# Patient Record
Sex: Male | Born: 1950 | Race: White | Hispanic: No | Marital: Married | Smoking: Former smoker
Health system: Southern US, Community
[De-identification: ages and names within clinical notes are randomized; demographics above are authoritative.]

## PROBLEM LIST (undated history)

## (undated) DIAGNOSIS — M549 Dorsalgia, unspecified: Secondary | ICD-10-CM

## (undated) DIAGNOSIS — R7303 Prediabetes: Secondary | ICD-10-CM

## (undated) DIAGNOSIS — G4730 Sleep apnea, unspecified: Secondary | ICD-10-CM

## (undated) DIAGNOSIS — T884XXA Failed or difficult intubation, initial encounter: Secondary | ICD-10-CM

## (undated) DIAGNOSIS — E785 Hyperlipidemia, unspecified: Secondary | ICD-10-CM

## (undated) DIAGNOSIS — I714 Abdominal aortic aneurysm, without rupture, unspecified: Secondary | ICD-10-CM

## (undated) DIAGNOSIS — M1710 Unilateral primary osteoarthritis, unspecified knee: Secondary | ICD-10-CM

## (undated) DIAGNOSIS — M4800 Spinal stenosis, site unspecified: Secondary | ICD-10-CM

## (undated) DIAGNOSIS — T7840XA Allergy, unspecified, initial encounter: Secondary | ICD-10-CM

## (undated) DIAGNOSIS — R3129 Other microscopic hematuria: Secondary | ICD-10-CM

## (undated) DIAGNOSIS — N400 Enlarged prostate without lower urinary tract symptoms: Secondary | ICD-10-CM

## (undated) DIAGNOSIS — K219 Gastro-esophageal reflux disease without esophagitis: Secondary | ICD-10-CM

## (undated) DIAGNOSIS — R972 Elevated prostate specific antigen [PSA]: Secondary | ICD-10-CM

## (undated) DIAGNOSIS — D472 Monoclonal gammopathy: Secondary | ICD-10-CM

## (undated) DIAGNOSIS — H269 Unspecified cataract: Secondary | ICD-10-CM

## (undated) DIAGNOSIS — D649 Anemia, unspecified: Secondary | ICD-10-CM

## (undated) DIAGNOSIS — G8929 Other chronic pain: Secondary | ICD-10-CM

## (undated) DIAGNOSIS — G5601 Carpal tunnel syndrome, right upper limb: Secondary | ICD-10-CM

## (undated) DIAGNOSIS — R1010 Upper abdominal pain, unspecified: Secondary | ICD-10-CM

## (undated) DIAGNOSIS — I10 Essential (primary) hypertension: Secondary | ICD-10-CM

## (undated) DIAGNOSIS — M1990 Unspecified osteoarthritis, unspecified site: Secondary | ICD-10-CM

## (undated) DIAGNOSIS — J302 Other seasonal allergic rhinitis: Secondary | ICD-10-CM

## (undated) DIAGNOSIS — M179 Osteoarthritis of knee, unspecified: Secondary | ICD-10-CM

## (undated) DIAGNOSIS — J189 Pneumonia, unspecified organism: Secondary | ICD-10-CM

## (undated) DIAGNOSIS — Z789 Other specified health status: Secondary | ICD-10-CM

## (undated) HISTORY — PX: KNEE ARTHROSCOPY: SUR90

## (undated) HISTORY — PX: EYE SURGERY: SHX253

## (undated) HISTORY — DX: Elevated prostate specific antigen (PSA): R97.20

## (undated) HISTORY — PX: LATERAL FUSION LUMBAR SPINE: SUR631

## (undated) HISTORY — DX: Prediabetes: R73.03

## (undated) HISTORY — PX: CATARACT EXTRACTION, BILATERAL: SHX1313

## (undated) HISTORY — PX: CARPAL TUNNEL RELEASE: SHX101

## (undated) HISTORY — DX: Other chronic pain: G89.29

## (undated) HISTORY — PX: JOINT REPLACEMENT: SHX530

## (undated) HISTORY — PX: ELBOW SURGERY: SHX618

## (undated) HISTORY — DX: Hyperlipidemia, unspecified: E78.5

## (undated) HISTORY — DX: Osteoarthritis of knee, unspecified: M17.9

## (undated) HISTORY — PX: TONSILLECTOMY: SUR1361

## (undated) HISTORY — DX: Allergy, unspecified, initial encounter: T78.40XA

## (undated) HISTORY — DX: Unilateral primary osteoarthritis, unspecified knee: M17.10

## (undated) HISTORY — DX: Other microscopic hematuria: R31.29

## (undated) HISTORY — DX: Spinal stenosis, site unspecified: M48.00

## (undated) HISTORY — PX: COLONOSCOPY: SHX174

## (undated) HISTORY — DX: Other specified health status: Z78.9

## (undated) HISTORY — DX: Carpal tunnel syndrome, right upper limb: G56.01

## (undated) HISTORY — DX: Upper abdominal pain, unspecified: R10.10

## (undated) HISTORY — PX: WISDOM TOOTH EXTRACTION: SHX21

## (undated) HISTORY — DX: Other chronic pain: M54.9

## (undated) HISTORY — PX: SHOULDER ARTHROSCOPY: SHX128

## (undated) SURGERY — Surgical Case
Anesthesia: *Unknown

---

## 2008-01-10 HISTORY — PX: COLONOSCOPY: SHX174

## 2008-01-10 HISTORY — PX: OTHER SURGICAL HISTORY: SHX169

## 2008-02-19 ENCOUNTER — Ambulatory Visit (HOSPITAL_BASED_OUTPATIENT_CLINIC_OR_DEPARTMENT_OTHER): Admission: RE | Admit: 2008-02-19 | Discharge: 2008-02-19 | Payer: Self-pay | Admitting: Orthopedic Surgery

## 2008-03-16 ENCOUNTER — Ambulatory Visit (HOSPITAL_BASED_OUTPATIENT_CLINIC_OR_DEPARTMENT_OTHER): Admission: RE | Admit: 2008-03-16 | Discharge: 2008-03-16 | Payer: Self-pay | Admitting: Orthopedic Surgery

## 2008-03-31 ENCOUNTER — Encounter: Admission: RE | Admit: 2008-03-31 | Discharge: 2008-03-31 | Payer: Self-pay | Admitting: Orthopedic Surgery

## 2008-04-15 ENCOUNTER — Ambulatory Visit: Payer: Self-pay | Admitting: Internal Medicine

## 2008-04-24 ENCOUNTER — Encounter: Payer: Self-pay | Admitting: Internal Medicine

## 2008-04-24 ENCOUNTER — Ambulatory Visit: Payer: Self-pay | Admitting: Internal Medicine

## 2008-04-27 ENCOUNTER — Encounter: Payer: Self-pay | Admitting: Internal Medicine

## 2008-08-09 DEATH — deceased

## 2010-04-21 LAB — POCT HEMOGLOBIN-HEMACUE: Hemoglobin: 14.5 g/dL (ref 13.0–17.0)

## 2010-04-26 LAB — POCT HEMOGLOBIN-HEMACUE: Hemoglobin: 14.1 g/dL (ref 13.0–17.0)

## 2010-05-24 NOTE — Op Note (Signed)
NAME:  Walter White, Walter White NO.:  [id number]   MEDICAL RECORD NO.:  [id number]          PATIENT TYPE:  AMB   LOCATION:  DSC                          FACILITY:  [facility]   PHYSICIAN:  Feliberto Gottron. Turner Daniels, M.D.   DATE OF BIRTH:  06/10/1950   DATE OF PROCEDURE:  03/16/2008  DATE OF DISCHARGE:                               OPERATIVE REPORT   PREOPERATIVE DIAGNOSIS:  Right knee medial meniscal tear.   POSTOPERATIVE DIAGNOSES:  1. Right knee medial meniscal tear.  2. Chondromalacia medial femoral condyle, grade 3.   PROCEDURES:  1. Right knee arthroscopic partial medial meniscectomy.  2. Debride chondromalacia, grade 3 from the medial femoral condyle.   SURGEON:  Feliberto Gottron. Turner Daniels, MD   FIRST ASSISTANT:  Shirl [name], PA   ANESTHETIC:  General LMA.   ESTIMATED BLOOD LOSS:  Minimal.   FLUID REPLACEMENT:  800 mL of crystalloid.   DRAINS PLACED:  None.   TOURNIQUET TIME:  None.   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  He has medial joint line pain, catching, popping, and  tenderness.  He had some calcium pyrophosphate crystals on the left  knee, he also has some on the right.  He desires the procedure to  decrease pain and increase function, similar that was done on the left  knee.   DESCRIPTION OF PROCEDURE:  The patient was identified by armband, taken  to the operating room at [facility]  At [facility], appropriate anesthetic monitors were attached and general LMA  anesthesia induced with the patient in supine position.  Lateral post  was applied to the table and the right lower extremity was prepped and  draped in usual sterile fashion from the ankle to the midthigh.  A  standard time-out procedure was then performed.  Using a #11 blade,  standard inferomedial and  inferolateral peripatellar portals were then  made allowing introduction of the arthroscope through the inferolateral  portal and the outflow through the inferomedial portal.  Diagnostic  arthroscopy revealed a relatively normal patellofemoral joint.  A little  bit of grade 2 to grade 3 chondromalacia to the lateral facet of the  patella and this was lightly debrided.  Moving in the medial side, the  patient did have a fairly impressive complex tear of the posterior and  medial horns of the medial meniscus.  This was debrided back to a stable  margin with a 3.5 Gator sucker shaver and a small biter.  We also  identified chondromalacia in the medial femoral condyle, grade 3 and  this was debrided with a 3.5 Gator sucker shaver as well.  The ACL and  PCL were intact on the lateral side, the articular and missing meniscal  cartilages were in excellent condition.  The gutters were cleared  medially and laterally.  The arthroscopic instruments were removed and a  dressing of Xeroform 4 x 4 dressing, sponges, Webril,  and Ace wrap  applied.  The patient was awakened and taken to the recovery room  without difficulty.      Feliberto Gottron. Turner Daniels, M.D.  Electronically Signed     FJR/MEDQ  D:  03/16/2008  T:  03/17/2008  Job:  [id number]

## 2010-05-24 NOTE — Op Note (Signed)
NAME:  Walter White, Walter White NO.:  [id number]   MEDICAL RECORD NO.:  [id number]          PATIENT TYPE:  AMB   LOCATION:  DSC                          FACILITY:  [facility]   PHYSICIAN:  Feliberto Gottron. Turner Daniels, M.D.   DATE OF BIRTH:  1950-07-21   DATE OF PROCEDURE:  02/19/2008  DATE OF DISCHARGE:                               OPERATIVE REPORT   PREOPERATIVE DIAGNOSIS:  Left knee medial meniscal tear.   POSTOPERATIVE DIAGNOSIS:  Left knee medial meniscal tear; chondromalacia  medial femoral condyle, focal grade 3; medial tibial plateau, focal  grade 3; partial anterior cruciate ligament tear with calcifications  laterally; and calcium pyrophosphate in the lateral meniscus as well.   PROCEDURE:  Left knee arthroscopic partial medial meniscectomy; removal  of calcium pyrophosphate deposits from the knee; debridement of partial-  thickness tear, anterior cruciate ligament; and debridement of  chondromalacia medial femoral condyle, focal and medial tibial plateau,  focal grade 3.   SURGEON:  Feliberto Gottron. Turner Daniels, MD   FIRST ASSISTANT:  None.   ANESTHETIC:  General LMA.   ESTIMATED BLOOD LOSS:  Minimal.   FLUID REPLACEMENT:  800 mL of crystalloid.   DRAINS PLACED:  None.   TOURNIQUET TIME:  None.   INDICATIONS FOR PROCEDURE:  A 60 year old gentleman with intermittent  catching, popping, and pain in his left knee with mechanical symptoms  that have gone on for a number of years.  He has failed conservative  measures, desires elective arthroscopic evaluation and treatment of his  left knee.  Risks and benefits of surgery discussed, questions answered.   DESCRIPTION OF PROCEDURE:  The patient was identified by armband and  taken to the operating room at [facility] where the  appropriate anesthetic monitors were attached and general LMA anesthesia  induced with the patient in supine position.  Lateral post applied to  the table in the left lower extremity prepped and  draped in usual  sterile fashion from the ankle to the midthigh.  Standard time-out  procedure was then performed after which we made standard inferomedial  and inferolateral peripatellar portals using a #11 blade, the  arthroscope was introduced to the inferolateral portal and the pump  pressure set between 14 and 80 mmHg.  Diagnostic arthroscopy revealed a  fairly normal suprapatellar pouch, patella and trochlea moving to the  medial side from about the mid medial meniscus to the posterior horn.  There was an extensive complex tearing in the medial meniscus.  This was  debrided with a 3.5 Gator sucker shaver as well as straight small  biters.  We also encountered areas of grade 3 chondromalacia of the  medial femoral condyle and grade medial tibial plateau about 45 mm in  diameter and these were smoothed out and debrided back to stable margins  with the Gator sucker shaver.  We also noted deposits of calcium  pyrophosphate in the posterior horn of the medial meniscus as well as  throughout the ACL and on the lateral side of the ACL about 10% of  fibers were torn, interposed between the lateral femoral condyle,  and  lateral tibial plateau.  These were removed with the 3.5 Gator sucker  shaver as were some calcium deposits in the posterior horn of the  lateral meniscus.  The gutters were cleared medially and laterally.  The  knee irrigated out with normal saline solution and the arthroscopic  instruments removed and a dressing of Xeroform, 4x4 dressing, sponges,  Webril, and Ace wrap were applied.  The patient was then awakened,  extubated, and taken to the recovery room without difficulty.      Feliberto Gottron. Turner Daniels, M.D.  Electronically Signed     FJR/MEDQ  D:  02/19/2008  T:  02/19/2008  Job:  [id number]

## 2010-11-22 ENCOUNTER — Encounter (HOSPITAL_BASED_OUTPATIENT_CLINIC_OR_DEPARTMENT_OTHER): Payer: Self-pay | Admitting: *Deleted

## 2010-11-22 NOTE — Progress Notes (Signed)
Pt deies any ht or resp problems or sleep apnea

## 2010-11-22 NOTE — H&P (Signed)
  HISTORY OF PRESENT ILLNESS:  Mr. Walter White is here for followup of his L Knee.  He saw Dr. Althea Charon on 03 October 2010 and they aspirated out about 115 mL of blood-tinged fluid that had a slightly yellow hue.  This was sent off for a cell count; 33,000 cells, gram stain and culture came back with white cells.  Cultures are negative.  He has had no fevers or chills.  He does have a long history of pseudogout and he has actually had this knee scoped once or twice in the past by me.  PAST MEDICAL HISTORY: MEDICAL PROBLEMS:  Hyperlipidemia, allergic rhinitis, and osteoarthritis of both knees.  PAST SURGICAL HISTORY:  Right knee arthroscopy in 2010 and left knee arthroscopy x2 in 1990 and 2010.   SOCIAL HISTORY:  He is married.  He is a former smoker who quit about 27 years ago.  He drinks alcohol rarely.  REVIEW OF SYSTEMS: Review of systems reviewed thoroughly and all other systems are negative as related to the chief complaint.  EXAM:  He has re-accumulated part of his effusion.  His range of motion is about 5 to 120 degrees.  Collateral ligaments are stable.  Generally he is a well-nourished, well-developed 60 year old gentleman seated on the exam table in no apparent distress.  No shortness of breath.  His skin is intact. The final labs have been reviewed with the patient.  Options were discussed at length.  RADIOGRAPHS:  Other reviewed images:  Reviewing his past x-rays he does have pseudogout with calcium phosphate crystals in the cartilages that you can visualize on the x-ray.  IMPRESSION/PROCEDURE:  After discussing his options I went ahead and sterilely prepped his left knee, aspirated out 30 mL of fairly normal-appearing joint fluid with a little bit of fibrinous material in the aspirate.  I injected him with 3 mL of Xylocaine, 3 mL of Marcaine, and 3 mL of Celestone. Excellent pain relief within 5 minutes.  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 Risk\Benefits discussed.

## 2010-11-23 ENCOUNTER — Ambulatory Visit (HOSPITAL_BASED_OUTPATIENT_CLINIC_OR_DEPARTMENT_OTHER): Payer: BC Managed Care – PPO | Admitting: Anesthesiology

## 2010-11-23 ENCOUNTER — Encounter (HOSPITAL_BASED_OUTPATIENT_CLINIC_OR_DEPARTMENT_OTHER): Payer: Self-pay | Admitting: Anesthesiology

## 2010-11-23 ENCOUNTER — Other Ambulatory Visit: Payer: Self-pay

## 2010-11-23 ENCOUNTER — Encounter (HOSPITAL_BASED_OUTPATIENT_CLINIC_OR_DEPARTMENT_OTHER)
Admission: RE | Disposition: A | Discharge status: Home / Self Care | Payer: Self-pay | Source: Ambulatory Visit | Attending: Orthopedic Surgery

## 2010-11-23 ENCOUNTER — Ambulatory Visit (HOSPITAL_BASED_OUTPATIENT_CLINIC_OR_DEPARTMENT_OTHER)
Admission: RE | Admit: 2010-11-23 | Discharge: 2010-11-23 | Disposition: A | Discharge status: Home / Self Care | Payer: BC Managed Care – PPO | Source: Ambulatory Visit | Attending: Orthopedic Surgery | Admitting: Orthopedic Surgery

## 2010-11-23 ENCOUNTER — Encounter (HOSPITAL_BASED_OUTPATIENT_CLINIC_OR_DEPARTMENT_OTHER): Payer: Self-pay | Admitting: *Deleted

## 2010-11-23 DIAGNOSIS — M23305 Other meniscus derangements, unspecified medial meniscus, unspecified knee: Secondary | ICD-10-CM | POA: Insufficient documentation

## 2010-11-23 DIAGNOSIS — S83242A Other tear of medial meniscus, current injury, left knee, initial encounter: Secondary | ICD-10-CM

## 2010-11-23 DIAGNOSIS — M9420 Chondromalacia, unspecified site: Secondary | ICD-10-CM | POA: Insufficient documentation

## 2010-11-23 DIAGNOSIS — M2340 Loose body in knee, unspecified knee: Secondary | ICD-10-CM | POA: Insufficient documentation

## 2010-11-23 HISTORY — DX: Unspecified osteoarthritis, unspecified site: M19.90

## 2010-11-23 HISTORY — PX: KNEE ARTHROSCOPY: SHX127

## 2010-11-23 LAB — POCT HEMOGLOBIN-HEMACUE: Hemoglobin: 13.1 g/dL (ref 13.0–17.0)

## 2010-11-23 SURGERY — ARTHROSCOPY, KNEE
Anesthesia: Choice | Laterality: Left

## 2010-11-23 MED ORDER — MIDAZOLAM HCL 5 MG/5ML IJ SOLN
INTRAMUSCULAR | Status: DC | PRN
  Administered 2010-11-23: 2 mg via INTRAVENOUS

## 2010-11-23 MED ORDER — BUPIVACAINE HCL (PF) 0.5 % IJ SOLN
INTRAMUSCULAR | Status: DC | PRN
  Administered 2010-11-23: 20 mL

## 2010-11-23 MED ORDER — SODIUM CHLORIDE 0.9 % IR SOLN
Status: DC | PRN
  Administered 2010-11-23: 3000 mL

## 2010-11-23 MED ORDER — EPINEPHRINE HCL 1 MG/ML IJ SOLN
INTRAMUSCULAR | Status: DC | PRN
  Administered 2010-11-23: 1 mL

## 2010-11-23 MED ORDER — DEXAMETHASONE SODIUM PHOSPHATE 4 MG/ML IJ SOLN
INTRAMUSCULAR | Status: DC | PRN
  Administered 2010-11-23: 10 mg via INTRAVENOUS

## 2010-11-23 MED ORDER — LACTATED RINGERS IV SOLN
INTRAVENOUS | Status: DC
  Administered 2010-11-23: 11:00:00 via INTRAVENOUS

## 2010-11-23 MED ORDER — FENTANYL CITRATE 0.05 MG/ML IJ SOLN
INTRAMUSCULAR | Status: DC | PRN
  Administered 2010-11-23: 100 ug via INTRAVENOUS

## 2010-11-23 MED ORDER — CHLORHEXIDINE GLUCONATE 4 % EX LIQD: 60.0000 mL | Freq: Once | CUTANEOUS | Status: DC

## 2010-11-23 MED ORDER — ONDANSETRON HCL 4 MG/2ML IJ SOLN
INTRAMUSCULAR | Status: DC | PRN
  Administered 2010-11-23: 4 mg via INTRAVENOUS

## 2010-11-23 MED ORDER — HYDROCODONE-ACETAMINOPHEN 5-325 MG PO TABS: 1.0000 | ORAL_TABLET | ORAL | Status: AC | PRN

## 2010-11-23 MED ORDER — DEXTROSE-NACL 5-0.45 % IV SOLN: INTRAVENOUS | Status: DC

## 2010-11-23 MED ORDER — LIDOCAINE HCL (CARDIAC) 20 MG/ML IV SOLN
INTRAVENOUS | Status: DC | PRN
  Administered 2010-11-23 (×2): 60 mg via INTRAVENOUS

## 2010-11-23 MED ORDER — VANCOMYCIN HCL IN DEXTROSE 1-5 GM/200ML-% IV SOLN
1000.0000 mg | INTRAVENOUS | Status: AC
  Administered 2010-11-23: 1000 mg via INTRAVENOUS

## 2010-11-23 MED ORDER — PROPOFOL 10 MG/ML IV EMUL
INTRAVENOUS | Status: DC | PRN
  Administered 2010-11-23: 200 mg via INTRAVENOUS

## 2010-11-23 SURGICAL SUPPLY — 36 items
BLADE 4.2CUDA (BLADE) IMPLANT
BLADE CUTTER GATOR 3.5 (BLADE) ×2 IMPLANT
BLADE GREAT WHITE 4.2 (BLADE) ×2 IMPLANT
CANISTER OMNI JUG 16 LITER (MISCELLANEOUS) ×2 IMPLANT
CANISTER SUCTION 2500CC (MISCELLANEOUS) IMPLANT
CHLORAPREP W/TINT 26ML (MISCELLANEOUS) ×2 IMPLANT
CLOTH BEACON ORANGE TIMEOUT ST (SAFETY) ×2 IMPLANT
DRAPE ARTHROSCOPY W/POUCH 114 (DRAPES) ×2 IMPLANT
ELECT MENISCUS 165MM 90D (ELECTRODE) IMPLANT
ELECT REM PT RETURN 9FT ADLT (ELECTROSURGICAL)
ELECTRODE REM PT RTRN 9FT ADLT (ELECTROSURGICAL) IMPLANT
GAUZE XEROFORM 1X8 LF (GAUZE/BANDAGES/DRESSINGS) ×2 IMPLANT
GLOVE BIO SURGEON STRL SZ7 (GLOVE) ×2 IMPLANT
GLOVE BIO SURGEON STRL SZ7.5 (GLOVE) ×2 IMPLANT
GLOVE BIOGEL PI IND STRL 7.0 (GLOVE) ×1 IMPLANT
GLOVE BIOGEL PI IND STRL 8 (GLOVE) ×1 IMPLANT
GLOVE BIOGEL PI INDICATOR 7.0 (GLOVE) ×1
GLOVE BIOGEL PI INDICATOR 8 (GLOVE) ×1
GOWN PREVENTION PLUS XLARGE (GOWN DISPOSABLE) ×6 IMPLANT
KNEE WRAP E Z 3 GEL PACK (MISCELLANEOUS) ×2 IMPLANT
NDL SAFETY ECLIPSE 18X1.5 (NEEDLE) ×1 IMPLANT
NEEDLE FILTER BLUNT 18X 1/2SAF (NEEDLE)
NEEDLE FILTER BLUNT 18X1 1/2 (NEEDLE) IMPLANT
NEEDLE HYPO 18GX1.5 SHARP (NEEDLE) ×1
PACK ARTHROSCOPY DSU (CUSTOM PROCEDURE TRAY) ×2 IMPLANT
PACK BASIN DAY SURGERY FS (CUSTOM PROCEDURE TRAY) ×2 IMPLANT
PENCIL BUTTON HOLSTER BLD 10FT (ELECTRODE) IMPLANT
SET ARTHROSCOPY TUBING (MISCELLANEOUS) ×1
SET ARTHROSCOPY TUBING LN (MISCELLANEOUS) ×1 IMPLANT
SLEEVE SCD COMPRESS KNEE MED (MISCELLANEOUS) IMPLANT
SPONGE GAUZE 4X4 12PLY (GAUZE/BANDAGES/DRESSINGS) ×2 IMPLANT
SYR 3ML 18GX1 1/2 (SYRINGE) IMPLANT
SYR 5ML LL (SYRINGE) ×2 IMPLANT
TOWEL OR 17X24 6PK STRL BLUE (TOWEL DISPOSABLE) ×2 IMPLANT
WAND STAR VAC 90 (SURGICAL WAND) IMPLANT
WATER STERILE IRR 1000ML POUR (IV SOLUTION) ×2 IMPLANT

## 2010-11-23 NOTE — Anesthesia Postprocedure Evaluation (Signed)
  Anesthesia Post-op Note  Patient: Walter White  Procedure(s) Performed:  ARTHROSCOPY KNEE - arthroscopy left knee  Patient Location: PACU  Anesthesia Type: General  Level of Consciousness: awake  Airway and Oxygen Therapy: Patient Spontanous Breathing  Post-op Pain: mild  Post-op Assessment: Post-op Vital signs reviewed, Patient's Cardiovascular Status Stable, Respiratory Function Stable, Patent Airway, No signs of Nausea or vomiting, Adequate PO intake and Pain level controlled  Post-op Vital Signs: Reviewed and stable  Complications: No apparent anesthesia complications

## 2010-11-23 NOTE — Interval H&P Note (Signed)
History and Physical Interval Note:   11/23/2010   11:34 AM   Walter White  has presented today for surgery, with the diagnosis of left knee mmt  The various methods of treatment have been discussed with the patient and family. After consideration of risks, benefits and other options for treatment, the patient has consented to  Procedure(s): ARTHROSCOPY KNEE as a surgical intervention .  The patients' history has been reviewed, patient examined, no change in status, stable for surgery.  I have reviewed the patients' chart and labs.  Questions were answered to the patient's satisfaction.     Nestor Lewandowsky  MD

## 2010-11-23 NOTE — Anesthesia Preprocedure Evaluation (Addendum)
Anesthesia Evaluation  Patient identified by MRN, date of birth, ID band Patient awake    Reviewed: Allergy & Precautions, H&P , NPO status , Patient's Chart, lab work & pertinent test results  Airway Mallampati: I TM Distance: >3 FB Neck ROM: Full    Dental No notable dental hx.    Pulmonary    Pulmonary exam normal       Cardiovascular     Neuro/Psych    GI/Hepatic   Endo/Other  Morbid obesity  Renal/GU      Musculoskeletal   Abdominal   Peds  Hematology   Anesthesia Other Findings   Reproductive/Obstetrics                           Anesthesia Physical Anesthesia Plan  ASA: III  Anesthesia Plan: General   Post-op Pain Management:    Induction: Intravenous  Airway Management Planned: LMA  Additional Equipment:   Intra-op Plan:   Post-operative Plan: Extubation in OR  Informed Consent: I have reviewed the patients History and Physical, chart, labs and discussed the procedure including the risks, benefits and alternatives for the proposed anesthesia with the patient or authorized representative who has indicated his/her understanding and acceptance.   Dental advisory given  Plan Discussed with: CRNA and Surgeon  Anesthesia Plan Comments:         Anesthesia Quick Evaluation

## 2010-11-23 NOTE — Op Note (Signed)
NAME:  Walter White, Walter White NO.:  [id number]  MEDICAL RECORD NO.:  [id number]  LOCATION:                                 FACILITY:  PHYSICIAN:  Feliberto Gottron. Turner Daniels, M.D.        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  PROCEDURE:  Left knee arthroscopic partial medial meniscectomy, removal of calcium pyrophosphate crystals, removal of loose bodies, and debridement of chondromalacia.  SURGEON:  Feliberto Gottron. Turner Daniels, MD  FIRST ASSISTANT:  Shirl [name], PA-C  ANESTHETIC:  General LMA.  ESTIMATED BLOOD LOSS:  Minimal.  FLUID REPLACEMENT:  1500 mL of crystalloid.  DRAINS PLACED:  none.  TOURNIQUET TIME:  None.  INDICATIONS FOR PROCEDURE:  The patient is a 60 year old man with catching, popping, and pain in his left knee.  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  He also has had intermittent effusions and as recently a month ago we took up 120 mL of bloody effusion that was culture negative.  He desires elective arthroscopic evaluation treatment of his left knee.  Risks and benefits of surgery were discussed.  Questions answered and he has previously had this procedure done before.  DESCRIPTION OF PROCEDURE:  The patient identified by armband and received preoperative IV antibiotics in the holding area at the [facility].  Taken to operating room 5, appropriate anesthetic monitors were attached.  General LMA anesthesia induced with the patient in a supine position.  The lateral post applied to the table.  Left lower extremity prepped and  draped in sterile fashion from the ankle to the midthigh.  At this point, time-out procedure was performed.  We began the operation by making standard inferomedial and inferolateral peripatellar portals allowing introduction of the arthroscope through the inferolateral portal and the outflow through the inferomedial portal.  Pump pressure was set at 100 mm and diagnostic arthroscopy did reveal intraarticular cartilages as well as calcified loose bodies that were taken through the outflow or with a 3.5 or 4.2 great White sucker shaver.  The patellofemoral joint was in relatively good condition moving to the medial compartment.  Complex degenerative tearing of the medial meniscus was identified.  Started with calcium pyrophosphate crystals.  This was debrided back to a stable margin and thoroughly probed.  The articular cartilage of the medial femoral condyle and medial tibial plateau was in relatively good condition.  The ACL was in relatively good condition as well.  On the lateral compartment, some small loose bodies were found, otherwise this was also in good condition.  The knee was irrigated out with normal saline solution. Arthroscopic instruments were removed.  Dressing of Xeroform, 4 x 4, dressing, sponges, Webril, and Ace wrap applied.  The patient awakened, extubated, and taken to the recovery room without difficulty.  Feliberto Gottron. Turner Daniels, M.D.     Ovid Curd  D:  11/23/2010  T:  11/23/2010  Job:  [id number]

## 2010-11-23 NOTE — Brief Op Note (Signed)
11/23/2010  12:35 PM  PATIENT:  Walter White  60 y.o. male  PRE-OPERATIVE DIAGNOSIS:  left knee mmt  POST-OPERATIVE DIAGNOSIS:  Left Knee Medial Meniscal Tear  PROCEDURE:  Procedure(s): ARTHROSCOPY KNEE  SURGEON:  Surgeon(s): Nestor Lewandowsky  PHYSICIAN ASSISTANT: Mauricia Area  ANESTHESIA:   general  EBL:  Total I/O In: 800 [I.V.:800] Out: -   BLOOD ADMINISTERED:none  DRAINS: none   LOCAL MEDICATIONS USED:  MARCAINE 10CC  SPECIMEN:  No Specimen  DISPOSITION OF SPECIMEN:  N/A  COUNTS:  YES  TOURNIQUET:  * No tourniquets in log *  DICTATION: .Other Dictation: Dictation Number unknown  PLAN OF CARE: Discharge to home after PACU  PATIENT DISPOSITION:  PACU - hemodynamically stable.   Delay start of Pharmacological VTE agent (>24hrs) due to surgical blood loss or risk of bleeding:  {YES/NO/NOT APPLICABLE:20182

## 2010-11-23 NOTE — Transfer of Care (Signed)
Immediate Anesthesia Transfer of Care Note  Patient: Walter White  Procedure(s) Performed:  ARTHROSCOPY KNEE - arthroscopy left knee  Patient Location: PACU  Anesthesia Type: General  Level of Consciousness: sedated  Airway & Oxygen Therapy: Patient Spontanous Breathing and Patient connected to face mask oxygen  Post-op Assessment: Report given to PACU RN and Post -op Vital signs reviewed and stable  Post vital signs: Reviewed and stable  Complications: No apparent anesthesia complications

## 2010-11-23 NOTE — Op Note (Signed)
Op Note Dictated 11/23/10

## 2010-11-23 NOTE — Anesthesia Procedure Notes (Addendum)
Performed by: Signa Kell   Procedure Name: LMA Insertion Date/Time: 11/23/2010 11:52 AM Performed by: Signa Kell Pre-anesthesia Checklist: Patient identified, Emergency Drugs available, Suction available and Patient being monitored Patient Re-evaluated:Patient Re-evaluated prior to inductionOxygen Delivery Method: Circle System Utilized Preoxygenation: Pre-oxygenation with 100% oxygen Intubation Type: IV induction Ventilation: Mask ventilation without difficulty LMA: LMA with gastric port inserted LMA Size: 5.0 Number of attempts: 1 Placement Confirmation: positive ETCO2 Tube secured with: Tape Dental Injury: Teeth and Oropharynx as per pre-operative assessment

## 2010-11-29 ENCOUNTER — Encounter (HOSPITAL_BASED_OUTPATIENT_CLINIC_OR_DEPARTMENT_OTHER): Payer: Self-pay | Admitting: Orthopedic Surgery

## 2011-04-25 ENCOUNTER — Encounter: Payer: Self-pay | Admitting: Internal Medicine

## 2011-04-26 ENCOUNTER — Encounter: Payer: Self-pay | Admitting: Internal Medicine

## 2011-06-16 ENCOUNTER — Ambulatory Visit (AMBULATORY_SURGERY_CENTER): Payer: BC Managed Care – PPO | Admitting: *Deleted

## 2011-06-16 ENCOUNTER — Encounter: Payer: Self-pay | Admitting: Internal Medicine

## 2011-06-16 VITALS — Ht 67.0 in | Wt 248.0 lb

## 2011-06-16 DIAGNOSIS — Z1211 Encounter for screening for malignant neoplasm of colon: Secondary | ICD-10-CM

## 2011-06-16 MED ORDER — MOVIPREP 100 G PO SOLR: ORAL | Status: DC

## 2011-06-30 ENCOUNTER — Encounter: Payer: Self-pay | Admitting: Internal Medicine

## 2011-06-30 ENCOUNTER — Ambulatory Visit (AMBULATORY_SURGERY_CENTER): Payer: BC Managed Care – PPO | Admitting: Internal Medicine

## 2011-06-30 VITALS — BP 125/79 | HR 75 | Temp 97.9°F | Resp 20 | Ht 67.0 in | Wt 248.0 lb

## 2011-06-30 DIAGNOSIS — Z1211 Encounter for screening for malignant neoplasm of colon: Secondary | ICD-10-CM

## 2011-06-30 DIAGNOSIS — Z860100 Personal history of colon polyps, unspecified: Secondary | ICD-10-CM

## 2011-06-30 DIAGNOSIS — D126 Benign neoplasm of colon, unspecified: Secondary | ICD-10-CM

## 2011-06-30 MED ORDER — SODIUM CHLORIDE 0.9 % IV SOLN: 500.0000 mL | INTRAVENOUS | Status: DC

## 2011-06-30 NOTE — Patient Instructions (Addendum)

## 2011-06-30 NOTE — Progress Notes (Signed)
Patient did not experience any of the following events: a burn prior to discharge; a fall within the facility; wrong site/side/patient/procedure/implant event; or a hospital transfer or hospital admission upon discharge from the facility. (G8907) Patient did not have preoperative order for IV antibiotic SSI prophylaxis. (G8918)  

## 2011-06-30 NOTE — Op Note (Signed)
[facility] [address] [place], [place]  [address]  COLONOSCOPY PROCEDURE REPORT  PATIENT:  Walter White, Walter White  MR#:  [id number] BIRTHDATE:  1950-08-07, 60 yrs. old  GENDER:  male ENDOSCOPIST:  Wilhemina Bonito. Eda Keys, MD REF. BY:  Surveillance Program Recall, PROCEDURE DATE:  06/30/2011 PROCEDURE:  Colonoscopy with snare polypectomy x 4 ASA CLASS:  Class II INDICATIONS:  history of pre-cancerous (adenomatous) colon polyps, surveillance and high-risk screening ; INDEX 04-2008 W/ MULTIPLE SMALL ADENOMAS MEDICATIONS:   MAC sedation, administered by CRNA, propofol (Diprivan) 300 mg IV  DESCRIPTION OF PROCEDURE:   After the risks benefits and alternatives of the procedure were thoroughly explained, informed consent was obtained.  Digital rectal exam was performed and revealed no abnormalities.   The LB CF-[id number] endoscope was introduced through the anus and advanced to the cecum, which was identified by both the appendix and ileocecal valve, without limitations.  The quality of the prep was adequate, using MoviPrep.  The instrument was then slowly withdrawn as the colon was fully examined. <<PROCEDUREIMAGES>>  FINDINGS:  Four polyps, all < 6mm, were found in the ascending (2) and transverse (2) colon. Polyps were snared without cautery. Retrieval was successful.  Otherwise normal colonoscopy without other polyps, masses, vascular ectasias, or inflammatory changes. Retroflexed views in the rectum revealed no abnormalities.    The time to cecum =  3:09  minutes. The scope was then withdrawn in 16:22  minutes from the cecum and the procedure completed.  COMPLICATIONS:  None  ENDOSCOPIC IMPRESSION: 1) Four polyps in the  colon - removed 2) Otherwise normal colonoscopy  RECOMMENDATIONS: 1) Repeat Colonoscopy in 3 years.  ______________________________ Wilhemina Bonito. Eda Keys, MD  CC:  Thayer Headings, MD;  The Patient  n. eSIGNED:   Wilhemina Bonito. Eda Keys at 06/30/2011 10:18  AM  Elsie Saas, [phone]

## 2011-07-03 ENCOUNTER — Telehealth: Payer: Self-pay | Admitting: *Deleted

## 2011-07-03 NOTE — Telephone Encounter (Signed)
No answer, left message to call if questions or concerns. 

## 2011-07-05 ENCOUNTER — Encounter: Payer: Self-pay | Admitting: Internal Medicine

## 2011-08-09 ENCOUNTER — Other Ambulatory Visit: Payer: Self-pay

## 2013-07-17 DIAGNOSIS — M48061 Spinal stenosis, lumbar region without neurogenic claudication: Secondary | ICD-10-CM | POA: Diagnosis not present

## 2013-07-30 DIAGNOSIS — M25819 Other specified joint disorders, unspecified shoulder: Secondary | ICD-10-CM | POA: Diagnosis not present

## 2013-07-30 DIAGNOSIS — M7710 Lateral epicondylitis, unspecified elbow: Secondary | ICD-10-CM | POA: Diagnosis not present

## 2013-09-23 DIAGNOSIS — M7520 Bicipital tendinitis, unspecified shoulder: Secondary | ICD-10-CM | POA: Diagnosis not present

## 2013-09-23 DIAGNOSIS — G5620 Lesion of ulnar nerve, unspecified upper limb: Secondary | ICD-10-CM | POA: Diagnosis not present

## 2013-10-01 DIAGNOSIS — L570 Actinic keratosis: Secondary | ICD-10-CM | POA: Diagnosis not present

## 2013-10-01 DIAGNOSIS — L219 Seborrheic dermatitis, unspecified: Secondary | ICD-10-CM | POA: Diagnosis not present

## 2013-10-01 DIAGNOSIS — D235 Other benign neoplasm of skin of trunk: Secondary | ICD-10-CM | POA: Diagnosis not present

## 2013-10-28 DIAGNOSIS — M47816 Spondylosis without myelopathy or radiculopathy, lumbar region: Secondary | ICD-10-CM | POA: Diagnosis not present

## 2013-10-29 DIAGNOSIS — R972 Elevated prostate specific antigen [PSA]: Secondary | ICD-10-CM | POA: Diagnosis not present

## 2013-11-06 DIAGNOSIS — R972 Elevated prostate specific antigen [PSA]: Secondary | ICD-10-CM | POA: Diagnosis not present

## 2013-11-06 DIAGNOSIS — N411 Chronic prostatitis: Secondary | ICD-10-CM | POA: Diagnosis not present

## 2013-12-19 DIAGNOSIS — M7521 Bicipital tendinitis, right shoulder: Secondary | ICD-10-CM | POA: Diagnosis not present

## 2013-12-19 DIAGNOSIS — M89711 Major osseous defect, right shoulder region: Secondary | ICD-10-CM | POA: Diagnosis not present

## 2013-12-19 DIAGNOSIS — M7522 Bicipital tendinitis, left shoulder: Secondary | ICD-10-CM | POA: Diagnosis not present

## 2013-12-19 DIAGNOSIS — M7542 Impingement syndrome of left shoulder: Secondary | ICD-10-CM | POA: Diagnosis not present

## 2013-12-19 DIAGNOSIS — G5622 Lesion of ulnar nerve, left upper limb: Secondary | ICD-10-CM | POA: Diagnosis not present

## 2013-12-19 DIAGNOSIS — G8918 Other acute postprocedural pain: Secondary | ICD-10-CM | POA: Diagnosis not present

## 2013-12-25 DIAGNOSIS — M1712 Unilateral primary osteoarthritis, left knee: Secondary | ICD-10-CM | POA: Diagnosis not present

## 2013-12-25 DIAGNOSIS — M7522 Bicipital tendinitis, left shoulder: Secondary | ICD-10-CM | POA: Diagnosis not present

## 2013-12-25 DIAGNOSIS — Z9889 Other specified postprocedural states: Secondary | ICD-10-CM | POA: Diagnosis not present

## 2013-12-25 DIAGNOSIS — G5621 Lesion of ulnar nerve, right upper limb: Secondary | ICD-10-CM | POA: Diagnosis not present

## 2014-01-07 DIAGNOSIS — M1712 Unilateral primary osteoarthritis, left knee: Secondary | ICD-10-CM | POA: Diagnosis not present

## 2014-01-14 DIAGNOSIS — M1712 Unilateral primary osteoarthritis, left knee: Secondary | ICD-10-CM | POA: Diagnosis not present

## 2014-01-21 DIAGNOSIS — M1712 Unilateral primary osteoarthritis, left knee: Secondary | ICD-10-CM | POA: Diagnosis not present

## 2014-01-28 DIAGNOSIS — M1712 Unilateral primary osteoarthritis, left knee: Secondary | ICD-10-CM | POA: Diagnosis not present

## 2014-01-28 DIAGNOSIS — Z23 Encounter for immunization: Secondary | ICD-10-CM | POA: Diagnosis not present

## 2014-02-05 DIAGNOSIS — M4806 Spinal stenosis, lumbar region: Secondary | ICD-10-CM | POA: Diagnosis not present

## 2014-02-20 DIAGNOSIS — Z23 Encounter for immunization: Secondary | ICD-10-CM | POA: Diagnosis not present

## 2014-02-25 DIAGNOSIS — M545 Low back pain: Secondary | ICD-10-CM | POA: Diagnosis not present

## 2014-04-06 DIAGNOSIS — M545 Low back pain: Secondary | ICD-10-CM | POA: Diagnosis not present

## 2014-04-06 DIAGNOSIS — M4806 Spinal stenosis, lumbar region: Secondary | ICD-10-CM | POA: Diagnosis not present

## 2014-04-08 DIAGNOSIS — M4806 Spinal stenosis, lumbar region: Secondary | ICD-10-CM | POA: Diagnosis not present

## 2014-04-08 DIAGNOSIS — M545 Low back pain: Secondary | ICD-10-CM | POA: Diagnosis not present

## 2014-04-13 DIAGNOSIS — M545 Low back pain: Secondary | ICD-10-CM | POA: Diagnosis not present

## 2014-04-13 DIAGNOSIS — M4806 Spinal stenosis, lumbar region: Secondary | ICD-10-CM | POA: Diagnosis not present

## 2014-04-14 DIAGNOSIS — M7541 Impingement syndrome of right shoulder: Secondary | ICD-10-CM | POA: Diagnosis not present

## 2014-04-14 DIAGNOSIS — M25521 Pain in right elbow: Secondary | ICD-10-CM | POA: Diagnosis not present

## 2014-04-14 DIAGNOSIS — M1711 Unilateral primary osteoarthritis, right knee: Secondary | ICD-10-CM | POA: Diagnosis not present

## 2014-04-15 DIAGNOSIS — M545 Low back pain: Secondary | ICD-10-CM | POA: Diagnosis not present

## 2014-04-15 DIAGNOSIS — M4806 Spinal stenosis, lumbar region: Secondary | ICD-10-CM | POA: Diagnosis not present

## 2014-04-20 DIAGNOSIS — M4806 Spinal stenosis, lumbar region: Secondary | ICD-10-CM | POA: Diagnosis not present

## 2014-04-20 DIAGNOSIS — M545 Low back pain: Secondary | ICD-10-CM | POA: Diagnosis not present

## 2014-04-21 DIAGNOSIS — M1711 Unilateral primary osteoarthritis, right knee: Secondary | ICD-10-CM | POA: Diagnosis not present

## 2014-04-21 DIAGNOSIS — M1712 Unilateral primary osteoarthritis, left knee: Secondary | ICD-10-CM | POA: Diagnosis not present

## 2014-04-22 DIAGNOSIS — M545 Low back pain: Secondary | ICD-10-CM | POA: Diagnosis not present

## 2014-04-22 DIAGNOSIS — M4806 Spinal stenosis, lumbar region: Secondary | ICD-10-CM | POA: Diagnosis not present

## 2014-04-27 DIAGNOSIS — M545 Low back pain: Secondary | ICD-10-CM | POA: Diagnosis not present

## 2014-04-27 DIAGNOSIS — M4806 Spinal stenosis, lumbar region: Secondary | ICD-10-CM | POA: Diagnosis not present

## 2014-04-28 DIAGNOSIS — M1711 Unilateral primary osteoarthritis, right knee: Secondary | ICD-10-CM | POA: Diagnosis not present

## 2014-04-29 DIAGNOSIS — M545 Low back pain: Secondary | ICD-10-CM | POA: Diagnosis not present

## 2014-04-29 DIAGNOSIS — M4806 Spinal stenosis, lumbar region: Secondary | ICD-10-CM | POA: Diagnosis not present

## 2014-05-04 DIAGNOSIS — M545 Low back pain: Secondary | ICD-10-CM | POA: Diagnosis not present

## 2014-05-04 DIAGNOSIS — M4806 Spinal stenosis, lumbar region: Secondary | ICD-10-CM | POA: Diagnosis not present

## 2014-05-05 DIAGNOSIS — M1711 Unilateral primary osteoarthritis, right knee: Secondary | ICD-10-CM | POA: Diagnosis not present

## 2014-05-06 DIAGNOSIS — R972 Elevated prostate specific antigen [PSA]: Secondary | ICD-10-CM | POA: Diagnosis not present

## 2014-05-06 DIAGNOSIS — M545 Low back pain: Secondary | ICD-10-CM | POA: Diagnosis not present

## 2014-05-06 DIAGNOSIS — M4806 Spinal stenosis, lumbar region: Secondary | ICD-10-CM | POA: Diagnosis not present

## 2014-05-12 DIAGNOSIS — M1712 Unilateral primary osteoarthritis, left knee: Secondary | ICD-10-CM | POA: Diagnosis not present

## 2014-05-12 DIAGNOSIS — M1711 Unilateral primary osteoarthritis, right knee: Secondary | ICD-10-CM | POA: Diagnosis not present

## 2014-05-13 DIAGNOSIS — N138 Other obstructive and reflux uropathy: Secondary | ICD-10-CM | POA: Diagnosis not present

## 2014-05-13 DIAGNOSIS — N401 Enlarged prostate with lower urinary tract symptoms: Secondary | ICD-10-CM | POA: Diagnosis not present

## 2014-05-13 DIAGNOSIS — R972 Elevated prostate specific antigen [PSA]: Secondary | ICD-10-CM | POA: Diagnosis not present

## 2014-06-09 DIAGNOSIS — M47816 Spondylosis without myelopathy or radiculopathy, lumbar region: Secondary | ICD-10-CM | POA: Diagnosis not present

## 2014-06-11 ENCOUNTER — Encounter: Payer: Self-pay | Admitting: Internal Medicine

## 2014-07-02 DIAGNOSIS — M4806 Spinal stenosis, lumbar region: Secondary | ICD-10-CM | POA: Diagnosis not present

## 2014-07-02 DIAGNOSIS — M7711 Lateral epicondylitis, right elbow: Secondary | ICD-10-CM | POA: Diagnosis not present

## 2014-07-08 DIAGNOSIS — M545 Low back pain: Secondary | ICD-10-CM | POA: Diagnosis not present

## 2014-07-10 ENCOUNTER — Other Ambulatory Visit (HOSPITAL_COMMUNITY): Payer: Self-pay | Admitting: Orthopedic Surgery

## 2014-07-10 ENCOUNTER — Ambulatory Visit (HOSPITAL_COMMUNITY)
Admission: RE | Admit: 2014-07-10 | Discharge: 2014-07-10 | Disposition: A | Discharge status: Home / Self Care | Payer: Medicare Other | Source: Ambulatory Visit | Attending: Orthopedic Surgery | Admitting: Orthopedic Surgery

## 2014-07-10 DIAGNOSIS — M25531 Pain in right wrist: Secondary | ICD-10-CM | POA: Diagnosis not present

## 2014-07-10 DIAGNOSIS — M11231 Other chondrocalcinosis, right wrist: Secondary | ICD-10-CM | POA: Diagnosis not present

## 2014-07-10 DIAGNOSIS — W298XXA Contact with other powered powered hand tools and household machinery, initial encounter: Secondary | ICD-10-CM | POA: Diagnosis not present

## 2014-07-10 DIAGNOSIS — S6991XA Unspecified injury of right wrist, hand and finger(s), initial encounter: Secondary | ICD-10-CM | POA: Diagnosis not present

## 2014-07-14 DIAGNOSIS — M25531 Pain in right wrist: Secondary | ICD-10-CM | POA: Diagnosis not present

## 2014-07-14 DIAGNOSIS — M7711 Lateral epicondylitis, right elbow: Secondary | ICD-10-CM | POA: Diagnosis not present

## 2014-07-16 DIAGNOSIS — M4806 Spinal stenosis, lumbar region: Secondary | ICD-10-CM | POA: Diagnosis not present

## 2014-07-16 DIAGNOSIS — M25531 Pain in right wrist: Secondary | ICD-10-CM | POA: Diagnosis not present

## 2014-07-16 DIAGNOSIS — S63501D Unspecified sprain of right wrist, subsequent encounter: Secondary | ICD-10-CM | POA: Diagnosis not present

## 2014-07-16 DIAGNOSIS — M5416 Radiculopathy, lumbar region: Secondary | ICD-10-CM | POA: Diagnosis not present

## 2014-07-28 DIAGNOSIS — M4806 Spinal stenosis, lumbar region: Secondary | ICD-10-CM | POA: Diagnosis not present

## 2014-07-29 DIAGNOSIS — M4806 Spinal stenosis, lumbar region: Secondary | ICD-10-CM | POA: Diagnosis not present

## 2014-07-29 DIAGNOSIS — M25531 Pain in right wrist: Secondary | ICD-10-CM | POA: Diagnosis not present

## 2014-07-29 DIAGNOSIS — S63501D Unspecified sprain of right wrist, subsequent encounter: Secondary | ICD-10-CM | POA: Diagnosis not present

## 2014-07-29 DIAGNOSIS — M5416 Radiculopathy, lumbar region: Secondary | ICD-10-CM | POA: Diagnosis not present

## 2014-08-03 DIAGNOSIS — M4806 Spinal stenosis, lumbar region: Secondary | ICD-10-CM | POA: Diagnosis not present

## 2014-08-03 DIAGNOSIS — M25531 Pain in right wrist: Secondary | ICD-10-CM | POA: Diagnosis not present

## 2014-08-03 DIAGNOSIS — M5416 Radiculopathy, lumbar region: Secondary | ICD-10-CM | POA: Diagnosis not present

## 2014-08-03 DIAGNOSIS — S63501D Unspecified sprain of right wrist, subsequent encounter: Secondary | ICD-10-CM | POA: Diagnosis not present

## 2014-08-12 ENCOUNTER — Other Ambulatory Visit: Payer: Self-pay | Admitting: Orthopedic Surgery

## 2014-08-12 DIAGNOSIS — M5416 Radiculopathy, lumbar region: Secondary | ICD-10-CM | POA: Diagnosis not present

## 2014-08-12 DIAGNOSIS — S63501D Unspecified sprain of right wrist, subsequent encounter: Secondary | ICD-10-CM | POA: Diagnosis not present

## 2014-08-12 DIAGNOSIS — M4806 Spinal stenosis, lumbar region: Secondary | ICD-10-CM | POA: Diagnosis not present

## 2014-08-12 DIAGNOSIS — M170 Bilateral primary osteoarthritis of knee: Secondary | ICD-10-CM | POA: Diagnosis not present

## 2014-08-12 DIAGNOSIS — M25531 Pain in right wrist: Secondary | ICD-10-CM | POA: Diagnosis not present

## 2014-08-14 DIAGNOSIS — M5416 Radiculopathy, lumbar region: Secondary | ICD-10-CM | POA: Diagnosis not present

## 2014-08-14 DIAGNOSIS — M4806 Spinal stenosis, lumbar region: Secondary | ICD-10-CM | POA: Diagnosis not present

## 2014-08-14 DIAGNOSIS — M25531 Pain in right wrist: Secondary | ICD-10-CM | POA: Diagnosis not present

## 2014-08-14 DIAGNOSIS — S63501D Unspecified sprain of right wrist, subsequent encounter: Secondary | ICD-10-CM | POA: Diagnosis not present

## 2014-08-19 DIAGNOSIS — M25531 Pain in right wrist: Secondary | ICD-10-CM | POA: Diagnosis not present

## 2014-08-19 DIAGNOSIS — M4806 Spinal stenosis, lumbar region: Secondary | ICD-10-CM | POA: Diagnosis not present

## 2014-08-19 DIAGNOSIS — S63501D Unspecified sprain of right wrist, subsequent encounter: Secondary | ICD-10-CM | POA: Diagnosis not present

## 2014-08-19 DIAGNOSIS — M5416 Radiculopathy, lumbar region: Secondary | ICD-10-CM | POA: Diagnosis not present

## 2014-08-20 DIAGNOSIS — M4806 Spinal stenosis, lumbar region: Secondary | ICD-10-CM | POA: Diagnosis not present

## 2014-08-26 DIAGNOSIS — M778 Other enthesopathies, not elsewhere classified: Secondary | ICD-10-CM | POA: Diagnosis not present

## 2014-09-09 DIAGNOSIS — M1712 Unilateral primary osteoarthritis, left knee: Secondary | ICD-10-CM | POA: Diagnosis not present

## 2014-09-09 DIAGNOSIS — M25562 Pain in left knee: Secondary | ICD-10-CM | POA: Diagnosis not present

## 2014-09-09 NOTE — Pre-Procedure Instructions (Addendum)
    Walter White  09/09/2014      [name] [address] [place], [place] [address] [place] [address] Phone: [phone] Fax: [phone]    Your procedure is scheduled on Monday September 21 2014.  Report to [facility] Admitting at 915 A.M.  Call this number if you have problems the morning of surgery:  [phone]   Call this number if you have problems in the days leading up to your surgery:  [phone]    Remember:  Do not eat food or drink liquids after midnight Sunday September 20 2014.  Take these medicines the morning of surgery with A SIP OF WATER Hydrocodone (If needed), Tramadol (If needed), Tamsulosin (Flomax)   STOP: ALL Vitamins, Supplements, Effient and Herbal Medications, Fish Oils, Aspirins, NSAIDs (Nonsteroidal Anti-inflammatories such as Ibuprofen, Aleve, or Advil), and Goody's/BC Powders 7 days prior to surgery, until after surgery as directed by your physician. This includes Excedrin, Diclofenac gel (voltaren), and Meloxicam.    Do not wear jewelry.  Do not wear lotions, powders, or perfumes.  You may wear deodorant.  Do not shave 48 hours prior to surgery.  Men may shave face and neck.  Do not bring valuables to the hospital.  [facility] is not responsible for any belongings or valuables.  Contacts, dentures or bridgework may not be worn into surgery.  Leave your suitcase in the car.  After surgery it may be brought to your room.  For patients admitted to the hospital, discharge time will be determined by your treatment team.  Patients discharged the day of surgery will not be allowed to drive home.   Special instructions: Please follow these instructions carefully:  1. Shower with CHG Soap the night before surgery and the morning of Surgery. 2. If you choose to wash your hair, wash your hair first as usual with your normal shampoo. 3. After you shampoo, rinse your  hair and body thoroughly to remove the Shampoo. 4. Use CHG as you would any other liquid soap. You can apply chg directly to the skin and wash gently with scrungie or a clean washcloth. 5. Apply the CHG Soap to your body ONLY FROM THE NECK DOWN. Do not use on open wounds or open sores. Avoid contact with your eyes, ears, mouth and genitals (private parts). Wash genitals (private parts) with your normal soap. 6. Wash thoroughly, paying special attention to the area where your surgery will be performed. 7. Thoroughly rinse your body with warm water from the neck down. 8. DO NOT shower/wash with your normal soap after using and rinsing off the CHG Soap. 9. Pat yourself dry with a clean towel.  10. Wear clean pajamas.  11. Place clean sheets on your bed the night of your first shower and do not sleep with pets.  Day of Surgery  Do not apply any lotions/deodorants the morning of surgery. Please wear clean clothes to the hospital/surgery center.    Please read over the following fact sheets that you were given. Pain Booklet, Coughing and Deep Breathing, Blood Transfusion Information, Total Joint Packet, MRSA Information and Surgical Site Infection Prevention

## 2014-09-10 ENCOUNTER — Encounter (HOSPITAL_COMMUNITY): Payer: Self-pay

## 2014-09-10 ENCOUNTER — Encounter (HOSPITAL_COMMUNITY)
Admission: RE | Admit: 2014-09-10 | Discharge: 2014-09-10 | Disposition: A | Discharge status: Home / Self Care | Payer: Medicare Other | Source: Ambulatory Visit | Attending: Orthopedic Surgery | Admitting: Orthopedic Surgery

## 2014-09-10 DIAGNOSIS — M1712 Unilateral primary osteoarthritis, left knee: Secondary | ICD-10-CM | POA: Diagnosis not present

## 2014-09-10 DIAGNOSIS — Z0183 Encounter for blood typing: Secondary | ICD-10-CM | POA: Insufficient documentation

## 2014-09-10 DIAGNOSIS — R001 Bradycardia, unspecified: Secondary | ICD-10-CM | POA: Insufficient documentation

## 2014-09-10 DIAGNOSIS — Z01818 Encounter for other preprocedural examination: Secondary | ICD-10-CM

## 2014-09-10 DIAGNOSIS — Z01812 Encounter for preprocedural laboratory examination: Secondary | ICD-10-CM | POA: Diagnosis not present

## 2014-09-10 HISTORY — DX: Benign prostatic hyperplasia without lower urinary tract symptoms: N40.0

## 2014-09-10 HISTORY — DX: Failed or difficult intubation, initial encounter: T88.4XXA

## 2014-09-10 HISTORY — DX: Pneumonia, unspecified organism: J18.9

## 2014-09-10 HISTORY — DX: Other seasonal allergic rhinitis: J30.2

## 2014-09-10 HISTORY — DX: Gastro-esophageal reflux disease without esophagitis: K21.9

## 2014-09-10 LAB — TYPE AND SCREEN
ABO/RH(D): O POS
Antibody Screen: NEGATIVE

## 2014-09-10 LAB — CBC WITH DIFFERENTIAL/PLATELET
BASOS PCT: 0 % (ref 0–1)
Basophils Absolute: 0 10*3/uL (ref 0.0–0.1)
Eosinophils Absolute: 0.1 10*3/uL (ref 0.0–0.7)
Eosinophils Relative: 3 % (ref 0–5)
HEMATOCRIT: 35.2 % — AB (ref 39.0–52.0)
Hemoglobin: 12 g/dL — ABNORMAL LOW (ref 13.0–17.0)
Lymphocytes Relative: 26 % (ref 12–46)
Lymphs Abs: 0.8 10*3/uL (ref 0.7–4.0)
MCH: 30.7 pg (ref 26.0–34.0)
MCHC: 34.1 g/dL (ref 30.0–36.0)
MCV: 90 fL (ref 78.0–100.0)
MONO ABS: 0.3 10*3/uL (ref 0.1–1.0)
MONOS PCT: 9 % (ref 3–12)
NEUTROS ABS: 2 10*3/uL (ref 1.7–7.7)
Neutrophils Relative %: 62 % (ref 43–77)
Platelets: 175 10*3/uL (ref 150–400)
RBC: 3.91 MIL/uL — ABNORMAL LOW (ref 4.22–5.81)
RDW: 13.4 % (ref 11.5–15.5)
WBC: 3.2 10*3/uL — ABNORMAL LOW (ref 4.0–10.5)

## 2014-09-10 LAB — PROTIME-INR
INR: 1.02 (ref 0.00–1.49)
Prothrombin Time: 13.6 seconds (ref 11.6–15.2)

## 2014-09-10 LAB — SURGICAL PCR SCREEN
MRSA, PCR: NEGATIVE
Staphylococcus aureus: NEGATIVE

## 2014-09-10 LAB — ABO/RH: ABO/RH(D): O POS

## 2014-09-10 LAB — URINALYSIS, ROUTINE W REFLEX MICROSCOPIC
Bilirubin Urine: NEGATIVE
GLUCOSE, UA: NEGATIVE mg/dL
Ketones, ur: NEGATIVE mg/dL
LEUKOCYTES UA: NEGATIVE
Nitrite: NEGATIVE
PROTEIN: NEGATIVE mg/dL
Specific Gravity, Urine: 1.025 (ref 1.005–1.030)
UROBILINOGEN UA: 0.2 mg/dL (ref 0.0–1.0)
pH: 5.5 (ref 5.0–8.0)

## 2014-09-10 LAB — BASIC METABOLIC PANEL
ANION GAP: 6 (ref 5–15)
BUN: 18 mg/dL (ref 6–20)
CALCIUM: 9.1 mg/dL (ref 8.9–10.3)
CO2: 23 mmol/L (ref 22–32)
CREATININE: 0.81 mg/dL (ref 0.61–1.24)
Chloride: 109 mmol/L (ref 101–111)
GFR calc Af Amer: >60 mL/min (ref >60)
GFR calc non Af Amer: >60 mL/min (ref >60)
GLUCOSE: 123 mg/dL — AB (ref 65–99)
Potassium: 3.7 mmol/L (ref 3.5–5.1)
Sodium: 138 mmol/L (ref 135–145)

## 2014-09-10 LAB — URINE MICROSCOPIC-ADD ON

## 2014-09-10 LAB — APTT: aPTT: 29 seconds (ref 24–37)

## 2014-09-10 NOTE — Progress Notes (Addendum)
Patient denies having ever seen a cardiologist, ever having a stress test, catheterization, or echocardiogram. Patient's PCP is Dr. Trilby Drummer.

## 2014-09-15 DIAGNOSIS — M778 Other enthesopathies, not elsewhere classified: Secondary | ICD-10-CM | POA: Diagnosis not present

## 2014-09-18 NOTE — H&P (Signed)
TOTAL KNEE ADMISSION H&P  Patient is being admitted for left total knee arthroplasty.  Subjective:  Chief Complaint:left knee pain.  HPI: Walter White, 64 y.o. male, has a history of pain and functional disability in the left knee due to arthritis and has failed non-surgical conservative treatments for greater than 12 weeks to includeNSAID's and/or analgesics, corticosteriod injections, viscosupplementation injections, flexibility and strengthening excercises, use of assistive devices, weight reduction as appropriate and activity modification.  Onset of symptoms was gradual, starting 6 years ago with gradually worsening course since that time. The patient noted prior procedures on the knee to include  arthroscopy on the left knee(s).  Patient currently rates pain in the left knee(s) at 10 out of 10 with activity. Patient has night pain, worsening of pain with activity and weight bearing, pain that interferes with activities of daily living, pain with passive range of motion, crepitus and joint swelling.  Patient has evidence of periarticular osteophytes and joint space narrowing by imaging studies. There is no active infection.  There are no active problems to display for this patient.  Past Medical History  Diagnosis Date  . Arthritis     spinal steniosis  . Spinal stenosis   . 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 Seasonal allergies   . Pneumonia     when very young  . Benign prostate hyperplasia   . GERD (gastroesophageal reflux disease)     Past Surgical History  Procedure Laterality Date  . Knee arthroscopy  2010    rt   . Colonoscopy    . Knee arthroscopy    . Knee arthroscopy  11/23/2010    Procedure: ARTHROSCOPY KNEE;  Surgeon: Kerin Salen;  Location: [place];  Service: Orthopedics;  Laterality: Left;  arthroscopy left knee  . Colonoscopy  2010  .  Polypectomy  2010  . Wisdom tooth extraction      No prescriptions prior to admission   Allergies  Allergen Reactions  . Penicillins     Per pt: unknown    Social History  Substance Use Topics  . Smoking status: Former Smoker    Quit date: 11/21/1980  . Smokeless tobacco: Never Used  . Alcohol Use: Yes     Comment: rare    No family history on file.   Review of Systems  Constitutional: Negative.   HENT: Positive for hearing loss.   Eyes: Negative.   Respiratory: Negative.   Cardiovascular: Negative.   Gastrointestinal: Positive for abdominal pain.  Genitourinary: Positive for urgency and frequency.  Musculoskeletal: Positive for joint pain.  Skin: Negative.   Neurological: Negative.        Poor balance  Endo/Heme/Allergies: Negative.   Psychiatric/Behavioral: Negative.     Objective:  Physical Exam  Constitutional: He is oriented to person, place, and time. He appears well-developed and well-nourished.  HENT:  Head: Normocephalic and atraumatic.  Eyes: Pupils are equal, round, and reactive to light.  Neck: Normal range of motion. Neck supple.  Cardiovascular: Intact distal pulses.   Respiratory: Effort normal.  Musculoskeletal: He exhibits tenderness.  Both knees have a varus deformity of about 5 and  5 flexion contracture on the left.  No flexion contracture on the right.  Both knees flex to 125 with some discomfort he is tender along the medial joint line, 1+ crepitus on the left no crepitus on the right.  Toes are pink and well-perfused.  His motor exam is intact today.    Neurological: He is alert and oriented to person, place, and time.  Skin: Skin is warm and dry.  Psychiatric: He has a normal mood and affect. His behavior is normal. Judgment and thought content normal.    Vital signs in last 24 hours:    Labs:   Estimated body mass index is 38.83 kg/(m^2) as calculated from the following:   Height as of 06/30/11: 5\' 7"  (1.702 m).   Weight as of  06/30/11: 112.492 kg (248 lb).   Imaging Review Plain radiographs demonstrate AP, Rosenberg, lateral and sunrise x-rays of both knees show bone-on-bone arthritis medial compartment of the left knee near bone-on-bone on the right peripheral osteophytes.    Assessment/Plan:  End stage arthritis, left knee   The patient history, physical examination, clinical judgment of the provider and imaging studies are consistent with end stage degenerative joint disease of the left knee(s) and total knee arthroplasty is deemed medically necessary. The treatment options including medical management, injection therapy arthroscopy and arthroplasty were discussed at length. The risks and benefits of total knee arthroplasty were presented and reviewed. The risks due to aseptic loosening, infection, stiffness, patella tracking problems, thromboembolic complications and other imponderables were discussed. The patient acknowledged the explanation, agreed to proceed with the plan and consent was signed. Patient is being admitted for inpatient treatment for surgery, pain control, PT, OT, prophylactic antibiotics, VTE prophylaxis, progressive ambulation and ADL's and discharge planning. The patient is planning to be discharged home with home health services

## 2014-09-18 NOTE — Progress Notes (Signed)
Had to leave VM on both phone numbers for new arrival time change of 10:30 AM on Monday 9/12

## 2014-09-20 DIAGNOSIS — M1712 Unilateral primary osteoarthritis, left knee: Secondary | ICD-10-CM | POA: Diagnosis present

## 2014-09-20 MED ORDER — VANCOMYCIN HCL 10 G IV SOLR
1500.0000 mg | INTRAVENOUS | Status: DC
  Filled 2014-09-20: qty 1500

## 2014-09-20 MED ORDER — CHLORHEXIDINE GLUCONATE 4 % EX LIQD: 60.0000 mL | Freq: Once | CUTANEOUS | Status: DC

## 2014-09-21 ENCOUNTER — Encounter (HOSPITAL_COMMUNITY)
Admission: RE | Disposition: A | Discharge status: Home Health | Payer: Self-pay | Source: Ambulatory Visit | Attending: Orthopedic Surgery

## 2014-09-21 ENCOUNTER — Encounter (HOSPITAL_COMMUNITY): Payer: Self-pay | Admitting: Anesthesiology

## 2014-09-21 ENCOUNTER — Inpatient Hospital Stay (HOSPITAL_COMMUNITY): Payer: Medicare Other | Admitting: Anesthesiology

## 2014-09-21 ENCOUNTER — Inpatient Hospital Stay (HOSPITAL_COMMUNITY)
Admission: RE | Admit: 2014-09-21 | Discharge: 2014-09-23 | DRG: 470 | Disposition: A | Discharge status: Home Health | Payer: Medicare Other | Source: Ambulatory Visit | Attending: Orthopedic Surgery | Admitting: Orthopedic Surgery

## 2014-09-21 ENCOUNTER — Inpatient Hospital Stay (HOSPITAL_COMMUNITY): Payer: Medicare Other | Admitting: Vascular Surgery

## 2014-09-21 DIAGNOSIS — N400 Enlarged prostate without lower urinary tract symptoms: Secondary | ICD-10-CM | POA: Diagnosis present

## 2014-09-21 DIAGNOSIS — M4800 Spinal stenosis, site unspecified: Secondary | ICD-10-CM | POA: Diagnosis present

## 2014-09-21 DIAGNOSIS — M1710 Unilateral primary osteoarthritis, unspecified knee: Secondary | ICD-10-CM | POA: Diagnosis present

## 2014-09-21 DIAGNOSIS — Z87891 Personal history of nicotine dependence: Secondary | ICD-10-CM | POA: Diagnosis not present

## 2014-09-21 DIAGNOSIS — D62 Acute posthemorrhagic anemia: Secondary | ICD-10-CM | POA: Diagnosis not present

## 2014-09-21 DIAGNOSIS — M1712 Unilateral primary osteoarthritis, left knee: Principal | ICD-10-CM | POA: Diagnosis present

## 2014-09-21 DIAGNOSIS — M25562 Pain in left knee: Secondary | ICD-10-CM | POA: Diagnosis not present

## 2014-09-21 DIAGNOSIS — K219 Gastro-esophageal reflux disease without esophagitis: Secondary | ICD-10-CM | POA: Diagnosis present

## 2014-09-21 DIAGNOSIS — M179 Osteoarthritis of knee, unspecified: Secondary | ICD-10-CM | POA: Diagnosis not present

## 2014-09-21 HISTORY — PX: TOTAL KNEE ARTHROPLASTY: SHX125

## 2014-09-21 SURGERY — ARTHROPLASTY, KNEE, TOTAL
Anesthesia: Monitor Anesthesia Care | Site: Knee | Laterality: Left

## 2014-09-21 MED ORDER — TRANEXAMIC ACID 1000 MG/10ML IV SOLN
1000.0000 mg | INTRAVENOUS | Status: AC
  Administered 2014-09-21: 1000 mg via INTRAVENOUS
  Filled 2014-09-21: qty 10

## 2014-09-21 MED ORDER — PHENYLEPHRINE HCL 10 MG/ML IJ SOLN
10.0000 mg | INTRAVENOUS | Status: DC | PRN
  Administered 2014-09-21: 20 ug/min via INTRAVENOUS

## 2014-09-21 MED ORDER — DOCUSATE SODIUM 100 MG PO CAPS
100.0000 mg | ORAL_CAPSULE | Freq: Two times a day (BID) | ORAL | Status: DC
  Administered 2014-09-21 – 2014-09-23 (×4): 100 mg via ORAL
  Filled 2014-09-21 (×3): qty 1

## 2014-09-21 MED ORDER — HYDROMORPHONE HCL 1 MG/ML IJ SOLN
INTRAMUSCULAR | Status: AC
  Filled 2014-09-21: qty 1

## 2014-09-21 MED ORDER — METHOCARBAMOL 500 MG PO TABS
500.0000 mg | ORAL_TABLET | Freq: Four times a day (QID) | ORAL | Status: DC | PRN
  Administered 2014-09-21 – 2014-09-23 (×6): 500 mg via ORAL
  Filled 2014-09-21 (×7): qty 1

## 2014-09-21 MED ORDER — MELOXICAM 15 MG PO TABS
15.0000 mg | ORAL_TABLET | Freq: Every morning | ORAL | Status: DC
  Administered 2014-09-22 – 2014-09-23 (×2): 15 mg via ORAL
  Filled 2014-09-21 (×3): qty 1

## 2014-09-21 MED ORDER — FENTANYL CITRATE (PF) 100 MCG/2ML IJ SOLN
INTRAMUSCULAR | Status: DC | PRN
  Administered 2014-09-21: 50 ug via INTRAVENOUS

## 2014-09-21 MED ORDER — OXYCODONE-ACETAMINOPHEN 5-325 MG PO TABS: 1.0000 | ORAL_TABLET | ORAL | Status: DC | PRN

## 2014-09-21 MED ORDER — MIDAZOLAM HCL 5 MG/5ML IJ SOLN
INTRAMUSCULAR | Status: DC | PRN
  Administered 2014-09-21 (×2): 1 mg via INTRAVENOUS

## 2014-09-21 MED ORDER — METOCLOPRAMIDE HCL 5 MG PO TABS: 5.0000 mg | ORAL_TABLET | Freq: Three times a day (TID) | ORAL | Status: DC | PRN

## 2014-09-21 MED ORDER — PROPOFOL INFUSION 10 MG/ML OPTIME
INTRAVENOUS | Status: DC | PRN
  Administered 2014-09-21: 75 ug/kg/min via INTRAVENOUS

## 2014-09-21 MED ORDER — DIPHENHYDRAMINE HCL 12.5 MG/5ML PO ELIX: 12.5000 mg | ORAL_SOLUTION | ORAL | Status: DC | PRN

## 2014-09-21 MED ORDER — BISACODYL 5 MG PO TBEC: 5.0000 mg | DELAYED_RELEASE_TABLET | Freq: Every day | ORAL | Status: DC | PRN

## 2014-09-21 MED ORDER — BUPIVACAINE LIPOSOME 1.3 % IJ SUSP
20.0000 mL | Freq: Once | INTRAMUSCULAR | Status: DC
  Filled 2014-09-21: qty 20

## 2014-09-21 MED ORDER — TAMSULOSIN HCL 0.4 MG PO CAPS
0.4000 mg | ORAL_CAPSULE | Freq: Every evening | ORAL | Status: DC
  Administered 2014-09-21 – 2014-09-22 (×2): 0.4 mg via ORAL
  Filled 2014-09-21 (×2): qty 1

## 2014-09-21 MED ORDER — ASPIRIN EC 325 MG PO TBEC
325.0000 mg | DELAYED_RELEASE_TABLET | Freq: Every day | ORAL | Status: DC
  Administered 2014-09-22 – 2014-09-23 (×2): 325 mg via ORAL
  Filled 2014-09-21 (×2): qty 1

## 2014-09-21 MED ORDER — SODIUM CHLORIDE 0.9 % IJ SOLN
INTRAMUSCULAR | Status: DC | PRN
  Administered 2014-09-21: 40 mL via INTRAVENOUS

## 2014-09-21 MED ORDER — OXYCODONE HCL 5 MG PO TABS
5.0000 mg | ORAL_TABLET | ORAL | Status: DC | PRN
  Administered 2014-09-21 – 2014-09-23 (×11): 10 mg via ORAL
  Filled 2014-09-21 (×11): qty 2

## 2014-09-21 MED ORDER — PHENOL 1.4 % MT LIQD: 1.0000 | OROMUCOSAL | Status: DC | PRN

## 2014-09-21 MED ORDER — ACETAMINOPHEN 325 MG PO TABS: 650.0000 mg | ORAL_TABLET | Freq: Four times a day (QID) | ORAL | Status: DC | PRN

## 2014-09-21 MED ORDER — HYDROMORPHONE HCL 1 MG/ML IJ SOLN
0.2500 mg | INTRAMUSCULAR | Status: DC | PRN
  Administered 2014-09-21 (×2): 0.5 mg via INTRAVENOUS

## 2014-09-21 MED ORDER — ALUM & MAG HYDROXIDE-SIMETH 200-200-20 MG/5ML PO SUSP: 30.0000 mL | ORAL | Status: DC | PRN

## 2014-09-21 MED ORDER — ACETAMINOPHEN 650 MG RE SUPP: 650.0000 mg | Freq: Four times a day (QID) | RECTAL | Status: DC | PRN

## 2014-09-21 MED ORDER — MENTHOL 3 MG MT LOZG
1.0000 | LOZENGE | OROMUCOSAL | Status: DC | PRN
Start: 2014-09-21 — End: 2014-09-23

## 2014-09-21 MED ORDER — HYDROMORPHONE HCL 1 MG/ML IJ SOLN
0.5000 mg | INTRAMUSCULAR | Status: DC | PRN
  Administered 2014-09-21: 0.5 mg via INTRAVENOUS
  Administered 2014-09-21: 1 mg via INTRAVENOUS
  Administered 2014-09-21: 0.5 mg via INTRAVENOUS
  Filled 2014-09-21: qty 1

## 2014-09-21 MED ORDER — CEFUROXIME SODIUM 1.5 G IJ SOLR
INTRAMUSCULAR | Status: AC
Start: 2014-09-21 — End: 2014-09-21
  Filled 2014-09-21: qty 1.5

## 2014-09-21 MED ORDER — TRANEXAMIC ACID 1000 MG/10ML IV SOLN
2000.0000 mg | INTRAVENOUS | Status: DC
  Filled 2014-09-21: qty 20

## 2014-09-21 MED ORDER — CEFUROXIME SODIUM 1.5 G IJ SOLR
INTRAMUSCULAR | Status: DC | PRN
  Administered 2014-09-21: 1.5 g

## 2014-09-21 MED ORDER — TRANEXAMIC ACID 1000 MG/10ML IV SOLN
2000.0000 mg | INTRAVENOUS | Status: DC | PRN
  Administered 2014-09-21: 2000 mg via TOPICAL

## 2014-09-21 MED ORDER — LACTATED RINGERS IV SOLN
INTRAVENOUS | Status: DC
  Administered 2014-09-21 (×2): via INTRAVENOUS

## 2014-09-21 MED ORDER — METHOCARBAMOL 1000 MG/10ML IJ SOLN
500.0000 mg | Freq: Four times a day (QID) | INTRAVENOUS | Status: DC | PRN
  Filled 2014-09-21: qty 5

## 2014-09-21 MED ORDER — BUPIVACAINE IN DEXTROSE 0.75-8.25 % IT SOLN
INTRATHECAL | Status: DC | PRN
  Administered 2014-09-21: 1.8 mL via INTRATHECAL

## 2014-09-21 MED ORDER — BUPIVACAINE LIPOSOME 1.3 % IJ SUSP
INTRAMUSCULAR | Status: DC | PRN
  Administered 2014-09-21: 20 mL

## 2014-09-21 MED ORDER — ONDANSETRON HCL 4 MG/2ML IJ SOLN
4.0000 mg | Freq: Four times a day (QID) | INTRAMUSCULAR | Status: DC | PRN
  Administered 2014-09-21: 4 mg via INTRAVENOUS
  Filled 2014-09-21: qty 2

## 2014-09-21 MED ORDER — FENTANYL CITRATE (PF) 250 MCG/5ML IJ SOLN
INTRAMUSCULAR | Status: AC
  Filled 2014-09-21: qty 5

## 2014-09-21 MED ORDER — SODIUM CHLORIDE 0.9 % IR SOLN
Status: DC | PRN
  Administered 2014-09-21: 1000 mL

## 2014-09-21 MED ORDER — METHOCARBAMOL 500 MG PO TABS
ORAL_TABLET | ORAL | Status: AC
  Filled 2014-09-21: qty 1

## 2014-09-21 MED ORDER — FLEET ENEMA 7-19 GM/118ML RE ENEM: 1.0000 | ENEMA | Freq: Once | RECTAL | Status: DC | PRN

## 2014-09-21 MED ORDER — 0.9 % SODIUM CHLORIDE (POUR BTL) OPTIME
TOPICAL | Status: DC | PRN
  Administered 2014-09-21: 1000 mL

## 2014-09-21 MED ORDER — DEXTROSE-NACL 5-0.45 % IV SOLN: INTRAVENOUS | Status: DC

## 2014-09-21 MED ORDER — METHOCARBAMOL 500 MG PO TABS: 500.0000 mg | ORAL_TABLET | Freq: Two times a day (BID) | ORAL | Status: DC

## 2014-09-21 MED ORDER — SENNOSIDES-DOCUSATE SODIUM 8.6-50 MG PO TABS: 1.0000 | ORAL_TABLET | Freq: Every evening | ORAL | Status: DC | PRN

## 2014-09-21 MED ORDER — CEFAZOLIN SODIUM-DEXTROSE 2-3 GM-% IV SOLR
INTRAVENOUS | Status: AC
  Administered 2014-09-21: 2 g via INTRAVENOUS
  Filled 2014-09-21: qty 50

## 2014-09-21 MED ORDER — OXYCODONE HCL 5 MG PO TABS
ORAL_TABLET | ORAL | Status: AC
  Filled 2014-09-21: qty 2

## 2014-09-21 MED ORDER — METOCLOPRAMIDE HCL 5 MG/ML IJ SOLN: 5.0000 mg | Freq: Three times a day (TID) | INTRAMUSCULAR | Status: DC | PRN

## 2014-09-21 MED ORDER — ASPIRIN EC 325 MG PO TBEC: 325.0000 mg | DELAYED_RELEASE_TABLET | Freq: Two times a day (BID) | ORAL | Status: DC

## 2014-09-21 MED ORDER — KCL IN DEXTROSE-NACL 20-5-0.45 MEQ/L-%-% IV SOLN
INTRAVENOUS | Status: DC
  Administered 2014-09-22: 02:00:00 via INTRAVENOUS
  Filled 2014-09-21: qty 1000

## 2014-09-21 MED ORDER — ONDANSETRON HCL 4 MG PO TABS: 4.0000 mg | ORAL_TABLET | Freq: Four times a day (QID) | ORAL | Status: DC | PRN

## 2014-09-21 MED ORDER — MIDAZOLAM HCL 2 MG/2ML IJ SOLN
INTRAMUSCULAR | Status: AC
  Filled 2014-09-21: qty 4

## 2014-09-21 SURGICAL SUPPLY — 63 items
BAG DECANTER FOR FLEXI CONT (MISCELLANEOUS) ×2 IMPLANT
BANDAGE ELASTIC 6 VELCRO ST LF (GAUZE/BANDAGES/DRESSINGS) ×2 IMPLANT
BANDAGE ESMARK 6X9 LF (GAUZE/BANDAGES/DRESSINGS) ×1 IMPLANT
BLADE SAG 18X100X1.27 (BLADE) ×2 IMPLANT
BLADE SAW SGTL 13X75X1.27 (BLADE) ×2 IMPLANT
BLADE SURG ROTATE 9660 (MISCELLANEOUS) IMPLANT
BNDG ELASTIC 6X10 VLCR STRL LF (GAUZE/BANDAGES/DRESSINGS) ×2 IMPLANT
BNDG ESMARK 6X9 LF (GAUZE/BANDAGES/DRESSINGS) ×2
BOWL SMART MIX CTS (DISPOSABLE) ×2 IMPLANT
CAPT KNEE TOTAL 3 ATTUNE ×2 IMPLANT
CEMENT HV SMART SET (Cement) ×4 IMPLANT
COVER SURGICAL LIGHT HANDLE (MISCELLANEOUS) ×2 IMPLANT
CUFF TOURNIQUET SINGLE 34IN LL (TOURNIQUET CUFF) ×2 IMPLANT
DRAPE EXTREMITY T 121X128X90 (DRAPE) ×2 IMPLANT
DRAPE U-SHAPE 47X51 STRL (DRAPES) ×2 IMPLANT
DURAPREP 26ML APPLICATOR (WOUND CARE) ×4 IMPLANT
ELECT REM PT RETURN 9FT ADLT (ELECTROSURGICAL) ×2
ELECTRODE REM PT RTRN 9FT ADLT (ELECTROSURGICAL) ×1 IMPLANT
GAUZE SPONGE 4X4 12PLY STRL (GAUZE/BANDAGES/DRESSINGS) ×4 IMPLANT
GAUZE XEROFORM 1X8 LF (GAUZE/BANDAGES/DRESSINGS) ×2 IMPLANT
GLOVE BIO SURGEON STRL SZ7.5 (GLOVE) ×2 IMPLANT
GLOVE BIO SURGEON STRL SZ8.5 (GLOVE) ×2 IMPLANT
GLOVE BIOGEL PI IND STRL 6.5 (GLOVE) ×3 IMPLANT
GLOVE BIOGEL PI IND STRL 8 (GLOVE) ×1 IMPLANT
GLOVE BIOGEL PI IND STRL 9 (GLOVE) ×1 IMPLANT
GLOVE BIOGEL PI INDICATOR 6.5 (GLOVE) ×3
GLOVE BIOGEL PI INDICATOR 8 (GLOVE) ×1
GLOVE BIOGEL PI INDICATOR 9 (GLOVE) ×1
GLOVE SURG SS PI 6.0 STRL IVOR (GLOVE) ×2 IMPLANT
GLOVE SURG SS PI 6.5 STRL IVOR (GLOVE) ×4 IMPLANT
GOWN STRL REUS W/ TWL LRG LVL3 (GOWN DISPOSABLE) ×1 IMPLANT
GOWN STRL REUS W/ TWL XL LVL3 (GOWN DISPOSABLE) ×2 IMPLANT
GOWN STRL REUS W/TWL LRG LVL3 (GOWN DISPOSABLE) ×1
GOWN STRL REUS W/TWL XL LVL3 (GOWN DISPOSABLE) ×2
HANDPIECE INTERPULSE COAX TIP (DISPOSABLE) ×1
HOOD PEEL AWAY FACE SHEILD DIS (HOOD) ×4 IMPLANT
KIT BASIN OR (CUSTOM PROCEDURE TRAY) ×2 IMPLANT
KIT ROOM TURNOVER OR (KITS) ×2 IMPLANT
MANIFOLD NEPTUNE II (INSTRUMENTS) ×2 IMPLANT
NEEDLE SPNL 18GX3.5 QUINCKE PK (NEEDLE) IMPLANT
NS IRRIG 1000ML POUR BTL (IV SOLUTION) ×2 IMPLANT
PACK TOTAL JOINT (CUSTOM PROCEDURE TRAY) ×2 IMPLANT
PACK UNIVERSAL I (CUSTOM PROCEDURE TRAY) ×2 IMPLANT
PAD ABD 8X10 STRL (GAUZE/BANDAGES/DRESSINGS) ×2 IMPLANT
PAD ARMBOARD 7.5X6 YLW CONV (MISCELLANEOUS) ×2 IMPLANT
PADDING CAST ABS 6INX4YD NS (CAST SUPPLIES) ×1
PADDING CAST ABS COTTON 6X4 NS (CAST SUPPLIES) ×1 IMPLANT
PADDING CAST COTTON 6X4 STRL (CAST SUPPLIES) ×2 IMPLANT
SET HNDPC FAN SPRY TIP SCT (DISPOSABLE) ×1 IMPLANT
SPONGE GAUZE 4X4 12PLY STER LF (GAUZE/BANDAGES/DRESSINGS) ×2 IMPLANT
SUT VIC AB 0 CT1 27 (SUTURE) ×1
SUT VIC AB 0 CT1 27XBRD ANBCTR (SUTURE) ×1 IMPLANT
SUT VIC AB 1 CTX 36 (SUTURE) ×1
SUT VIC AB 1 CTX36XBRD ANBCTR (SUTURE) ×1 IMPLANT
SUT VIC AB 2-0 CT1 27 (SUTURE) ×1
SUT VIC AB 2-0 CT1 TAPERPNT 27 (SUTURE) ×1 IMPLANT
SUT VIC AB 3-0 CT1 27 (SUTURE) ×1
SUT VIC AB 3-0 CT1 TAPERPNT 27 (SUTURE) ×1 IMPLANT
SUT VIC AB 3-0 FS2 27 (SUTURE) ×2 IMPLANT
SYR 50ML LL SCALE MARK (SYRINGE) ×2 IMPLANT
TOWEL OR 17X24 6PK STRL BLUE (TOWEL DISPOSABLE) ×2 IMPLANT
TOWEL OR 17X26 10 PK STRL BLUE (TOWEL DISPOSABLE) ×2 IMPLANT
WATER STERILE IRR 1000ML POUR (IV SOLUTION) ×6 IMPLANT

## 2014-09-21 NOTE — Anesthesia Procedure Notes (Signed)
Procedure Name: MAC Date/Time: 09/21/2014 12:50 PM Performed by: Jenne Campus Pre-anesthesia Checklist: Patient identified, Emergency Drugs available, Suction available, Patient being monitored and Timeout performed Patient Re-evaluated:Patient Re-evaluated prior to inductionOxygen Delivery Method: Simple face mask

## 2014-09-21 NOTE — Interval H&P Note (Signed)
History and Physical Interval Note:  09/21/2014 12:21 PM  Walter White  has presented today for surgery, with the diagnosis of PRIMARY OSTEOARTHRITIS OF THE LEFT KNEE  The various methods of treatment have been discussed with the patient and family. After consideration of risks, benefits and other options for treatment, the patient has consented to  Procedure(s): TOTAL KNEE ARTHROPLASTY (Left) as a surgical intervention .  The patient's history has been reviewed, patient examined, no change in status, stable for surgery.  I have reviewed the patient's chart and labs.  Questions were answered to the patient's satisfaction.     Kerin Salen

## 2014-09-21 NOTE — Progress Notes (Signed)
Utilization review completed.  

## 2014-09-21 NOTE — Anesthesia Preprocedure Evaluation (Addendum)
Anesthesia Evaluation  Patient identified by MRN, date of birth, ID band Patient awake    Reviewed: Allergy & Precautions, NPO status , Patient's Chart, lab work & pertinent test results  History of Anesthesia Complications (+) DIFFICULT AIRWAY and history of anesthetic complications  Airway Mallampati: II  TM Distance: >3 FB Neck ROM: Full    Dental  (+) Teeth Intact, Dental Advisory Given   Pulmonary former smoker,    breath sounds clear to auscultation       Cardiovascular  Rhythm:Regular     Neuro/Psych Spinal stenosis herniated disk  Neuromuscular disease negative psych ROS   GI/Hepatic Neg liver ROS, GERD  ,  Endo/Other  Morbid obesity  Renal/GU negative Renal ROS     Musculoskeletal  (+) Arthritis ,   Abdominal   Peds  Hematology   Anesthesia Other Findings   Reproductive/Obstetrics                           Anesthesia Physical Anesthesia Plan  ASA: II  Anesthesia Plan: MAC and Spinal   Post-op Pain Management:    Induction: Intravenous  Airway Management Planned: Nasal Cannula  Additional Equipment: None  Intra-op Plan:   Post-operative Plan:   Informed Consent: I have reviewed the patients History and Physical, chart, labs and discussed the procedure including the risks, benefits and alternatives for the proposed anesthesia with the patient or authorized representative who has indicated his/her understanding and acceptance.   Dental advisory given  Plan Discussed with: CRNA and Surgeon  Anesthesia Plan Comments:         Anesthesia Quick Evaluation

## 2014-09-21 NOTE — Op Note (Signed)
PATIENT ID:      Walter White  MRN:     [id number] DOB/AGE:    09-30-1950 / 64 y.o.       OPERATIVE REPORT    DATE OF PROCEDURE:  09/21/2014       PREOPERATIVE DIAGNOSIS:   PRIMARY OSTEOARTHRITIS OF THE LEFT KNEE      Estimated body mass index is 37.72 kg/(m^2) as calculated from the following:   Height as of 09/10/14: 5\' 8"  (1.727 m).   Weight as of 06/30/11: 112.492 kg (248 lb).                                                        POSTOPERATIVE DIAGNOSIS:   PRIMARY OSTEOARTHRITIS OF THE LEFT KNEE                                                                      PROCEDURE:  Procedure(s): TOTAL KNEE ARTHROPLASTY Using DepuyAttune RP implants #7L Femur, #8Tibia, 6 mm Attune RP bearing, 41 Patella     SURGEON: Brennin Durfee J    ASSISTANT:   [employer]   (Present and scrubbed throughout the case, critical for assistance with exposure, retraction, instrumentation, and closure.)         ANESTHESIA: spinal, Exparel  EBL: 400  FLUID REPLACEMENT: 1800 crystalloid  TOURNIQUET TIME: 4min  Drains: None  Tranexamic Acid: 1gm iv 2gm topical   COMPLICATIONS:  None         INDICATIONS FOR PROCEDURE: The patient has  PRIMARY OSTEOARTHRITIS OF THE LEFT KNEE, varus deformities, XR shows bone on bone arthritis. Patient has failed all conservative measures including anti-inflammatory medicines, narcotics, attempts at  exercise and weight loss, cortisone injections and viscosupplementation.  Risks and benefits of surgery have been discussed, questions answered.   DESCRIPTION OF PROCEDURE: The patient identified by armband, received  IV antibiotics, in the holding area at [facility]. Patient taken to the operating room, appropriate anesthetic  monitors were attached, and general endotracheal anesthesia induced with  the patient in supine position. Tourniquet  applied high to the operative thigh. Lateral post and foot positioner  applied to the table, the lower extremity was then  prepped and draped  in usual sterile fashion from the ankle to the tourniquet. Time-out procedure was performed. We began the operation, with the knee flexed 100 degrees, by making the anterior midline incision starting at handbreadth above the patella going over the patella 1 cm medial to and 4 cm distal to the tibial tubercle. Small bleeders in the skin and the  subcutaneous tissue identified and cauterized. Transverse retinaculum was incised and reflected medially and a medial parapatellar arthrotomy was accomplished. the patella was everted and theprepatellar fat pad resected. The superficial medial collateral  ligament was then elevated from anterior to posterior along the proximal  flare of the tibia and anterior half of the menisci resected. The knee was hyperflexed exposing bone on bone arthritis. Peripheral and notch osteophytes as well as the cruciate ligaments were then resected. We continued to  work our way around  posteriorly along the proximal tibia, and externally  rotated the tibia subluxing it out from underneath the femur. A McHale  retractor was placed through the notch and a lateral Hohmann retractor  placed, and we then drilled through the proximal tibia in line with the  axis of the tibia followed by an intramedullary guide rod and 2-degree  posterior slope cutting guide. The tibial cutting guide, 3 degree posterior sloped, was pinned into place allowing resection of 4 mm of bone medially and about 10 mm of bone laterally. Satisfied with the tibial resection, we then  entered the distal femur 2 mm anterior to the PCL origin with the  intramedullary guide rod and applied the distal femoral cutting guide  set at 28mm, with 5 degrees of valgus. This was pinned along the  epicondylar axis. At this point, the distal femoral cut was accomplished without difficulty. We then sized for a #7L femoral component and pinned the guide in 3 degrees of external rotation.The chamfer cutting guide  was pinned into place. The anterior, posterior, and chamfer cuts were accomplished without difficulty followed by  the Attune RP box cutting guide and the box cut. We also removed posterior osteophytes from the posterior femoral condyles. At this  time, the knee was brought into full extension. We checked our  extension and flexion gaps and found them symmetric for a 6 mm bearing. Distracting in extension with a lamina spreader, the posterior horns of the menisci were removed, and Exparel, diluted to 60 cc, was injected into the capsule of the knee. The  posterior patella cut was accomplished with the 9.5 mm Attune cutting guide, sized at 41 dome, and the fixation pegs drilled.The knee  was then once again hyperflexed exposing the proximal tibia. We sized for a #8 tibial base plate, applied the smokestack and the conical reamer followed by the the Delta fin keel punch. We then hammered into place the Attune RP trial femoral component, inserted a  6 mm trial bearing, trial patellar button, and took the knee through range of motion from 0-130 degrees. No thumb pressure was required for patellar  Tracking. At this point, the limb was wrapped with an Esmarch bandage and the tourniquet inflated to 350 mmHg. All trial components were removed, mating surfaces irrigated with pulse lavage, and dried with suction and sponges. A double batch of DePuy HV cement with 1500 mg of Zinacef was mixed and applied to all bony metallic mating surfaces except for the posterior condyles of the femur itself. In order, we  hammered into place the tibial tray and removed excess cement, the femoral component and removed excess cement,  The 25mm  Attune RP bearing  was inserted, and the knee brought to full extension with compression.  The patellar button was clamped into place, and excess cement  removed. While the cement cured the wound was irrigated out with normal saline solution pulse lavage. Ligament stability and patellar  tracking were checked and found to be excellent. The parapatellar arthrotomy was closed with  running #1 Vicryl suture. The subcutaneous tissue with 0 and 2-0 undyed  Vicryl suture, and the skin with running 3-0 SQ vicryl. A dressing of Xeroform,  4 x 4, dressing sponges, Webril, and Ace wrap applied. The patient  awakened, extubated, and taken to recovery room without difficulty.   Frederik Pear J 09/21/2014, 2:04 PM

## 2014-09-21 NOTE — Discharge Instructions (Signed)

## 2014-09-21 NOTE — Transfer of Care (Signed)
Immediate Anesthesia Transfer of Care Note  Patient: Walter White  Procedure(s) Performed: Procedure(s): TOTAL KNEE ARTHROPLASTY (Left)  Patient Location: PACU  Anesthesia Type:MAC and Spinal  Level of Consciousness: awake, alert , oriented and patient cooperative  Airway & Oxygen Therapy: Patient Spontanous Breathing and Patient connected to face mask oxygen  Post-op Assessment: Report given to RN and Post -op Vital signs reviewed and stable  Post vital signs: Reviewed  Last Vitals:  Filed Vitals:   09/21/14 1449  BP:   Pulse:   Temp: 36.6 C  Resp:     Complications: No apparent anesthesia complications

## 2014-09-21 NOTE — Progress Notes (Signed)
Orthopedic Tech Progress Note Patient Details:  Walter White 1950-08-06 [id number]  CPM Left Knee CPM Left Knee: On Left Knee Flexion (Degrees): 40 Left Knee Extension (Degrees): 0 Additional Comments: trapeze bar patient helper Viewed order from doctor's order list  Hildred Priest 09/21/2014, 3:07 PM

## 2014-09-22 ENCOUNTER — Encounter (HOSPITAL_COMMUNITY): Payer: Self-pay | Admitting: Orthopedic Surgery

## 2014-09-22 LAB — BASIC METABOLIC PANEL
Anion gap: 5 (ref 5–15)
BUN: 14 mg/dL (ref 6–20)
CALCIUM: 8.4 mg/dL — AB (ref 8.9–10.3)
CO2: 27 mmol/L (ref 22–32)
CREATININE: 0.73 mg/dL (ref 0.61–1.24)
Chloride: 102 mmol/L (ref 101–111)
Glucose, Bld: 153 mg/dL — ABNORMAL HIGH (ref 65–99)
Potassium: 4 mmol/L (ref 3.5–5.1)
SODIUM: 134 mmol/L — AB (ref 135–145)

## 2014-09-22 LAB — CBC
HCT: 31.7 % — ABNORMAL LOW (ref 39.0–52.0)
HEMOGLOBIN: 10.5 g/dL — AB (ref 13.0–17.0)
MCH: 30.3 pg (ref 26.0–34.0)
MCHC: 33.1 g/dL (ref 30.0–36.0)
MCV: 91.6 fL (ref 78.0–100.0)
PLATELETS: 157 10*3/uL (ref 150–400)
RBC: 3.46 MIL/uL — ABNORMAL LOW (ref 4.22–5.81)
RDW: 13.6 % (ref 11.5–15.5)
WBC: 5.7 10*3/uL (ref 4.0–10.5)

## 2014-09-22 NOTE — Progress Notes (Signed)
Patient ID: Walter White, male   DOB: 03/05/1950, 64 y.o.   MRN: [id number] PATIENT ID: Walter White  MRN: [id number]  DOB/AGE:  1950/10/31 / 64 y.o.  1 Day Post-Op Procedure(s) (LRB): TOTAL KNEE ARTHROPLASTY (Left)    PROGRESS NOTE Subjective: Patient is alert, oriented, n0 Nausea, n0 Vomiting, yes passing gas, no Bowel Movement. Taking PO well. Denies SOB, Chest or Calf Pain. Using Incentive Spirometer, PAS in place. Ambulate WBAT, CPM 0-40 Patient reports pain as 4 on 0-10 scale  .    Objective: Vital signs in last 24 hours: Filed Vitals:   09/21/14 1929 09/21/14 1933 09/21/14 1945 09/22/14 0538  BP:  129/76  148/79  Pulse: 61  62 77  Temp:   97.3 F (36.3 C) 98.5 F (36.9 C)  TempSrc:    Oral  Resp: 13  16 16   SpO2: 88%  97% 98%      Intake/Output from previous day: I/O last 3 completed shifts: In: 2094.2 [P.O.:440; I.V.:1654.2] Out: 975 [Urine:975]   Intake/Output this shift:     LABORATORY DATA:  Recent Labs  09/22/14 0538  WBC 5.7  HGB 10.5*  HCT 31.7*  PLT 157  NA 134*  K 4.0  CL 102  CO2 27  BUN 14  CREATININE 0.73  GLUCOSE 153*  CALCIUM 8.4*    Examination: Neurologically intact ABD soft Neurovascular intact Sensation intact distally Intact pulses distally Dorsiflexion/Plantar flexion intact Incision: dressing C/D/I No cellulitis present Compartment soft}  Assessment:   1 Day Post-Op Procedure(s) (LRB): TOTAL KNEE ARTHROPLASTY (Left) ADDITIONAL DIAGNOSIS: Expected Acute Blood Loss Anemia,   Plan: PT/OT WBAT, CPM 5/hrs day until ROM 0-90 degrees, then D/C CPM DVT Prophylaxis:  SCDx72hrs, ASA 325 mg BID x 2 weeks DISCHARGE PLAN: Home DISCHARGE NEEDS: HHPT, CPM, Walker and 3-in-1 comode seat     Heidy Mccubbin J 09/22/2014, 7:46 AM

## 2014-09-22 NOTE — Evaluation (Signed)
Physical Therapy Evaluation Patient Details Name: Walter White MRN: [id number] DOB: [birth year] Today's Date: 09/22/2014   History of Present Illness  Pt is a 64 y/o F s/p Lt TKA.  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  Clinical Impression  Pt is s/p Lt TKA resulting in the deficits listed below (see PT Problem List). Walter White is motivated to become independent and ambulated 200 ft w/ min guard this session 2/2 Lt knee instability.  Emerging step through gait pattern w/ verbal cues for technique.  Walter White will have 24/7 supervision/assist from wife at d/c.  Pt will benefit from skilled PT to increase their independence and safety with mobility to allow discharge to the venue listed below.     Follow Up Recommendations Home health PT;Supervision for mobility/OOB    Equipment Recommendations  None recommended by PT    Recommendations for Other Services OT consult     Precautions / Restrictions Precautions Precautions: Knee;Fall Precaution Booklet Issued: Yes (comment) Precaution Comments: Reviewed knee precautions Required Braces or Orthoses: Other Brace/Splint Other Brace/Splint: Pt wears Rt wrist brace.  Torn ligaments/arthritis per pt Restrictions Weight Bearing Restrictions: Yes LLE Weight Bearing: Weight bearing as tolerated      Mobility  Bed Mobility Overal bed mobility: Modified Independent             General bed mobility comments: HOB elevated and use of bed rails w/ increased time.  No physical assist needed.  Transfers Overall transfer level: Needs assistance Equipment used: Rolling walker (2 wheeled) Transfers: Sit to/from Stand Sit to Stand: Min assist         General transfer comment: Min assist to stabilize RW during sit>stand.  Cues for proper technique as pt does not move RW w/ him during stand>sit, corrected once provided verbal cues.   Ambulation/Gait Ambulation/Gait  assistance: Min guard Ambulation Distance (Feet): 200 Feet Assistive device: Rolling walker (2 wheeled) Gait Pattern/deviations: Step-to pattern;Step-through pattern;Antalgic;Trunk flexed;Decreased weight shift to left;Decreased step length - right;Decreased stride length;Decreased stance time - left   Gait velocity interpretation: Below normal speed for age/gender General Gait Details: Min guard 2/2 Lt knee instability, no true knee buckle noted.  Cues for emerging step through gait pattern and to stand upright.  Cues for Lt knee extension and quad activation during Lt stance phase.  Stairs            Wheelchair Mobility    Modified Rankin (Stroke Patients Only)       Balance Overall balance assessment: Needs assistance Sitting-balance support: No upper extremity supported;Feet supported Sitting balance-Leahy Scale: Good     Standing balance support: Bilateral upper extremity supported;During functional activity Standing balance-Leahy Scale: Fair                               Pertinent Vitals/Pain Pain Assessment: 0-10 Pain Score: 6  Pain Location: Lt knee Pain Descriptors / Indicators: Aching Pain Intervention(s): Limited activity within patient's tolerance;Monitored during session;Repositioned;Patient requesting pain meds-RN notified    Home Living Family/patient expects to be discharged to:: Private residence Living Arrangements: Spouse/significant other Available Help at Discharge: Family;Available 24 hours/day Type of Home: House Home Access: Stairs to enter Entrance Stairs-Rails: None Entrance Stairs-Number of Steps: 1 Home Layout: One level Home Equipment: Walker - 2 wheels;Cane - single point;Bedside commode;Shower seat      Prior Function Level of Independence: Independent  Hand Dominance        Extremity/Trunk Assessment   Upper Extremity Assessment: Defer to OT evaluation           Lower Extremity  Assessment: LLE deficits/detail   LLE Deficits / Details: weakness and limited ROM as expected s/p Lt TKA  Cervical / Trunk Assessment: Other exceptions  Communication   Communication: No difficulties  Cognition Arousal/Alertness: Awake/alert Behavior During Therapy: WFL for tasks assessed/performed Overall Cognitive Status: Within Functional Limits for tasks assessed                      General Comments      Exercises Total Joint Exercises Ankle Circles/Pumps: AROM;Both;10 reps;Supine Quad Sets: Strengthening;Both;10 reps;Supine Heel Slides: AROM;Left;5 reps;Supine Knee Flexion: AROM;Left;5 reps;Seated Goniometric ROM: 6-87      Assessment/Plan    PT Assessment Patient needs continued PT services  PT Diagnosis Abnormality of gait;Difficulty walking;Generalized weakness;Acute pain   PT Problem List Decreased strength;Decreased range of motion;Decreased activity tolerance;Decreased mobility;Decreased balance;Decreased knowledge of use of DME;Decreased safety awareness;Decreased knowledge of precautions;Decreased skin integrity;Pain  PT Treatment Interventions DME instruction;Gait training;Stair training;Functional mobility training;Therapeutic activities;Therapeutic exercise;Balance training;Neuromuscular re-education;Patient/family education;Modalities   PT Goals (Current goals can be found in the Care Plan section) Acute Rehab PT Goals Patient Stated Goal: to go home once ready PT Goal Formulation: With patient Time For Goal Achievement: 09/29/14 Potential to Achieve Goals: Good    Frequency 7X/week   Barriers to discharge Inaccessible home environment 1 step to enter home    Co-evaluation               End of Session Equipment Utilized During Treatment: Gait belt Activity Tolerance: Patient tolerated treatment well Patient left: in chair;with call bell/phone within reach Nurse Communication: Mobility status;Precautions;Weight bearing status          Time: [id number] PT Time Calculation (min) (ACUTE ONLY): 38 min   Charges:   PT Evaluation $Initial PT Evaluation Tier I: 1 Procedure PT Treatments $Gait Training: 8-22 mins $Therapeutic Exercise: 8-22 mins   PT G Codes:       Joslyn Hy PT, DPT [phone] Pager: [phone] 09/22/2014, 10:08 AM

## 2014-09-22 NOTE — Care Management Note (Signed)
Case Management Note  Patient Details  Name: Walter White MRN: [id number] Date of Birth: 15-Jun-1950  Subjective/Objective:           S/p left total knee arthroplasty         Action/Plan: Set up with [facility] for HHPT by MD office. Spoke with patient and wife, no change in discharge plan. [employer] delivered CPM, rolling walker and 3N1 to patient's home. Patient and wife stated that wife will be available to assist patient after discharge.  Expected Discharge Date:                  Expected Discharge Plan:  [place]  In-House Referral:  NA  Discharge planning Services  CM Consult  Post Acute Care Choice:  Home Health, Durable Medical Equipment Choice offered to:  Patient  DME Arranged:  3-N-1, CPM, Walker rolling DME Agency:  [employer]  HH Arranged:  PT HH Agency:  [facility]  Status of Service:  Completed, signed off  Medicare Important Message Given:    Date Medicare IM Given:    Medicare IM give by:    Date Additional Medicare IM Given:    Additional Medicare Important Message give by:     If discussed at [place] of Stay Meetings, dates discussed:    Additional Comments:  Nila Nephew, RN 09/22/2014, 2:20 PM

## 2014-09-22 NOTE — Progress Notes (Signed)
Orthopedic Tech Progress Note Patient Details:  Walter White 03/26/1950 [id number] On cpm at 7:00 pm increased 10 degrees 0-50 Patient ID: [name], male   DOB: 10-28-1950, 64 y.o.   MRN: [id number]   Braulio Bosch 09/22/2014, 6:52 PM

## 2014-09-22 NOTE — Progress Notes (Signed)
Physical Therapy Treatment Patient Details Name: Walter White MRN: [id number] DOB: 1950-08-14 Today's Date: 09/22/2014    History of Present Illness Pt is a 64 y/o F s/p Lt TKA.  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    PT Comments    Walter White was limited by increased pain this afternoon; however, ambulated 200 ft in hallway w/ min guard assist.  His wife was present this session who was also educated on pt's knee precautions and car transfer.  Pt will benefit from continued skilled PT services to increase functional independence and safety.   Follow Up Recommendations  Home health PT;Supervision for mobility/OOB     Equipment Recommendations  None recommended by PT    Recommendations for Other Services OT consult     Precautions / Restrictions Precautions Precautions: Knee;Fall Precaution Comments: Reviewed knee precautions Required Braces or Orthoses: Other Brace/Splint Other Brace/Splint: Pt wears Rt wrist brace.  Torn ligaments/arthritis per pt Restrictions Weight Bearing Restrictions: Yes LLE Weight Bearing: Weight bearing as tolerated    Mobility  Bed Mobility Overal bed mobility: Modified Independent             General bed mobility comments: HOB flat and no use of bed rail to simulate home environment for supine>sit.  Pt w/ increased time and reminders to not hold breath.  Transfers Overall transfer level: Needs assistance Equipment used: Rolling walker (2 wheeled) Transfers: Sit to/from Stand Sit to Stand: Supervision         General transfer comment: Supervision for safety along w/ verbal cues for hand placement as pt attempts to pull on RW.  Ambulation/Gait Ambulation/Gait assistance: Min guard Ambulation Distance (Feet): 200 Feet Assistive device: Rolling walker (2 wheeled) Gait Pattern/deviations: Step-to pattern;Step-through pattern;Antalgic;Trunk flexed;Decreased weight  shift to left;Decreased stride length;Decreased stance time - left   Gait velocity interpretation: Below normal speed for age/gender General Gait Details: Cues to stand upright and to relax shoulders.  Pt w/ emerging step through gait pattern.   Stairs            Wheelchair Mobility    Modified Rankin (Stroke Patients Only)       Balance Overall balance assessment: Needs assistance Sitting-balance support: No upper extremity supported;Feet supported Sitting balance-Leahy Scale: Good     Standing balance support: Bilateral upper extremity supported;During functional activity Standing balance-Leahy Scale: Fair Standing balance comment: Pt able to doff gown standing in front of chair w/o assist                    Cognition Arousal/Alertness: Awake/alert Behavior During Therapy: WFL for tasks assessed/performed Overall Cognitive Status: Within Functional Limits for tasks assessed                      Exercises Total Joint Exercises Ankle Circles/Pumps: AROM;Both;10 reps;Supine Quad Sets: Strengthening;Both;10 reps;Supine Straight Leg Raises: AAROM;Left;5 reps;Supine Long Arc Quad: AROM;AAROM;Left;5 reps;Seated    General Comments        Pertinent Vitals/Pain Pain Assessment: 0-10 Pain Score: 7  Pain Location: Lt knee Pain Descriptors / Indicators: Aching;Discomfort;Grimacing;Constant Pain Intervention(s): Limited activity within patient's tolerance;Monitored during session;Repositioned    Home Living                      Prior Function            PT Goals (current goals can now be found in the care  plan section) Acute Rehab PT Goals Patient Stated Goal: to go home once ready PT Goal Formulation: With patient Time For Goal Achievement: 09/29/14 Potential to Achieve Goals: Good Progress towards PT goals: Progressing toward goals    Frequency  7X/week    PT Plan Current plan remains appropriate    Co-evaluation              End of Session Equipment Utilized During Treatment: Gait belt Activity Tolerance: Patient limited by pain Patient left: in chair;with call bell/phone within reach;with family/visitor present     Time: 7681-1572 PT Time Calculation (min) (ACUTE ONLY): 22 min  Charges:  $Gait Training: 8-22 mins                    G Codes:      Joslyn Hy PT, [place] [phone] Pager: [phone] 09/22/2014, 4:13 PM

## 2014-09-22 NOTE — Evaluation (Signed)
Occupational Therapy Evaluation Patient Details Name: Walter White MRN: [id number] DOB: 1950-11-20 Today's Date: 09/22/2014    History of Present Illness Pt is a 64 y/o F s/p Lt TKA.  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   Clinical Impression   Pt was independent prior to admission.  Presents with expected L knee pain and impaired balance in standing requiring min guard assist for ADL and mobility.  All education completed.  No further OT needs.    Follow Up Recommendations  No OT follow up    Equipment Recommendations  None recommended by OT    Recommendations for Other Services       Precautions / Restrictions Precautions Precautions: Knee;Fall Precaution Booklet Issued: Yes (comment) Precaution Comments: Reviewed knee precautions Required Braces or Orthoses: Other Brace/Splint Other Brace/Splint: Pt wears Rt wrist brace.  Torn ligaments/arthritis per pt Restrictions Weight Bearing Restrictions: Yes LLE Weight Bearing: Weight bearing as tolerated      Mobility Bed Mobility              General bed mobility comments: pt in chair  Transfers Overall transfer level: Needs assistance Equipment used: Rolling walker (2 wheeled) Transfers: Sit to/from Stand Sit to Stand: Min guard         General transfer comment: from chair, verbal cues for hand placement    Balance Overall balance assessment: Needs assistance Sitting-balance support: No upper extremity supported;Feet supported Sitting balance-Leahy Scale: Good     Standing balance support: Bilateral upper extremity supported;During functional activity Standing balance-Leahy Scale: Fair                              ADL Overall ADL's : Needs assistance/impaired Eating/Feeding: Independent;Sitting   Grooming: Wash/dry hands;Standing;Min guard   Upper Body Bathing: Set up;Sitting   Lower Body Bathing: Min guard;Sit  to/from stand Lower Body Bathing Details (indicate cue type and reason): recommended long bath sponge Upper Body Dressing : Set up;Sitting   Lower Body Dressing: Min guard;Sit to/from stand   Toilet Transfer: Min guard;Ambulation;RW Toilet Transfer Details (indicate cue type and reason): educated pt in use of 3 in1 over toilet at home to elevate Toileting- Water quality scientist and Hygiene: Min guard;Sit to/from stand         General ADL Comments: recommended pt consider purchasing a reacher as he has back problems, instructed in transporting items with RW and in safe footwear     Vision     Perception     Praxis      Pertinent Vitals/Pain Pain Assessment: Faces Pain Score: 6  Faces Pain Scale: Hurts even more Pain Location: L knee Pain Descriptors / Indicators: Aching Pain Intervention(s): Ice applied;Repositioned;RN gave pain meds during session     Hand Dominance Right   Extremity/Trunk Assessment Upper Extremity Assessment Upper Extremity Assessment: RUE deficits/detail RUE Deficits / Details: torn ligaments in wrist, using wrist cock up splint with walker   Lower Extremity Assessment Lower Extremity Assessment: Defer to PT evaluation LLE Deficits / Details: weakness and limited ROM as expected s/p Lt TKA LLE Sensation:  (WNL)   Cervical / Trunk Assessment Cervical / Trunk Assessment: Other exceptions Cervical / Trunk Exceptions: spinal stenosis   Communication Communication Communication: No difficulties   Cognition Arousal/Alertness: Awake/alert Behavior During Therapy: WFL for tasks assessed/performed Overall Cognitive Status: Within Functional Limits for tasks assessed  General Comments       Exercises      Shoulder Instructions      Home Living Family/patient expects to be discharged to:: Private residence Living Arrangements: Spouse/significant other Available Help at Discharge: Family;Available 24 hours/day Type  of Home: House Home Access: Stairs to enter [employer] of Steps: 1 Entrance Stairs-Rails: None Home Layout: One level     Bathroom Shower/Tub: Occupational psychologist: Standard (has both)     Home Equipment: Environmental consultant - 2 wheels;Cane - single point;Bedside commode;Shower seat;Shower seat - built in;Hand held shower head          Prior Functioning/Environment Level of Independence: Independent             OT Diagnosis:     OT Problem List:     OT Treatment/Interventions:      OT Goals(Current goals can be found in the care plan section) Acute Rehab OT Goals Patient Stated Goal: to go home once ready  OT Frequency:     Barriers to D/C:            Co-evaluation              End of Session Equipment Utilized During Treatment: Gait belt;Rolling walker CPM Left Knee CPM Left Knee: Off  Activity Tolerance: Patient tolerated treatment well Patient left: in chair;with call bell/phone within reach;with family/visitor present;with nursing/sitter in room   Time: 1013-1036 OT Time Calculation (min): 23 min Charges:  OT General Charges $OT Visit: 1 Procedure OT Evaluation $Initial OT Evaluation Tier I: 1 Procedure G-Codes:    Malka So 09/22/2014, 10:43 AM

## 2014-09-23 LAB — CBC
HCT: 29.5 % — ABNORMAL LOW (ref 39.0–52.0)
Hemoglobin: 9.7 g/dL — ABNORMAL LOW (ref 13.0–17.0)
MCH: 30.2 pg (ref 26.0–34.0)
MCHC: 32.9 g/dL (ref 30.0–36.0)
MCV: 91.9 fL (ref 78.0–100.0)
PLATELETS: 132 10*3/uL — AB (ref 150–400)
RBC: 3.21 MIL/uL — AB (ref 4.22–5.81)
RDW: 13.7 % (ref 11.5–15.5)
WBC: 4.6 10*3/uL (ref 4.0–10.5)

## 2014-09-23 NOTE — Progress Notes (Signed)
PATIENT ID: Walter White  MRN: [id number]  DOB/AGE:  66-29-1952 / 65 y.o.  2 Days Post-Op Procedure(s) (LRB): TOTAL KNEE ARTHROPLASTY (Left)    PROGRESS NOTE Subjective: Patient is alert, oriented, no Nausea, no Vomiting, yes passing gas, no Bowel Movement. Taking PO well. Denies SOB, Chest or Calf Pain. Using Incentive Spirometer, PAS in place. Ambulate WBAT with pt walking 200 ft x 2 with therapy yesterday, CPM 0-50 Patient reports pain as mild  .    Objective: Vital signs in last 24 hours: Filed Vitals:   09/22/14 0538 09/22/14 1430 09/22/14 2230 09/23/14 0509  BP: 148/79 127/65 126/71 125/67  Pulse: 77 74 89 87  Temp: 98.5 F (36.9 C) 99.7 F (37.6 C) 98.9 F (37.2 C) 99 F (37.2 C)  TempSrc: Oral  Oral Oral  Resp: 16 18 18 18   SpO2: 98% 97% 94% 97%      Intake/Output from previous day: I/O last 3 completed shifts: In: 1934.2 [P.O.:1280; I.V.:654.2] Out: 1875 [Urine:1875]   Intake/Output this shift:     LABORATORY DATA:  Recent Labs  09/22/14 0538 09/23/14 0436  WBC 5.7 4.6  HGB 10.5* 9.7*  HCT 31.7* 29.5*  PLT 157 132*  NA 134*  --   K 4.0  --   CL 102  --   CO2 27  --   BUN 14  --   CREATININE 0.73  --   GLUCOSE 153*  --   CALCIUM 8.4*  --     Examination: Neurologically intact Neurovascular intact Sensation intact distally Intact pulses distally Dorsiflexion/Plantar flexion intact Incision: dressing C/D/I No cellulitis present Compartment soft}  Assessment:   2 Days Post-Op Procedure(s) (LRB): TOTAL KNEE ARTHROPLASTY (Left) ADDITIONAL DIAGNOSIS: Expected Acute Blood Loss Anemia,   Plan: PT/OT WBAT, CPM 5/hrs day until ROM 0-90 degrees, then D/C CPM DVT Prophylaxis:  SCDx72hrs, ASA 325 mg BID x 2 weeks DISCHARGE PLAN: Home, later today DISCHARGE NEEDS: HHPT, CPM, Walker and 3-in-1 comode seat     Elba Schaber R 09/23/2014, 8:01 AM

## 2014-09-23 NOTE — Progress Notes (Signed)
Pt ready for d/c per MD. Cleared by PT/OT, equipment has been delivered. Prescriptions and discharge teaching given to pt and wife at bedside, all questions answered. Belongings gathered and removed by wife.   [place], Jerry Caras

## 2014-09-23 NOTE — Progress Notes (Signed)
Physical Therapy Treatment Patient Details Name: Walter White MRN: [id number] DOB: [birth year] Today's Date: 09/23/2014    History of Present Illness Pt is a 64 y/o F s/p Lt TKA.  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    PT Comments    Walter White performed stair training w/ assist from wife and ambulated in hallway w/ supervision.  He is appropriate for d/c from a mobility standpoint.  Pt will benefit from continued skilled PT services to increase functional independence and safety.   Follow Up Recommendations  Home health PT;Supervision for mobility/OOB     Equipment Recommendations  None recommended by PT    Recommendations for Other Services       Precautions / Restrictions Precautions Precautions: Knee;Fall Precaution Comments: Reviewed knee precautions Required Braces or Orthoses: Other Brace/Splint Other Brace/Splint: Pt wears Rt wrist brace.  Torn ligaments/arthritis per pt Restrictions Weight Bearing Restrictions: Yes LLE Weight Bearing: Weight bearing as tolerated    Mobility  Bed Mobility               General bed mobility comments: Pt sitting in recliner upon PT arrival  Transfers Overall transfer level: Needs assistance Equipment used: Rolling walker (2 wheeled) Transfers: Sit to/from Stand Sit to Stand: Supervision         General transfer comment: Supervision for safety.  On first sit>stand attempt pt has both hands on RW.  Verbal cues provided for proper technique and pt pushed up from armrests.  Ambulation/Gait Ambulation/Gait assistance: Supervision Ambulation Distance (Feet): 250 Feet Assistive device: Rolling walker (2 wheeled) Gait Pattern/deviations: Step-to pattern;Step-through pattern;Antalgic;Trunk flexed;Decreased stride length;Decreased weight shift to left   Gait velocity interpretation: Below normal speed for age/gender General Gait Details: Cues to stand  upright and to relax shoulders.  Pt w/ emerging step through gait pattern.   Stairs Stairs: Yes Stairs assistance: Min assist Stair Management: No rails;Backwards;With walker Number of Stairs: 1 General stair comments: Min assist from PT and wife to stablize RW.  Cues for technique.    Wheelchair Mobility    Modified Rankin (Stroke Patients Only)       Balance Overall balance assessment: Needs assistance Sitting-balance support: No upper extremity supported;Feet supported Sitting balance-Leahy Scale: Good     Standing balance support: Bilateral upper extremity supported;During functional activity Standing balance-Leahy Scale: Fair                      Cognition Arousal/Alertness: Awake/alert Behavior During Therapy: WFL for tasks assessed/performed Overall Cognitive Status: Within Functional Limits for tasks assessed                      Exercises Total Joint Exercises Quad Sets: Strengthening;Both;10 reps;Seated;PROM (provided light manual pressure) Straight Leg Raises: AAROM;Left;5 reps;Seated Knee Flexion: AROM;AAROM;Left;5 reps;Seated Goniometric ROM: 4-91    General Comments        Pertinent Vitals/Pain Pain Assessment: 0-10 Pain Score: 5  Pain Location: Lt knee Pain Descriptors / Indicators: Aching;Discomfort Pain Intervention(s): Limited activity within patient's tolerance;Monitored during session;Repositioned    Home Living                      Prior Function            PT Goals (current goals can now be found in the care plan section) Acute Rehab PT Goals Patient Stated Goal: to go home today PT Goal Formulation:  With patient Time For Goal Achievement: 09/29/14 Potential to Achieve Goals: Good Progress towards PT goals: Progressing toward goals    Frequency  7X/week    PT Plan Current plan remains appropriate    Co-evaluation             End of Session Equipment Utilized During Treatment: Gait  belt Activity Tolerance: Patient tolerated treatment well Patient left: in chair;with call bell/phone within reach;with family/visitor present     Time: 6815-9470 PT Time Calculation (min) (ACUTE ONLY): 25 min  Charges:  $Gait Training: 8-22 mins $Therapeutic Exercise: 8-22 mins                    G Codes:      Joslyn Hy PT, DPT [phone] Pager: [phone] 09/23/2014, 11:53 AM

## 2014-09-23 NOTE — Discharge Summary (Signed)
Patient ID: Walter White MRN: [id number] DOB/AGE: 1950-02-02 64 y.o.  Admit date: 09/21/2014 Discharge date: 09/23/2014  Admission Diagnoses:  Principal Problem:   Primary osteoarthritis of left knee Active Problems:   Arthritis of knee   Discharge Diagnoses:  Same  Past Medical History  Diagnosis Date  . Arthritis     spinal steniosis  . Spinal stenosis   . 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 Seasonal allergies   . Pneumonia     when very young  . Benign prostate hyperplasia   . GERD (gastroesophageal reflux disease)     Surgeries: Procedure(s): TOTAL KNEE ARTHROPLASTY on 09/21/2014   Consultants:    Discharged Condition: Improved  Hospital Course: Carlo Guevarra is an 64 y.o. male who was admitted 09/21/2014 for operative treatment ofPrimary osteoarthritis of left knee. Patient has severe unremitting pain that affects sleep, daily activities, and work/hobbies. After pre-op clearance the patient was taken to the operating room on 09/21/2014 and underwent  Procedure(s): TOTAL KNEE ARTHROPLASTY.    Patient was given perioperative antibiotics: Anti-infectives    Start     Dose/Rate Route Frequency Ordered Stop   09/21/14 1330  vancomycin (VANCOCIN) 1,500 mg in sodium chloride 0.9 % 500 mL IVPB  Status:  Discontinued     1,500 mg 250 mL/hr over 120 Minutes Intravenous To ShortStay Surgical 09/20/14 1332 09/21/14 1957   09/21/14 1328  cefUROXime (ZINACEF) injection  Status:  Discontinued       As needed 09/21/14 1328 09/21/14 1442   09/21/14 1045  ceFAZolin (ANCEF) 2-3 GM-% IVPB SOLR    Comments:  Gallman, Kathie   : cabinet override      09/21/14 1045 09/21/14 1304   09/20/14 1330  vancomycin (VANCOCIN) 1,500 mg in sodium chloride 0.9 % 500 mL IVPB  Status:  Discontinued     1,500 mg 250 mL/hr over 120 Minutes Intravenous To ShortStay Surgical 09/20/14 1323 09/20/14 1332        Patient was given sequential compression devices, early ambulation, and chemoprophylaxis to prevent DVT.  Patient benefited maximally from hospital stay and there were no complications.    Recent vital signs: Patient Vitals for the past 24 hrs:  BP Temp Temp src Pulse Resp SpO2  09/23/14 0509 125/67 mmHg 99 F (37.2 C) Oral 87 18 97 %  09/22/14 2230 126/71 mmHg 98.9 F (37.2 C) Oral 89 18 94 %  09/22/14 1430 127/65 mmHg 99.7 F (37.6 C) - 74 18 97 %     Recent laboratory studies:  Recent Labs  09/22/14 0538 09/23/14 0436  WBC 5.7 4.6  HGB 10.5* 9.7*  HCT 31.7* 29.5*  PLT 157 132*  NA 134*  --   K 4.0  --   CL 102  --   CO2 27  --   BUN 14  --   CREATININE 0.73  --   GLUCOSE 153*  --   CALCIUM 8.4*  --      Discharge Medications:     Medication List    TAKE these medications        aspirin EC 325 MG tablet  Take 1 tablet (325 mg total) by mouth 2 (two) times daily.     aspirin-acetaminophen-caffeine 600-459-97 MG per tablet  Commonly known as:  EXCEDRIN MIGRAINE  Take 1 tablet by mouth every 6 (six) hours as needed for headache.  diclofenac sodium 1 % Gel  Commonly known as:  VOLTAREN  Apply 1 g topically daily as needed (pain).     HYDROcodone-acetaminophen 5-325 MG per tablet  Commonly known as:  NORCO/VICODIN  Take 1 tablet by mouth every evening.     meloxicam 15 MG tablet  Commonly known as:  MOBIC  Take 15 mg by mouth every morning.     methocarbamol 500 MG tablet  Commonly known as:  ROBAXIN  Take 1 tablet (500 mg total) by mouth 2 (two) times daily with a meal.     oxyCODONE-acetaminophen 5-325 MG per tablet  Commonly known as:  ROXICET  Take 1 tablet by mouth every 4 (four) hours as needed.     tamsulosin 0.4 MG Caps capsule  Commonly known as:  FLOMAX  Take 0.4 mg by mouth every evening.     traMADol 50 MG tablet  Commonly known as:  ULTRAM  Take 50 mg by mouth every morning.        Diagnostic Studies: Dg Chest 2  View  09/10/2014   CLINICAL DATA:  Preoperative exam prior total knee arthroplasty, no history of cardiopulmonary abnormality.  EXAM: CHEST  2 VIEW  COMPARISON:  None in PACs  FINDINGS: The lungs are adequately inflated. There is no focal infiltrate. There is no pleural effusion. The cardiac silhouette is top-normal in size. The pulmonary vascularity is not engorged. The mediastinum is normal in width. There is mild tortuosity of the descending thoracic aorta. There is no pleural effusion.  IMPRESSION: There is no active cardiopulmonary disease.   Electronically Signed   By: David  Martinique M.D.   On: 09/10/2014 09:45    Disposition: 01-Home or Self Care      Discharge Instructions    CPM    Complete by:  As directed   Continuous passive motion machine (CPM):      Use the CPM from 0 to 60  for 5 hours per day.      You may increase by 10 degrees per day.  You may break it up into 2 or 3 sessions per day.      Use CPM for 2 weeks or until you are told to stop.     Call MD / Call 911    Complete by:  As directed   If you experience chest pain or shortness of breath, CALL 911 and be transported to the hospital emergency room.  If you develope a fever above 101 F, pus (white drainage) or increased drainage or redness at the wound, or calf pain, call your surgeon's office.     Change dressing    Complete by:  As directed   Change dressing on 5, then change the dressing daily with sterile 4 x 4 inch gauze dressing and apply TED hose.  You may clean the incision with alcohol prior to redressing.     Constipation Prevention    Complete by:  As directed   Drink plenty of fluids.  Prune juice may be helpful.  You may use a stool softener, such as Colace (over the counter) 100 mg twice a day.  Use MiraLax (over the counter) for constipation as needed.     Diet - low sodium heart healthy    Complete by:  As directed      Driving restrictions    Complete by:  As directed   No driving for 2 weeks      Increase activity slowly as tolerated  Complete by:  As directed      Patient may shower    Complete by:  As directed   You may shower without a dressing once there is no drainage.  Do not wash over the wound.  If drainage remains, cover wound with plastic wrap and then shower.           Follow-up Information    Follow up with Kerin Salen, MD In 2 weeks.   Specialty:  Orthopedic Surgery   Contact information:   [place] [address] [phone]       Follow up with [facility]   Why:  They will contact you to schedule home therapy visits.    Contact information:   [place] [address] [address] [phone]        Signed: Hardin Negus, Micheil Klaus R 09/23/2014, 8:04 AM

## 2014-09-24 DIAGNOSIS — Z87891 Personal history of nicotine dependence: Secondary | ICD-10-CM | POA: Diagnosis not present

## 2014-09-24 DIAGNOSIS — Z6838 Body mass index (BMI) 38.0-38.9, adult: Secondary | ICD-10-CM | POA: Diagnosis not present

## 2014-09-24 DIAGNOSIS — M1990 Unspecified osteoarthritis, unspecified site: Secondary | ICD-10-CM | POA: Diagnosis not present

## 2014-09-24 DIAGNOSIS — Z471 Aftercare following joint replacement surgery: Secondary | ICD-10-CM | POA: Diagnosis not present

## 2014-09-24 DIAGNOSIS — M4800 Spinal stenosis, site unspecified: Secondary | ICD-10-CM | POA: Diagnosis not present

## 2014-09-24 DIAGNOSIS — Z96652 Presence of left artificial knee joint: Secondary | ICD-10-CM | POA: Diagnosis not present

## 2014-09-25 DIAGNOSIS — M4800 Spinal stenosis, site unspecified: Secondary | ICD-10-CM | POA: Diagnosis not present

## 2014-09-25 DIAGNOSIS — M1990 Unspecified osteoarthritis, unspecified site: Secondary | ICD-10-CM | POA: Diagnosis not present

## 2014-09-25 DIAGNOSIS — Z87891 Personal history of nicotine dependence: Secondary | ICD-10-CM | POA: Diagnosis not present

## 2014-09-25 DIAGNOSIS — Z96652 Presence of left artificial knee joint: Secondary | ICD-10-CM | POA: Diagnosis not present

## 2014-09-25 DIAGNOSIS — Z6838 Body mass index (BMI) 38.0-38.9, adult: Secondary | ICD-10-CM | POA: Diagnosis not present

## 2014-09-25 DIAGNOSIS — Z471 Aftercare following joint replacement surgery: Secondary | ICD-10-CM | POA: Diagnosis not present

## 2014-09-25 NOTE — Anesthesia Postprocedure Evaluation (Signed)
  Anesthesia Post-op Note  Patient: Walter White  Procedure(s) Performed: Procedure(s) (LRB): TOTAL KNEE ARTHROPLASTY (Left)  Patient Location: PACU  Anesthesia Type: Spinal  Level of Consciousness: awake and alert   Airway and Oxygen Therapy: Patient Spontanous Breathing  Post-op Pain: mild  Post-op Assessment: Post-op Vital signs reviewed, Patient's Cardiovascular Status Stable, Respiratory Function Stable, Patent Airway and No signs of Nausea or vomiting  Last Vitals:  Filed Vitals:   09/23/14 0509  BP: 125/67  Pulse: 87  Temp: 37.2 C  Resp: 18    Post-op Vital Signs: stable   Complications: No apparent anesthesia complications

## 2014-09-28 DIAGNOSIS — Z87891 Personal history of nicotine dependence: Secondary | ICD-10-CM | POA: Diagnosis not present

## 2014-09-28 DIAGNOSIS — M4800 Spinal stenosis, site unspecified: Secondary | ICD-10-CM | POA: Diagnosis not present

## 2014-09-28 DIAGNOSIS — Z6838 Body mass index (BMI) 38.0-38.9, adult: Secondary | ICD-10-CM | POA: Diagnosis not present

## 2014-09-28 DIAGNOSIS — M1990 Unspecified osteoarthritis, unspecified site: Secondary | ICD-10-CM | POA: Diagnosis not present

## 2014-09-28 DIAGNOSIS — Z471 Aftercare following joint replacement surgery: Secondary | ICD-10-CM | POA: Diagnosis not present

## 2014-09-28 DIAGNOSIS — Z96652 Presence of left artificial knee joint: Secondary | ICD-10-CM | POA: Diagnosis not present

## 2014-09-30 DIAGNOSIS — M1990 Unspecified osteoarthritis, unspecified site: Secondary | ICD-10-CM | POA: Diagnosis not present

## 2014-09-30 DIAGNOSIS — Z471 Aftercare following joint replacement surgery: Secondary | ICD-10-CM | POA: Diagnosis not present

## 2014-09-30 DIAGNOSIS — M4800 Spinal stenosis, site unspecified: Secondary | ICD-10-CM | POA: Diagnosis not present

## 2014-09-30 DIAGNOSIS — Z96652 Presence of left artificial knee joint: Secondary | ICD-10-CM | POA: Diagnosis not present

## 2014-09-30 DIAGNOSIS — Z87891 Personal history of nicotine dependence: Secondary | ICD-10-CM | POA: Diagnosis not present

## 2014-09-30 DIAGNOSIS — Z6838 Body mass index (BMI) 38.0-38.9, adult: Secondary | ICD-10-CM | POA: Diagnosis not present

## 2014-10-01 DIAGNOSIS — Z6838 Body mass index (BMI) 38.0-38.9, adult: Secondary | ICD-10-CM | POA: Diagnosis not present

## 2014-10-01 DIAGNOSIS — M170 Bilateral primary osteoarthritis of knee: Secondary | ICD-10-CM | POA: Diagnosis not present

## 2014-10-01 DIAGNOSIS — Z471 Aftercare following joint replacement surgery: Secondary | ICD-10-CM | POA: Diagnosis not present

## 2014-10-01 DIAGNOSIS — M1990 Unspecified osteoarthritis, unspecified site: Secondary | ICD-10-CM | POA: Diagnosis not present

## 2014-10-01 DIAGNOSIS — Z87891 Personal history of nicotine dependence: Secondary | ICD-10-CM | POA: Diagnosis not present

## 2014-10-01 DIAGNOSIS — Z9889 Other specified postprocedural states: Secondary | ICD-10-CM | POA: Diagnosis not present

## 2014-10-01 DIAGNOSIS — Z96652 Presence of left artificial knee joint: Secondary | ICD-10-CM | POA: Diagnosis not present

## 2014-10-01 DIAGNOSIS — M4800 Spinal stenosis, site unspecified: Secondary | ICD-10-CM | POA: Diagnosis not present

## 2014-10-02 DIAGNOSIS — M6281 Muscle weakness (generalized): Secondary | ICD-10-CM | POA: Diagnosis not present

## 2014-10-02 DIAGNOSIS — Z96652 Presence of left artificial knee joint: Secondary | ICD-10-CM | POA: Diagnosis not present

## 2014-10-02 DIAGNOSIS — M25662 Stiffness of left knee, not elsewhere classified: Secondary | ICD-10-CM | POA: Diagnosis not present

## 2014-10-07 DIAGNOSIS — M25662 Stiffness of left knee, not elsewhere classified: Secondary | ICD-10-CM | POA: Diagnosis not present

## 2014-10-07 DIAGNOSIS — Z96652 Presence of left artificial knee joint: Secondary | ICD-10-CM | POA: Diagnosis not present

## 2014-10-07 DIAGNOSIS — M6281 Muscle weakness (generalized): Secondary | ICD-10-CM | POA: Diagnosis not present

## 2014-10-09 DIAGNOSIS — M6281 Muscle weakness (generalized): Secondary | ICD-10-CM | POA: Diagnosis not present

## 2014-10-09 DIAGNOSIS — M25662 Stiffness of left knee, not elsewhere classified: Secondary | ICD-10-CM | POA: Diagnosis not present

## 2014-10-09 DIAGNOSIS — Z96652 Presence of left artificial knee joint: Secondary | ICD-10-CM | POA: Diagnosis not present

## 2014-10-12 ENCOUNTER — Encounter: Payer: Self-pay | Admitting: Internal Medicine

## 2014-10-14 DIAGNOSIS — M6281 Muscle weakness (generalized): Secondary | ICD-10-CM | POA: Diagnosis not present

## 2014-10-14 DIAGNOSIS — M25662 Stiffness of left knee, not elsewhere classified: Secondary | ICD-10-CM | POA: Diagnosis not present

## 2014-10-14 DIAGNOSIS — Z96652 Presence of left artificial knee joint: Secondary | ICD-10-CM | POA: Diagnosis not present

## 2014-10-15 DIAGNOSIS — M6281 Muscle weakness (generalized): Secondary | ICD-10-CM | POA: Diagnosis not present

## 2014-10-15 DIAGNOSIS — Z96652 Presence of left artificial knee joint: Secondary | ICD-10-CM | POA: Diagnosis not present

## 2014-10-15 DIAGNOSIS — M25662 Stiffness of left knee, not elsewhere classified: Secondary | ICD-10-CM | POA: Diagnosis not present

## 2014-10-26 DIAGNOSIS — M6281 Muscle weakness (generalized): Secondary | ICD-10-CM | POA: Diagnosis not present

## 2014-10-26 DIAGNOSIS — M25662 Stiffness of left knee, not elsewhere classified: Secondary | ICD-10-CM | POA: Diagnosis not present

## 2014-10-26 DIAGNOSIS — Z96652 Presence of left artificial knee joint: Secondary | ICD-10-CM | POA: Diagnosis not present

## 2014-10-27 DIAGNOSIS — M1711 Unilateral primary osteoarthritis, right knee: Secondary | ICD-10-CM | POA: Diagnosis not present

## 2014-10-28 DIAGNOSIS — D485 Neoplasm of uncertain behavior of skin: Secondary | ICD-10-CM | POA: Diagnosis not present

## 2014-10-28 DIAGNOSIS — D2261 Melanocytic nevi of right upper limb, including shoulder: Secondary | ICD-10-CM | POA: Diagnosis not present

## 2014-10-28 DIAGNOSIS — L219 Seborrheic dermatitis, unspecified: Secondary | ICD-10-CM | POA: Diagnosis not present

## 2014-10-28 DIAGNOSIS — D225 Melanocytic nevi of trunk: Secondary | ICD-10-CM | POA: Diagnosis not present

## 2014-10-28 DIAGNOSIS — D1801 Hemangioma of skin and subcutaneous tissue: Secondary | ICD-10-CM | POA: Diagnosis not present

## 2014-10-28 DIAGNOSIS — Z1283 Encounter for screening for malignant neoplasm of skin: Secondary | ICD-10-CM | POA: Diagnosis not present

## 2014-10-28 DIAGNOSIS — L821 Other seborrheic keratosis: Secondary | ICD-10-CM | POA: Diagnosis not present

## 2014-10-29 DIAGNOSIS — Z96652 Presence of left artificial knee joint: Secondary | ICD-10-CM | POA: Diagnosis not present

## 2014-10-29 DIAGNOSIS — M6281 Muscle weakness (generalized): Secondary | ICD-10-CM | POA: Diagnosis not present

## 2014-10-29 DIAGNOSIS — M25662 Stiffness of left knee, not elsewhere classified: Secondary | ICD-10-CM | POA: Diagnosis not present

## 2014-11-02 DIAGNOSIS — M25662 Stiffness of left knee, not elsewhere classified: Secondary | ICD-10-CM | POA: Diagnosis not present

## 2014-11-02 DIAGNOSIS — Z96652 Presence of left artificial knee joint: Secondary | ICD-10-CM | POA: Diagnosis not present

## 2014-11-02 DIAGNOSIS — M6281 Muscle weakness (generalized): Secondary | ICD-10-CM | POA: Diagnosis not present

## 2014-11-04 DIAGNOSIS — M25662 Stiffness of left knee, not elsewhere classified: Secondary | ICD-10-CM | POA: Diagnosis not present

## 2014-11-04 DIAGNOSIS — D2261 Melanocytic nevi of right upper limb, including shoulder: Secondary | ICD-10-CM | POA: Diagnosis not present

## 2014-11-04 DIAGNOSIS — M6281 Muscle weakness (generalized): Secondary | ICD-10-CM | POA: Diagnosis not present

## 2014-11-04 DIAGNOSIS — Z96652 Presence of left artificial knee joint: Secondary | ICD-10-CM | POA: Diagnosis not present

## 2014-11-04 DIAGNOSIS — D485 Neoplasm of uncertain behavior of skin: Secondary | ICD-10-CM | POA: Diagnosis not present

## 2014-11-10 DIAGNOSIS — M25662 Stiffness of left knee, not elsewhere classified: Secondary | ICD-10-CM | POA: Diagnosis not present

## 2014-11-10 DIAGNOSIS — Z96652 Presence of left artificial knee joint: Secondary | ICD-10-CM | POA: Diagnosis not present

## 2014-11-10 DIAGNOSIS — M6281 Muscle weakness (generalized): Secondary | ICD-10-CM | POA: Diagnosis not present

## 2014-11-13 DIAGNOSIS — M25662 Stiffness of left knee, not elsewhere classified: Secondary | ICD-10-CM | POA: Diagnosis not present

## 2014-11-13 DIAGNOSIS — M6281 Muscle weakness (generalized): Secondary | ICD-10-CM | POA: Diagnosis not present

## 2014-11-13 DIAGNOSIS — Z96652 Presence of left artificial knee joint: Secondary | ICD-10-CM | POA: Diagnosis not present

## 2014-11-17 DIAGNOSIS — G5602 Carpal tunnel syndrome, left upper limb: Secondary | ICD-10-CM | POA: Diagnosis not present

## 2014-11-17 DIAGNOSIS — M6281 Muscle weakness (generalized): Secondary | ICD-10-CM | POA: Diagnosis not present

## 2014-11-17 DIAGNOSIS — G5601 Carpal tunnel syndrome, right upper limb: Secondary | ICD-10-CM | POA: Diagnosis not present

## 2014-11-17 DIAGNOSIS — M25662 Stiffness of left knee, not elsewhere classified: Secondary | ICD-10-CM | POA: Diagnosis not present

## 2014-11-17 DIAGNOSIS — M1711 Unilateral primary osteoarthritis, right knee: Secondary | ICD-10-CM | POA: Diagnosis not present

## 2014-11-17 DIAGNOSIS — Z96652 Presence of left artificial knee joint: Secondary | ICD-10-CM | POA: Diagnosis not present

## 2014-11-17 DIAGNOSIS — R972 Elevated prostate specific antigen [PSA]: Secondary | ICD-10-CM | POA: Diagnosis not present

## 2014-11-19 DIAGNOSIS — M6281 Muscle weakness (generalized): Secondary | ICD-10-CM | POA: Diagnosis not present

## 2014-11-19 DIAGNOSIS — M25662 Stiffness of left knee, not elsewhere classified: Secondary | ICD-10-CM | POA: Diagnosis not present

## 2014-11-19 DIAGNOSIS — Z96652 Presence of left artificial knee joint: Secondary | ICD-10-CM | POA: Diagnosis not present

## 2014-11-20 DIAGNOSIS — M4317 Spondylolisthesis, lumbosacral region: Secondary | ICD-10-CM | POA: Diagnosis not present

## 2014-11-20 DIAGNOSIS — M4806 Spinal stenosis, lumbar region: Secondary | ICD-10-CM | POA: Diagnosis not present

## 2014-11-23 DIAGNOSIS — M6281 Muscle weakness (generalized): Secondary | ICD-10-CM | POA: Diagnosis not present

## 2014-11-23 DIAGNOSIS — M25662 Stiffness of left knee, not elsewhere classified: Secondary | ICD-10-CM | POA: Diagnosis not present

## 2014-11-23 DIAGNOSIS — Z96652 Presence of left artificial knee joint: Secondary | ICD-10-CM | POA: Diagnosis not present

## 2014-11-24 DIAGNOSIS — N138 Other obstructive and reflux uropathy: Secondary | ICD-10-CM | POA: Diagnosis not present

## 2014-11-24 DIAGNOSIS — R972 Elevated prostate specific antigen [PSA]: Secondary | ICD-10-CM | POA: Diagnosis not present

## 2014-11-24 DIAGNOSIS — N401 Enlarged prostate with lower urinary tract symptoms: Secondary | ICD-10-CM | POA: Diagnosis not present

## 2014-11-24 DIAGNOSIS — M1711 Unilateral primary osteoarthritis, right knee: Secondary | ICD-10-CM | POA: Diagnosis not present

## 2014-11-25 DIAGNOSIS — Z96652 Presence of left artificial knee joint: Secondary | ICD-10-CM | POA: Diagnosis not present

## 2014-11-25 DIAGNOSIS — M25662 Stiffness of left knee, not elsewhere classified: Secondary | ICD-10-CM | POA: Diagnosis not present

## 2014-11-25 DIAGNOSIS — M6281 Muscle weakness (generalized): Secondary | ICD-10-CM | POA: Diagnosis not present

## 2014-12-01 DIAGNOSIS — M1611 Unilateral primary osteoarthritis, right hip: Secondary | ICD-10-CM | POA: Diagnosis not present

## 2014-12-01 DIAGNOSIS — M1711 Unilateral primary osteoarthritis, right knee: Secondary | ICD-10-CM | POA: Diagnosis not present

## 2014-12-07 ENCOUNTER — Ambulatory Visit (AMBULATORY_SURGERY_CENTER): Payer: Self-pay | Admitting: *Deleted

## 2014-12-07 VITALS — Ht 66.0 in | Wt 233.0 lb

## 2014-12-07 DIAGNOSIS — Z86010 Personal history of colonic polyps: Secondary | ICD-10-CM

## 2014-12-07 MED ORDER — SUPREP BOWEL PREP KIT 17.5-3.13-1.6 GM/177ML PO SOLN: 1.0000 | Freq: Once | ORAL | Status: DC

## 2014-12-07 NOTE — Progress Notes (Signed)
Patient denies any allergies to egg or soy products. Patient had hx of difficult intubation in the 1980s but has had multiple surgeries since with no problems with intubation.  Patient denies oxygen use at home and denies diet medications. Emmi instructions for colonoscopy but denied information.

## 2014-12-11 DIAGNOSIS — Z23 Encounter for immunization: Secondary | ICD-10-CM | POA: Diagnosis not present

## 2014-12-15 DIAGNOSIS — M1711 Unilateral primary osteoarthritis, right knee: Secondary | ICD-10-CM | POA: Diagnosis not present

## 2014-12-15 DIAGNOSIS — M1611 Unilateral primary osteoarthritis, right hip: Secondary | ICD-10-CM | POA: Diagnosis not present

## 2014-12-16 DIAGNOSIS — M4806 Spinal stenosis, lumbar region: Secondary | ICD-10-CM | POA: Diagnosis not present

## 2014-12-16 DIAGNOSIS — G5623 Lesion of ulnar nerve, bilateral upper limbs: Secondary | ICD-10-CM | POA: Diagnosis not present

## 2014-12-17 DIAGNOSIS — M4806 Spinal stenosis, lumbar region: Secondary | ICD-10-CM | POA: Diagnosis not present

## 2014-12-22 DIAGNOSIS — G5603 Carpal tunnel syndrome, bilateral upper limbs: Secondary | ICD-10-CM | POA: Diagnosis not present

## 2014-12-22 DIAGNOSIS — M1711 Unilateral primary osteoarthritis, right knee: Secondary | ICD-10-CM | POA: Diagnosis not present

## 2014-12-22 DIAGNOSIS — G5623 Lesion of ulnar nerve, bilateral upper limbs: Secondary | ICD-10-CM | POA: Diagnosis not present

## 2014-12-23 ENCOUNTER — Other Ambulatory Visit: Payer: Self-pay | Admitting: Orthopedic Surgery

## 2014-12-24 ENCOUNTER — Ambulatory Visit (AMBULATORY_SURGERY_CENTER): Payer: Medicare Other | Admitting: Internal Medicine

## 2014-12-24 ENCOUNTER — Encounter: Payer: Self-pay | Admitting: Internal Medicine

## 2014-12-24 VITALS — BP 128/93 | HR 68 | Temp 98.0°F | Resp 21 | Ht 66.0 in | Wt 233.0 lb

## 2014-12-24 DIAGNOSIS — D122 Benign neoplasm of ascending colon: Secondary | ICD-10-CM | POA: Diagnosis not present

## 2014-12-24 DIAGNOSIS — Z86010 Personal history of colonic polyps: Secondary | ICD-10-CM

## 2014-12-24 DIAGNOSIS — D123 Benign neoplasm of transverse colon: Secondary | ICD-10-CM

## 2014-12-24 MED ORDER — SODIUM CHLORIDE 0.9 % IV SOLN: 500.0000 mL | INTRAVENOUS | Status: DC

## 2014-12-24 NOTE — Patient Instructions (Addendum)
Impressions/recommendations:  Polyps (handout given)  Repeat colonoscopy in 3 years.  YOU HAD AN ENDOSCOPIC PROCEDURE TODAY AT [facility]:   Refer to the procedure report that was given to you for any specific questions about what was found during the examination.  If the procedure report does not answer your questions, please call your gastroenterologist to clarify.  If you requested that your care partner not be given the details of your procedure findings, then the procedure report has been included in a sealed envelope for you to review at your convenience later.  YOU SHOULD EXPECT: Some feelings of bloating in the abdomen. Passage of more gas than usual.  Walking can help get rid of the air that was put into your GI tract during the procedure and reduce the bloating. If you had a lower endoscopy (such as a colonoscopy or flexible sigmoidoscopy) you may notice spotting of blood in your stool or on the toilet paper. If you underwent a bowel prep for your procedure, you may not have a normal bowel movement for a few days.  Please Note:  You might notice some irritation and congestion in your nose or some drainage.  This is from the oxygen used during your procedure.  There is no need for concern and it should clear up in a day or so.  SYMPTOMS TO REPORT IMMEDIATELY:   Following lower endoscopy (colonoscopy or flexible sigmoidoscopy):  Excessive amounts of blood in the stool  Significant tenderness or worsening of abdominal pains  Swelling of the abdomen that is new, acute  Fever of 100F or higher  For urgent or emergent issues, a gastroenterologist can be reached at any hour by calling [phone].   DIET: Your first meal following the procedure should be a small meal and then it is ok to progress to your normal diet. Heavy or fried foods are harder to digest and may make you feel nauseous or bloated.  Likewise, meals heavy in dairy and vegetables can increase  bloating.  Drink plenty of fluids but you should avoid alcoholic beverages for 24 hours.  ACTIVITY:  You should plan to take it easy for the rest of today and you should NOT DRIVE or use heavy machinery until tomorrow (because of the sedation medicines used during the test).    FOLLOW UP: Our staff will call the number listed on your records the next business day following your procedure to check on you and address any questions or concerns that you may have regarding the information given to you following your procedure. If we do not reach you, we will leave a message.  However, if you are feeling well and you are not experiencing any problems, there is no need to return our call.  We will assume that you have returned to your regular daily activities without incident.  If any biopsies were taken you will be contacted by phone or by letter within the next 1-3 weeks.  Please call us at [phone] if you have not heard about the biopsies in 3 weeks.    SIGNATURES/CONFIDENTIALITY: You and/or your care partner have signed paperwork which will be entered into your electronic medical record.  These signatures attest to the fact that that the information above on your After Visit Summary has been reviewed and is understood.  Full responsibility of the confidentiality of this discharge information lies with you and/or your care-partner.

## 2014-12-24 NOTE — Progress Notes (Signed)
Called to room to assist during endoscopic procedure.  Patient ID and intended procedure confirmed with present staff. Received instructions for my participation in the procedure from the performing physician.  

## 2014-12-24 NOTE — Op Note (Signed)
[facility]  [address] [place], [id number]   COLONOSCOPY PROCEDURE REPORT  PATIENT: Walter White, Walter White  MR#: [id number] BIRTHDATE: [birth year] , 15  yrs. old GENDER: male ENDOSCOPIST: Eustace Quail, MD REFERRED [id number] Program Recall PROCEDURE DATE:  12/24/2014 PROCEDURE:   Colonoscopy, surveillance and Colonoscopy with snare polypectomy x 3 First Screening Colonoscopy - Avg.  risk and is 50 yrs.  old or older - No.  Prior Negative Screening - Now for repeat screening. N/A  History of Adenoma - Now for follow-up colonoscopy & has been > or = to 3 yrs.  Yes hx of adenoma.  Has been 3 or more years since last colonoscopy.  Polyps removed today? Yes ASA CLASS:   Class II INDICATIONS:Surveillance due to prior colonic neoplasia and PH Colon Adenoma.. Index examination April 2010 and follow-up examination June 2013 with MULTIPLE small adenomas MEDICATIONS: Monitored anesthesia care and Propofol 300 mg IV  DESCRIPTION OF PROCEDURE:   After the risks benefits and alternatives of the procedure were thoroughly explained, informed consent was obtained.  The digital rectal exam revealed no abnormalities of the rectum.   The LB [id number]  endoscope was introduced through the anus and advanced to the cecum, which was identified by both the appendix and ileocecal valve. No adverse events experienced.   The quality of the prep was good.  (Suprep was used)  The instrument was then slowly withdrawn as the colon was fully examined. Estimated blood loss is zero unless otherwise noted in this procedure report.   COLON FINDINGS: Three polyps measuring 4 mm in size were found in the transverse colon and ascending colon.  A polypectomy was performed with a cold snare.  The resection was complete, the polyp tissue was completely retrieved and sent to histology.   The examination was otherwise normal.  Retroflexed views revealed no abnormalities. The time to cecum = 2.1  Withdrawal time = 13.6   The scope was withdrawn and the procedure completed. COMPLICATIONS: There were no immediate complications.  ENDOSCOPIC IMPRESSION: 1.   Three polyps were found in the transverse colon and ascending colon; polypectomy was performed with a cold snare 2.   The examination was otherwise normal  RECOMMENDATIONS: 1. Repeat Colonoscopy in 3 years.  eSigned:  Eustace Quail, MD 12/24/2014 8:46 AM   cc: The Patient and Thressa Sheller, MD

## 2014-12-24 NOTE — Progress Notes (Signed)
Report to PACU, RN, vss, BBS= Clear.  

## 2014-12-25 ENCOUNTER — Telehealth: Payer: Self-pay | Admitting: *Deleted

## 2014-12-25 NOTE — Telephone Encounter (Signed)
  Follow up Call-  Call back number 12/24/2014  Post procedure Call Back phone  # [phone]  Permission to leave phone message Yes     No answer at # given.  Left message on vm.

## 2014-12-29 ENCOUNTER — Encounter: Payer: Self-pay | Admitting: Internal Medicine

## 2014-12-30 ENCOUNTER — Ambulatory Visit (INDEPENDENT_AMBULATORY_CARE_PROVIDER_SITE_OTHER): Payer: Medicare Other | Admitting: Neurology

## 2014-12-30 ENCOUNTER — Encounter: Payer: Self-pay | Admitting: Neurology

## 2014-12-30 VITALS — BP 158/88 | HR 90 | Ht 66.0 in | Wt 230.0 lb

## 2014-12-30 DIAGNOSIS — G629 Polyneuropathy, unspecified: Secondary | ICD-10-CM

## 2014-12-30 DIAGNOSIS — R202 Paresthesia of skin: Secondary | ICD-10-CM | POA: Diagnosis not present

## 2014-12-30 DIAGNOSIS — M5412 Radiculopathy, cervical region: Secondary | ICD-10-CM

## 2014-12-30 DIAGNOSIS — G5622 Lesion of ulnar nerve, left upper limb: Secondary | ICD-10-CM | POA: Diagnosis not present

## 2014-12-30 DIAGNOSIS — R531 Weakness: Secondary | ICD-10-CM

## 2014-12-30 DIAGNOSIS — M545 Low back pain: Secondary | ICD-10-CM | POA: Diagnosis not present

## 2014-12-30 DIAGNOSIS — I10 Essential (primary) hypertension: Secondary | ICD-10-CM | POA: Diagnosis not present

## 2014-12-30 MED ORDER — GABAPENTIN 300 MG PO CAPS: 300.0000 mg | ORAL_CAPSULE | Freq: Three times a day (TID) | ORAL | Status: DC

## 2014-12-30 NOTE — Progress Notes (Signed)
[facility]    Provider:  Dr Jaynee Eagles Referring Provider: Thressa Sheller, MD Primary Care Physician:  Thressa Sheller, MD  CC:  Paresthesias in the hands  HPI:  Walter White is a 64 y.o. male here as a referral from Dr. Noah Delaine for numbness in digits 3-4 of both hands. This started the day after knee surgery. . The knee surgery was September 12th. Symptoms are getting worse. He sleeps in a recliner. Symptoms worse at night. He wakes up and his whole hand is numb. Might sleep with his elbows bent sometimes. When he is driving he can't feel the secon digits of his hands. Burning, numbness and tingling. It wakes him up at night. He has had ulnar  Transpositions in the past. There was no padding on the chair recliners and he was using his elbows to push up. Starts in the elbow with burning. Right is worse than the left. Nothing makes the pain better which can be severe. No weakness in the hands. He had pain his neck with radiation. Otherwise no other neurologic symptoms/complaints.  He does drop objects occasionally.symptoms are fairly continuous. He has lower back pain and leg pain and his lumbar spine MRI shows spinal stenosis.  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 He had a right ulnar nerve release done in 2015. EMG studies from the year 2015 compared to those of December 2016 on the right side his latencies have either improved for the median and stayed the  the same for the ulnar nerve. On .the left side the patient does have prolonged latencies consistent with the cubital tunnel and carpal tunnel entrapment.He has lower back pain and leg pain and his lumbar spine MRI shows spinal stenosis.  Right median motor nerve conduction study demonstrated delayed distal latency with slowed  conduction velocity.left median motor nerve conduction study demonstrated delayed distal latency with reduced C map amplitude and slowed conduction velocity. Right ulnar motor nerv conduction study demonstrated delayed distal latency,reduced  cmap amplitude, slowed conduction velocity around the elbow.  Left ulnar motor nerve conduction study demonstrated normal distal latency with reduced CMAP amplitude slowed conduction velocity around the elbow. Median sensory and ulnar sensory conductions showed delayad distal latencies.  Needle EMG study demonstrated denervtion with reinnervation and biateral FDI, ADQ and FCU muscles.normal in bilateral APB, brachioradialis, pronator teres, biceps, tricepsin cervical paraspinal musces.   Review of Systems: Patient complains of symptoms per HPI as well as the following symptoms: joint pain, cramps, allergies, numbness, weakness, not enough sleep. Pertinent negatives per HPI. All others negative.   Social History   Social History  . Marital Status: Married    Spouse Name: N/A  . Number of Children: N/A  . Years of Education: N/A   Occupational History  . Not on file.   Social History Main Topics  . Smoking status: Former Smoker    Quit date: 11/21/1980  . Smokeless tobacco: Never Used  . Alcohol Use: No     Comment: rare  . Drug Use: No  . Sexual Activity: Not on file   Other Topics Concern  . Not on file   Social History Narrative    Family History  Problem Relation Age of Onset  . Colon cancer Neg Hx   . Colon polyps Neg Hx   . Esophageal cancer Neg  Hx   . Rectal cancer Neg Hx   . Stomach cancer Neg Hx     Past Medical History  Diagnosis Date  . Arthritis     spinal steniosis  . Spinal stenosis   . 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 Seasonal allergies   . Pneumonia     Hx -when very young  . Benign prostate hyperplasia   .  GERD (gastroesophageal reflux disease)     Past Surgical History  Procedure Laterality Date  . Knee arthroscopy  2010    rt   . Knee arthroscopy      left x 2  . Knee arthroscopy  11/23/2010    Procedure: ARTHROSCOPY KNEE;  Surgeon: Kerin Salen;  Location: [facility];  Service: Orthopedics;  Laterality: Left;  arthroscopy left knee  . Polypectomy  2010  . Wisdom tooth extraction    . Total knee arthroplasty Left 09/21/2014    Procedure: TOTAL KNEE ARTHROPLASTY;  Surgeon: Frederik Pear, MD;  Location: [facility];  Service: Orthopedics;  Laterality: Left;  . Colonoscopy    . Colonoscopy  2010    polyp  . Carpal tunnel release      right   . Elbow surgery      right ulnar release    Current Outpatient Prescriptions  Medication Sig Dispense Refill  . diclofenac sodium (VOLTAREN) 1 % GEL Apply 1 g topically daily as needed (pain).    [name] docusate sodium (COLACE) 100 MG capsule Take 100 mg by mouth daily.    [name] HYDROcodone-acetaminophen (NORCO/VICODIN) 5-325 MG per tablet Take 1 tablet by mouth every evening.    . meloxicam (MOBIC) 15 MG tablet Take 15 mg by mouth every morning.     . methocarbamol (ROBAXIN) 500 MG tablet Take 1 tablet (500 mg total) by mouth 2 (two) times daily with a meal. 60 tablet 0  . oxyCODONE-acetaminophen (PERCOCET/ROXICET) 5-325 MG tablet     . Tamsulosin HCl (FLOMAX) 0.4 MG CAPS Take 0.4 mg by mouth every evening.     . traMADol (ULTRAM) 50 MG tablet Take 50 mg by mouth every morning.      No current facility-administered medications for this visit.    Allergies as of 12/30/2014 - Review Complete 12/24/2014  Allergen Reaction Noted  . Penicillins  11/22/2010    Vitals: There were no vitals taken for this visit. Last Weight:  Wt Readings from Last 1 Encounters:  12/24/14 233 lb (105.688 kg)   Last Height:   Ht Readings from Last 1 Encounters:  12/24/14 5\' 6"  (1.676 m)    Physical exam: Exam: Gen: NAD, conversant, well nourised, obese,  well groomed                     CV: RRR, no MRG. No Carotid Bruits. No peripheral edema, warm, nontender Eyes: Conjunctivae clear without exudates or hemorrhage  Neuro: Detailed Neurologic Exam  Speech:    Speech is normal; fluent and spontaneous with normal comprehension.  Cognition:    The patient is oriented to person, place, and time;     recent and remote memory intact;     language fluent;     normal attention, concentration,     fund of knowledge Cranial Nerves:    The pupils are equal, round, and reactive to light. The fundi are normal and spontaneous  venous pulsations are present. Visual fields are full to finger confrontation. Extraocular movements are intact. Trigeminal sensation is intact and the muscles of mastication are normal. The face is symmetric. The palate elevates in the midline. Hearing intact. Voice is normal. Shoulder shrug is normal. The tongue has normal motion without fasciculations.   Coordination:    Normal finger to nose and heel to shin. Normal rapid alternating movements.   Gait:    Heel-toe and tandem gait are normal.   Motor Observation:    No asymmetry, Atrophy of the bilateral ADMs. , and no involuntary movements noted. Tone:    Normal muscle tone.    Posture:    Posture is normal. normal erect    Strength: Weakness of the bilateral ADMs with atrophy in the muscles.     Strength is V/V in the upper and lower limbs.      Sensation: intact sensation distally in the feet.      Reflex Exam:  DTR's:    Deep tendon reflexes are hyporeflexic in the lowers bilaterally.   Toes:    The toes are downgoing bilaterally.   Clonus:    Clonus is absent.      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 Evaluation of peripheral polyneuropathy causing symptoms before proceeding to left-sided CTS and Ulnar entrapment surgeries. 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 The left median conductions consistent with moderate entrapment likely at the wrist however patient's symptomatic moreso of left ulnar entrapment. Start neurontin.  Discuss conservative measures such as wearin elbow guards at night,not pushing up on elbows to get up out of chairs ,keeping elbow straight.   Patient has neck pain, emg did not show radiculopathy but  will order an mri of the cervical spine due to his neck pain and radicular symptoms to ensure there is no dbl-crush syndrome. Can repeat an emg/ncs including radial sensory and distal leg conductions at a later time but he denies symptoms of polyneuropathy of the feet.   Cc: frank rowan MD  Sarina Ill, MD  [facility] [address] [place], [place] [address]  Phone [phone] Fax [phone]

## 2014-12-30 NOTE — Patient Instructions (Signed)
Remember to drink plenty of fluid, eat healthy meals and do not skip any meals. Try to eat protein with a every meal and eat a healthy snack such as fruit or nuts in between meals. Try to keep a regular sleep-wake schedule and try to exercise daily, particularly in the form of walking, 20-30 minutes a day, if you can.   As far as your medications are concerned, I would like to suggest: Gabapentin 300mg  up to three times a day  As far as diagnostic testing: MRI of the cervical cord  I would like to see you back if needed, sooner if we need to. Please call us with any interim questions, concerns, problems, updates or refill requests.   Our phone number is [phone]. We also have an after hours call service for urgent matters and there is a physician on-call for urgent questions. For any emergencies you know to call 911 or go to the nearest emergency room

## 2015-01-06 DIAGNOSIS — G629 Polyneuropathy, unspecified: Secondary | ICD-10-CM | POA: Insufficient documentation

## 2015-01-06 DIAGNOSIS — G5622 Lesion of ulnar nerve, left upper limb: Secondary | ICD-10-CM | POA: Insufficient documentation

## 2015-01-08 NOTE — Pre-Procedure Instructions (Addendum)
Walter White  01/08/2015      Walter White [address] [place], [place] [address] [place] [address] Phone: [phone] Fax: [phone]    Your procedure is scheduled on Wednesday, January 20, 2015  Report to [facility] Admitting at 6:30 A.M.  Call this number if you have problems the morning of surgery:  [phone]   Remember:  Do not eat food or drink liquids after midnight Tuesday, January 19, 2015  Take these medicines the morning of surgery with A SIP OF WATER : gabapentin (NEURONTIN),  traMADol (ULTRAM), if needed: oxyCODONE-acetaminophen (PERCOCET/ROXICET) for pain  Stop taking Aspirin, Vitamins, fish oil and herbal medications. Do not take any NSAIDs ie: Ibuprofen, Advil, Naproxen or any medication containing Aspirin such as meloxicam (MOBIC) and diclofenac sodium (VOLTAREN); stop Thursday, January 15, 2015.   Do not wear jewelry, make-up or nail polish.  Do not wear lotions, powders, or perfumes.  You may not wear deodorant.  Do not shave 48 hours prior to surgery.  Men may shave face and neck.  Do not bring valuables to the hospital.  [facility] is not responsible for any belongings or valuables.  Contacts, dentures or bridgework may not be worn into surgery.  Leave your suitcase in the car.  After surgery it may be brought to your room.  For patients admitted to the hospital, discharge time will be determined by your treatment team.  Patients discharged the day of surgery will not be allowed to drive home.   Name and phone number of your driver:  Special instructions:  Special Instructions: [place] - Preparing for Surgery  Before surgery, you can play an important role.  Because skin is not sterile, your skin needs to be as free of germs as possible.  You can reduce the number of germs on you skin by washing with CHG (chlorahexidine gluconate) soap before surgery.  CHG is an antiseptic cleaner which kills germs and  bonds with the skin to continue killing germs even after washing.  Please DO NOT use if you have an allergy to CHG or antibacterial soaps.  If your skin becomes reddened/irritated stop using the CHG and inform your nurse when you arrive at Short Stay.  Do not shave (including legs and underarms) for at least 48 hours prior to the first CHG shower.  You may shave your face.  Please follow these instructions carefully:   1.  Shower with CHG Soap the night before surgery and the morning of Surgery.  2.  If you choose to wash your hair, wash your hair first as usual with your normal shampoo.  3.  After you shampoo, rinse your hair and body thoroughly to remove the Shampoo.  4.  Use CHG as you would any other liquid soap.  You can apply chg directly  to the skin and wash gently with scrungie or a clean washcloth.  5.  Apply the CHG Soap to your body ONLY FROM THE NECK DOWN.  Do not use on open wounds or open sores.  Avoid contact with your eyes ears, mouth and genitals (private parts).  Wash genitals (private parts)       with your normal soap.  6.  Wash thoroughly, paying special attention to the area where your surgery will be performed.  7.  Thoroughly rinse your body with warm water from the neck down.  8.  DO NOT shower/wash with your normal soap after using and rinsing  off the CHG Soap.  9.  Pat yourself dry with a clean towel.            10.  Wear clean pajamas.            11.  Place clean sheets on your bed the night of your first shower and do not sleep with pets.  Day of Surgery  Do not apply any lotions/deodorants the morning of surgery.  Please wear clean clothes to the hospital/surgery center.  Please read over the following fact sheets that you were given. Pain Booklet, Coughing and Deep Breathing, Blood Transfusion Information, MRSA Information and Surgical Site Infection Prevention

## 2015-01-10 HISTORY — PX: BACK SURGERY: SHX140

## 2015-01-11 DIAGNOSIS — H11153 Pinguecula, bilateral: Secondary | ICD-10-CM | POA: Diagnosis not present

## 2015-01-11 DIAGNOSIS — H25813 Combined forms of age-related cataract, bilateral: Secondary | ICD-10-CM | POA: Diagnosis not present

## 2015-01-11 DIAGNOSIS — H43393 Other vitreous opacities, bilateral: Secondary | ICD-10-CM | POA: Diagnosis not present

## 2015-01-12 ENCOUNTER — Encounter (HOSPITAL_COMMUNITY): Payer: Self-pay

## 2015-01-12 ENCOUNTER — Encounter (HOSPITAL_COMMUNITY)
Admission: RE | Admit: 2015-01-12 | Discharge: 2015-01-12 | Disposition: A | Discharge status: Home / Self Care | Payer: Medicare Other | Source: Ambulatory Visit | Attending: Orthopedic Surgery | Admitting: Orthopedic Surgery

## 2015-01-12 DIAGNOSIS — M4800 Spinal stenosis, site unspecified: Secondary | ICD-10-CM | POA: Insufficient documentation

## 2015-01-12 DIAGNOSIS — Z0183 Encounter for blood typing: Secondary | ICD-10-CM | POA: Diagnosis not present

## 2015-01-12 DIAGNOSIS — Z01812 Encounter for preprocedural laboratory examination: Secondary | ICD-10-CM | POA: Insufficient documentation

## 2015-01-12 HISTORY — DX: Sleep apnea, unspecified: G47.30

## 2015-01-12 HISTORY — DX: Failed or difficult intubation, initial encounter: T88.4XXA

## 2015-01-12 HISTORY — DX: Essential (primary) hypertension: I10

## 2015-01-12 LAB — COMPREHENSIVE METABOLIC PANEL
ALBUMIN: 3.9 g/dL (ref 3.5–5.0)
ALK PHOS: 87 U/L (ref 38–126)
ALT: 25 U/L (ref 17–63)
AST: 29 U/L (ref 15–41)
Anion gap: 7 (ref 5–15)
BUN: 16 mg/dL (ref 6–20)
CHLORIDE: 104 mmol/L (ref 101–111)
CO2: 27 mmol/L (ref 22–32)
CREATININE: 0.76 mg/dL (ref 0.61–1.24)
Calcium: 9.3 mg/dL (ref 8.9–10.3)
GFR calc non Af Amer: >60 mL/min (ref >60)
GLUCOSE: 170 mg/dL — AB (ref 65–99)
Potassium: 4 mmol/L (ref 3.5–5.1)
SODIUM: 138 mmol/L (ref 135–145)
Total Bilirubin: 0.5 mg/dL (ref 0.3–1.2)
Total Protein: 7.9 g/dL (ref 6.5–8.1)

## 2015-01-12 LAB — CBC WITH DIFFERENTIAL/PLATELET
BASOS ABS: 0 10*3/uL (ref 0.0–0.1)
BASOS PCT: 0 %
EOS ABS: 0.1 10*3/uL (ref 0.0–0.7)
EOS PCT: 4 %
HCT: 37.3 % — ABNORMAL LOW (ref 39.0–52.0)
HEMOGLOBIN: 12.7 g/dL — AB (ref 13.0–17.0)
Lymphocytes Relative: 22 %
Lymphs Abs: 0.7 10*3/uL (ref 0.7–4.0)
MCH: 29.8 pg (ref 26.0–34.0)
MCHC: 34 g/dL (ref 30.0–36.0)
MCV: 87.6 fL (ref 78.0–100.0)
Monocytes Absolute: 0.2 10*3/uL (ref 0.1–1.0)
Monocytes Relative: 5 %
NEUTROS PCT: 69 %
Neutro Abs: 2.2 10*3/uL (ref 1.7–7.7)
PLATELETS: 142 10*3/uL — AB (ref 150–400)
RBC: 4.26 MIL/uL (ref 4.22–5.81)
RDW: 14 % (ref 11.5–15.5)
WBC: 3.1 10*3/uL — AB (ref 4.0–10.5)

## 2015-01-12 LAB — SURGICAL PCR SCREEN
MRSA, PCR: NEGATIVE
Staphylococcus aureus: NEGATIVE

## 2015-01-12 LAB — URINALYSIS, ROUTINE W REFLEX MICROSCOPIC
Bilirubin Urine: NEGATIVE
GLUCOSE, UA: NEGATIVE mg/dL
KETONES UR: NEGATIVE mg/dL
LEUKOCYTES UA: NEGATIVE
Nitrite: NEGATIVE
PH: 5.5 (ref 5.0–8.0)
Protein, ur: NEGATIVE mg/dL
Specific Gravity, Urine: 1.022 (ref 1.005–1.030)

## 2015-01-12 LAB — TYPE AND SCREEN
ABO/RH(D): O POS
ANTIBODY SCREEN: NEGATIVE

## 2015-01-12 LAB — APTT: APTT: 29 s (ref 24–37)

## 2015-01-12 LAB — URINE MICROSCOPIC-ADD ON

## 2015-01-12 LAB — PROTIME-INR
INR: 1 (ref 0.00–1.49)
Prothrombin Time: 13.4 seconds (ref 11.6–15.2)

## 2015-01-12 NOTE — Progress Notes (Signed)
   01/12/15 0828  OBSTRUCTIVE SLEEP APNEA  Have you ever been diagnosed with sleep apnea through a sleep study? No  Do you snore loudly (loud enough to be heard through closed doors)?  0  Do you often feel tired, fatigued, or sleepy during the daytime (such as falling asleep during driving or talking to someone)? 0  Has anyone observed you stop breathing during your sleep? 0  Do you have, or are you being treated for high blood pressure? 1  BMI more than 35 kg/m2? 1  Age > 50 (1-yes) 1  Neck circumference greater than:Male 16 inches or larger, Male 17inches or larger? 1 (70)  Male Gender (Yes=1) 1  Obstructive Sleep Apnea Score 5  Score 5 or greater  Results sent to PCP

## 2015-01-13 ENCOUNTER — Ambulatory Visit
Admission: RE | Admit: 2015-01-13 | Discharge: 2015-01-13 | Disposition: A | Discharge status: Home / Self Care | Payer: Medicare Other | Source: Ambulatory Visit | Attending: Neurology | Admitting: Neurology

## 2015-01-13 ENCOUNTER — Telehealth: Payer: Self-pay | Admitting: *Deleted

## 2015-01-13 ENCOUNTER — Other Ambulatory Visit (HOSPITAL_COMMUNITY): Payer: Medicare Other

## 2015-01-13 DIAGNOSIS — M50223 Other cervical disc displacement at C6-C7 level: Secondary | ICD-10-CM | POA: Diagnosis not present

## 2015-01-13 DIAGNOSIS — R202 Paresthesia of skin: Secondary | ICD-10-CM

## 2015-01-13 DIAGNOSIS — R531 Weakness: Secondary | ICD-10-CM

## 2015-01-13 DIAGNOSIS — M5412 Radiculopathy, cervical region: Secondary | ICD-10-CM

## 2015-01-13 NOTE — Telephone Encounter (Signed)
Called pt and discussed MRI results per Dr Jaynee Eagles note below. Pt verbalized understanding. He is still experiencing numbness/tingling in hands. Would like to know why. He is still taking gabapentin 300mg  3x/day. He said this is helping in his legs. Advised I will let Dr Jaynee Eagles know and we will call him back.

## 2015-01-13 NOTE — Telephone Encounter (Signed)
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 We discussed this at length at the appointment. I sent my note to his physician, He should follow up with the physician who referred him to [place] for evaluation of the left ulnar neuropathy. Thanks.

## 2015-01-13 NOTE — Telephone Encounter (Signed)
-----   Message from Melvenia Beam, MD sent at 01/13/2015  4:50 PM EST ----- Patient's cervical spine looks great. Some minimal disc bulging at one level that is not significant. No pinched nerve, no significant arthritis. Thanks

## 2015-01-14 NOTE — Telephone Encounter (Signed)
Called pt back and relayed Dr Jaynee Eagles message below. Pt verbalized understanding. He will contact Dr Mayer Camel, physician who referred him to [facility].

## 2015-01-19 ENCOUNTER — Telehealth: Payer: Self-pay | Admitting: *Deleted

## 2015-01-19 MED ORDER — VANCOMYCIN HCL 10 G IV SOLR
1500.0000 mg | INTRAVENOUS | Status: AC
  Administered 2015-01-20: 1500 mg via INTRAVENOUS
  Filled 2015-01-19: qty 1500

## 2015-01-19 MED ORDER — POVIDONE-IODINE 7.5 % EX SOLN
Freq: Once | CUTANEOUS | Status: DC
  Filled 2015-01-19: qty 118

## 2015-01-19 NOTE — H&P (Signed)
PREOPERATIVE H&P  Chief Complaint: Bilateral leg pain  HPI: Walter White is a 65 y.o. male who presents with ongoing pain in the bilateral legs  MRI reveals stenosis L3-5, xrays reveal instability at L4/5  Patient has failed multiple forms of conservative care and continues to have pain (see office notes for additional details regarding the patient's full course of treatment)  Past Medical History  Diagnosis Date  . Arthritis     spinal steniosis  . Spinal stenosis   . Seasonal allergies   . Pneumonia     Hx -when very young  . Benign prostate hyperplasia   . Hypertension     "recently put on rx"  . GERD (gastroesophageal reflux disease)   . Sleep apnea     tested pos on screening- faxed to pcp  . 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 At risk for difficult intubation     no trouble intubating as far as pt knows- swelling afterward -above note previous   Past Surgical History  Procedure Laterality Date  . Knee arthroscopy  2010    rt   . Knee arthroscopy      left x 2  . Knee arthroscopy  11/23/2010    Procedure: ARTHROSCOPY KNEE;  Surgeon: Kerin Salen;  Location: [facility];  Service: Orthopedics;  Laterality: Left;  arthroscopy left knee  . Polypectomy  2010  . Wisdom tooth extraction    . Total knee arthroplasty Left 09/21/2014    Procedure: TOTAL KNEE ARTHROPLASTY;  Surgeon: Frederik Pear, MD;  Location: [facility];  Service: Orthopedics;  Laterality: Left;  . Colonoscopy    . Colonoscopy  2010    polyp  . Carpal tunnel release      right   . Elbow surgery      right ulnar release  . Joint replacement    . Shoulder arthroscopy Left     x2   Social History   Social History  . Marital Status: Married    Spouse Name: [name]   . Number of Children: 2  . Years of Education: Some colle   Occupational History  . Retired    Social History  Main Topics  . Smoking status: Former Smoker    Quit date: 11/21/1980  . Smokeless tobacco: Never Used  . Alcohol Use: 0.0 oz/week    0 Standard drinks or equivalent per week     Comment: rare  . Drug Use: No  . Sexual Activity: Not on file   Other Topics Concern  . Not on file   Social History Narrative   Lives with wife   Caffeine use: tea every day   Family History  Problem Relation Age of Onset  . Colon cancer Neg Hx   . Colon polyps Neg Hx   . Esophageal cancer Neg Hx   . Rectal cancer Neg Hx   . Stomach cancer Neg Hx    Allergies  Allergen Reactions  . Penicillins     Per pt: unknown Has patient had a PCN reaction causing immediate rash, facial/tongue/throat swelling, SOB or lightheadedness with hypotension: Unknown Has patient had a PCN reaction causing severe rash involving mucus membranes or skin necrosis: Unknown Has patient had a PCN reaction that required hospitalization: Unknown Has patient had a PCN reaction occurring within the last 10 years: No If all of  the above answers are "NO", then may proceed with Cephalosporin use.    Prior to Admission medications   Medication Sig Start Date End Date Taking? Authorizing Provider  diclofenac sodium (VOLTAREN) 1 % GEL Apply 1 g topically daily as needed (pain).   Yes Historical Provider, MD  gabapentin (NEURONTIN) 300 MG capsule Take 1 capsule (300 mg total) by mouth 3 (three) times daily. 12/30/14  Yes Melvenia Beam, MD  HYDROcodone-acetaminophen (NORCO/VICODIN) 5-325 MG per tablet Take 1 tablet by mouth every evening. Reported on 12/30/2014   Yes Historical Provider, MD  lisinopril (PRINIVIL,ZESTRIL) 10 MG tablet Take 10 mg by mouth daily.   Yes Historical Provider, MD  meloxicam (MOBIC) 15 MG tablet Take 15 mg by mouth every morning.    Yes Historical Provider, MD  methocarbamol (ROBAXIN) 500 MG tablet Take 1 tablet (500 mg total) by mouth 2 (two) times daily with a meal. 09/21/14  Yes Leighton Parody, PA-C    oxyCODONE-acetaminophen (PERCOCET/ROXICET) 5-325 MG tablet Take 1 tablet by mouth every 6 (six) hours as needed for moderate pain.  12/15/14  Yes Historical Provider, MD  Tamsulosin HCl (FLOMAX) 0.4 MG CAPS Take 0.4 mg by mouth every evening.    Yes Historical Provider, MD  traMADol (ULTRAM) 50 MG tablet Take 50 mg by mouth every morning.    Yes Historical Provider, MD  aspirin 81 MG tablet Take 81 mg by mouth daily.    Historical Provider, MD  docusate sodium (COLACE) 50 MG capsule Take 50 mg by mouth daily.    Historical Provider, MD  pyridOXINE (VITAMIN B-6) 50 MG tablet Take 50 mg by mouth daily.    Historical Provider, MD     All other systems have been reviewed and were otherwise negative with the exception of those mentioned in the HPI and as above.  Physical Exam: There were no vitals filed for this visit.  General: Alert, no acute distress Cardiovascular: No pedal edema Respiratory: No cyanosis, no use of accessory musculature Skin: No lesions in the area of chief complaint Neurologic: Sensation intact distally Psychiatric: Patient is competent for consent with normal mood and affect Lymphatic: No axillary or cervical lymphadenopathy   Assessment/Plan: Spinal stenosis Plan for Procedure(s): POSTERIOR LUMBAR FUSION 1 LEVEL, 2 level decompression   Sinclair Ship, MD 01/19/2015 12:44 PM

## 2015-01-19 NOTE — Telephone Encounter (Signed)
CD mail on 01/19/15 to patient.

## 2015-01-20 ENCOUNTER — Encounter (HOSPITAL_COMMUNITY)
Admission: RE | Disposition: A | Discharge status: Home / Self Care | Payer: Medicare Other | Source: Ambulatory Visit | Attending: Orthopedic Surgery

## 2015-01-20 ENCOUNTER — Inpatient Hospital Stay (HOSPITAL_COMMUNITY): Payer: Medicare Other | Admitting: Anesthesiology

## 2015-01-20 ENCOUNTER — Encounter (HOSPITAL_COMMUNITY): Payer: Self-pay | Admitting: *Deleted

## 2015-01-20 ENCOUNTER — Inpatient Hospital Stay (HOSPITAL_COMMUNITY): Payer: Medicare Other

## 2015-01-20 ENCOUNTER — Inpatient Hospital Stay (HOSPITAL_COMMUNITY): Payer: Medicare Other | Admitting: Vascular Surgery

## 2015-01-20 ENCOUNTER — Inpatient Hospital Stay (HOSPITAL_COMMUNITY)
Admission: RE | Admit: 2015-01-20 | Discharge: 2015-01-21 | DRG: 460 | Disposition: A | Discharge status: Home / Self Care | Payer: Medicare Other | Source: Ambulatory Visit | Attending: Orthopedic Surgery | Admitting: Orthopedic Surgery

## 2015-01-20 DIAGNOSIS — M549 Dorsalgia, unspecified: Secondary | ICD-10-CM | POA: Diagnosis not present

## 2015-01-20 DIAGNOSIS — G4730 Sleep apnea, unspecified: Secondary | ICD-10-CM | POA: Diagnosis present

## 2015-01-20 DIAGNOSIS — I10 Essential (primary) hypertension: Secondary | ICD-10-CM | POA: Diagnosis present

## 2015-01-20 DIAGNOSIS — Z981 Arthrodesis status: Secondary | ICD-10-CM | POA: Diagnosis not present

## 2015-01-20 DIAGNOSIS — G9519 Other vascular myelopathies: Secondary | ICD-10-CM | POA: Diagnosis present

## 2015-01-20 DIAGNOSIS — M4316 Spondylolisthesis, lumbar region: Secondary | ICD-10-CM | POA: Diagnosis present

## 2015-01-20 DIAGNOSIS — Z419 Encounter for procedure for purposes other than remedying health state, unspecified: Secondary | ICD-10-CM

## 2015-01-20 DIAGNOSIS — K219 Gastro-esophageal reflux disease without esophagitis: Secondary | ICD-10-CM | POA: Diagnosis present

## 2015-01-20 DIAGNOSIS — M1990 Unspecified osteoarthritis, unspecified site: Secondary | ICD-10-CM | POA: Diagnosis not present

## 2015-01-20 DIAGNOSIS — M4326 Fusion of spine, lumbar region: Secondary | ICD-10-CM | POA: Diagnosis not present

## 2015-01-20 DIAGNOSIS — M4806 Spinal stenosis, lumbar region: Principal | ICD-10-CM | POA: Diagnosis present

## 2015-01-20 DIAGNOSIS — M48062 Spinal stenosis, lumbar region with neurogenic claudication: Secondary | ICD-10-CM | POA: Diagnosis present

## 2015-01-20 SURGERY — POSTERIOR LUMBAR FUSION 1 LEVEL
Anesthesia: General | Laterality: Right

## 2015-01-20 MED ORDER — ROCURONIUM BROMIDE 50 MG/5ML IV SOLN
INTRAVENOUS | Status: AC
  Filled 2015-01-20: qty 2

## 2015-01-20 MED ORDER — GABAPENTIN 300 MG PO CAPS
300.0000 mg | ORAL_CAPSULE | Freq: Three times a day (TID) | ORAL | Status: DC
  Administered 2015-01-20 – 2015-01-21 (×3): 300 mg via ORAL
  Filled 2015-01-20 (×3): qty 1

## 2015-01-20 MED ORDER — ACETAMINOPHEN 650 MG RE SUPP: 650.0000 mg | RECTAL | Status: DC | PRN

## 2015-01-20 MED ORDER — ONDANSETRON HCL 4 MG/2ML IJ SOLN
4.0000 mg | INTRAMUSCULAR | Status: DC | PRN
Start: 2015-01-20 — End: 2015-01-21
  Administered 2015-01-20: 4 mg via INTRAVENOUS
  Filled 2015-01-20: qty 2

## 2015-01-20 MED ORDER — ONDANSETRON HCL 4 MG/2ML IJ SOLN
INTRAMUSCULAR | Status: AC
  Filled 2015-01-20: qty 2

## 2015-01-20 MED ORDER — LACTATED RINGERS IV SOLN
INTRAVENOUS | Status: DC | PRN
  Administered 2015-01-20 (×3): via INTRAVENOUS

## 2015-01-20 MED ORDER — METHYLENE BLUE 1 % INJ SOLN
INTRAMUSCULAR | Status: AC
  Filled 2015-01-20: qty 10

## 2015-01-20 MED ORDER — OXYCODONE-ACETAMINOPHEN 5-325 MG PO TABS
1.0000 | ORAL_TABLET | ORAL | Status: DC | PRN
  Administered 2015-01-21 (×2): 2 via ORAL
  Filled 2015-01-20 (×2): qty 2

## 2015-01-20 MED ORDER — ALBUMIN HUMAN 5 % IV SOLN
INTRAVENOUS | Status: DC | PRN
  Administered 2015-01-20 (×2): via INTRAVENOUS

## 2015-01-20 MED ORDER — EPHEDRINE SULFATE 50 MG/ML IJ SOLN
INTRAMUSCULAR | Status: AC
  Filled 2015-01-20: qty 1

## 2015-01-20 MED ORDER — 0.9 % SODIUM CHLORIDE (POUR BTL) OPTIME
TOPICAL | Status: DC | PRN
  Administered 2015-01-20 (×4): 1000 mL

## 2015-01-20 MED ORDER — ZOLPIDEM TARTRATE 5 MG PO TABS: 5.0000 mg | ORAL_TABLET | Freq: Every evening | ORAL | Status: DC | PRN

## 2015-01-20 MED ORDER — ARTIFICIAL TEARS OP OINT
TOPICAL_OINTMENT | OPHTHALMIC | Status: AC
  Filled 2015-01-20: qty 3.5

## 2015-01-20 MED ORDER — FLEET ENEMA 7-19 GM/118ML RE ENEM: 1.0000 | ENEMA | Freq: Once | RECTAL | Status: DC | PRN

## 2015-01-20 MED ORDER — LISINOPRIL 20 MG PO TABS
10.0000 mg | ORAL_TABLET | Freq: Every day | ORAL | Status: DC
  Administered 2015-01-20 – 2015-01-21 (×2): 10 mg via ORAL
  Filled 2015-01-20 (×2): qty 1

## 2015-01-20 MED ORDER — THROMBIN 20000 UNITS EX SOLR
CUTANEOUS | Status: AC
  Filled 2015-01-20: qty 20000

## 2015-01-20 MED ORDER — MORPHINE SULFATE (PF) 2 MG/ML IV SOLN
1.0000 mg | INTRAVENOUS | Status: DC | PRN
  Administered 2015-01-20: 4 mg via INTRAVENOUS
  Administered 2015-01-20 – 2015-01-21 (×3): 2 mg via INTRAVENOUS
  Filled 2015-01-20 (×3): qty 1
  Filled 2015-01-20: qty 2

## 2015-01-20 MED ORDER — ALUM & MAG HYDROXIDE-SIMETH 200-200-20 MG/5ML PO SUSP: 30.0000 mL | Freq: Four times a day (QID) | ORAL | Status: DC | PRN

## 2015-01-20 MED ORDER — ROCURONIUM BROMIDE 100 MG/10ML IV SOLN
INTRAVENOUS | Status: DC | PRN
  Administered 2015-01-20 (×2): 10 mg via INTRAVENOUS
  Administered 2015-01-20: 50 mg via INTRAVENOUS

## 2015-01-20 MED ORDER — LIDOCAINE HCL (CARDIAC) 20 MG/ML IV SOLN
INTRAVENOUS | Status: DC | PRN
  Administered 2015-01-20: 100 mg via INTRAVENOUS

## 2015-01-20 MED ORDER — HYDROMORPHONE HCL 1 MG/ML IJ SOLN
0.2500 mg | INTRAMUSCULAR | Status: DC | PRN
  Administered 2015-01-20 (×2): 0.5 mg via INTRAVENOUS

## 2015-01-20 MED ORDER — THROMBIN 20000 UNITS EX SOLR
CUTANEOUS | Status: DC | PRN
  Administered 2015-01-20: 10:00:00 via TOPICAL

## 2015-01-20 MED ORDER — MIDAZOLAM HCL 5 MG/5ML IJ SOLN
INTRAMUSCULAR | Status: DC | PRN
  Administered 2015-01-20: 2 mg via INTRAVENOUS

## 2015-01-20 MED ORDER — SENNOSIDES-DOCUSATE SODIUM 8.6-50 MG PO TABS: 1.0000 | ORAL_TABLET | Freq: Every evening | ORAL | Status: DC | PRN

## 2015-01-20 MED ORDER — MIDAZOLAM HCL 2 MG/2ML IJ SOLN
INTRAMUSCULAR | Status: AC
  Filled 2015-01-20: qty 2

## 2015-01-20 MED ORDER — BUPIVACAINE-EPINEPHRINE 0.25% -1:200000 IJ SOLN
INTRAMUSCULAR | Status: DC | PRN
  Administered 2015-01-20: 6 mL

## 2015-01-20 MED ORDER — ONDANSETRON HCL 4 MG/2ML IJ SOLN
INTRAMUSCULAR | Status: AC
  Filled 2015-01-20: qty 4

## 2015-01-20 MED ORDER — SODIUM CHLORIDE 0.9 % IJ SOLN
INTRAMUSCULAR | Status: AC
  Filled 2015-01-20: qty 10

## 2015-01-20 MED ORDER — SODIUM CHLORIDE 0.9 % IV SOLN
INTRAVENOUS | Status: DC | PRN
  Administered 2015-01-20: 08:00:00 via INTRAVENOUS

## 2015-01-20 MED ORDER — BISACODYL 5 MG PO TBEC: 5.0000 mg | DELAYED_RELEASE_TABLET | Freq: Every day | ORAL | Status: DC | PRN

## 2015-01-20 MED ORDER — ONDANSETRON HCL 4 MG/2ML IJ SOLN
INTRAMUSCULAR | Status: DC | PRN
  Administered 2015-01-20 (×2): 4 mg via INTRAVENOUS

## 2015-01-20 MED ORDER — EPHEDRINE SULFATE 50 MG/ML IJ SOLN
INTRAMUSCULAR | Status: DC | PRN
  Administered 2015-01-20 (×2): 15 mg via INTRAVENOUS

## 2015-01-20 MED ORDER — DOCUSATE SODIUM 50 MG PO CAPS: 50.0000 mg | ORAL_CAPSULE | Freq: Every day | ORAL | Status: DC

## 2015-01-20 MED ORDER — LIDOCAINE HCL (CARDIAC) 20 MG/ML IV SOLN
INTRAVENOUS | Status: AC
  Filled 2015-01-20: qty 5

## 2015-01-20 MED ORDER — SODIUM CHLORIDE 0.9 % IJ SOLN: 3.0000 mL | INTRAMUSCULAR | Status: DC | PRN

## 2015-01-20 MED ORDER — SODIUM CHLORIDE 0.9 % IV SOLN: 250.0000 mL | INTRAVENOUS | Status: DC

## 2015-01-20 MED ORDER — ACETAMINOPHEN 325 MG PO TABS: 650.0000 mg | ORAL_TABLET | ORAL | Status: DC | PRN

## 2015-01-20 MED ORDER — METHYLENE BLUE 1 % INJ SOLN
INTRAMUSCULAR | Status: DC | PRN
  Administered 2015-01-20: .2 mL via SUBMUCOSAL

## 2015-01-20 MED ORDER — TAMSULOSIN HCL 0.4 MG PO CAPS
0.4000 mg | ORAL_CAPSULE | Freq: Every evening | ORAL | Status: DC
  Administered 2015-01-20: 0.4 mg via ORAL
  Filled 2015-01-20: qty 1

## 2015-01-20 MED ORDER — BUPIVACAINE-EPINEPHRINE (PF) 0.25% -1:200000 IJ SOLN
INTRAMUSCULAR | Status: AC
  Filled 2015-01-20: qty 30

## 2015-01-20 MED ORDER — VITAMIN B-6 50 MG PO TABS
50.0000 mg | ORAL_TABLET | Freq: Every day | ORAL | Status: DC
  Administered 2015-01-21: 50 mg via ORAL
  Filled 2015-01-20: qty 1

## 2015-01-20 MED ORDER — PROPOFOL 500 MG/50ML IV EMUL
INTRAVENOUS | Status: DC | PRN
  Administered 2015-01-20: 50 ug/kg/min via INTRAVENOUS

## 2015-01-20 MED ORDER — PROPOFOL 10 MG/ML IV BOLUS
INTRAVENOUS | Status: DC | PRN
  Administered 2015-01-20: 200 mg via INTRAVENOUS

## 2015-01-20 MED ORDER — MENTHOL 3 MG MT LOZG: 1.0000 | LOZENGE | OROMUCOSAL | Status: DC | PRN

## 2015-01-20 MED ORDER — PHENOL 1.4 % MT LIQD: 1.0000 | OROMUCOSAL | Status: DC | PRN

## 2015-01-20 MED ORDER — STERILE WATER FOR INJECTION IJ SOLN
INTRAMUSCULAR | Status: AC
Start: 2015-01-20 — End: 2015-01-20
  Filled 2015-01-20: qty 10

## 2015-01-20 MED ORDER — FENTANYL CITRATE (PF) 250 MCG/5ML IJ SOLN
INTRAMUSCULAR | Status: AC
  Filled 2015-01-20: qty 5

## 2015-01-20 MED ORDER — GLYCOPYRROLATE 0.2 MG/ML IJ SOLN
INTRAMUSCULAR | Status: DC | PRN
  Administered 2015-01-20: 0.3 mg via INTRAVENOUS

## 2015-01-20 MED ORDER — PROMETHAZINE HCL 25 MG/ML IJ SOLN: 6.2500 mg | INTRAMUSCULAR | Status: DC | PRN

## 2015-01-20 MED ORDER — ARTIFICIAL TEARS OP OINT
TOPICAL_OINTMENT | OPHTHALMIC | Status: DC | PRN
  Administered 2015-01-20: 1 via OPHTHALMIC

## 2015-01-20 MED ORDER — GLYCOPYRROLATE 0.2 MG/ML IJ SOLN
INTRAMUSCULAR | Status: AC
  Filled 2015-01-20: qty 3

## 2015-01-20 MED ORDER — THROMBIN 20000 UNITS EX KIT: PACK | CUTANEOUS | Status: DC | PRN

## 2015-01-20 MED ORDER — DOCUSATE SODIUM 100 MG PO CAPS
100.0000 mg | ORAL_CAPSULE | Freq: Two times a day (BID) | ORAL | Status: DC
  Administered 2015-01-20 – 2015-01-21 (×2): 100 mg via ORAL
  Filled 2015-01-20 (×2): qty 1

## 2015-01-20 MED ORDER — SODIUM CHLORIDE 0.9 % IJ SOLN
3.0000 mL | Freq: Two times a day (BID) | INTRAMUSCULAR | Status: DC
  Administered 2015-01-21: 3 mL via INTRAVENOUS

## 2015-01-20 MED ORDER — PHENYLEPHRINE 40 MCG/ML (10ML) SYRINGE FOR IV PUSH (FOR BLOOD PRESSURE SUPPORT)
PREFILLED_SYRINGE | INTRAVENOUS | Status: AC
  Filled 2015-01-20: qty 10

## 2015-01-20 MED ORDER — NEOSTIGMINE METHYLSULFATE 10 MG/10ML IV SOLN
INTRAVENOUS | Status: DC | PRN
  Administered 2015-01-20: 2.5 mg via INTRAVENOUS

## 2015-01-20 MED ORDER — DIAZEPAM 5 MG PO TABS
5.0000 mg | ORAL_TABLET | Freq: Four times a day (QID) | ORAL | Status: DC | PRN
  Administered 2015-01-20: 5 mg via ORAL
  Filled 2015-01-20: qty 1

## 2015-01-20 MED ORDER — VANCOMYCIN HCL IN DEXTROSE 1-5 GM/200ML-% IV SOLN
1000.0000 mg | Freq: Two times a day (BID) | INTRAVENOUS | Status: DC
  Administered 2015-01-20 – 2015-01-21 (×2): 1000 mg via INTRAVENOUS
  Filled 2015-01-20 (×5): qty 200

## 2015-01-20 MED ORDER — SODIUM CHLORIDE 0.9 % IV SOLN
10.0000 mg | INTRAVENOUS | Status: DC | PRN
  Administered 2015-01-20: 25 ug/min via INTRAVENOUS

## 2015-01-20 MED ORDER — FENTANYL CITRATE (PF) 250 MCG/5ML IJ SOLN
INTRAMUSCULAR | Status: AC
  Filled 2015-01-20: qty 10

## 2015-01-20 MED ORDER — SODIUM CHLORIDE 0.9 % IV SOLN: INTRAVENOUS | Status: DC

## 2015-01-20 MED ORDER — HYDROMORPHONE HCL 1 MG/ML IJ SOLN
INTRAMUSCULAR | Status: AC
  Filled 2015-01-20: qty 1

## 2015-01-20 MED ORDER — FENTANYL CITRATE (PF) 100 MCG/2ML IJ SOLN
INTRAMUSCULAR | Status: DC | PRN
  Administered 2015-01-20: 100 ug via INTRAVENOUS
  Administered 2015-01-20 (×2): 50 ug via INTRAVENOUS
  Administered 2015-01-20: 100 ug via INTRAVENOUS
  Administered 2015-01-20: 50 ug via INTRAVENOUS
  Administered 2015-01-20 (×2): 100 ug via INTRAVENOUS
  Administered 2015-01-20 (×4): 50 ug via INTRAVENOUS

## 2015-01-20 SURGICAL SUPPLY — 90 items
BENZOIN TINCTURE PRP APPL 2/3 (GAUZE/BANDAGES/DRESSINGS) ×3 IMPLANT
BLADE SURG ROTATE 9660 (MISCELLANEOUS) IMPLANT
BUR PRESCISION 1.7 ELITE (BURR) IMPLANT
BUR ROUND PRECISION 4.0 (BURR) ×2 IMPLANT
BUR ROUND PRECISION 4.0MM (BURR) ×1
BUR SABER RD CUTTING 3.0 (BURR) ×2 IMPLANT
BUR SABER RD CUTTING 3.0MM (BURR) ×1
CAGE CONCORDE BULLET 11X13X27 (Cage) ×3 IMPLANT
CARTRIDGE OIL MAESTRO DRILL (MISCELLANEOUS) ×1 IMPLANT
CLOSURE WOUND 1/2 X4 (GAUZE/BANDAGES/DRESSINGS) ×1
CONT SPEC STER OR (MISCELLANEOUS) ×3 IMPLANT
COVER MAYO STAND STRL (DRAPES) ×6 IMPLANT
COVER SURGICAL LIGHT HANDLE (MISCELLANEOUS) ×3 IMPLANT
DIFFUSER DRILL AIR PNEUMATIC (MISCELLANEOUS) ×3 IMPLANT
DRAIN CHANNEL 15F RND FF W/TCR (WOUND CARE) ×3 IMPLANT
DRAPE C-ARM 42X72 X-RAY (DRAPES) ×3 IMPLANT
DRAPE C-ARMOR (DRAPES) IMPLANT
DRAPE INCISE IOBAN 66X45 STRL (DRAPES) ×6 IMPLANT
DRAPE POUCH INSTRU U-SHP 10X18 (DRAPES) ×3 IMPLANT
DRAPE SURG 17X23 STRL (DRAPES) ×12 IMPLANT
DURAPREP 26ML APPLICATOR (WOUND CARE) ×3 IMPLANT
ELECT BLADE 4.0 EZ CLEAN MEGAD (MISCELLANEOUS) ×3
ELECT CAUTERY BLADE 6.4 (BLADE) ×3 IMPLANT
ELECT REM PT RETURN 9FT ADLT (ELECTROSURGICAL) ×3
ELECTRODE BLDE 4.0 EZ CLN MEGD (MISCELLANEOUS) ×1 IMPLANT
ELECTRODE REM PT RTRN 9FT ADLT (ELECTROSURGICAL) ×1 IMPLANT
EVACUATOR SILICONE 100CC (DRAIN) ×3 IMPLANT
FEE INTRAOP MONITOR IMPULS NCS (MISCELLANEOUS) ×1 IMPLANT
GAUZE SPONGE 4X4 12PLY STRL (GAUZE/BANDAGES/DRESSINGS) ×6 IMPLANT
GAUZE SPONGE 4X4 16PLY XRAY LF (GAUZE/BANDAGES/DRESSINGS) ×12 IMPLANT
GLOVE BIO SURGEON STRL SZ7 (GLOVE) ×3 IMPLANT
GLOVE BIO SURGEON STRL SZ8 (GLOVE) ×3 IMPLANT
GLOVE BIOGEL PI IND STRL 6.5 (GLOVE) ×3 IMPLANT
GLOVE BIOGEL PI IND STRL 7.0 (GLOVE) ×2 IMPLANT
GLOVE BIOGEL PI IND STRL 8 (GLOVE) ×1 IMPLANT
GLOVE BIOGEL PI INDICATOR 6.5 (GLOVE) ×6
GLOVE BIOGEL PI INDICATOR 7.0 (GLOVE) ×4
GLOVE BIOGEL PI INDICATOR 8 (GLOVE) ×2
GLOVE SURG SS PI 7.0 STRL IVOR (GLOVE) ×9 IMPLANT
GOWN STRL REUS W/ TWL LRG LVL3 (GOWN DISPOSABLE) ×3 IMPLANT
GOWN STRL REUS W/ TWL XL LVL3 (GOWN DISPOSABLE) ×1 IMPLANT
GOWN STRL REUS W/TWL LRG LVL3 (GOWN DISPOSABLE) ×6
GOWN STRL REUS W/TWL XL LVL3 (GOWN DISPOSABLE) ×2
INTRAOP MONITOR FEE IMPULS NCS (MISCELLANEOUS) ×1
INTRAOP MONITOR FEE IMPULSE (MISCELLANEOUS) ×2
IV CATH 14GX2 1/4 (CATHETERS) ×3 IMPLANT
KIT BASIN OR (CUSTOM PROCEDURE TRAY) ×3 IMPLANT
KIT POSITION SURG JACKSON T1 (MISCELLANEOUS) ×3 IMPLANT
KIT ROOM TURNOVER OR (KITS) ×3 IMPLANT
MARKER SKIN DUAL TIP RULER LAB (MISCELLANEOUS) ×3 IMPLANT
MIX DBX 10CC 35% BONE (Bone Implant) ×3 IMPLANT
NDL SAFETY ECLIPSE 18X1.5 (NEEDLE) ×1 IMPLANT
NEEDLE 22X1 1/2 (OR ONLY) (NEEDLE) ×3 IMPLANT
NEEDLE HYPO 18GX1.5 SHARP (NEEDLE) ×2
NEEDLE HYPO 25GX1X1/2 BEV (NEEDLE) ×3 IMPLANT
NEEDLE SPNL 18GX3.5 QUINCKE PK (NEEDLE) ×6 IMPLANT
NS IRRIG 1000ML POUR BTL (IV SOLUTION) ×3 IMPLANT
OIL CARTRIDGE MAESTRO DRILL (MISCELLANEOUS) ×3
PACK LAMINECTOMY ORTHO (CUSTOM PROCEDURE TRAY) ×3 IMPLANT
PACK UNIVERSAL I (CUSTOM PROCEDURE TRAY) ×3 IMPLANT
PAD ARMBOARD 7.5X6 YLW CONV (MISCELLANEOUS) ×6 IMPLANT
PATTIES SURGICAL .5 X1 (DISPOSABLE) ×3 IMPLANT
PATTIES SURGICAL .5X1.5 (GAUZE/BANDAGES/DRESSINGS) ×3 IMPLANT
PROBE PEDCLE PROBE MAGSTM DISP (MISCELLANEOUS) ×3 IMPLANT
ROD PRE BENT EXPEDIUM 35MM (Rod) ×6 IMPLANT
SCREW SET SINGLE INNER (Screw) ×12 IMPLANT
SCREW VIPER CORT FIX 6X35 (Screw) ×6 IMPLANT
SCREW VIPER CORTICAL FIX 6X40 (Screw) ×6 IMPLANT
SPONGE INTESTINAL PEANUT (DISPOSABLE) ×6 IMPLANT
SPONGE LAP 4X18 X RAY DECT (DISPOSABLE) ×6 IMPLANT
SPONGE SURGIFOAM ABS GEL 100 (HEMOSTASIS) ×3 IMPLANT
STRIP CLOSURE SKIN 1/2X4 (GAUZE/BANDAGES/DRESSINGS) ×2 IMPLANT
SURGIFLO W/THROMBIN 8M KIT (HEMOSTASIS) ×3 IMPLANT
SUT ETHILON 2 0 FS 18 (SUTURE) ×3 IMPLANT
SUT MNCRL AB 4-0 PS2 18 (SUTURE) ×6 IMPLANT
SUT VIC AB 0 CT1 18XCR BRD 8 (SUTURE) ×1 IMPLANT
SUT VIC AB 0 CT1 8-18 (SUTURE) ×2
SUT VIC AB 1 CT1 18XCR BRD 8 (SUTURE) ×2 IMPLANT
SUT VIC AB 1 CT1 8-18 (SUTURE) ×4
SUT VIC AB 2-0 CT2 18 VCP726D (SUTURE) ×3 IMPLANT
SYR 20CC LL (SYRINGE) ×3 IMPLANT
SYR BULB IRRIGATION 50ML (SYRINGE) ×3 IMPLANT
SYR CONTROL 10ML LL (SYRINGE) ×6 IMPLANT
SYR TB 1ML LUER SLIP (SYRINGE) ×3 IMPLANT
TAPE CLOTH SURG 4X10 WHT LF (GAUZE/BANDAGES/DRESSINGS) ×3 IMPLANT
TOWEL OR 17X24 6PK STRL BLUE (TOWEL DISPOSABLE) ×3 IMPLANT
TOWEL OR 17X26 10 PK STRL BLUE (TOWEL DISPOSABLE) ×3 IMPLANT
TRAY FOLEY CATH 16FRSI W/METER (SET/KITS/TRAYS/PACK) ×3 IMPLANT
WATER STERILE IRR 1000ML POUR (IV SOLUTION) ×3 IMPLANT
YANKAUER SUCT BULB TIP NO VENT (SUCTIONS) ×3 IMPLANT

## 2015-01-20 NOTE — Op Note (Signed)
NAME:  Walter White, Walter White NO.:  [id number]  MEDICAL RECORD NO.:  [id number]  LOCATION:  3C05C                        FACILITY:  [facility]  PHYSICIAN:  Phylliss Bob, MD      DATE OF BIRTH:  [birth year]  DATE OF PROCEDURE:  01/20/2015                              OPERATIVE REPORT   PREOPERATIVE DIAGNOSES: 1. Severe spinal stenosis, L3-4, L4-5. 2. Grade 1 L4-5 spondylolisthesis. 3. Neurogenic claudication.  POSTOPERATIVE DIAGNOSES: 1. Severe spinal stenosis, L3-4, L4-5. 2. Grade 1 L4-5 spondylolisthesis. 3. Neurogenic claudication.  PROCEDURE: 1. Lumbar decompression with laminectomy and bilateral partial     facetectomy L3-4, L4-5. 2. Right-sided L4-5 transforaminal lumbar interbody fusion. 3. Left-sided L4-5 posterolateral fusion. 4. Insertion of interbody device x1 (13 x 27 mm Concorde bullet cage). 5. Placement of posterior instrumentation (6 mm screws of the     appropriate length). 6. Use of morselized allograft-DBX mix. 7. Use of local autograft. 8. Intraoperative use of fluoroscopy.  SURGEON:  Phylliss Bob, MD  ASSISTANT:  Pricilla Holm, PA-C  ANESTHESIA:  General endotracheal anesthesia.  COMPLICATIONS:  None.  DISPOSITION:  Stable.  ESTIMATED BLOOD LOSS:  200 mL.  INDICATIONS FOR SURGERY:  Briefly, Mr. Stablein is a very pleasant 65 year old male, who did present to me with ongoing and rather debilitating pain in his bilateral legs.  His symptoms were very consistent with neurogenic claudication.  The patient's MRI and x-ray findings were as reflected above.  The patient did fail multiple forms of appropriate conservative care, and did continue to have ongoing and rather debilitating pain.  Therefore, we did discuss proceeding with the procedure noted above.  The patient was fully aware of the risks and limitations of the procedure, and did elect to proceed.  OPERATIVE DETAILS:  On January 20, 2015, patient was brought to surgery and general  endotracheal anesthesia was administered.  The patient was placed prone on a well-padded flat Jackson bed with a spinal frame. Antibiotics were given.  A time-out procedure was performed.  The back was prepped and draped.  A midline incision was then made overlying the L3-L5 region.  The fascia was incised at the midline.  The paraspinal musculature was bluntly swept laterally.  The lamina from L3-L5 were subperiosteally exposed.  Using anatomic landmarks, I did cannulate the L4 and L5 pedicles using a medial to lateral cortical trajectory technique.  On the patient's left side, I did decorticate the L4-5 facet joint using a high-speed bur.  I then placed a 6 x 40 mm screw on the left at L5 and a 6 x 35 mm screw on the left at L4.  Distraction was applied across a rod and caps were placed and provisionally tightened. On the right side, after cannulating the pedicles, bone wax was placed in the cannulated pedicle holes.  I then removed the spinous processes of L3 and L4.  I then proceeded with a laminectomy and bilateral partial facetectomy and bilateral foraminotomy at the L3-4 and L4-5 levels in the standard fashion.  In this manner, I was able to adequately decompress the spinal canal.  I did evaluate the L3-4 intervertebral space in addition to the L4-5 intervertebral space and  I did feel that the spinal canal was adequately decompressed at these levels.  I then proceeded with the fusion portion of the procedure.  On the right side, a full facetectomy was performed and with an assistant holding medial retraction of the traversing right L5 nerve, I did use a 15-blade knife to perform an annulotomy at the posterolateral aspect of the intervertebral space.  I then used a series of curettes and pituitary rongeurs to perform a thorough and complete L4-5 intervertebral diskectomy.  I was very pleased with the diskectomy that I was able to accomplish.  The endplates were then prepared.  The  intervertebral space was then liberally packed with allograft and autograft and the appropriate size interbody implant was tamped into position in the usual fashion.  I was very pleased with the Press-Fit.  I then placed screws of the same size on the right side at the L4-L5 levels.  Distraction was then discontinued on the patient's left side.  A rod was placed on the right and caps were placed and all 4 caps were provisionally tightened and a final locking procedure was performed.  I then explored the wound for any undue bleeding, and there was no substantial bleeding encountered.  There was no extravasation of cerebrospinal fluid noted throughout the entire surgery.  I was very pleased with the final AP and lateral fluoroscopic images.  I then placed a #15 deep Blake drain, deep to the fascia.  The wound was then closed in layers using #1 Vicryl followed by 2-0 Vicryl, followed by 3-0 Monocryl.  Benzoin and Steri- Strips were applied followed by sterile dressing.  All instrument counts were correct at the termination of the procedure.  Of note, I did liberally irrigate the wound throughout the surgery with a total of approximately 3 L of normal saline.  Of note, I did use neurologic monitoring throughout the surgery, and there was no abnormal EMG activity noted throughout the entire surgery.  Of note, Pricilla Holm was my assistant throughout surgery, and did aid in retraction, suctioning, and closure throughout the surgery.     Phylliss Bob, MD     MD/MEDQ  D:  01/20/2015  T:  01/20/2015  Job:  [id number]  cc:   Thressa Sheller, M.D.

## 2015-01-20 NOTE — Anesthesia Postprocedure Evaluation (Signed)
Anesthesia Post Note  Patient: Walter White  Procedure(s) Performed: Procedure(s) (LRB): POSTERIOR LUMBAR FUSION 1 LEVEL (Right)  Patient location during evaluation: PACU Anesthesia Type: General Level of consciousness: awake and alert Pain management: pain level controlled Vital Signs Assessment: post-procedure vital signs reviewed and stable Respiratory status: spontaneous breathing, nonlabored ventilation, respiratory function stable and patient connected to nasal cannula oxygen Cardiovascular status: blood pressure returned to baseline and stable Postop Assessment: no signs of nausea or vomiting Anesthetic complications: no    Last Vitals:  Filed Vitals:   01/20/15 1615 01/20/15 1630  BP: 131/84 133/76  Pulse: 65 86  Temp:    Resp: 17 12    Last Pain:  Filed Vitals:   01/20/15 1632  PainSc: 3                  Tomeika Weinmann,JAMES TERRILL

## 2015-01-20 NOTE — Anesthesia Preprocedure Evaluation (Addendum)
Anesthesia Evaluation  Patient identified by MRN, date of birth, ID band Patient awake    Reviewed: Allergy & Precautions, NPO status , Patient's Chart, lab work & pertinent test results  History of Anesthesia Complications Negative for: history of anesthetic complications (Patient is actually a grade 1 view with direct laryngoscopy)  Airway Mallampati: I  TM Distance: >3 FB Neck ROM: Full    Dental  (+) Missing   Pulmonary sleep apnea , former smoker,    Pulmonary exam normal breath sounds clear to auscultation       Cardiovascular hypertension,  Rhythm:Regular Rate:Normal     Neuro/Psych    GI/Hepatic GERD  ,  Endo/Other  Morbid obesity  Renal/GU      Musculoskeletal  (+) Arthritis ,   Abdominal (+) + obese,   Peds  Hematology   Anesthesia Other Findings   Reproductive/Obstetrics                            Anesthesia Physical Anesthesia Plan  ASA: III  Anesthesia Plan: General   Post-op Pain Management:    Induction: Intravenous  Airway Management Planned: Oral ETT  Additional Equipment:   Intra-op Plan:   Post-operative Plan: Extubation in OR  Informed Consent: I have reviewed the patients History and Physical, chart, labs and discussed the procedure including the risks, benefits and alternatives for the proposed anesthesia with the patient or authorized representative who has indicated his/her understanding and acceptance.   Dental advisory given  Plan Discussed with: CRNA and Anesthesiologist  Anesthesia Plan Comments: (Size 7.5 ETT)       Anesthesia Quick Evaluation

## 2015-01-20 NOTE — Progress Notes (Signed)
Sign out request made to dr massagee 

## 2015-01-20 NOTE — Transfer of Care (Signed)
Immediate Anesthesia Transfer of Care Note  Patient: Walter White  Procedure(s) Performed: Procedure(s) with comments: POSTERIOR LUMBAR FUSION 1 LEVEL (Right) - Right sided lumbar 4-5 Transforaminal lumbar interbody fusion with instrumentation, allograft and lumbar 3-4, lumbar 4-5 decompression  Patient Location: PACU  Anesthesia Type:General  Level of Consciousness: awake, oriented, sedated, patient cooperative and responds to stimulation  Airway & Oxygen Therapy: Patient Spontanous Breathing and Patient connected to face mask oxygen  Post-op Assessment: Report given to RN, Post -op Vital signs reviewed and stable, Patient moving all extremities and Patient moving all extremities X 4  Post vital signs: Reviewed and stable  Last Vitals:  Filed Vitals:   01/20/15 0716  BP: 153/107  Pulse: 80  Temp: 36.8 C  Resp: 20    Complications: No apparent anesthesia complications

## 2015-01-20 NOTE — Anesthesia Procedure Notes (Addendum)
Procedure Name: Intubation Date/Time: 01/20/2015 8:39 AM Performed by: Jacquiline Doe A Pre-anesthesia Checklist: Patient identified, Emergency Drugs available, Suction available, Patient being monitored and Timeout performed Patient Re-evaluated:Patient Re-evaluated prior to inductionOxygen Delivery Method: Circle system utilized Preoxygenation: Pre-oxygenation with 100% oxygen Intubation Type: IV induction and Cricoid Pressure applied Ventilation: Mask ventilation without difficulty and Oral airway inserted - appropriate to patient size Laryngoscope Size: Mac and 4 Grade View: Grade I Tube type: Oral Tube size: 8.0 mm Number of attempts: 1 Airway Equipment and Method: Rigid stylet Placement Confirmation: ETT inserted through vocal cords under direct vision,  positive ETCO2 and breath sounds checked- equal and bilateral Secured at: 23 cm Tube secured with: Tape Dental Injury: Teeth and Oropharynx as per pre-operative assessment

## 2015-01-20 NOTE — Progress Notes (Signed)
Utilization review completed.  

## 2015-01-20 NOTE — Progress Notes (Signed)
ANTIBIOTIC CONSULT NOTE - INITIAL  Pharmacy Consult for Vancomycin Indication: S/p posterior lumbar fusion with JP drain in place   Allergies  Allergen Reactions  . Penicillins     Per pt: unknown Has patient had a PCN reaction causing immediate rash, facial/tongue/throat swelling, SOB or lightheadedness with hypotension: Unknown Has patient had a PCN reaction causing severe rash involving mucus membranes or skin necrosis: Unknown Has patient had a PCN reaction that required hospitalization: Unknown Has patient had a PCN reaction occurring within the last 10 years: No If all of the above answers are "NO", then may proceed with Cephalosporin use.     Patient Measurements: Height: 5\' 6"  (167.6 cm) Weight: 232 lb (105.235 kg) IBW/kg (Calculated) : 63.8  Vital Signs: Temp: 98.2 F (36.8 C) (01/11 1650) Temp Source: Oral (01/11 0716) BP: 150/91 mmHg (01/11 1650) Pulse Rate: 88 (01/11 1650) Intake/Output from previous day:   Intake/Output from this shift: Total I/O In: 3500 [I.V.:3000; IV Piggyback:500] Out: 1600 [Urine:1150; Drains:50; Blood:400]  Labs: No results for input(s): WBC, HGB, PLT, LABCREA, CREATININE in the last 72 hours. Estimated Creatinine Clearance: 103.3 mL/min (by C-G formula based on Cr of 0.76). No results for input(s): VANCOTROUGH, VANCOPEAK, VANCORANDOM, GENTTROUGH, GENTPEAK, GENTRANDOM, TOBRATROUGH, TOBRAPEAK, TOBRARND, AMIKACINPEAK, AMIKACINTROU, AMIKACIN in the last 72 hours.   Microbiology: Recent Results (from the past 720 hour(s))  Surgical pcr screen     Status: None   Collection Time: 01/12/15  8:47 AM  Result Value Ref Range Status   MRSA, PCR NEGATIVE NEGATIVE Final   Staphylococcus aureus NEGATIVE NEGATIVE Final    Comment:        The Xpert SA Assay (FDA approved for NASAL specimens in patients over 67 years of age), is one component of a comprehensive surveillance program.  Test performance has been validated by [facility] for  patients greater than or equal to 67 year old. It is not intended to diagnose infection nor to guide or monitor treatment.     Medical History: Past Medical History  Diagnosis Date  . Arthritis     spinal steniosis  . Spinal stenosis   . Seasonal allergies   . Pneumonia     Hx -when very young  . Benign prostate hyperplasia   . Hypertension     "recently put on rx"  . GERD (gastroesophageal reflux disease)   . Sleep apnea     tested pos on screening- faxed to pcp  . 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 At risk for difficult intubation     no trouble intubating as far as pt knows- swelling afterward -above note previous    Medications:  Prescriptions prior to admission  Medication Sig Dispense Refill Last Dose  . docusate sodium (COLACE) 50 MG capsule Take 50 mg by mouth daily.   01/19/2015 at Unknown time  . gabapentin (NEURONTIN) 300 MG capsule Take 1 capsule (300 mg total) by mouth 3 (three) times daily. 90 capsule 11 01/19/2015 at Unknown time  . lisinopril (PRINIVIL,ZESTRIL) 10 MG tablet Take 10 mg by mouth daily.   01/19/2015 at Unknown time  . methocarbamol (ROBAXIN) 500 MG tablet Take 1 tablet (500 mg total) by mouth 2 (two) times daily with a meal. 60 tablet 0 01/19/2015 at Unknown time  . oxyCODONE-acetaminophen (PERCOCET/ROXICET) 5-325 MG tablet Take 1 tablet by mouth every 6 (six) hours as needed for  moderate pain.    01/19/2015 at Unknown time  . pyridOXINE (VITAMIN B-6) 50 MG tablet Take 50 mg by mouth daily.   01/14/2015 at Unknown time  . Tamsulosin HCl (FLOMAX) 0.4 MG CAPS Take 0.4 mg by mouth every evening.    01/19/2015 at Unknown time  . [DISCONTINUED] diclofenac sodium (VOLTAREN) 1 % GEL Apply 1 g topically daily as needed (pain).   01/14/2015  . [DISCONTINUED] HYDROcodone-acetaminophen (NORCO/VICODIN) 5-325 MG per tablet Take 1 tablet by mouth every evening. Reported  on 12/30/2014   Past Month at Unknown time  . [DISCONTINUED] meloxicam (MOBIC) 15 MG tablet Take 15 mg by mouth every morning.    01/14/2015  . [DISCONTINUED] traMADol (ULTRAM) 50 MG tablet Take 50 mg by mouth every morning.    01/19/2015 at Unknown time  . aspirin 81 MG tablet Take 81 mg by mouth daily.   01/14/2015   Assessment: 65 YOM s/p lumbar fusion with JP drain in place to continue Vancomycin for surgical prophylaxis.   Goal of Therapy:  Vancomycin trough level 15-20 mcg/ml  Plan:  -Continue Vancomycin 1 gm IV Q 12 hours  -Monitor CBC and clinical progress -VT as needed   Albertina Parr, PharmD., BCPS Clinical Pharmacist Pager [phone]

## 2015-01-21 MED FILL — Sodium Chloride IV Soln 0.9%: INTRAVENOUS | Qty: 1000 | Status: AC

## 2015-01-21 MED FILL — Heparin Sodium (Porcine) Inj 1000 Unit/ML: INTRAMUSCULAR | Qty: 30 | Status: AC

## 2015-01-21 NOTE — Evaluation (Signed)
Occupational Therapy Evaluation Patient Details Name: Walter White MRN: [id number] DOB: 01/17/1950 Today's Date: 01/21/2015    History of Present Illness Pt is a 65 y/o male with a PMH significant for TKA 09/20/14. Pt presents s/p L4-L5 PLIF on 01/20/15.   Clinical Impression   Pt reports he was independent with ADLs PTA. Currently pt is overall supervision for safety with ADLs and functional mobility with the exception of min-mod assist with LB ADLs. Pt reports wife can assist with ADLs as needed. Pt able to recall 2/3 back precautions; reviewed all precautions with pt in relation to ADL/IADL activities. Pt demonstrated ability to maintain back precautions throughout functional activities. Pt planning to d/c home with 24/7 supervision from his wife. All education complete; pt with no further questions or concerns for OT at this time. Pt ready to d/c from OT standpoint; signing off at this time. Thank you for this referral.    Follow Up Recommendations  No OT follow up;Supervision - Intermittent    Equipment Recommendations  None recommended by OT    Recommendations for Other Services       Precautions / Restrictions Precautions Precautions: Fall;Back Precaution Booklet Issued: Yes (comment) Precaution Comments: Pt able to recall 2/3 precautions; reviewed all precautions and in relation to ADL/IADL activities Required Braces or Orthoses: Spinal Brace Spinal Brace: Thoracolumbosacral orthotic;Applied in sitting position Restrictions Weight Bearing Restrictions: No      Mobility Bed Mobility               General bed mobility comments: Pt OOB in chair  Transfers Overall transfer level: Needs assistance Equipment used: None Transfers: Sit to/from Stand Sit to Stand: Supervision         General transfer comment: Supervision for safety; no physical assist required.     Balance Overall balance assessment: No apparent balance deficits (not formally assessed)                                           ADL Overall ADL's : Needs assistance/impaired Eating/Feeding: Set up;Sitting   Grooming: Supervision/safety;Standing Grooming Details (indicate cue type and reason): Educated on compensatory strategies to maintain back precautions during oral care Upper Body Bathing: Supervision/ safety;Sitting   Lower Body Bathing: Minimal assistance;Sit to/from stand   Upper Body Dressing : Supervision/safety;Sitting Upper Body Dressing Details (indicate cue type and reason): Pt with no questions regrading donning/doffing TLSO; reports wife will assist as needed. Lower Body Dressing: Moderate assistance;Sit to/from stand Lower Body Dressing Details (indicate cue type and reason): Pt unable to cross opposite foot over knee. Discussed use of AE for increased independence; reports wife can assist. Toilet Transfer: Supervision/safety;Regular Toilet;Grab bars   Toileting- Clothing Manipulation and Hygiene: Supervision/safety;Sit to/from stand   Tub/ Shower Transfer: Supervision/safety;Walk-in shower;Ambulation;Shower seat   Functional mobility during ADLs: Supervision/safety General ADL Comments: No family present for OT eval. Eduated on maintaining back precautions during ADL/IADL activities, supervision for safety with shower transfer, moving frequently used items to countertop height, ice for edema/pain; pt verbalized understanding.     Vision     Perception     Praxis      Pertinent Vitals/Pain Pain Assessment: Faces Faces Pain Scale: Hurts little more Pain Location: back Pain Descriptors / Indicators: Sore Pain Intervention(s): Limited activity within patient's tolerance;Monitored during session     Hand Dominance Right   Extremity/Trunk Assessment Upper Extremity Assessment Upper Extremity  Assessment: Overall WFL for tasks assessed     Cervical / Trunk Assessment Cervical / Trunk Assessment: Normal   Communication  Communication Communication: No difficulties   Cognition Arousal/Alertness: Awake/alert Behavior During Therapy: WFL for tasks assessed/performed Overall Cognitive Status: Within Functional Limits for tasks assessed                     General Comments       Exercises       Shoulder Instructions      Home Living Family/patient expects to be discharged to:: Private residence Living Arrangements: Spouse/significant other Available Help at Discharge: Family;Available 24 hours/day Type of Home: House Home Access: Stairs to enter [employer] of Steps: 4 Entrance Stairs-Rails: Right;Left;Can reach both Home Layout: One level     Bathroom Shower/Tub: Occupational psychologist: Handicapped height (Also has standard ) Bathroom Accessibility: Yes How Accessible: Accessible via walker Home Equipment: [place] - 2 wheels;Cane - single point;Bedside commode;Shower seat;Shower seat - built in;Hand held shower head;Toilet riser          Prior Functioning/Environment Level of Independence: Independent             OT Diagnosis: Acute pain   OT Problem List:     OT Treatment/Interventions:      OT Goals(Current goals can be found in the care plan section) Acute Rehab OT Goals OT Goal Formulation: All assessment and education complete, DC therapy  OT Frequency:     Barriers to D/C:            Co-evaluation              End of Session Equipment Utilized During Treatment: Back brace  Activity Tolerance: Patient tolerated treatment well Patient left: in chair;with call bell/phone within reach   Time: [id number] OT Time Calculation (min): 15 min Charges:  OT General Charges $OT Visit: 1 Procedure OT Evaluation $OT Eval Moderate Complexity: 1 Procedure G-Codes:     Binnie Kand M.S., OTR/L Pager: [phone]  01/21/2015, 9:14 AM

## 2015-01-21 NOTE — Progress Notes (Signed)
    Patient doing well Patient reports resolution of preop bilateral leg pain, + LBP Has been ambulating   Physical Exam: Filed Vitals:   01/20/15 2338 01/21/15 0400  BP: 130/70 140/74  Pulse: 71 75  Temp: 98.2 F (36.8 C) 98.5 F (36.9 C)  Resp: 20 18    Dressing in place NVI  POD #1 s/p L3-5 decompression and L4/5 fusion, doing very well  - up with PT/OT, encourage ambulation - Percocet for pain, Valium for muscle spasms - likely d/c home later today if patient progresses will with PT

## 2015-01-21 NOTE — Progress Notes (Signed)
Patient alert and oriented, mae's well, voiding adequate amount of urine, swallowing without difficulty, no c/o pain. Patient discharged home with family. Script and discharged instructions given to patient. Patient and family stated understanding of d/c instructions given and has an appointment with MD. 

## 2015-01-21 NOTE — Evaluation (Signed)
Physical Therapy Evaluation and Discharge Patient Details Name: Walter White MRN: [id number] DOB: 1950-12-22 Today's Date: 01/21/2015   History of Present Illness  Pt is a 65 y/o male with a PMH significant for TKA 09/20/14. Pt presents s/p L4-L5 PLIF on 01/20/15.  Clinical Impression  Patient evaluated by Physical Therapy with no further acute PT needs identified. All education has been completed and the patient has no further questions. At the time of PT eval pt was able to perform transfers and ambulation with supervision to modified independence. See below for any follow-up Physical Therapy or equipment needs. PT is signing off. Thank you for this referral.    Follow Up Recommendations Outpatient PT;Supervision - Intermittent    Equipment Recommendations  None recommended by PT    Recommendations for Other Services       Precautions / Restrictions Precautions Precautions: Fall;Back Precaution Booklet Issued: Yes (comment) Precaution Comments: Reviewed handout and discussed back precautions during functional mobility.  Required Braces or Orthoses: Spinal Brace Spinal Brace: Thoracolumbosacral orthotic;Applied in sitting position Restrictions Weight Bearing Restrictions: No      Mobility  Bed Mobility               General bed mobility comments: Pt sitting in recliner upon PT arrival. Reviewed log roll technique.   Transfers Overall transfer level: Needs assistance Equipment used: None Transfers: Sit to/from Stand Sit to Stand: Min guard         General transfer comment: Close guard for safety initially. Pt with no LOB or unsteadiness.   Ambulation/Gait Ambulation/Gait assistance: Supervision;Modified independent (Device/Increase time) Ambulation Distance (Feet): 500 Feet Assistive device: None Gait Pattern/deviations: Step-through pattern;Decreased stride length;Wide base of support Gait velocity: Decreased Gait velocity interpretation: Below normal speed  for age/gender General Gait Details: Pt initially supervision for safety however progressed to modified independence by end of gait training. No LOB or assistance required.   Stairs Stairs: Yes Stairs assistance: Supervision Stair Management: One rail Right;Alternating pattern;Step to pattern;Forwards Number of Stairs: 10 General stair comments: Pt was cued for sequencing and general safety   Wheelchair Mobility    Modified Rankin (Stroke Patients Only)       Balance Overall balance assessment: No apparent balance deficits (not formally assessed)                                           Pertinent Vitals/Pain Pain Assessment: No/denies pain    Home Living Family/patient expects to be discharged to:: Private residence Living Arrangements: Spouse/significant other Available Help at Discharge: Family;Available 24 hours/day Type of Home: House Home Access: Stairs to enter Entrance Stairs-Rails: Right;Left;Can reach both Entrance Stairs-Number of Steps: 4 Home Layout: One level Home Equipment: Walker - 2 wheels;Cane - single point;Bedside commode;Shower seat;Shower seat - built in;Hand held shower head;Toilet riser      Prior Function Level of Independence: Independent               Hand Dominance   Dominant Hand: Right    Extremity/Trunk Assessment   Upper Extremity Assessment: Defer to OT evaluation;Overall WFL for tasks assessed           Lower Extremity Assessment: LLE deficits/detail   LLE Deficits / Details: Decreased strength and AROM consistent with recent TKA  Cervical / Trunk Assessment: Normal  Communication   Communication: No difficulties  Cognition Arousal/Alertness: Awake/alert Behavior During Therapy: WFL for  tasks assessed/performed Overall Cognitive Status: Within Functional Limits for tasks assessed                      General Comments      Exercises        Assessment/Plan    PT Assessment  Patent does not need any further PT services  PT Diagnosis Acute pain   PT Problem List    PT Treatment Interventions     PT Goals (Current goals can be found in the Care Plan section) Acute Rehab PT Goals PT Goal Formulation: All assessment and education complete, DC therapy    Frequency     Barriers to discharge        Co-evaluation               End of Session Equipment Utilized During Treatment: Back brace Activity Tolerance: Patient tolerated treatment well Patient left: in chair;with call bell/phone within reach Nurse Communication: Mobility status         Time: 0812-0833 PT Time Calculation (min) (ACUTE ONLY): 21 min   Charges:   PT Evaluation $PT Eval Moderate Complexity: 1 Procedure     PT G Codes:        Rolinda Roan 01-23-15, 9:05 AM   Rolinda Roan, PT, DPT Acute Rehabilitation Services Pager: [phone]

## 2015-02-03 DIAGNOSIS — Z9889 Other specified postprocedural states: Secondary | ICD-10-CM | POA: Diagnosis not present

## 2015-02-03 NOTE — Discharge Summary (Signed)
Patient ID: Walter White MRN: [id number] DOB/AGE: Oct 14, 1950 65 y.o.  Admit date: 01/20/2015 Discharge date: 01/21/2015  Admission Diagnoses:  Active Problems:   Neurogenic claudication   Discharge Diagnoses:  Same  Past Medical History  Diagnosis Date  . Arthritis     spinal steniosis  . Spinal stenosis   . Seasonal allergies   . Pneumonia     Hx -when very young  . Benign prostate hyperplasia   . Hypertension     "recently put on rx"  . GERD (gastroesophageal reflux disease)   . Sleep apnea     tested pos on screening- faxed to pcp  . 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 At risk for difficult intubation     no trouble intubating as far as pt knows- swelling afterward -above note previous    Surgeries: Procedure(s): POSTERIOR LUMBAR DECOMPRESSION L3-5 AND LUMBAR FUSION 1 LEVEL L4-5 on 01/20/2015   Consultants:  None  Discharged Condition: Improved  Hospital Course: Walter White is an 65 y.o. male who was admitted 01/20/2015 for operative treatment of neurogenic claudication. Patient has severe unremitting pain that affects sleep, daily activities, and work/hobbies. After pre-op clearance the patient was taken to the operating room on 01/20/2015 and underwent  Procedure(s): POSTERIOR LUMBAR DECOMPRESSION L3-5 AND LUMBAR FUSION 1 LEVEL L4-5.    Patient was given perioperative antibiotics:  Anti-infectives    Start     Dose/Rate Route Frequency Ordered Stop   01/20/15 2000  vancomycin (VANCOCIN) IVPB 1000 mg/200 mL premix  Status:  Discontinued     1,000 mg 200 mL/hr over 60 Minutes Intravenous Every 12 hours 01/20/15 1715 01/21/15 1149   01/20/15 0800  vancomycin (VANCOCIN) 1,500 mg in sodium chloride 0.9 % 500 mL IVPB     1,500 mg 250 mL/hr over 120 Minutes Intravenous To [facility] 01/19/15 1249 01/20/15 0915       Patient was given sequential  compression devices, early ambulation to prevent DVT.  Patient benefited maximally from hospital stay and there were no complications.    Recent vital signs: BP 124/67 mmHg  Pulse 81  Temp(Src) 98.8 F (37.1 C) (Oral)  Resp 18  Ht 5\' 6"  (1.676 m)  Wt 105.235 kg (232 lb)  BMI 37.46 kg/m2  SpO2 96%  Discharge Medications:     Medication List    TAKE these medications        aspirin 81 MG tablet  Take 81 mg by mouth daily.     docusate sodium 50 MG capsule  Commonly known as:  COLACE  Take 50 mg by mouth daily.     gabapentin 300 MG capsule  Commonly known as:  NEURONTIN  Take 1 capsule (300 mg total) by mouth 3 (three) times daily.     lisinopril 10 MG tablet  Commonly known as:  PRINIVIL,ZESTRIL  Take 10 mg by mouth daily.     methocarbamol 500 MG tablet  Commonly known as:  ROBAXIN  Take 1 tablet (500 mg total) by mouth 2 (two) times daily with a meal.     oxyCODONE-acetaminophen 5-325 MG tablet  Commonly known as:  PERCOCET/ROXICET  Take 1 tablet by mouth every 6 (six) hours as needed for moderate pain.     pyridOXINE 50 MG tablet  Commonly known as:  VITAMIN B-6  Take 50 mg by mouth daily.  tamsulosin 0.4 MG Caps capsule  Commonly known as:  FLOMAX  Take 0.4 mg by mouth every evening.        Diagnostic Studies: Dg Lumbar Spine 2-3 Views  01/20/2015  CLINICAL DATA:  L4-5 TLIF EXAM: DG C-ARM GT 120 MIN; LUMBAR SPINE - 2-3 VIEW COMPARISON:  01/20/2015 FINDINGS: Lateral and frontal images are performed, demonstrating posterior fusion at L4-5 with interbody fusion device noted. Surgical drain is identified posterior to lumbar spine. IMPRESSION: Posterior fusion L4-5. Electronically Signed   By: Nolon Nations M.D.   On: 01/20/2015 15:17   Mr Cervical Spine Wo Contrast  01/13/2015  CLINICAL DATA:  Cervical radiculopathy. Bilateral hand numbness and weakness. EXAM: MRI CERVICAL SPINE WITHOUT CONTRAST TECHNIQUE: Multiplanar, multisequence MR imaging of the  cervical spine was performed. No intravenous contrast was administered. COMPARISON:  None. FINDINGS: Image quality degraded by mild motion Normal alignment. Negative for fracture or mass. No cord compression. Spinal cord signal normal. There is some artifact in the cord due to motion. Cervical medullary junction normal C2-3:  Negative C3-4:  Negative C4-5 negative C5-6:  Negative C6-7: Mild disc bulging without significant spinal or foraminal stenosis C7-T1:  Negative IMPRESSION: Mild disc bulging at C6-7. Negative for neural impingement or spinal stenosis. Electronically Signed   By: Franchot Gallo M.D.   On: 01/13/2015 15:17   Dg Lumbar Spine 1 View  01/20/2015  CLINICAL DATA:  L4-5 TLIF EXAM: LUMBAR SPINE - 1 VIEW COMPARISON:  None FINDINGS: Single lateral portable radiograph of the lumbar spine shows a surgical probe posterior to the spinous process of L3 and a second probe posterior to the spinous process of L5. IMPRESSION: 1. Surgical probes localize L3 and L5 spinous processes. Electronically Signed   By: Kerby Moors M.D.   On: 01/20/2015 10:38   Dg C-arm Gt 120 Min  01/20/2015  CLINICAL DATA:  L4-5 TLIF EXAM: DG C-ARM GT 120 MIN; LUMBAR SPINE - 2-3 VIEW COMPARISON:  01/20/2015 FINDINGS: Lateral and frontal images are performed, demonstrating posterior fusion at L4-5 with interbody fusion device noted. Surgical drain is identified posterior to lumbar spine. IMPRESSION: Posterior fusion L4-5. Electronically Signed   By: Nolon Nations M.D.   On: 01/20/2015 15:17    Disposition: 01-Home or Self Care   POD #1 s/p L3-5 decompression and L4/5 fusion, doing very well  - up with PT/OT, encourage ambulation - Percocet for pain, Valium for muscle spasms -Written scripts for pain signed and in chart -D/C instructions sheet printed and in chart -D/C today  -F/U in office 2 weeks   Signed: Justice Britain 02/03/2015, 11:45 AM

## 2015-02-10 ENCOUNTER — Encounter: Payer: Self-pay | Admitting: Internal Medicine

## 2015-03-04 DIAGNOSIS — G5602 Carpal tunnel syndrome, left upper limb: Secondary | ICD-10-CM | POA: Diagnosis not present

## 2015-03-04 DIAGNOSIS — M25562 Pain in left knee: Secondary | ICD-10-CM | POA: Diagnosis not present

## 2015-03-04 DIAGNOSIS — G5622 Lesion of ulnar nerve, left upper limb: Secondary | ICD-10-CM | POA: Diagnosis not present

## 2015-03-05 DIAGNOSIS — M4806 Spinal stenosis, lumbar region: Secondary | ICD-10-CM | POA: Diagnosis not present

## 2015-04-12 DIAGNOSIS — L820 Inflamed seborrheic keratosis: Secondary | ICD-10-CM | POA: Diagnosis not present

## 2015-04-12 DIAGNOSIS — B079 Viral wart, unspecified: Secondary | ICD-10-CM | POA: Diagnosis not present

## 2015-04-12 DIAGNOSIS — Z1283 Encounter for screening for malignant neoplasm of skin: Secondary | ICD-10-CM | POA: Diagnosis not present

## 2015-04-16 DIAGNOSIS — M545 Low back pain: Secondary | ICD-10-CM | POA: Diagnosis not present

## 2015-04-22 DIAGNOSIS — M4326 Fusion of spine, lumbar region: Secondary | ICD-10-CM | POA: Diagnosis not present

## 2015-04-22 DIAGNOSIS — M545 Low back pain: Secondary | ICD-10-CM | POA: Diagnosis not present

## 2015-04-27 DIAGNOSIS — M545 Low back pain: Secondary | ICD-10-CM | POA: Diagnosis not present

## 2015-04-27 DIAGNOSIS — M4326 Fusion of spine, lumbar region: Secondary | ICD-10-CM | POA: Diagnosis not present

## 2015-04-29 DIAGNOSIS — M545 Low back pain: Secondary | ICD-10-CM | POA: Diagnosis not present

## 2015-04-29 DIAGNOSIS — M4326 Fusion of spine, lumbar region: Secondary | ICD-10-CM | POA: Diagnosis not present

## 2015-04-30 DIAGNOSIS — G5622 Lesion of ulnar nerve, left upper limb: Secondary | ICD-10-CM | POA: Diagnosis not present

## 2015-04-30 DIAGNOSIS — G5602 Carpal tunnel syndrome, left upper limb: Secondary | ICD-10-CM | POA: Diagnosis not present

## 2015-05-11 DIAGNOSIS — M545 Low back pain: Secondary | ICD-10-CM | POA: Diagnosis not present

## 2015-05-11 DIAGNOSIS — M4326 Fusion of spine, lumbar region: Secondary | ICD-10-CM | POA: Diagnosis not present

## 2015-05-12 DIAGNOSIS — Z9889 Other specified postprocedural states: Secondary | ICD-10-CM | POA: Diagnosis not present

## 2015-05-13 DIAGNOSIS — M4326 Fusion of spine, lumbar region: Secondary | ICD-10-CM | POA: Diagnosis not present

## 2015-05-13 DIAGNOSIS — M545 Low back pain: Secondary | ICD-10-CM | POA: Diagnosis not present

## 2015-05-18 DIAGNOSIS — M4326 Fusion of spine, lumbar region: Secondary | ICD-10-CM | POA: Diagnosis not present

## 2015-05-18 DIAGNOSIS — M545 Low back pain: Secondary | ICD-10-CM | POA: Diagnosis not present

## 2015-05-20 DIAGNOSIS — M4326 Fusion of spine, lumbar region: Secondary | ICD-10-CM | POA: Diagnosis not present

## 2015-05-20 DIAGNOSIS — M545 Low back pain: Secondary | ICD-10-CM | POA: Diagnosis not present

## 2015-05-24 DIAGNOSIS — M4326 Fusion of spine, lumbar region: Secondary | ICD-10-CM | POA: Diagnosis not present

## 2015-05-24 DIAGNOSIS — M545 Low back pain: Secondary | ICD-10-CM | POA: Diagnosis not present

## 2015-05-26 DIAGNOSIS — M4326 Fusion of spine, lumbar region: Secondary | ICD-10-CM | POA: Diagnosis not present

## 2015-05-26 DIAGNOSIS — M545 Low back pain: Secondary | ICD-10-CM | POA: Diagnosis not present

## 2015-06-09 DIAGNOSIS — E118 Type 2 diabetes mellitus with unspecified complications: Secondary | ICD-10-CM | POA: Diagnosis not present

## 2015-06-09 DIAGNOSIS — I10 Essential (primary) hypertension: Secondary | ICD-10-CM | POA: Diagnosis not present

## 2015-06-10 DIAGNOSIS — M47816 Spondylosis without myelopathy or radiculopathy, lumbar region: Secondary | ICD-10-CM | POA: Diagnosis not present

## 2015-06-15 DIAGNOSIS — G5602 Carpal tunnel syndrome, left upper limb: Secondary | ICD-10-CM | POA: Diagnosis not present

## 2015-06-15 DIAGNOSIS — S82001A Unspecified fracture of right patella, initial encounter for closed fracture: Secondary | ICD-10-CM | POA: Diagnosis not present

## 2015-06-15 DIAGNOSIS — R972 Elevated prostate specific antigen [PSA]: Secondary | ICD-10-CM | POA: Diagnosis not present

## 2015-06-16 DIAGNOSIS — F17211 Nicotine dependence, cigarettes, in remission: Secondary | ICD-10-CM | POA: Diagnosis not present

## 2015-06-16 DIAGNOSIS — Z125 Encounter for screening for malignant neoplasm of prostate: Secondary | ICD-10-CM | POA: Diagnosis not present

## 2015-06-16 DIAGNOSIS — E1122 Type 2 diabetes mellitus with diabetic chronic kidney disease: Secondary | ICD-10-CM | POA: Diagnosis not present

## 2015-06-16 DIAGNOSIS — I129 Hypertensive chronic kidney disease with stage 1 through stage 4 chronic kidney disease, or unspecified chronic kidney disease: Secondary | ICD-10-CM | POA: Diagnosis not present

## 2015-06-16 DIAGNOSIS — E785 Hyperlipidemia, unspecified: Secondary | ICD-10-CM | POA: Diagnosis not present

## 2015-06-22 DIAGNOSIS — N401 Enlarged prostate with lower urinary tract symptoms: Secondary | ICD-10-CM | POA: Diagnosis not present

## 2015-06-22 DIAGNOSIS — R350 Frequency of micturition: Secondary | ICD-10-CM | POA: Diagnosis not present

## 2015-06-22 DIAGNOSIS — N411 Chronic prostatitis: Secondary | ICD-10-CM | POA: Diagnosis not present

## 2015-06-22 DIAGNOSIS — R972 Elevated prostate specific antigen [PSA]: Secondary | ICD-10-CM | POA: Diagnosis not present

## 2015-06-29 DIAGNOSIS — M25562 Pain in left knee: Secondary | ICD-10-CM | POA: Diagnosis not present

## 2015-07-27 DIAGNOSIS — M25562 Pain in left knee: Secondary | ICD-10-CM | POA: Diagnosis not present

## 2015-07-27 DIAGNOSIS — M4696 Unspecified inflammatory spondylopathy, lumbar region: Secondary | ICD-10-CM | POA: Diagnosis not present

## 2015-07-27 DIAGNOSIS — M545 Low back pain: Secondary | ICD-10-CM | POA: Diagnosis not present

## 2015-08-10 DIAGNOSIS — M25562 Pain in left knee: Secondary | ICD-10-CM | POA: Diagnosis not present

## 2015-08-17 DIAGNOSIS — M47816 Spondylosis without myelopathy or radiculopathy, lumbar region: Secondary | ICD-10-CM | POA: Diagnosis not present

## 2015-08-31 DIAGNOSIS — M47816 Spondylosis without myelopathy or radiculopathy, lumbar region: Secondary | ICD-10-CM | POA: Diagnosis not present

## 2015-09-01 DIAGNOSIS — M25562 Pain in left knee: Secondary | ICD-10-CM | POA: Diagnosis not present

## 2015-09-03 ENCOUNTER — Other Ambulatory Visit: Payer: Self-pay

## 2015-09-03 DIAGNOSIS — M47816 Spondylosis without myelopathy or radiculopathy, lumbar region: Secondary | ICD-10-CM | POA: Diagnosis not present

## 2015-09-07 DIAGNOSIS — M47816 Spondylosis without myelopathy or radiculopathy, lumbar region: Secondary | ICD-10-CM | POA: Diagnosis not present

## 2015-09-29 DIAGNOSIS — M1711 Unilateral primary osteoarthritis, right knee: Secondary | ICD-10-CM | POA: Diagnosis not present

## 2015-09-29 DIAGNOSIS — M25562 Pain in left knee: Secondary | ICD-10-CM | POA: Diagnosis not present

## 2015-10-06 DIAGNOSIS — M47816 Spondylosis without myelopathy or radiculopathy, lumbar region: Secondary | ICD-10-CM | POA: Diagnosis not present

## 2015-10-07 DIAGNOSIS — M1711 Unilateral primary osteoarthritis, right knee: Secondary | ICD-10-CM | POA: Diagnosis not present

## 2015-10-11 DIAGNOSIS — M6281 Muscle weakness (generalized): Secondary | ICD-10-CM | POA: Diagnosis not present

## 2015-10-11 DIAGNOSIS — R269 Unspecified abnormalities of gait and mobility: Secondary | ICD-10-CM | POA: Diagnosis not present

## 2015-10-11 DIAGNOSIS — M25662 Stiffness of left knee, not elsewhere classified: Secondary | ICD-10-CM | POA: Diagnosis not present

## 2015-10-11 DIAGNOSIS — Z96652 Presence of left artificial knee joint: Secondary | ICD-10-CM | POA: Diagnosis not present

## 2015-10-13 DIAGNOSIS — R269 Unspecified abnormalities of gait and mobility: Secondary | ICD-10-CM | POA: Diagnosis not present

## 2015-10-13 DIAGNOSIS — Z96652 Presence of left artificial knee joint: Secondary | ICD-10-CM | POA: Diagnosis not present

## 2015-10-13 DIAGNOSIS — M25662 Stiffness of left knee, not elsewhere classified: Secondary | ICD-10-CM | POA: Diagnosis not present

## 2015-10-14 DIAGNOSIS — M1712 Unilateral primary osteoarthritis, left knee: Secondary | ICD-10-CM | POA: Diagnosis not present

## 2015-10-14 DIAGNOSIS — M25662 Stiffness of left knee, not elsewhere classified: Secondary | ICD-10-CM | POA: Diagnosis not present

## 2015-10-19 DIAGNOSIS — M6281 Muscle weakness (generalized): Secondary | ICD-10-CM | POA: Diagnosis not present

## 2015-10-19 DIAGNOSIS — R269 Unspecified abnormalities of gait and mobility: Secondary | ICD-10-CM | POA: Diagnosis not present

## 2015-10-19 DIAGNOSIS — Z96652 Presence of left artificial knee joint: Secondary | ICD-10-CM | POA: Diagnosis not present

## 2015-10-19 DIAGNOSIS — M25662 Stiffness of left knee, not elsewhere classified: Secondary | ICD-10-CM | POA: Diagnosis not present

## 2015-10-21 DIAGNOSIS — M25662 Stiffness of left knee, not elsewhere classified: Secondary | ICD-10-CM | POA: Diagnosis not present

## 2015-10-21 DIAGNOSIS — R269 Unspecified abnormalities of gait and mobility: Secondary | ICD-10-CM | POA: Diagnosis not present

## 2015-10-21 DIAGNOSIS — Z96652 Presence of left artificial knee joint: Secondary | ICD-10-CM | POA: Diagnosis not present

## 2015-10-21 DIAGNOSIS — M6281 Muscle weakness (generalized): Secondary | ICD-10-CM | POA: Diagnosis not present

## 2015-10-21 DIAGNOSIS — M1712 Unilateral primary osteoarthritis, left knee: Secondary | ICD-10-CM | POA: Diagnosis not present

## 2015-10-29 DIAGNOSIS — Z96652 Presence of left artificial knee joint: Secondary | ICD-10-CM | POA: Diagnosis not present

## 2015-10-29 DIAGNOSIS — R269 Unspecified abnormalities of gait and mobility: Secondary | ICD-10-CM | POA: Diagnosis not present

## 2015-10-29 DIAGNOSIS — M25662 Stiffness of left knee, not elsewhere classified: Secondary | ICD-10-CM | POA: Diagnosis not present

## 2015-10-29 DIAGNOSIS — M6281 Muscle weakness (generalized): Secondary | ICD-10-CM | POA: Diagnosis not present

## 2015-11-01 DIAGNOSIS — Z96652 Presence of left artificial knee joint: Secondary | ICD-10-CM | POA: Diagnosis not present

## 2015-11-01 DIAGNOSIS — M25662 Stiffness of left knee, not elsewhere classified: Secondary | ICD-10-CM | POA: Diagnosis not present

## 2015-11-01 DIAGNOSIS — M6281 Muscle weakness (generalized): Secondary | ICD-10-CM | POA: Diagnosis not present

## 2015-11-01 DIAGNOSIS — R269 Unspecified abnormalities of gait and mobility: Secondary | ICD-10-CM | POA: Diagnosis not present

## 2015-11-02 DIAGNOSIS — M1712 Unilateral primary osteoarthritis, left knee: Secondary | ICD-10-CM | POA: Diagnosis not present

## 2015-11-05 DIAGNOSIS — M48062 Spinal stenosis, lumbar region with neurogenic claudication: Secondary | ICD-10-CM | POA: Diagnosis not present

## 2015-11-10 DIAGNOSIS — R269 Unspecified abnormalities of gait and mobility: Secondary | ICD-10-CM | POA: Diagnosis not present

## 2015-11-10 DIAGNOSIS — M25662 Stiffness of left knee, not elsewhere classified: Secondary | ICD-10-CM | POA: Diagnosis not present

## 2015-11-10 DIAGNOSIS — M6281 Muscle weakness (generalized): Secondary | ICD-10-CM | POA: Diagnosis not present

## 2015-11-10 DIAGNOSIS — Z96652 Presence of left artificial knee joint: Secondary | ICD-10-CM | POA: Diagnosis not present

## 2015-11-16 DIAGNOSIS — M545 Low back pain: Secondary | ICD-10-CM | POA: Diagnosis not present

## 2015-11-17 DIAGNOSIS — L820 Inflamed seborrheic keratosis: Secondary | ICD-10-CM | POA: Diagnosis not present

## 2015-11-17 DIAGNOSIS — D225 Melanocytic nevi of trunk: Secondary | ICD-10-CM | POA: Diagnosis not present

## 2015-11-17 DIAGNOSIS — Z1283 Encounter for screening for malignant neoplasm of skin: Secondary | ICD-10-CM | POA: Diagnosis not present

## 2015-11-19 DIAGNOSIS — M5416 Radiculopathy, lumbar region: Secondary | ICD-10-CM | POA: Diagnosis not present

## 2015-11-23 DIAGNOSIS — M1711 Unilateral primary osteoarthritis, right knee: Secondary | ICD-10-CM | POA: Diagnosis not present

## 2015-12-08 DIAGNOSIS — E1122 Type 2 diabetes mellitus with diabetic chronic kidney disease: Secondary | ICD-10-CM | POA: Diagnosis not present

## 2015-12-08 DIAGNOSIS — E785 Hyperlipidemia, unspecified: Secondary | ICD-10-CM | POA: Diagnosis not present

## 2015-12-08 DIAGNOSIS — Z125 Encounter for screening for malignant neoplasm of prostate: Secondary | ICD-10-CM | POA: Diagnosis not present

## 2015-12-08 DIAGNOSIS — Z0000 Encounter for general adult medical examination without abnormal findings: Secondary | ICD-10-CM | POA: Diagnosis not present

## 2015-12-08 DIAGNOSIS — I129 Hypertensive chronic kidney disease with stage 1 through stage 4 chronic kidney disease, or unspecified chronic kidney disease: Secondary | ICD-10-CM | POA: Diagnosis not present

## 2015-12-08 DIAGNOSIS — Z23 Encounter for immunization: Secondary | ICD-10-CM | POA: Diagnosis not present

## 2015-12-09 DIAGNOSIS — M5416 Radiculopathy, lumbar region: Secondary | ICD-10-CM | POA: Diagnosis not present

## 2015-12-16 DIAGNOSIS — E1122 Type 2 diabetes mellitus with diabetic chronic kidney disease: Secondary | ICD-10-CM | POA: Diagnosis not present

## 2015-12-16 DIAGNOSIS — N182 Chronic kidney disease, stage 2 (mild): Secondary | ICD-10-CM | POA: Diagnosis not present

## 2015-12-16 DIAGNOSIS — D709 Neutropenia, unspecified: Secondary | ICD-10-CM | POA: Diagnosis not present

## 2015-12-16 DIAGNOSIS — I129 Hypertensive chronic kidney disease with stage 1 through stage 4 chronic kidney disease, or unspecified chronic kidney disease: Secondary | ICD-10-CM | POA: Diagnosis not present

## 2015-12-21 DIAGNOSIS — R972 Elevated prostate specific antigen [PSA]: Secondary | ICD-10-CM | POA: Diagnosis not present

## 2015-12-21 DIAGNOSIS — M48062 Spinal stenosis, lumbar region with neurogenic claudication: Secondary | ICD-10-CM | POA: Diagnosis not present

## 2015-12-24 ENCOUNTER — Other Ambulatory Visit: Payer: Self-pay | Admitting: Orthopedic Surgery

## 2015-12-28 DIAGNOSIS — R972 Elevated prostate specific antigen [PSA]: Secondary | ICD-10-CM | POA: Diagnosis not present

## 2015-12-28 DIAGNOSIS — R350 Frequency of micturition: Secondary | ICD-10-CM | POA: Diagnosis not present

## 2015-12-28 DIAGNOSIS — N401 Enlarged prostate with lower urinary tract symptoms: Secondary | ICD-10-CM | POA: Diagnosis not present

## 2016-01-10 HISTORY — PX: OTHER SURGICAL HISTORY: SHX169

## 2016-01-18 ENCOUNTER — Encounter (HOSPITAL_COMMUNITY): Payer: Self-pay

## 2016-01-18 NOTE — Pre-Procedure Instructions (Signed)
    Walter White  01/18/2016      [place], [place] [facility] [address] [place] [address] Phone: [phone] Fax: [phone]    Your procedure is scheduled on Wed. Jan 17  Report to [facility] Admitting at 6:30  A.M.  Call this number if you have problems the morning of surgery:  [phone]   Remember:  Do not eat food or drink liquids after midnight on Tues. Jan. 16   Take these medicines the morning of surgery with A SIP OF WATER : gabapentin (neurontin), tramadol, flonase nasal spray if needed             1 week prior to surgery stop: aspirin, advil, motrin, mobic, ibuprofen,BC Powders, Goody's, vitamins/herbal medicines.   Do not wear jewelry.  Do not wear lotions, powders, or cologne, or deoderant.  Do not shave 48 hours prior to surgery.  Men may shave face and neck.  Do not bring valuables to the hospital.  [facility] is not responsible for any belongings or valuables.  Contacts, dentures or bridgework may not be worn into surgery.  Leave your suitcase in the car.  After surgery it may be brought to your room.  For patients admitted to the hospital, discharge time will be determined by your treatment team.  Patients discharged the day of surgery will not be allowed to drive home.    Special instructions: review preparing for surgery  Please read over the following fact sheets that you were given. Coughing and Deep Breathing and MRSA Information

## 2016-01-19 ENCOUNTER — Encounter (HOSPITAL_COMMUNITY)
Admission: RE | Admit: 2016-01-19 | Discharge: 2016-01-19 | Disposition: A | Discharge status: Home / Self Care | Payer: Medicare Other | Source: Ambulatory Visit | Attending: Orthopedic Surgery | Admitting: Orthopedic Surgery

## 2016-01-19 ENCOUNTER — Encounter (HOSPITAL_COMMUNITY): Payer: Self-pay

## 2016-01-19 DIAGNOSIS — M79661 Pain in right lower leg: Secondary | ICD-10-CM | POA: Insufficient documentation

## 2016-01-19 DIAGNOSIS — I10 Essential (primary) hypertension: Secondary | ICD-10-CM | POA: Diagnosis not present

## 2016-01-19 DIAGNOSIS — Z01818 Encounter for other preprocedural examination: Secondary | ICD-10-CM | POA: Diagnosis not present

## 2016-01-19 DIAGNOSIS — Z7982 Long term (current) use of aspirin: Secondary | ICD-10-CM | POA: Diagnosis not present

## 2016-01-19 DIAGNOSIS — Z79899 Other long term (current) drug therapy: Secondary | ICD-10-CM | POA: Insufficient documentation

## 2016-01-19 DIAGNOSIS — Z87891 Personal history of nicotine dependence: Secondary | ICD-10-CM | POA: Insufficient documentation

## 2016-01-19 DIAGNOSIS — M79662 Pain in left lower leg: Secondary | ICD-10-CM | POA: Diagnosis not present

## 2016-01-19 DIAGNOSIS — N400 Enlarged prostate without lower urinary tract symptoms: Secondary | ICD-10-CM | POA: Insufficient documentation

## 2016-01-19 DIAGNOSIS — K219 Gastro-esophageal reflux disease without esophagitis: Secondary | ICD-10-CM | POA: Insufficient documentation

## 2016-01-19 DIAGNOSIS — M4800 Spinal stenosis, site unspecified: Secondary | ICD-10-CM | POA: Insufficient documentation

## 2016-01-19 DIAGNOSIS — Z01810 Encounter for preprocedural cardiovascular examination: Secondary | ICD-10-CM | POA: Insufficient documentation

## 2016-01-19 DIAGNOSIS — R918 Other nonspecific abnormal finding of lung field: Secondary | ICD-10-CM | POA: Diagnosis not present

## 2016-01-19 LAB — SURGICAL PCR SCREEN
MRSA, PCR: NEGATIVE
STAPHYLOCOCCUS AUREUS: NEGATIVE

## 2016-01-19 LAB — URINALYSIS, ROUTINE W REFLEX MICROSCOPIC
Bilirubin Urine: NEGATIVE
Glucose, UA: NEGATIVE mg/dL
HGB URINE DIPSTICK: NEGATIVE
Ketones, ur: NEGATIVE mg/dL
LEUKOCYTES UA: NEGATIVE
NITRITE: NEGATIVE
PROTEIN: NEGATIVE mg/dL
SPECIFIC GRAVITY, URINE: 1.019 (ref 1.005–1.030)
pH: 5 (ref 5.0–8.0)

## 2016-01-19 LAB — PROTIME-INR
INR: 0.97
Prothrombin Time: 12.9 seconds (ref 11.4–15.2)

## 2016-01-19 LAB — CBC WITH DIFFERENTIAL/PLATELET
BASOS PCT: 1 %
Basophils Absolute: 0 10*3/uL (ref 0.0–0.1)
Eosinophils Absolute: 0.1 10*3/uL (ref 0.0–0.7)
Eosinophils Relative: 3 %
HEMATOCRIT: 38.4 % — AB (ref 39.0–52.0)
HEMOGLOBIN: 13.1 g/dL (ref 13.0–17.0)
Lymphocytes Relative: 23 %
Lymphs Abs: 0.7 10*3/uL (ref 0.7–4.0)
MCH: 30.3 pg (ref 26.0–34.0)
MCHC: 34.1 g/dL (ref 30.0–36.0)
MCV: 88.9 fL (ref 78.0–100.0)
MONOS PCT: 7 %
Monocytes Absolute: 0.2 10*3/uL (ref 0.1–1.0)
NEUTROS ABS: 2 10*3/uL (ref 1.7–7.7)
NEUTROS PCT: 66 %
Platelets: 173 10*3/uL (ref 150–400)
RBC: 4.32 MIL/uL (ref 4.22–5.81)
RDW: 13.6 % (ref 11.5–15.5)
WBC: 3 10*3/uL — AB (ref 4.0–10.5)

## 2016-01-19 LAB — COMPREHENSIVE METABOLIC PANEL
ALBUMIN: 4 g/dL (ref 3.5–5.0)
ALK PHOS: 68 U/L (ref 38–126)
ALT: 32 U/L (ref 17–63)
ANION GAP: 8 (ref 5–15)
AST: 33 U/L (ref 15–41)
BILIRUBIN TOTAL: 0.8 mg/dL (ref 0.3–1.2)
BUN: 17 mg/dL (ref 6–20)
CALCIUM: 9.4 mg/dL (ref 8.9–10.3)
CO2: 23 mmol/L (ref 22–32)
CREATININE: 0.63 mg/dL (ref 0.61–1.24)
Chloride: 105 mmol/L (ref 101–111)
GFR calc Af Amer: >60 mL/min (ref >60)
GFR calc non Af Amer: >60 mL/min (ref >60)
GLUCOSE: 159 mg/dL — AB (ref 65–99)
Potassium: 3.8 mmol/L (ref 3.5–5.1)
SODIUM: 136 mmol/L (ref 135–145)
TOTAL PROTEIN: 8.3 g/dL — AB (ref 6.5–8.1)

## 2016-01-19 LAB — TYPE AND SCREEN
ABO/RH(D): O POS
Antibody Screen: NEGATIVE

## 2016-01-19 LAB — APTT: aPTT: 27 seconds (ref 24–36)

## 2016-01-19 NOTE — Progress Notes (Signed)
PCP: Zacharia Dalton @ [facility]

## 2016-01-19 NOTE — Progress Notes (Signed)
Anesthesia Chart Review: Patient is a 66 year old male scheduled for left sided lateral interbody fusion, L3-4 on 01/26/16 by Dr. Lynann Bologna.  History includes former smoker (quit '82), HTN, GERD, BPH, left TKA 09/21/14, L4-5 PLIF 01/20/15.  History of positive OSA screening tool. For anesthesia history, he reported that his throat swelled after ETT was inserted in the 1980's or 1990's, but has not had any difficultly since.   01/20/15 Anesthesia Record: Intubation Type: IV induction and Cricoid Pressure applied Ventilation: Mask ventilation without difficulty and Oral airway inserted - appropriate to patient size Laryngoscope Size: Mac and 4 Grade View: Grade I Tube type: Oral Tube size: 8.0 mm Number of attempts: 1 Airway Equipment and Method: Rigid stylet Placement Confirmation: ETT inserted through vocal cords under direct vision,  positive ETCO2 and breath sounds checked- equal and bilateral  PCP is listed as Dr. Thressa Sheller.  Meds include ASA 81 mg, Flonase, Neurontin, Norco, lisinopril, Flomax, tramadol.  BP (!) 163/74   Pulse 71   Temp 36.7 C   Resp 20   Ht 5' 7.5" (1.715 m)   Wt 242 lb 8 oz (110 kg)   SpO2 96%   BMI 37.42 kg/m   EKG 01/19/16: NSR, minimal voltage criteria for LVH, may be normal variant. Interpreting cardiologist felt tracing was not significantly changed since 2016.  CXR 01/19/16: IMPRESSION: No active cardiopulmonary disease.  Preoperative labs noted.   If no acute changes then I anticipate that he can proceed as planned.  George Hugh [facility]/Anesthesiology Phone [phone] 01/19/2016 2:17 PM

## 2016-01-19 NOTE — Progress Notes (Signed)
   01/19/16 0921  OBSTRUCTIVE SLEEP APNEA  Have you ever been diagnosed with sleep apnea through a sleep study? No  Do you snore loudly (loud enough to be heard through closed doors)?  0  Do you often feel tired, fatigued, or sleepy during the daytime (such as falling asleep during driving or talking to someone)? 0  Has anyone observed you stop breathing during your sleep? 0  Do you have, or are you being treated for high blood pressure? 1  BMI more than 35 kg/m2? 1  Age > 50 (1-yes) 1  Neck circumference greater than:Male 16 inches or larger, Male 17inches or larger? 1  Male Gender (Yes=1) 1  Obstructive Sleep Apnea Score 5  Score 5 or greater  Results sent to PCP

## 2016-01-25 MED ORDER — VANCOMYCIN HCL 10 G IV SOLR
1500.0000 mg | INTRAVENOUS | Status: AC
  Administered 2016-01-26: 1500 mg via INTRAVENOUS
  Filled 2016-01-25: qty 1500

## 2016-01-26 ENCOUNTER — Inpatient Hospital Stay (HOSPITAL_COMMUNITY): Payer: Medicare Other

## 2016-01-26 ENCOUNTER — Encounter (HOSPITAL_COMMUNITY)
Admission: RE | Disposition: A | Discharge status: Home / Self Care | Payer: Self-pay | Source: Ambulatory Visit | Attending: Orthopedic Surgery

## 2016-01-26 ENCOUNTER — Encounter (HOSPITAL_COMMUNITY): Payer: Self-pay | Admitting: *Deleted

## 2016-01-26 ENCOUNTER — Inpatient Hospital Stay (HOSPITAL_COMMUNITY): Payer: Medicare Other | Admitting: Certified Registered"

## 2016-01-26 ENCOUNTER — Inpatient Hospital Stay (HOSPITAL_COMMUNITY): Payer: Medicare Other | Admitting: Vascular Surgery

## 2016-01-26 ENCOUNTER — Inpatient Hospital Stay (HOSPITAL_COMMUNITY)
Admission: RE | Admit: 2016-01-26 | Discharge: 2016-01-28 | DRG: 455 | Disposition: A | Discharge status: Home / Self Care | Payer: Medicare Other | Source: Ambulatory Visit | Attending: Orthopedic Surgery | Admitting: Orthopedic Surgery

## 2016-01-26 DIAGNOSIS — N400 Enlarged prostate without lower urinary tract symptoms: Secondary | ICD-10-CM | POA: Diagnosis present

## 2016-01-26 DIAGNOSIS — Z79891 Long term (current) use of opiate analgesic: Secondary | ICD-10-CM

## 2016-01-26 DIAGNOSIS — M48061 Spinal stenosis, lumbar region without neurogenic claudication: Secondary | ICD-10-CM | POA: Diagnosis not present

## 2016-01-26 DIAGNOSIS — Z91018 Allergy to other foods: Secondary | ICD-10-CM

## 2016-01-26 DIAGNOSIS — Z96653 Presence of artificial knee joint, bilateral: Secondary | ICD-10-CM | POA: Diagnosis present

## 2016-01-26 DIAGNOSIS — Z791 Long term (current) use of non-steroidal anti-inflammatories (NSAID): Secondary | ICD-10-CM

## 2016-01-26 DIAGNOSIS — M5416 Radiculopathy, lumbar region: Secondary | ICD-10-CM | POA: Diagnosis not present

## 2016-01-26 DIAGNOSIS — Z419 Encounter for procedure for purposes other than remedying health state, unspecified: Secondary | ICD-10-CM

## 2016-01-26 DIAGNOSIS — Z01818 Encounter for other preprocedural examination: Secondary | ICD-10-CM

## 2016-01-26 DIAGNOSIS — Z880 Allergy status to penicillin: Secondary | ICD-10-CM

## 2016-01-26 DIAGNOSIS — Z91013 Allergy to seafood: Secondary | ICD-10-CM | POA: Diagnosis not present

## 2016-01-26 DIAGNOSIS — M79605 Pain in left leg: Secondary | ICD-10-CM | POA: Diagnosis not present

## 2016-01-26 DIAGNOSIS — M5410 Radiculopathy, site unspecified: Secondary | ICD-10-CM | POA: Diagnosis present

## 2016-01-26 DIAGNOSIS — I10 Essential (primary) hypertension: Secondary | ICD-10-CM | POA: Diagnosis present

## 2016-01-26 DIAGNOSIS — Z7982 Long term (current) use of aspirin: Secondary | ICD-10-CM

## 2016-01-26 DIAGNOSIS — M79604 Pain in right leg: Secondary | ICD-10-CM | POA: Diagnosis not present

## 2016-01-26 DIAGNOSIS — M5136 Other intervertebral disc degeneration, lumbar region: Secondary | ICD-10-CM | POA: Diagnosis not present

## 2016-01-26 DIAGNOSIS — Z79899 Other long term (current) drug therapy: Secondary | ICD-10-CM | POA: Diagnosis not present

## 2016-01-26 DIAGNOSIS — G629 Polyneuropathy, unspecified: Secondary | ICD-10-CM | POA: Diagnosis not present

## 2016-01-26 DIAGNOSIS — Z981 Arthrodesis status: Secondary | ICD-10-CM | POA: Diagnosis not present

## 2016-01-26 DIAGNOSIS — M48062 Spinal stenosis, lumbar region with neurogenic claudication: Secondary | ICD-10-CM | POA: Diagnosis not present

## 2016-01-26 DIAGNOSIS — M1712 Unilateral primary osteoarthritis, left knee: Secondary | ICD-10-CM | POA: Diagnosis not present

## 2016-01-26 DIAGNOSIS — M4326 Fusion of spine, lumbar region: Secondary | ICD-10-CM | POA: Diagnosis not present

## 2016-01-26 HISTORY — PX: ANTERIOR LAT LUMBAR FUSION: SHX1168

## 2016-01-26 SURGERY — ANTERIOR LATERAL LUMBAR FUSION 1 LEVEL
Anesthesia: General | Laterality: Left

## 2016-01-26 MED ORDER — FENTANYL CITRATE (PF) 100 MCG/2ML IJ SOLN
INTRAMUSCULAR | Status: AC
  Filled 2016-01-26: qty 2

## 2016-01-26 MED ORDER — LIDOCAINE 2% (20 MG/ML) 5 ML SYRINGE
INTRAMUSCULAR | Status: AC
  Filled 2016-01-26: qty 5

## 2016-01-26 MED ORDER — POTASSIUM CHLORIDE IN NACL 20-0.9 MEQ/L-% IV SOLN
INTRAVENOUS | Status: DC
  Administered 2016-01-26 – 2016-01-27 (×2): via INTRAVENOUS
  Filled 2016-01-26: qty 1000

## 2016-01-26 MED ORDER — POVIDONE-IODINE 7.5 % EX SOLN
Freq: Once | CUTANEOUS | Status: DC
  Filled 2016-01-26: qty 118

## 2016-01-26 MED ORDER — PHENYLEPHRINE 40 MCG/ML (10ML) SYRINGE FOR IV PUSH (FOR BLOOD PRESSURE SUPPORT)
PREFILLED_SYRINGE | INTRAVENOUS | Status: AC
  Filled 2016-01-26: qty 10

## 2016-01-26 MED ORDER — SODIUM CHLORIDE 0.9% FLUSH
3.0000 mL | Freq: Two times a day (BID) | INTRAVENOUS | Status: DC
  Administered 2016-01-26 – 2016-01-28 (×2): 3 mL via INTRAVENOUS

## 2016-01-26 MED ORDER — ACETAMINOPHEN 650 MG RE SUPP: 650.0000 mg | RECTAL | Status: DC | PRN

## 2016-01-26 MED ORDER — DIAZEPAM 5 MG PO TABS
5.0000 mg | ORAL_TABLET | Freq: Four times a day (QID) | ORAL | Status: DC | PRN
  Administered 2016-01-27: 5 mg via ORAL
  Filled 2016-01-26: qty 1

## 2016-01-26 MED ORDER — EPINEPHRINE PF 1 MG/ML IJ SOLN
INTRAMUSCULAR | Status: AC
  Filled 2016-01-26: qty 1

## 2016-01-26 MED ORDER — LIDOCAINE HCL (CARDIAC) 20 MG/ML IV SOLN
INTRAVENOUS | Status: DC | PRN
  Administered 2016-01-26: 50 mg via INTRAVENOUS

## 2016-01-26 MED ORDER — EPHEDRINE SULFATE-NACL 50-0.9 MG/10ML-% IV SOSY
PREFILLED_SYRINGE | INTRAVENOUS | Status: DC | PRN
  Administered 2016-01-26: 5 mg via INTRAVENOUS
  Administered 2016-01-26 (×2): 10 mg via INTRAVENOUS
  Administered 2016-01-26: 5 mg via INTRAVENOUS
  Administered 2016-01-26: 10 mg via INTRAVENOUS

## 2016-01-26 MED ORDER — PROPOFOL 10 MG/ML IV BOLUS
INTRAVENOUS | Status: AC
Start: 2016-01-26 — End: 2016-01-26
  Filled 2016-01-26: qty 20

## 2016-01-26 MED ORDER — HYDROMORPHONE HCL 1 MG/ML IJ SOLN: 0.2500 mg | INTRAMUSCULAR | Status: DC | PRN

## 2016-01-26 MED ORDER — SENNOSIDES-DOCUSATE SODIUM 8.6-50 MG PO TABS: 1.0000 | ORAL_TABLET | Freq: Every evening | ORAL | Status: DC | PRN

## 2016-01-26 MED ORDER — BISACODYL 5 MG PO TBEC
5.0000 mg | DELAYED_RELEASE_TABLET | Freq: Every day | ORAL | Status: DC | PRN
  Administered 2016-01-28: 5 mg via ORAL
  Filled 2016-01-26: qty 1

## 2016-01-26 MED ORDER — OXYCODONE-ACETAMINOPHEN 5-325 MG PO TABS
1.0000 | ORAL_TABLET | ORAL | Status: DC | PRN
  Administered 2016-01-26 – 2016-01-28 (×7): 2 via ORAL
  Filled 2016-01-26 (×7): qty 2

## 2016-01-26 MED ORDER — DEXAMETHASONE SODIUM PHOSPHATE 10 MG/ML IJ SOLN
INTRAMUSCULAR | Status: DC | PRN
  Administered 2016-01-26: 10 mg via INTRAVENOUS

## 2016-01-26 MED ORDER — GLYCOPYRROLATE 0.2 MG/ML IJ SOLN
INTRAMUSCULAR | Status: DC | PRN
  Administered 2016-01-26: 0.2 mg via INTRAVENOUS

## 2016-01-26 MED ORDER — PROPOFOL 10 MG/ML IV BOLUS
INTRAVENOUS | Status: DC | PRN
  Administered 2016-01-26: 60 mg via INTRAVENOUS
  Administered 2016-01-26: 150 mg via INTRAVENOUS

## 2016-01-26 MED ORDER — MORPHINE SULFATE (PF) 2 MG/ML IV SOLN
1.0000 mg | INTRAVENOUS | Status: DC | PRN
  Administered 2016-01-26 – 2016-01-27 (×3): 2 mg via INTRAVENOUS
  Filled 2016-01-26 (×3): qty 1

## 2016-01-26 MED ORDER — ONDANSETRON HCL 4 MG/2ML IJ SOLN
INTRAMUSCULAR | Status: AC
  Filled 2016-01-26: qty 2

## 2016-01-26 MED ORDER — FLEET ENEMA 7-19 GM/118ML RE ENEM: 1.0000 | ENEMA | Freq: Once | RECTAL | Status: DC | PRN

## 2016-01-26 MED ORDER — ZOLPIDEM TARTRATE 5 MG PO TABS: 5.0000 mg | ORAL_TABLET | Freq: Every evening | ORAL | Status: DC | PRN

## 2016-01-26 MED ORDER — PROPOFOL 10 MG/ML IV BOLUS
INTRAVENOUS | Status: AC
  Filled 2016-01-26: qty 20

## 2016-01-26 MED ORDER — SODIUM CHLORIDE 0.9 % IV SOLN: 250.0000 mL | INTRAVENOUS | Status: DC

## 2016-01-26 MED ORDER — HYDROMORPHONE HCL 1 MG/ML IJ SOLN
0.2500 mg | INTRAMUSCULAR | Status: DC | PRN
  Administered 2016-01-26 (×2): 0.5 mg via INTRAVENOUS

## 2016-01-26 MED ORDER — ROCURONIUM BROMIDE 50 MG/5ML IV SOSY
PREFILLED_SYRINGE | INTRAVENOUS | Status: AC
Start: 2016-01-26 — End: 2016-01-26
  Filled 2016-01-26: qty 5

## 2016-01-26 MED ORDER — DEXAMETHASONE SODIUM PHOSPHATE 10 MG/ML IJ SOLN
INTRAMUSCULAR | Status: AC
  Filled 2016-01-26: qty 1

## 2016-01-26 MED ORDER — HYDROMORPHONE HCL 1 MG/ML IJ SOLN
INTRAMUSCULAR | Status: AC
  Filled 2016-01-26: qty 0.5

## 2016-01-26 MED ORDER — BUPIVACAINE HCL (PF) 0.25 % IJ SOLN
INTRAMUSCULAR | Status: AC
  Filled 2016-01-26: qty 30

## 2016-01-26 MED ORDER — FLUTICASONE PROPIONATE 50 MCG/ACT NA SUSP
1.0000 | Freq: Every day | NASAL | Status: DC | PRN
  Administered 2016-01-28: 1 via NASAL
  Filled 2016-01-26: qty 16

## 2016-01-26 MED ORDER — THROMBIN 20000 UNITS EX KIT
PACK | CUTANEOUS | Status: DC | PRN
  Administered 2016-01-26: 20000 [IU] via TOPICAL

## 2016-01-26 MED ORDER — PROPOFOL 500 MG/50ML IV EMUL
INTRAVENOUS | Status: DC | PRN
  Administered 2016-01-26: 25 ug/kg/min via INTRAVENOUS

## 2016-01-26 MED ORDER — FENTANYL CITRATE (PF) 100 MCG/2ML IJ SOLN
INTRAMUSCULAR | Status: DC | PRN
  Administered 2016-01-26 (×2): 50 ug via INTRAVENOUS
  Administered 2016-01-26: 100 ug via INTRAVENOUS
  Administered 2016-01-26 (×2): 25 ug via INTRAVENOUS
  Administered 2016-01-26: 50 ug via INTRAVENOUS

## 2016-01-26 MED ORDER — LACTATED RINGERS IV SOLN
INTRAVENOUS | Status: DC | PRN
  Administered 2016-01-26 (×2): via INTRAVENOUS

## 2016-01-26 MED ORDER — VANCOMYCIN HCL IN DEXTROSE 1-5 GM/200ML-% IV SOLN: 1000.0000 mg | INTRAVENOUS | Status: DC

## 2016-01-26 MED ORDER — MIDAZOLAM HCL 2 MG/2ML IJ SOLN
INTRAMUSCULAR | Status: AC
  Filled 2016-01-26: qty 2

## 2016-01-26 MED ORDER — THROMBIN 20000 UNITS EX SOLR
CUTANEOUS | Status: AC
  Filled 2016-01-26: qty 20000

## 2016-01-26 MED ORDER — SODIUM CHLORIDE 0.9% FLUSH: 3.0000 mL | INTRAVENOUS | Status: DC | PRN

## 2016-01-26 MED ORDER — HEMOSTATIC AGENTS (NO CHARGE) OPTIME
TOPICAL | Status: DC | PRN
  Administered 2016-01-26: 1 via TOPICAL

## 2016-01-26 MED ORDER — PHENOL 1.4 % MT LIQD: 1.0000 | OROMUCOSAL | Status: DC | PRN

## 2016-01-26 MED ORDER — GABAPENTIN 300 MG PO CAPS
300.0000 mg | ORAL_CAPSULE | Freq: Three times a day (TID) | ORAL | Status: DC
  Administered 2016-01-26 – 2016-01-28 (×5): 300 mg via ORAL
  Filled 2016-01-26 (×5): qty 1

## 2016-01-26 MED ORDER — SUCCINYLCHOLINE CHLORIDE 200 MG/10ML IV SOSY
PREFILLED_SYRINGE | INTRAVENOUS | Status: DC | PRN
Start: 2016-01-26 — End: 2016-01-26
  Administered 2016-01-26: 160 mg via INTRAVENOUS

## 2016-01-26 MED ORDER — ACETAMINOPHEN 325 MG PO TABS: 650.0000 mg | ORAL_TABLET | ORAL | Status: DC | PRN

## 2016-01-26 MED ORDER — VANCOMYCIN HCL IN DEXTROSE 1-5 GM/200ML-% IV SOLN
1000.0000 mg | Freq: Once | INTRAVENOUS | Status: AC
  Administered 2016-01-26: 1000 mg via INTRAVENOUS
  Filled 2016-01-26: qty 200

## 2016-01-26 MED ORDER — ONDANSETRON HCL 4 MG/2ML IJ SOLN
INTRAMUSCULAR | Status: DC | PRN
  Administered 2016-01-26: 4 mg via INTRAVENOUS

## 2016-01-26 MED ORDER — MIDAZOLAM HCL 2 MG/2ML IJ SOLN
INTRAMUSCULAR | Status: AC
Start: 2016-01-26 — End: 2016-01-26
  Filled 2016-01-26: qty 2

## 2016-01-26 MED ORDER — TAMSULOSIN HCL 0.4 MG PO CAPS
0.4000 mg | ORAL_CAPSULE | Freq: Every day | ORAL | Status: DC
  Administered 2016-01-26 – 2016-01-27 (×2): 0.4 mg via ORAL
  Filled 2016-01-26 (×2): qty 1

## 2016-01-26 MED ORDER — 0.9 % SODIUM CHLORIDE (POUR BTL) OPTIME
TOPICAL | Status: DC | PRN
  Administered 2016-01-26 (×2): 1000 mL

## 2016-01-26 MED ORDER — MIDAZOLAM HCL 5 MG/5ML IJ SOLN
INTRAMUSCULAR | Status: DC | PRN
  Administered 2016-01-26 (×2): 2 mg via INTRAVENOUS

## 2016-01-26 MED ORDER — MENTHOL 3 MG MT LOZG
1.0000 | LOZENGE | OROMUCOSAL | Status: DC | PRN
  Administered 2016-01-26: 3 mg via ORAL
  Filled 2016-01-26: qty 9

## 2016-01-26 MED ORDER — BUPIVACAINE-EPINEPHRINE 0.25% -1:200000 IJ SOLN
INTRAMUSCULAR | Status: DC | PRN
  Administered 2016-01-26: 4 mL

## 2016-01-26 MED ORDER — PHENYLEPHRINE 40 MCG/ML (10ML) SYRINGE FOR IV PUSH (FOR BLOOD PRESSURE SUPPORT)
PREFILLED_SYRINGE | INTRAVENOUS | Status: DC | PRN
  Administered 2016-01-26: 40 ug via INTRAVENOUS
  Administered 2016-01-26 (×2): 80 ug via INTRAVENOUS
  Administered 2016-01-26 (×5): 40 ug via INTRAVENOUS
  Administered 2016-01-26 (×2): 80 ug via INTRAVENOUS

## 2016-01-26 MED ORDER — ALUM & MAG HYDROXIDE-SIMETH 200-200-20 MG/5ML PO SUSP: 30.0000 mL | Freq: Four times a day (QID) | ORAL | Status: DC | PRN

## 2016-01-26 MED ORDER — LISINOPRIL 10 MG PO TABS
10.0000 mg | ORAL_TABLET | Freq: Every day | ORAL | Status: DC
  Administered 2016-01-26 – 2016-01-27 (×2): 10 mg via ORAL
  Filled 2016-01-26 (×2): qty 1

## 2016-01-26 MED ORDER — SUCCINYLCHOLINE CHLORIDE 200 MG/10ML IV SOSY
PREFILLED_SYRINGE | INTRAVENOUS | Status: AC
  Filled 2016-01-26: qty 10

## 2016-01-26 MED ORDER — ONDANSETRON HCL 4 MG/2ML IJ SOLN
4.0000 mg | INTRAMUSCULAR | Status: DC | PRN
Start: 2016-01-26 — End: 2016-01-28

## 2016-01-26 SURGICAL SUPPLY — 79 items
BENZOIN TINCTURE PRP APPL 2/3 (GAUZE/BANDAGES/DRESSINGS) ×3 IMPLANT
BLADE SURG 10 STRL SS (BLADE) ×3 IMPLANT
BLADE SURG ROTATE 9660 (MISCELLANEOUS) IMPLANT
BOLT 5.0X45 SPINAL (Bolt) ×6 IMPLANT
BOLT SPNL LRG 45X5.5XPLAT NS (Screw) ×2 IMPLANT
BONE VIVIGEN FORMABLE 10CC (Bone Implant) ×3 IMPLANT
CHLORAPREP W/TINT 10.5 ML (MISCELLANEOUS) ×3 IMPLANT
CLOSURE STERI-STRIP 1/2X4 (GAUZE/BANDAGES/DRESSINGS) ×1
CLOSURE WOUND 1/2 X4 (GAUZE/BANDAGES/DRESSINGS) ×1
CLSR STERI-STRIP ANTIMIC 1/2X4 (GAUZE/BANDAGES/DRESSINGS) ×2 IMPLANT
CONT SPEC STER OR (MISCELLANEOUS) ×3 IMPLANT
COROENT XL WIDE 14X22X55MM (Spacer) ×3 IMPLANT
COVER BACK TABLE 80X110 HD (DRAPES) ×3 IMPLANT
COVER SURGICAL LIGHT HANDLE (MISCELLANEOUS) ×3 IMPLANT
DECANTER SPIKE VIAL GLASS SM (MISCELLANEOUS) ×6 IMPLANT
DRAPE C-ARM 42X72 X-RAY (DRAPES) ×3 IMPLANT
DRAPE C-ARMOR (DRAPES) ×3 IMPLANT
DRAPE POUCH INSTRU U-SHP 10X18 (DRAPES) ×3 IMPLANT
DRAPE SURG 17X23 STRL (DRAPES) ×12 IMPLANT
DURAPREP 26ML APPLICATOR (WOUND CARE) IMPLANT
ELECT BLADE 6.5 EXT (BLADE) ×3 IMPLANT
ELECT CAUTERY BLADE 6.4 (BLADE) ×6 IMPLANT
ELECT PENCIL ROCKER SW 15FT (MISCELLANEOUS) ×3 IMPLANT
ELECT REM PT RETURN 9FT ADLT (ELECTROSURGICAL) ×3
ELECTRODE REM PT RTRN 9FT ADLT (ELECTROSURGICAL) ×1 IMPLANT
FEE INTRAOP MONITOR IMPULS NCS (MISCELLANEOUS) ×1 IMPLANT
FILTER STRAW FLUID ASPIR (MISCELLANEOUS) ×6 IMPLANT
GAUZE SPONGE 4X4 16PLY XRAY LF (GAUZE/BANDAGES/DRESSINGS) ×3 IMPLANT
GLOVE BIO SURGEON STRL SZ7 (GLOVE) ×6 IMPLANT
GLOVE BIO SURGEON STRL SZ8 (GLOVE) ×3 IMPLANT
GLOVE BIOGEL PI IND STRL 7.0 (GLOVE) ×1 IMPLANT
GLOVE BIOGEL PI IND STRL 8 (GLOVE) ×1 IMPLANT
GLOVE BIOGEL PI INDICATOR 7.0 (GLOVE) ×2
GLOVE BIOGEL PI INDICATOR 8 (GLOVE) ×2
GOWN STRL REUS W/ TWL LRG LVL3 (GOWN DISPOSABLE) ×1 IMPLANT
GOWN STRL REUS W/ TWL XL LVL3 (GOWN DISPOSABLE) ×2 IMPLANT
GOWN STRL REUS W/TWL LRG LVL3 (GOWN DISPOSABLE) ×2
GOWN STRL REUS W/TWL XL LVL3 (GOWN DISPOSABLE) ×4
INTRAOP MONITOR FEE IMPULS NCS (MISCELLANEOUS) ×1
INTRAOP MONITOR FEE IMPULSE (MISCELLANEOUS) ×2
KIT BASIN OR (CUSTOM PROCEDURE TRAY) ×3 IMPLANT
KIT DILATOR XLIF 5 (KITS) ×1 IMPLANT
KIT ROOM TURNOVER OR (KITS) ×3 IMPLANT
KIT SURGICAL ACCESS MAXCESS 4 (KITS) ×3 IMPLANT
KIT XLIF (KITS) ×2
MARKER SKIN DUAL TIP RULER LAB (MISCELLANEOUS) ×3 IMPLANT
MODULE EMG NEEDLE SSEP NVM5 (NEEDLE) ×3 IMPLANT
MODULE NVM5 NEXT GEN EMG (NEEDLE) ×3 IMPLANT
NEEDLE HYPO 25GX1X1/2 BEV (NEEDLE) ×3 IMPLANT
NEEDLE SPNL 18GX3.5 QUINCKE PK (NEEDLE) ×3 IMPLANT
NS IRRIG 1000ML POUR BTL (IV SOLUTION) ×6 IMPLANT
PACK LAMINECTOMY ORTHO (CUSTOM PROCEDURE TRAY) ×3 IMPLANT
PACK UNIVERSAL I (CUSTOM PROCEDURE TRAY) ×3 IMPLANT
PAD ARMBOARD 7.5X6 YLW CONV (MISCELLANEOUS) ×6 IMPLANT
PLATE DECADE XLIF 4HOLE SZ14 (Plate) ×3 IMPLANT
SCREW 45MM (Screw) ×4 IMPLANT
SPONGE GAUZE 4X4 12PLY STER LF (GAUZE/BANDAGES/DRESSINGS) ×3 IMPLANT
SPONGE INTESTINAL PEANUT (DISPOSABLE) ×6 IMPLANT
SPONGE LAP 4X18 X RAY DECT (DISPOSABLE) ×3 IMPLANT
SPONGE SURGIFOAM ABS GEL 100 (HEMOSTASIS) ×3 IMPLANT
STAPLER VISISTAT 35W (STAPLE) ×3 IMPLANT
STRIP CLOSURE SKIN 1/2X4 (GAUZE/BANDAGES/DRESSINGS) ×2 IMPLANT
SURFACE SSEP/EMG (MISCELLANEOUS) ×3 IMPLANT
SURGIFLO W/THROMBIN 8M KIT (HEMOSTASIS) IMPLANT
SUT MNCRL AB 4-0 PS2 18 (SUTURE) ×3 IMPLANT
SUT VIC AB 0 CT1 18XCR BRD 8 (SUTURE) ×1 IMPLANT
SUT VIC AB 0 CT1 8-18 (SUTURE) ×2
SUT VIC AB 1 CT1 18XCR BRD 8 (SUTURE) ×1 IMPLANT
SUT VIC AB 1 CT1 8-18 (SUTURE) ×2
SUT VIC AB 2-0 CT2 18 VCP726D (SUTURE) ×3 IMPLANT
SYR BULB IRRIGATION 50ML (SYRINGE) ×3 IMPLANT
SYR CONTROL 10ML LL (SYRINGE) ×3 IMPLANT
SYRINGE 1CC SLIP TB (MISCELLANEOUS) ×6 IMPLANT
TAPE CLOTH SURG 4X10 WHT LF (GAUZE/BANDAGES/DRESSINGS) ×3 IMPLANT
TOWEL OR 17X24 6PK STRL BLUE (TOWEL DISPOSABLE) ×3 IMPLANT
TOWEL OR 17X26 10 PK STRL BLUE (TOWEL DISPOSABLE) ×3 IMPLANT
TRAY FOLEY CATH 16FR SILVER (SET/KITS/TRAYS/PACK) ×3 IMPLANT
WATER STERILE IRR 1000ML POUR (IV SOLUTION) ×3 IMPLANT
YANKAUER SUCT BULB TIP NO VENT (SUCTIONS) ×3 IMPLANT

## 2016-01-26 NOTE — Anesthesia Postprocedure Evaluation (Signed)
Anesthesia Post Note  Patient: Walter White.  Procedure(s) Performed: Procedure(s) (LRB): LEFT SIDED LATERAL INTERBODY FUSION, LUMBAR 3-4 WITH INSTRUMENTATION AND ALLOGRAFT (Left)  Patient location during evaluation: PACU Anesthesia Type: General Level of consciousness: awake Pain management: pain level controlled Vital Signs Assessment: post-procedure vital signs reviewed and stable Respiratory status: spontaneous breathing Cardiovascular status: stable Anesthetic complications: no       Last Vitals:  Vitals:   01/26/16 1324 01/26/16 1327  BP:    Pulse: (!) 58 62  Resp: 13 13  Temp:  36.7 C    Last Pain:  Vitals:   01/26/16 1324  TempSrc:   PainSc: Asleep                 Garrin Kirwan

## 2016-01-26 NOTE — Progress Notes (Signed)
New Admission Note:  Arrival Method: on stretcher from PACU Mental Orientation: A & O x4  Telemetry: n/a Assessment: Completed Skin: surgical incision IV: L hand Pain: 5/10 Tubes: n/a Safety Measures: Safety Fall Prevention Plan was , discussed. Admission: Completed 5C12: Patient has been orientated to the room, unit and the staff. Family: Visiting   Orders have been reviewed and implemented. Will continue to monitor the patient. Call light has been placed within reach and bed alarm has been activated.   Arta Silence ,RN

## 2016-01-26 NOTE — Transfer of Care (Signed)
Immediate Anesthesia Transfer of Care Note  Patient: Walter White.  Procedure(s) Performed: Procedure(s) with comments: LEFT SIDED LATERAL INTERBODY FUSION, LUMBAR 3-4 WITH INSTRUMENTATION AND ALLOGRAFT (Left) - LEFT SIDED LATERAL INTERBODY FUSION, LUMBAR 3-4 WITH INSTRUMENTATION AND ALLOGRAFT  Patient Location: PACU  Anesthesia Type:General  Level of Consciousness: awake, alert , oriented and patient cooperative  Airway & Oxygen Therapy: Patient Spontanous Breathing and Patient connected to nasal cannula oxygen  Post-op Assessment: Report given to RN and Post -op Vital signs reviewed and stable  Post vital signs: Reviewed  Last Vitals: 138/88, 72, 14, 95% Vitals:   01/26/16 0724 01/26/16 0905  BP: (!) 158/98 (!) 130/57  Pulse: 76 85  Resp: 20 20  Temp: 36.8 C 37 C    Last Pain:  Vitals:   01/26/16 0724  TempSrc: Oral  PainSc:          Complications: No apparent anesthesia complications

## 2016-01-26 NOTE — Anesthesia Preprocedure Evaluation (Addendum)
Anesthesia Evaluation  Patient identified by MRN, date of birth, ID band Patient awake    Reviewed: Allergy & Precautions, reviewed documented beta blocker date and time   Airway Mallampati: II       Dental   Pulmonary pneumonia, former smoker,    breath sounds clear to auscultation       Cardiovascular hypertension,  Rhythm:Regular Rate:Normal     Neuro/Psych  Neuromuscular disease    GI/Hepatic Neg liver ROS, GERD  ,  Endo/Other    Renal/GU      Musculoskeletal  (+) Arthritis ,   Abdominal   Peds  Hematology   Anesthesia Other Findings   Reproductive/Obstetrics                             Anesthesia Physical Anesthesia Plan  ASA: III  Anesthesia Plan: General   Post-op Pain Management:    Induction: Intravenous  Airway Management Planned: Oral ETT  Additional Equipment:   Intra-op Plan:   Post-operative Plan: Possible Post-op intubation/ventilation  Informed Consent: I have reviewed the patients History and Physical, chart, labs and discussed the procedure including the risks, benefits and alternatives for the proposed anesthesia with the patient or authorized representative who has indicated his/her understanding and acceptance.   Dental advisory given  Plan Discussed with: CRNA and Anesthesiologist  Anesthesia Plan Comments:         Anesthesia Quick Evaluation

## 2016-01-26 NOTE — Anesthesia Procedure Notes (Signed)
Procedure Name: Intubation Date/Time: 01/26/2016 9:31 AM Performed by: Marchelle Folks ANN Pre-anesthesia Checklist: Patient identified, Emergency Drugs available, Suction available, Patient being monitored and Timeout performed Patient Re-evaluated:Patient Re-evaluated prior to inductionOxygen Delivery Method: Circle system utilized Preoxygenation: Pre-oxygenation with 100% oxygen Intubation Type: IV induction Ventilation: Mask ventilation without difficulty Laryngoscope Size: Mac and 3 Grade View: Grade I Tube type: Oral Tube size: 8.0 mm Placement Confirmation: ETT inserted through vocal cords under direct vision,  positive ETCO2 and breath sounds checked- equal and bilateral Secured at: 24 cm Tube secured with: Tape Dental Injury: Teeth and Oropharynx as per pre-operative assessment

## 2016-01-26 NOTE — Addendum Note (Signed)
Addendum  created 01/26/16 1341 by Sammie Bench, CRNA   Anesthesia Intra Meds edited

## 2016-01-26 NOTE — H&P (Signed)
PREOPERATIVE H&P  Chief Complaint: bilateral leg pain  HPI: Walter Schimpf. is a 66 y.o. male who presents with ongoing pain in the bilateral legs  MRI reveals stenosis at L3/4, the level above his previous fusion  Patient has failed multiple forms of conservative care and continues to have pain (see office notes for additional details regarding the patient's full course of treatment)  Past Medical History:  Diagnosis Date  . Arthritis    spinal steniosis  . At risk for difficult intubation    no trouble intubating as far as pt knows- swelling afterward -above note previous  . Benign prostate hyperplasia   . 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 GERD (gastroesophageal reflux disease)   . Hypertension    "recently put on rx"  . Pneumonia    Hx -when very young  . Seasonal allergies   . Spinal stenosis    Past Surgical History:  Procedure Laterality Date  . BACK SURGERY  01/2015   dr. Lynann Bologna  . CARPAL TUNNEL RELEASE     right   . COLONOSCOPY    . COLONOSCOPY  2010   polyp  . ELBOW SURGERY     right ulnar release  . JOINT REPLACEMENT    . KNEE ARTHROSCOPY  2010   rt   . KNEE ARTHROSCOPY     left x 2  . KNEE ARTHROSCOPY  11/23/2010   Procedure: ARTHROSCOPY KNEE;  Surgeon: Kerin Salen;  Location: [place];  Service: Orthopedics;  Laterality: Left;  arthroscopy left knee  . polypectomy  2010  . SHOULDER ARTHROSCOPY Left    x2  . TOTAL KNEE ARTHROPLASTY Left 09/21/2014   Procedure: TOTAL KNEE ARTHROPLASTY;  Surgeon: Frederik Pear, MD;  Location: [facility];  Service: Orthopedics;  Laterality: Left;  . WISDOM TOOTH EXTRACTION     Social History   Social History  . Marital status: Married    Spouse name: [name]   . Number of children: 2  . Years of education: Some colle   Occupational History  . Retired    Social History Main Topics  .  Smoking status: Former Smoker    Quit date: 11/21/1980  . Smokeless tobacco: Never Used  . Alcohol use 0.0 oz/week     Comment: rare  . Drug use: No  . Sexual activity: Not Asked   Other Topics Concern  . None   Social History Narrative   Lives with wife   Caffeine use: tea every day   Family History  Problem Relation Age of Onset  . Colon cancer Neg Hx   . Colon polyps Neg Hx   . Esophageal cancer Neg Hx   . Rectal cancer Neg Hx   . Stomach cancer Neg Hx    Allergies  Allergen Reactions  . Lactose Intolerance (Gi)     Dairy--allergic per allergy test  . Penicillins     Per pt: unknown Has patient had a PCN reaction causing immediate rash, facial/tongue/throat swelling, SOB or lightheadedness with hypotension: Unknown Has patient had a PCN reaction causing severe rash involving mucus membranes or skin necrosis: Unknown Has patient had a PCN reaction that required hospitalization: Unknown Has patient had a PCN reaction occurring within the last 10 years: No If all of the above answers are "NO", then may proceed with Cephalosporin use.   [name]  Pork-Derived Products Other (See Comments)    Per allergy test  . Shellfish Allergy Other (See Comments)    Per allergy test    Prior to Admission medications   Medication Sig Start Date End Date Taking? Authorizing Provider  aspirin EC 81 MG tablet Take 81 mg by mouth daily.   Yes Historical Provider, MD  docusate sodium (COLACE) 50 MG capsule Take 50 mg by mouth daily.   Yes Historical Provider, MD  fluticasone (FLONASE) 50 MCG/ACT nasal spray Place 1 spray into both nostrils daily as needed. For allergies 10/12/15  Yes Historical Provider, MD  gabapentin (NEURONTIN) 300 MG capsule Take 1 capsule (300 mg total) by mouth 3 (three) times daily. Patient taking differently: Take 300 mg by mouth 2 (two) times daily.  12/30/14  Yes Melvenia Beam, MD  HYDROcodone-acetaminophen (NORCO/VICODIN) 5-325 MG tablet Take 1 tablet by mouth at  bedtime. 11/25/15  Yes Historical Provider, MD  lisinopril (PRINIVIL,ZESTRIL) 10 MG tablet Take 10 mg by mouth at bedtime.    Yes Historical Provider, MD  meloxicam (MOBIC) 15 MG tablet Take 15 mg by mouth daily.   Yes Historical Provider, MD  Nutritional Supplements (JUICE PLUS FIBRE PO) Take 6 capsules by mouth daily. Juice Plus+ Orchard Blend 2 capsules in the morning Juice Plus+ Garden Blend 2 capsules in the morning Juice Plus+ Vineyard Blend 2 capsule in the morning   Yes Historical Provider, MD  Tamsulosin HCl (FLOMAX) 0.4 MG CAPS Take 0.4 mg by mouth at bedtime.    Yes Historical Provider, MD  traMADol (ULTRAM) 50 MG tablet Take 50-100 mg by mouth daily.   Yes Historical Provider, MD     All other systems have been reviewed and were otherwise negative with the exception of those mentioned in the HPI and as above.  Physical Exam: Vitals:   01/26/16 0724  BP: (!) 158/98  Pulse: 76  Resp: 20  Temp: 98.2 F (36.8 C)    General: Alert, no acute distress Cardiovascular: No pedal edema Respiratory: No cyanosis, no use of accessory musculature Skin: No lesions in the area of chief complaint Neurologic: Sensation intact distally Psychiatric: Patient is competent for consent with normal mood and affect Lymphatic: No axillary or cervical lymphadenopathy   Assessment/Plan: Radiculopathy Plan for Procedure(s): LEFT SIDED LATERAL INTERBODY FUSION, LUMBAR 3-4 WITH INSTRUMENTATION AND ALLOGRAFT  Stage 2 is to involve a PSF on the following day   Sinclair Ship, MD 01/26/2016 8:57 AM

## 2016-01-26 NOTE — Progress Notes (Signed)
Pharmacy Antibiotic Note  Walter White. is a 66 y.o. male admitted on 01/26/2016 for spinal surgery.  Pharmacy has been consulted for vancomycin x 1 dose post-op for surgical prophylaxis.  Patient does not have a drain based on documentation.  Preop labs reviewed and noted patient received pre-op vancomycin dose around 0830 today.  Plan: - Vanc 1g IV x 1 at 2030 - Pharmacy will sign off.  Thank you for the consult!   Height: 5' 7.5" (171.5 cm) Weight: 242 lb (109.8 kg) IBW/kg (Calculated) : 67.25  Temp (24hrs), Avg:98 F (36.7 C), Min:97.3 F (36.3 C), Max:98.6 F (37 C)  No results for input(s): WBC, CREATININE, LATICACIDVEN, VANCOTROUGH, VANCOPEAK, VANCORANDOM, GENTTROUGH, GENTPEAK, GENTRANDOM, TOBRATROUGH, TOBRAPEAK, TOBRARND, AMIKACINPEAK, AMIKACINTROU, AMIKACIN in the last 168 hours.  Estimated Creatinine Clearance: 106.8 mL/min (by C-G formula based on SCr of 0.63 mg/dL).    Allergies  Allergen Reactions  . Lactose Intolerance (Gi)     Dairy--allergic per allergy test  . Penicillins     Per pt: unknown Has patient had a PCN reaction causing immediate rash, facial/tongue/throat swelling, SOB or lightheadedness with hypotension: Unknown Has patient had a PCN reaction causing severe rash involving mucus membranes or skin necrosis: Unknown Has patient had a PCN reaction that required hospitalization: Unknown Has patient had a PCN reaction occurring within the last 10 years: No If all of the above answers are "NO", then may proceed with Cephalosporin use.   . Pork-Derived Products Other (See Comments)    Per allergy test  . Shellfish Allergy Other (See Comments)    Per allergy test       Zakeria Kulzer D. Mina [name], PharmD, BCPS Pager:  [phone] 01/26/2016, 2:24 PM

## 2016-01-27 ENCOUNTER — Inpatient Hospital Stay (HOSPITAL_COMMUNITY): Payer: Medicare Other

## 2016-01-27 ENCOUNTER — Inpatient Hospital Stay (HOSPITAL_COMMUNITY): Payer: Medicare Other | Admitting: Anesthesiology

## 2016-01-27 ENCOUNTER — Encounter (HOSPITAL_COMMUNITY)
Admission: RE | Disposition: A | Discharge status: Home / Self Care | Payer: Self-pay | Source: Ambulatory Visit | Attending: Orthopedic Surgery

## 2016-01-27 ENCOUNTER — Inpatient Hospital Stay (HOSPITAL_COMMUNITY): Admission: RE | Admit: 2016-01-27 | Payer: Medicare Other | Source: Ambulatory Visit | Admitting: Orthopedic Surgery

## 2016-01-27 SURGERY — POSTERIOR LUMBAR FUSION 1 LEVEL
Anesthesia: General

## 2016-01-27 MED ORDER — MIDAZOLAM HCL 2 MG/2ML IJ SOLN
INTRAMUSCULAR | Status: AC
  Filled 2016-01-27: qty 2

## 2016-01-27 MED ORDER — HYDROCODONE-ACETAMINOPHEN 5-325 MG PO TABS: 1.0000 | ORAL_TABLET | ORAL | Status: DC | PRN

## 2016-01-27 MED ORDER — DOCUSATE SODIUM 100 MG PO CAPS
100.0000 mg | ORAL_CAPSULE | Freq: Two times a day (BID) | ORAL | Status: DC
  Administered 2016-01-27 – 2016-01-28 (×3): 100 mg via ORAL
  Filled 2016-01-27 (×3): qty 1

## 2016-01-27 MED ORDER — SODIUM CHLORIDE 0.9% FLUSH
3.0000 mL | Freq: Two times a day (BID) | INTRAVENOUS | Status: DC
  Administered 2016-01-27: 3 mL via INTRAVENOUS

## 2016-01-27 MED ORDER — SUCCINYLCHOLINE CHLORIDE 20 MG/ML IJ SOLN
INTRAMUSCULAR | Status: DC | PRN
  Administered 2016-01-27: 120 mg via INTRAVENOUS

## 2016-01-27 MED ORDER — SODIUM CHLORIDE 0.9% FLUSH: 3.0000 mL | INTRAVENOUS | Status: DC | PRN

## 2016-01-27 MED ORDER — ROCURONIUM BROMIDE 50 MG/5ML IV SOSY
PREFILLED_SYRINGE | INTRAVENOUS | Status: AC
  Filled 2016-01-27: qty 10

## 2016-01-27 MED ORDER — BUPIVACAINE HCL (PF) 0.25 % IJ SOLN
INTRAMUSCULAR | Status: AC
  Filled 2016-01-27: qty 30

## 2016-01-27 MED ORDER — LIDOCAINE 2% (20 MG/ML) 5 ML SYRINGE
INTRAMUSCULAR | Status: AC
  Filled 2016-01-27: qty 10

## 2016-01-27 MED ORDER — VANCOMYCIN HCL IN DEXTROSE 1-5 GM/200ML-% IV SOLN
1000.0000 mg | Freq: Once | INTRAVENOUS | Status: AC
  Administered 2016-01-27: 1000 mg via INTRAVENOUS
  Filled 2016-01-27: qty 200

## 2016-01-27 MED ORDER — EPINEPHRINE PF 1 MG/ML IJ SOLN
INTRAMUSCULAR | Status: AC
  Filled 2016-01-27: qty 1

## 2016-01-27 MED ORDER — LIDOCAINE HCL (CARDIAC) 20 MG/ML IV SOLN
INTRAVENOUS | Status: DC | PRN
  Administered 2016-01-27: 100 mg via INTRATRACHEAL

## 2016-01-27 MED ORDER — ROCURONIUM BROMIDE 100 MG/10ML IV SOLN
INTRAVENOUS | Status: DC | PRN
  Administered 2016-01-27: 50 mg via INTRAVENOUS

## 2016-01-27 MED ORDER — 0.9 % SODIUM CHLORIDE (POUR BTL) OPTIME
TOPICAL | Status: DC | PRN
  Administered 2016-01-27 (×2): 1000 mL

## 2016-01-27 MED ORDER — BUPIVACAINE-EPINEPHRINE 0.25% -1:200000 IJ SOLN
INTRAMUSCULAR | Status: DC | PRN
  Administered 2016-01-27: 5 mL

## 2016-01-27 MED ORDER — DOCUSATE SODIUM 50 MG PO CAPS: 50.0000 mg | ORAL_CAPSULE | Freq: Every day | ORAL | Status: DC

## 2016-01-27 MED ORDER — MIDAZOLAM HCL 5 MG/5ML IJ SOLN
INTRAMUSCULAR | Status: DC | PRN
  Administered 2016-01-27: 2 mg via INTRAVENOUS

## 2016-01-27 MED ORDER — ALBUMIN HUMAN 5 % IV SOLN
INTRAVENOUS | Status: DC | PRN
  Administered 2016-01-27: 09:00:00 via INTRAVENOUS

## 2016-01-27 MED ORDER — THROMBIN 20000 UNITS EX SOLR
CUTANEOUS | Status: DC | PRN
  Administered 2016-01-27: 20 mL via TOPICAL

## 2016-01-27 MED ORDER — FENTANYL CITRATE (PF) 100 MCG/2ML IJ SOLN
INTRAMUSCULAR | Status: AC
  Filled 2016-01-27: qty 8

## 2016-01-27 MED ORDER — ONDANSETRON HCL 4 MG/2ML IJ SOLN
INTRAMUSCULAR | Status: AC
  Filled 2016-01-27: qty 2

## 2016-01-27 MED ORDER — PHENYLEPHRINE HCL 10 MG/ML IJ SOLN
INTRAVENOUS | Status: DC | PRN
  Administered 2016-01-27: 50 ug/min via INTRAVENOUS

## 2016-01-27 MED ORDER — SUGAMMADEX SODIUM 200 MG/2ML IV SOLN
INTRAVENOUS | Status: AC
  Filled 2016-01-27: qty 2

## 2016-01-27 MED ORDER — LACTATED RINGERS IV SOLN
INTRAVENOUS | Status: DC | PRN
  Administered 2016-01-27: 07:00:00 via INTRAVENOUS

## 2016-01-27 MED ORDER — SUCCINYLCHOLINE CHLORIDE 200 MG/10ML IV SOSY
PREFILLED_SYRINGE | INTRAVENOUS | Status: AC
  Filled 2016-01-27: qty 10

## 2016-01-27 MED ORDER — DEXAMETHASONE SODIUM PHOSPHATE 10 MG/ML IJ SOLN: INTRAMUSCULAR | Status: DC | PRN

## 2016-01-27 MED ORDER — VANCOMYCIN HCL IN DEXTROSE 1-5 GM/200ML-% IV SOLN
INTRAVENOUS | Status: AC
  Filled 2016-01-27: qty 200

## 2016-01-27 MED ORDER — PROPOFOL 10 MG/ML IV BOLUS
INTRAVENOUS | Status: AC
  Filled 2016-01-27: qty 20

## 2016-01-27 MED ORDER — PHENYLEPHRINE 40 MCG/ML (10ML) SYRINGE FOR IV PUSH (FOR BLOOD PRESSURE SUPPORT)
PREFILLED_SYRINGE | INTRAVENOUS | Status: AC
  Filled 2016-01-27: qty 10

## 2016-01-27 MED ORDER — VANCOMYCIN HCL 1000 MG IV SOLR
1000.0000 mg | Freq: Two times a day (BID) | INTRAVENOUS | Status: DC
  Administered 2016-01-27: 1000 mg via INTRAVENOUS

## 2016-01-27 MED ORDER — SODIUM CHLORIDE 0.9 % IV SOLN: 250.0000 mL | INTRAVENOUS | Status: DC

## 2016-01-27 MED ORDER — THROMBIN 20000 UNITS EX SOLR
CUTANEOUS | Status: AC
  Filled 2016-01-27: qty 20000

## 2016-01-27 MED ORDER — BUPIVACAINE LIPOSOME 1.3 % IJ SUSP
20.0000 mL | INTRAMUSCULAR | Status: DC
  Filled 2016-01-27: qty 20

## 2016-01-27 MED ORDER — PROMETHAZINE HCL 25 MG/ML IJ SOLN: 6.2500 mg | INTRAMUSCULAR | Status: DC | PRN

## 2016-01-27 MED ORDER — HYDROMORPHONE HCL 1 MG/ML IJ SOLN
0.2500 mg | INTRAMUSCULAR | Status: DC | PRN
  Administered 2016-01-27: 0.5 mg via INTRAVENOUS

## 2016-01-27 MED ORDER — PROPOFOL 500 MG/50ML IV EMUL
INTRAVENOUS | Status: DC | PRN
  Administered 2016-01-27: 75 ug/kg/min via INTRAVENOUS

## 2016-01-27 MED ORDER — HYDROMORPHONE HCL 1 MG/ML IJ SOLN: 0.2500 mg | INTRAMUSCULAR | Status: DC | PRN

## 2016-01-27 MED ORDER — BUPIVACAINE LIPOSOME 1.3 % IJ SUSP
INTRAMUSCULAR | Status: DC | PRN
  Administered 2016-01-27 (×2): 20 mL

## 2016-01-27 MED ORDER — ARTIFICIAL TEARS OP OINT
TOPICAL_OINTMENT | OPHTHALMIC | Status: AC
  Filled 2016-01-27: qty 3.5

## 2016-01-27 MED ORDER — DOCUSATE SODIUM 100 MG PO CAPS: 100.0000 mg | ORAL_CAPSULE | Freq: Two times a day (BID) | ORAL | Status: DC

## 2016-01-27 MED ORDER — METHYLENE BLUE 0.5 % INJ SOLN
INTRAVENOUS | Status: AC
  Filled 2016-01-27: qty 10

## 2016-01-27 MED ORDER — PHENYLEPHRINE HCL 10 MG/ML IJ SOLN
INTRAMUSCULAR | Status: DC | PRN
  Administered 2016-01-27: 40 ug via INTRAVENOUS
  Administered 2016-01-27 (×3): 80 ug via INTRAVENOUS
  Administered 2016-01-27: 120 ug via INTRAVENOUS

## 2016-01-27 MED ORDER — HYDROMORPHONE HCL 1 MG/ML IJ SOLN
INTRAMUSCULAR | Status: AC
  Filled 2016-01-27: qty 0.5

## 2016-01-27 MED ORDER — FENTANYL CITRATE (PF) 100 MCG/2ML IJ SOLN
INTRAMUSCULAR | Status: DC | PRN
  Administered 2016-01-27: 300 ug via INTRAVENOUS
  Administered 2016-01-27: 100 ug via INTRAVENOUS

## 2016-01-27 SURGICAL SUPPLY — 84 items
BENZOIN TINCTURE PRP APPL 2/3 (GAUZE/BANDAGES/DRESSINGS) ×3 IMPLANT
BIT DRILL 3.2 (BIT) ×2
BIT DRILL 65X3.2XQC STP NS (BIT) ×1 IMPLANT
BIT DRL 65X3.2XQC STP NS (BIT) ×1
BLADE SURG ROTATE 9660 (MISCELLANEOUS) IMPLANT
BUR PRESCISION 1.7 ELITE (BURR) ×3 IMPLANT
BUR SABER RD CUTTING 3.0 (BURR) ×2 IMPLANT
BUR SABER RD CUTTING 3.0MM (BURR) ×1
CARTRIDGE OIL MAESTRO DRILL (MISCELLANEOUS) ×1 IMPLANT
CLOSURE WOUND 1/2 X4 (GAUZE/BANDAGES/DRESSINGS) ×1
CONT SPEC STER OR (MISCELLANEOUS) ×3 IMPLANT
COVER MAYO STAND STRL (DRAPES) ×6 IMPLANT
COVER SURGICAL LIGHT HANDLE (MISCELLANEOUS) ×3 IMPLANT
DIFFUSER DRILL AIR PNEUMATIC (MISCELLANEOUS) ×3 IMPLANT
DRAIN CHANNEL 15F RND FF W/TCR (WOUND CARE) IMPLANT
DRAPE C-ARM 42X72 X-RAY (DRAPES) ×3 IMPLANT
DRAPE C-ARMOR (DRAPES) IMPLANT
DRAPE POUCH INSTRU U-SHP 10X18 (DRAPES) ×3 IMPLANT
DRAPE SURG 17X23 STRL (DRAPES) ×12 IMPLANT
DURAPREP 26ML APPLICATOR (WOUND CARE) ×3 IMPLANT
ELECT BLADE 4.0 EZ CLEAN MEGAD (MISCELLANEOUS) ×3
ELECT CAUTERY BLADE 6.4 (BLADE) ×3 IMPLANT
ELECT REM PT RETURN 9FT ADLT (ELECTROSURGICAL) ×3
ELECTRODE BLDE 4.0 EZ CLN MEGD (MISCELLANEOUS) ×1 IMPLANT
ELECTRODE REM PT RTRN 9FT ADLT (ELECTROSURGICAL) ×1 IMPLANT
EVACUATOR SILICONE 100CC (DRAIN) IMPLANT
FEE INTRAOP MONITOR IMPULS NCS (MISCELLANEOUS) ×1 IMPLANT
GAUZE SPONGE 4X4 12PLY STRL (GAUZE/BANDAGES/DRESSINGS) ×3 IMPLANT
GAUZE SPONGE 4X4 16PLY XRAY LF (GAUZE/BANDAGES/DRESSINGS) ×3 IMPLANT
GLOVE BIO SURGEON STRL SZ7 (GLOVE) ×6 IMPLANT
GLOVE BIO SURGEON STRL SZ8 (GLOVE) ×6 IMPLANT
GLOVE BIOGEL PI IND STRL 7.0 (GLOVE) ×2 IMPLANT
GLOVE BIOGEL PI IND STRL 8 (GLOVE) ×2 IMPLANT
GLOVE BIOGEL PI INDICATOR 7.0 (GLOVE) ×4
GLOVE BIOGEL PI INDICATOR 8 (GLOVE) ×4
GOWN STRL REUS W/ TWL LRG LVL3 (GOWN DISPOSABLE) ×2 IMPLANT
GOWN STRL REUS W/ TWL XL LVL3 (GOWN DISPOSABLE) ×2 IMPLANT
GOWN STRL REUS W/TWL LRG LVL3 (GOWN DISPOSABLE) ×4
GOWN STRL REUS W/TWL XL LVL3 (GOWN DISPOSABLE) ×4
INTRAOP MONITOR FEE IMPULS NCS (MISCELLANEOUS) ×1
INTRAOP MONITOR FEE IMPULSE (MISCELLANEOUS) ×2
IV CATH 14GX2 1/4 (CATHETERS) ×3 IMPLANT
KIT BASIN OR (CUSTOM PROCEDURE TRAY) ×3 IMPLANT
KIT POSITION SURG JACKSON T1 (MISCELLANEOUS) ×3 IMPLANT
KIT ROOM TURNOVER OR (KITS) ×3 IMPLANT
MARKER SKIN DUAL TIP RULER LAB (MISCELLANEOUS) ×3 IMPLANT
NDL SAFETY ECLIPSE 18X1.5 (NEEDLE) ×1 IMPLANT
NEEDLE 22X1 1/2 (OR ONLY) (NEEDLE) ×3 IMPLANT
NEEDLE FILTER BLUNT 18X 1/2SAF (NEEDLE) ×4
NEEDLE FILTER BLUNT 18X1 1/2 (NEEDLE) ×2 IMPLANT
NEEDLE HYPO 18GX1.5 SHARP (NEEDLE) ×2
NEEDLE HYPO 25GX1X1/2 BEV (NEEDLE) ×3 IMPLANT
NEEDLE SPNL 18GX3.5 QUINCKE PK (NEEDLE) ×6 IMPLANT
NS IRRIG 1000ML POUR BTL (IV SOLUTION) ×3 IMPLANT
OIL CARTRIDGE MAESTRO DRILL (MISCELLANEOUS) ×3
PACK LAMINECTOMY ORTHO (CUSTOM PROCEDURE TRAY) ×3 IMPLANT
PACK UNIVERSAL I (CUSTOM PROCEDURE TRAY) ×3 IMPLANT
PAD ARMBOARD 7.5X6 YLW CONV (MISCELLANEOUS) ×6 IMPLANT
PATTIES SURGICAL .5 X1 (DISPOSABLE) ×3 IMPLANT
PATTIES SURGICAL .5X1.5 (GAUZE/BANDAGES/DRESSINGS) ×3 IMPLANT
PUTTY BONE DBX 2.5 MIS (Bone Implant) ×3 IMPLANT
ROD EXPEDIUM PER BENT 65MM (Rod) ×6 IMPLANT
SCREW SET SINGLE INNER (Screw) ×18 IMPLANT
SCREW VIPER CORT FIX 6X35 (Screw) ×6 IMPLANT
SPONGE INTESTINAL PEANUT (DISPOSABLE) ×3 IMPLANT
SPONGE SURGIFOAM ABS GEL 100 (HEMOSTASIS) ×3 IMPLANT
STRIP CLOSURE SKIN 1/2X4 (GAUZE/BANDAGES/DRESSINGS) ×2 IMPLANT
SURGIFLO W/THROMBIN 8M KIT (HEMOSTASIS) IMPLANT
SUT MNCRL AB 4-0 PS2 18 (SUTURE) ×3 IMPLANT
SUT VIC AB 0 CT1 18XCR BRD 8 (SUTURE) ×1 IMPLANT
SUT VIC AB 0 CT1 8-18 (SUTURE) ×2
SUT VIC AB 1 CT1 18XCR BRD 8 (SUTURE) ×2 IMPLANT
SUT VIC AB 1 CT1 8-18 (SUTURE) ×4
SUT VIC AB 2-0 CT2 18 VCP726D (SUTURE) ×3 IMPLANT
SYR 20CC LL (SYRINGE) ×3 IMPLANT
SYR BULB IRRIGATION 50ML (SYRINGE) ×3 IMPLANT
SYR CONTROL 10ML LL (SYRINGE) ×6 IMPLANT
SYR TB 1ML LUER SLIP (SYRINGE) ×3 IMPLANT
TAPE CLOTH SURG 4X10 WHT LF (GAUZE/BANDAGES/DRESSINGS) ×3 IMPLANT
TOWEL OR 17X24 6PK STRL BLUE (TOWEL DISPOSABLE) ×3 IMPLANT
TOWEL OR 17X26 10 PK STRL BLUE (TOWEL DISPOSABLE) ×3 IMPLANT
TRAY FOLEY CATH 16FRSI W/METER (SET/KITS/TRAYS/PACK) ×3 IMPLANT
WATER STERILE IRR 1000ML POUR (IV SOLUTION) IMPLANT
YANKAUER SUCT BULB TIP NO VENT (SUCTIONS) ×3 IMPLANT

## 2016-01-27 NOTE — Anesthesia Procedure Notes (Signed)
Procedure Name: Intubation Date/Time: 01/27/2016 7:45 AM Performed by: Mariea Clonts Pre-anesthesia Checklist: Patient identified, Emergency Drugs available, Suction available and Patient being monitored Patient Re-evaluated:Patient Re-evaluated prior to inductionOxygen Delivery Method: Circle System Utilized Preoxygenation: Pre-oxygenation with 100% oxygen Intubation Type: IV induction Ventilation: Mask ventilation without difficulty Laryngoscope Size: Glidescope Grade View: Grade I Tube type: Oral Number of attempts: 1 Airway Equipment and Method: Stylet,  Oral airway and Video-laryngoscopy Placement Confirmation: ETT inserted through vocal cords under direct vision,  positive ETCO2 and breath sounds checked- equal and bilateral Tube secured with: Tape Dental Injury: Teeth and Oropharynx as per pre-operative assessment

## 2016-01-27 NOTE — Progress Notes (Addendum)
Pharmacy Antibiotic Note  Walter White. is a 66 y.o. male admitted on 01/26/2016 for spinal surgery.  He received vancomycin 1500mg  yesterday around 0830 and 1000mg  at 2145.  Patient underwent part 2 of his surgery today.  He received vancomycin 1gm around 0800 and Pharmacy has been consulted for another dose post-op today.  Patient has good renal clearance.  He does not have a drain.   Plan: - Vanc 1g IV x 1 at [facility] will sign off.  Thank you for the consult!   Height: 5' 7.5" (171.5 cm) Weight: 242 lb (109.8 kg) IBW/kg (Calculated) : 67.25  Temp (24hrs), Avg:98.2 F (36.8 C), Min:97.3 F (36.3 C), Max:98.7 F (37.1 C)  No results for input(s): WBC, CREATININE, LATICACIDVEN, VANCOTROUGH, VANCOPEAK, VANCORANDOM, GENTTROUGH, GENTPEAK, GENTRANDOM, TOBRATROUGH, TOBRAPEAK, TOBRARND, AMIKACINPEAK, AMIKACINTROU, AMIKACIN in the last 168 hours.  Estimated Creatinine Clearance: 106.8 mL/min (by C-G formula based on SCr of 0.63 mg/dL).    Allergies  Allergen Reactions  . Lactose Intolerance (Gi)     Dairy--allergic per allergy test  . Penicillins     Per pt: unknown Has patient had a PCN reaction causing immediate rash, facial/tongue/throat swelling, SOB or lightheadedness with hypotension: Unknown Has patient had a PCN reaction causing severe rash involving mucus membranes or skin necrosis: Unknown Has patient had a PCN reaction that required hospitalization: Unknown Has patient had a PCN reaction occurring within the last 10 years: No If all of the above answers are "NO", then may proceed with Cephalosporin use.   . Pork-Derived Products Other (See Comments)    Per allergy test  . Shellfish Allergy Other (See Comments)    Per allergy test       Walter White, PharmD, BCPS Pager:  [phone] 01/27/2016, 12:10 PM

## 2016-01-27 NOTE — Anesthesia Postprocedure Evaluation (Addendum)
Anesthesia Post Note  Patient: Walter White.  Procedure(s) Performed: Procedure(s) (LRB): LUMBAR 3-4 POSTERIOR SPINAL FUSION WITH INSTRUMENTATION AND ALLOGRAFT (N/A)  Patient location during evaluation: PACU Anesthesia Type: General Level of consciousness: awake and alert Pain management: pain level controlled Vital Signs Assessment: post-procedure vital signs reviewed and stable Respiratory status: spontaneous breathing, nonlabored ventilation, respiratory function stable and patient connected to nasal cannula oxygen Cardiovascular status: blood pressure returned to baseline and stable Postop Assessment: no signs of nausea or vomiting Anesthetic complications: no       Last Vitals:  Vitals:   01/27/16 1142 01/27/16 1158  BP:  (!) 147/90  Pulse:  79  Resp:    Temp: 36.7 C 36.5 C    Last Pain:  Vitals:   01/27/16 1128  TempSrc:   PainSc: 0-No pain                 Nimrod Wendt,JAMES TERRILL

## 2016-01-27 NOTE — Op Note (Signed)
NAME:  Walter White, Walter White NO.:  [id number]  MEDICAL RECORD NO.:  [id number]  LOCATION:                                 FACILITY:  PHYSICIAN:  Phylliss Bob, MD      DATE OF BIRTH:  08/24/1950  DATE OF PROCEDURE:  01/26/2016                              OPERATIVE REPORT   PREOPERATIVE DIAGNOSES: 1. Adjacent segment disease, L3-4, status post a previous L4-5     decompression and fusion. 2. Bilateral neural foraminal stenosis, L3-4. 3. Bilateral leg pain.  POSTOPERATIVE DIAGNOSES: 1. Adjacent segment disease, L3-4, status post a previous L4-5     decompression and fusion. 2. Bilateral neural foraminal stenosis, L3-4. 3. Bilateral leg pain.  PROCEDURE (stage 1 of 2): 1. Anterior lumbar interbody fusion, L3-4, via a left-sided direct     lateral approach. 2. Insertion of interbody device x1 (NuVasive 14 x 22 x 55 mm     intervertebral spacer). 3. Use of morselized allograft-ViviGen. 4. Placement of anterior instrumentation, L3-4.  SURGEON:  Phylliss Bob, M.D.  ASSISTANT:  Pricilla Holm, PA-C.  ANESTHESIA:  General endotracheal anesthesia.  COMPLICATIONS:  None.  DISPOSITION:  Stable.  ESTIMATED BLOOD LOSS:  Minimal.  INDICATIONS FOR SURGERY:  Briefly, Walter White is a pleasant 66 year old male who is status post a previous L4-5 decompression and fusion.  He did very well from that surgery, but did have a recurrence of pain.  The patient's MRI did reveal neural foraminal stenosis at the level above. He did fail conservative care and we did discuss proceeding with the procedure reflected above.  The patient was fully aware of the risks and limitations of surgery.  Of particular note, the patient was to be brought for stage 1 of a 2 staged procedure.  The patient did understand that the second stage of the procedure would involve a fusion and potentially decompression, to occur on the following day.  OPERATIVE DETAILS:  On January 26, 2016, the patient  was brought to surgery and general endotracheal anesthesia was administered.  The patient was placed in the lateral decubitus position with the left side up.  Antibiotics were given.  The left flank was prepped and draped in the usual fashion.  The bed was appropriately positioned using intraoperative fluoroscopy.  The hips and knees were flexed.  After prepping and draping and performing a time-out procedure.  A left-sided transverse incision was made overlying the L3-4 intervertebral space. Dilators were sequentially placed through the psoas muscle over the L3-4 intervertebral space.  Neurologic monitoring did confirm that there were no neurologic structures in the immediate vicinity of the retractor.  I then sequentially dilated and a self-retaining retractor was placed.  I did probe the wound using neurologic monitoring, and there were no neurologic structures in the immediate vicinity of the exposure.  A standard L3-4 intervertebral diskectomy was performed.  I was very pleased with the diskectomy that I was able to accomplish.  The endplates were prepared.  Of note, I did release the contralateral anulus.  I then placed a series of intervertebral spacer trials.  The appropriate size interbody spacer was packed with ViviGen and tamped into position in the usual  fashion.  I was very pleased with the press- fit of the implant and I was extremely pleased with the restoration of height.  An anterior plate was placed over the anterior spine over its left lateral aspect.  A 4 hole plate was chosen.  Two screws were placed in each vertebral body at L3 and L4.  The screws were then locked to the plate.  A 45-mm screws were used.  The minimal amount of break that was in the bed was removed and then the plate was locked.  I was very pleased with the final AP and lateral fluoroscopic images.  The wound was then copiously irrigated.  The wound was then closed in layers using #1 Vicryl, followed  by 2-0 Vicryl, followed by 4-0 Monocryl.  Benzoin and Steri-Strips were applied, followed by sterile dressing.  All instrument counts were correct at the termination of the procedure.  Of note, Pricilla Holm, was my assistant throughout the surgery, and did aid in retraction, suctioning, and closure from start to finish.     Phylliss Bob, MD   ______________________________ Phylliss Bob, MD    MD/MEDQ  D:  01/26/2016  T:  01/27/2016  Job:  [id number]

## 2016-01-27 NOTE — H&P (Signed)
Patient presents for stage 2 of his 2-staged procedure. Patient reports resolution of his bilateral leg pain. Will proceed with his L3/4 PSF with instrumentation.Marland KitchenMarland Kitchen

## 2016-01-27 NOTE — Transfer of Care (Signed)
Immediate Anesthesia Transfer of Care Note  Patient: Walter White.  Procedure(s) Performed: Procedure(s) with comments: LUMBAR 3-4 POSTERIOR SPINAL FUSION WITH INSTRUMENTATION AND ALLOGRAFT (N/A) - LUMBAR 3-4 POSTERIOR SPINAL FUSION WITH INSTRUMENTATION AND ALLOGRAFT  Patient Location: PACU  Anesthesia Type:General  Level of Consciousness: awake, alert  and oriented  Airway & Oxygen Therapy: Patient Spontanous Breathing and Patient connected to nasal cannula oxygen  Post-op Assessment: Report given to RN, Post -op Vital signs reviewed and stable and Patient moving all extremities X 4  Post vital signs: Reviewed and stable  Last Vitals:  Vitals:   01/27/16 0120 01/27/16 0500  BP: 128/68 (!) 134/57  Pulse: 88 95  Resp: 16 16  Temp: 36.7 C 36.8 C    Last Pain:  Vitals:   01/27/16 0500  TempSrc: Oral  PainSc:       Patients Stated Pain Goal: 2 ([id number])  Complications: No apparent anesthesia complications

## 2016-01-27 NOTE — Care Management Note (Signed)
Case Management Note  Patient Details  Name: Phil Kozar. MRN: [id number] Date of Birth: June 19, 1950  Subjective/Objective:          Patient was admitted for a PLIF. Lives at home with spouse. CM will follow for discharge needs pending PT/OT evals and physician orders.           Action/Plan:   Expected Discharge Date:                  Expected Discharge Plan:     In-House Referral:     Discharge planning Services     Post Acute Care Choice:    Choice offered to:     DME Arranged:    DME Agency:     HH Arranged:    HH Agency:     Status of Service:     If discussed at [employer] of Stay Meetings, dates discussed:    Additional Comments:  Rolm Baptise, RN 01/27/2016, 4:09 PM

## 2016-01-27 NOTE — Anesthesia Preprocedure Evaluation (Addendum)
Anesthesia Evaluation  Patient identified by MRN, date of birth, ID band Patient awake    Reviewed: Allergy & Precautions, NPO status , Patient's Chart, lab work & pertinent test results  History of Anesthesia Complications (+) DIFFICULT AIRWAY  Airway Mallampati: II  TM Distance: >3 FB Neck ROM: Full    Dental  (+) Teeth Intact, Dental Advisory Given   Pulmonary former smoker,    breath sounds clear to auscultation       Cardiovascular hypertension,  Rhythm:Regular Rate:Normal     Neuro/Psych  Neuromuscular disease    GI/Hepatic GERD  ,  Endo/Other    Renal/GU      Musculoskeletal  (+) Arthritis ,   Abdominal (+) + obese,   Peds  Hematology   Anesthesia Other Findings   Reproductive/Obstetrics                            Anesthesia Physical Anesthesia Plan  ASA: II  Anesthesia Plan: General   Post-op Pain Management:    Induction: Intravenous  Airway Management Planned: Oral ETT and Video Laryngoscope Planned  Additional Equipment:   Intra-op Plan:   Post-operative Plan: Extubation in OR  Informed Consent: I have reviewed the patients History and Physical, chart, labs and discussed the procedure including the risks, benefits and alternatives for the proposed anesthesia with the patient or authorized representative who has indicated his/her understanding and acceptance.   Dental advisory given  Plan Discussed with:   Anesthesia Plan Comments:         Anesthesia Quick Evaluation

## 2016-01-28 MED FILL — Sodium Chloride IV Soln 0.9%: INTRAVENOUS | Qty: 1000 | Status: AC

## 2016-01-28 MED FILL — Heparin Sodium (Porcine) Inj 1000 Unit/ML: INTRAMUSCULAR | Qty: 30 | Status: AC

## 2016-01-28 NOTE — Progress Notes (Signed)
Patient is discharged from room 5C12 at this time. Alert and in stable condition. IV site d/c'd and instructions read to patient with understanding verbalized. Left unit via wheelchair with family and all belongings at side.

## 2016-01-28 NOTE — Evaluation (Signed)
Physical Therapy Evaluation Patient Details Name: Walter White. MRN: [id number] DOB: [birth year] Today's Date: 01/28/2016   History of Present Illness  Pt is a 66 y/o male s/p LUMBAR 3-4 POSTERIOR SPINAL FUSION   Clinical Impression  Pt presents as independent with all gait and mobility and stairs with a railing. Pt aware of back precautions from previous back surgery. Pt with no further PT needs at this time.    Follow Up Recommendations No PT follow up    Equipment Recommendations  None recommended by PT    Recommendations for Other Services       Precautions / Restrictions Precautions Precautions: Back Required Braces or Orthoses: Spinal Brace Spinal Brace: Thoracolumbosacral orthotic;Applied in sitting position Restrictions Weight Bearing Restrictions: No      Mobility  Bed Mobility                  Transfers Overall transfer level: Independent                  Ambulation/Gait Ambulation/Gait assistance: Independent   Assistive device: None       General Gait Details: wide BOS, no LOB during gait  Stairs Stairs: Yes Stairs assistance: Modified independent (Device/Increase time) Stair Management: One rail Left Number of Stairs: 5    Wheelchair Mobility    Modified Rankin (Stroke Patients Only)       Balance Overall balance assessment: Independent                                           Pertinent Vitals/Pain Pain Assessment: No/denies pain    Home Living Family/patient expects to be discharged to:: Private residence Living Arrangements: Spouse/significant other Available Help at Discharge: Family;Available 24 hours/day Type of Home: House Home Access: Stairs to enter Entrance Stairs-Rails: Can reach both Entrance Stairs-Number of Steps: 4 Home Layout: One level Home Equipment: Environmental consultant - 2 wheels      Prior Function Level of Independence: Independent               Hand Dominance         Extremity/Trunk Assessment   Upper Extremity Assessment Upper Extremity Assessment: Overall WFL for tasks assessed    Lower Extremity Assessment Lower Extremity Assessment: Overall WFL for tasks assessed    Cervical / Trunk Assessment Cervical / Trunk Assessment:  (spinal brace)  Communication   Communication: No difficulties  Cognition Arousal/Alertness: Awake/alert Behavior During Therapy: WFL for tasks assessed/performed Overall Cognitive Status: Within Functional Limits for tasks assessed                      General Comments      Exercises     Assessment/Plan    PT Assessment Patent does not need any further PT services  PT Problem List            PT Treatment Interventions      PT Goals (Current goals can be found in the Care Plan section)  Acute Rehab PT Goals PT Goal Formulation: All assessment and education complete, DC therapy    Frequency     Barriers to discharge        Co-evaluation               End of Session Equipment Utilized During Treatment: Back brace Activity Tolerance: Patient tolerated treatment well Patient left: in chair;with  call bell/phone within reach Nurse Communication: Mobility status         Time: [id number] PT Time Calculation (min) (ACUTE ONLY): 16 min   Charges:   PT Evaluation $PT Eval Low Complexity: 1 Procedure     PT G Codes:        Eleonor Ocon 02-27-16, 9:08 AM

## 2016-01-28 NOTE — Care Management Note (Signed)
Case Management Note  Patient Details  Name: Walter White. MRN: [id number] Date of Birth: April 29, 1950  Subjective/Objective:                    Action/Plan: Pt discharging home with self care. Pt has transportation home. No f/u per PT/OT and no DME needs. No further needs per CM.   Expected Discharge Date:  01/28/16               Expected Discharge Plan:  Home/Self Care  In-House Referral:     Discharge planning Services     Post Acute Care Choice:    Choice offered to:     DME Arranged:    DME Agency:     HH Arranged:    HH Agency:     Status of Service:  Completed, signed off  If discussed at [employer] of Stay Meetings, dates discussed:    Additional Comments:  Pollie Friar, RN 01/28/2016, 10:52 AM

## 2016-01-28 NOTE — Op Note (Signed)
NAME:  Walter White, Walter White NO.:  [id number]  MEDICAL RECORD NO.:  [id number]  LOCATION:                                 FACILITY:  PHYSICIAN:  Phylliss Bob, MD      DATE OF BIRTH:  12/21/1950  DATE OF PROCEDURE:  01/27/2016                              OPERATIVE REPORT   PREOPERATIVE DIAGNOSES:  L3-4 spinal stenosis, status post previous L4-5 fusion.  The patient is status post anterior lumbar fusion with instrumentation on January 26, 2016.  The patient did present to the operating room today for stage II of what was to be a 2-staged procedure.  Please refer to the operative report dated January 27, 2016, for additional details.  POSTOPERATIVE DIAGNOSIS:  L3-4 spinal stenosis, status post previous L4- 5 fusion.  The patient is status post anterior lumbar fusion with instrumentation on January 26, 2016.  The patient did present to the operating room today for stage II of what was to be a 2-staged procedure.  Please refer to the operative report dated January 27, 2016, for additional details.  PROCEDURE: 1. L3-4 posterior spinal fusion. 2. Placement of posterior segmental instrumentation, extended up to     the L3 level, involving L3, L4, and L5. 3. L3-4 decompression with bilateral partial facetectomy. 4. Use of local autograft. 5. Use of morselized allograft - DBX mix. 6. Intraoperative use of fluoroscopy.  SURGEON:  Phylliss Bob, M.D.  ASSISTANT:  Pricilla Holm, PA-C.  ANESTHESIA:  General endotracheal anesthesia.  COMPLICATIONS:  None.  DISPOSITION:  Stable.  ESTIMATED BLOOD LOSS:  Minimal.  INDICATIONS FOR SURGERY:  Please see above.  OPERATIVE DETAILS:  On January 27, 2016, the patient was brought to surgery and general endotracheal anesthesia was administered.  The patient was placed prone on a well-padded flat Jackson bed with a spinal frame.  Antibiotics were given and a time-out procedure was performed. The patient's previous incision  was re-utilized.  The fascia was incised at the midline.  The previously placed L4 and L5 instrumentation was readily identified.  I then removed the caps at L4 and at L5 bilaterally.  I did also remove the interconnecting rods.  I then thoroughly exposed the L3-4 facet joint and L3 lamina bilaterally. Using anatomic landmarks in addition to AP and lateral fluoroscopy, I did cannulate the L3 pedicles, using a medial to lateral cortical trajectory technique.  Bone wax was placed in the cannulated pedicle holes.  With the L3 lamina and the L3-4 facet joint thoroughly exposed, I did proceed with the decompression, removing the medial aspect of the L3-4 facet joint, using a series of Kerrison punches in addition to a 3 mm high-speed bur.  This was done in order to additionally decompress the spinal canal.  I then decorticated the posterolateral gutter and the L3-4 facet joint using a high-speed bur as well.  The wound was copiously irrigated throughout the surgery with approximately 2 L of normal saline.  At this point, I did liberally pack DBX mix into the region of the bilateral L3-4 facet joints and posterolateral gutters to help aid in the posterior fusion.  6 x 35 mm screws were then placed at  L3 bilaterally, in order to extend the segmental instrumentation from L4- 5, up to the L3-4 level.  65 mm rods were then placed bilaterally.  Caps were then placed over the top of the L3, L4, and L5 pedicle screws.  I was very pleased with the final AP and lateral fluoroscopic images. There was no abnormal EMG activity noted throughout the entire surgery. Small bleeding was controlled.  There was no extravasation of cerebrospinal fluid.  The wound was then closed in layers using #1 Vicryl, followed by 2-0 Vicryl, followed by 4-0 Monocryl.  Benzoin and Steri-Strips were applied followed by sterile dressing.  All instrument counts were correct at the termination of the procedure.  Of note, Pricilla Holm was my assistant throughout surgery, and did aid in retraction, suctioning, and closure from start to finish.     Phylliss Bob, MD   ______________________________ Phylliss Bob, MD    MD/MEDQ  D:  01/27/2016  T:  01/28/2016  Job:  [id number]

## 2016-01-28 NOTE — Progress Notes (Signed)
Occupational Therapy Evaluation Patient Details Name: Walter White. MRN: [id number] DOB: Feb 25, 1950 Today's Date: 01/28/2016    History of Present Illness Pt is a 66 y/o male s/p LUMBAR 3-4 POSTERIOR SPINAL FUSION    Clinical Impression   Makjng excellent progress. Completed all education and functional mobility for ADL. Pt safe to DC home when medically stable. OT signing off. Pt safe to walk independently with TLSO on.     Follow Up Recommendations  No OT follow up;Supervision - Intermittent    Equipment Recommendations  None recommended by OT    Recommendations for Other Services       Precautions / Restrictions Precautions Precautions: Back Precaution Booklet Issued: Yes (comment) Precaution Comments: reviewed precautions Required Braces or Orthoses: Spinal Brace Spinal Brace: Thoracolumbosacral orthotic;Applied in sitting position;Applied in standing position Restrictions Weight Bearing Restrictions: No      Mobility Bed Mobility               General bed mobility comments: OOB in chair  Transfers Overall transfer level: Modified independent                    Balance Overall balance assessment: No apparent balance deficits (not formally assessed)                                          ADL Overall ADL's : Needs assistance/impaired                                     Functional mobility during ADLs: Modified independent General ADL Comments: Completed education regarding comepnsatory technqiues and use of AE for ADL. Pt verbalized understanding. Reviewed precautions and use of TLSO when OOB or walking x 6 weeks per MD order.  Pt staaes his wife will assist as needed. 1     Vision     Perception     Praxis      Pertinent Vitals/Pain Pain Assessment: 0-10 Pain Score: 2  Pain Location: L knee     Hand Dominance Right   Extremity/Trunk Assessment Upper Extremity Assessment Upper Extremity  Assessment: Overall WFL for tasks assessed   Lower Extremity Assessment Lower Extremity Assessment: Defer to PT evaluation   Cervical / Trunk Assessment Cervical / Trunk Assessment: Other exceptions (back surgery)   Communication Communication Communication: No difficulties   Cognition Arousal/Alertness: Awake/alert Behavior During Therapy: WFL for tasks assessed/performed Overall Cognitive Status: Within Functional Limits for tasks assessed                     General Comments       Exercises       Shoulder Instructions      Home Living Family/patient expects to be discharged to:: Private residence Living Arrangements: Spouse/significant other Available Help at Discharge: Family;Available 24 hours/day Type of Home: House Home Access: Stairs to enter [employer] of Steps: 4 Entrance Stairs-Rails: Can reach both Home Layout: One level     Bathroom Shower/Tub: Occupational psychologist: Standard Bathroom Accessibility: Yes How Accessible: Accessible via walker Home Equipment: Lockhart - 2 wheels;Shower seat - built in;Hand held shower head          Prior Functioning/Environment Level of Independence: Independent  OT Problem List: Decreased activity tolerance;Decreased range of motion;Decreased knowledge of use of DME or AE;Decreased knowledge of precautions;Obesity;Pain   OT Treatment/Interventions:      OT Goals(Current goals can be found in the care plan section) Acute Rehab OT Goals Patient Stated Goal: to go home OT Goal Formulation: All assessment and education complete, DC therapy  OT Frequency:     Barriers to D/C:            Co-evaluation              End of Session Equipment Utilized During Treatment: Back brace Nurse Communication: Mobility status;Other (comment) (pt safe to ambulate independently)  Activity Tolerance: Patient tolerated treatment well Patient left: in chair;with call  bell/phone within reach   Time: 0950-1008 OT Time Calculation (min): 18 min Charges:  OT General Charges $OT Visit: 1 Procedure OT Evaluation $OT Eval Low Complexity: 1 Procedure G-Codes:    Donnette Macmullen,HILLARY 2016/02/01, 10:15 AM   Maurie Boettcher, OT/L  [phone] Feb 01, 2016

## 2016-02-01 ENCOUNTER — Encounter (HOSPITAL_COMMUNITY): Payer: Self-pay | Admitting: Orthopedic Surgery

## 2016-02-03 NOTE — Discharge Summary (Signed)
Patient ID: Walter White. MRN: [id number] DOB/AGE: 1951-01-05 66 y.o.  Admit date: 01/26/2016 Discharge date:  01/28/2016  Admission Diagnoses:  Active Problems:   Radiculopathy   Discharge Diagnoses:  Same  Past Medical History:  Diagnosis Date  . Arthritis    spinal steniosis  . At risk for difficult intubation    no trouble intubating as far as pt knows- swelling afterward -above note previous  . Benign prostate hyperplasia   . 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 GERD (gastroesophageal reflux disease)   . Hypertension    "recently put on rx"  . Pneumonia    Hx -when very young  . Seasonal allergies   . Spinal stenosis     Surgeries: Procedure(s): Lumbar 3-4 Lateral decompression and Fusion, and  LUMBAR 3-4, L4-5 POSTERIOR SPINAL FUSION WITH INSTRUMENTATION AND ALLOGRAFT on 01/26/2016 - 01/27/2016   Consultants: None  Discharged Condition: Improved  Hospital Course: Kinnick Sellen. is an 66 y.o. male who was admitted 01/26/2016 for operative treatment of spinal Stenosis. Patient has severe unremitting pain that affects sleep, daily activities, and work/hobbies. After pre-op clearance the patient was taken to the operating room on 01/26/2016 - 01/27/2016 and underwent  Procedure(s): Lumbar 3-4 Lateral decompression and Fusion and LUMBAR 3-4, 4-5 POSTERIOR SPINAL FUSION WITH INSTRUMENTATION AND ALLOGRAFT.    Patient was given perioperative antibiotics:  Anti-infectives    Start     Dose/Rate Route Frequency Ordered Stop   01/27/16 2000  vancomycin (VANCOCIN) IVPB 1000 mg/200 mL premix     1,000 mg 200 mL/hr over 60 Minutes Intravenous  Once 01/27/16 1214 01/27/16 2114   01/27/16 1000  vancomycin (VANCOCIN) 1,000 mg in sodium chloride 0.9 % 250 mL IVPB  Status:  Discontinued     1,000 mg 250 mL/hr over 60 Minutes Intravenous Every 12 hours 01/27/16 0757  01/27/16 1205   01/27/16 0730  vancomycin (VANCOCIN) 1-5 GM/200ML-% IVPB  Status:  Discontinued    Comments:  Surveyor, quantity, Zachary   : cabinet override      01/27/16 0730 01/27/16 1214   01/26/16 2030  vancomycin (VANCOCIN) IVPB 1000 mg/200 mL premix     1,000 mg 200 mL/hr over 60 Minutes Intravenous  Once 01/26/16 1423 01/26/16 2245   01/26/16 0703  vancomycin (VANCOCIN) IVPB 1000 mg/200 mL premix  Status:  Discontinued     1,000 mg 200 mL/hr over 60 Minutes Intravenous On call to O.R. 01/26/16 0703 01/26/16 0705   01/26/16 0600  vancomycin (VANCOCIN) 1,500 mg in sodium chloride 0.9 % 500 mL IVPB     1,500 mg 250 mL/hr over 120 Minutes Intravenous On call to O.R. 01/25/16 1341 01/26/16 1030       Patient was given sequential compression devices, early ambulation to prevent DVT.  Patient benefited maximally from hospital stay and there were no complications.    Recent vital signs: BP (!) 121/59 (BP Location: Left Arm)   Pulse 80   Temp 98.7 F (37.1 C) (Oral)   Resp 18   Ht 5' 7.5" (1.715 m)   Wt 109.8 kg (242 lb)   SpO2 95%   BMI 37.34 kg/m   Discharge Medications:   Allergies as of 01/28/2016      Reactions   Lactose Intolerance (gi)    Dairy--allergic per allergy test   Penicillins    Per pt: unknown Has  patient had a PCN reaction causing immediate rash, facial/tongue/throat swelling, SOB or lightheadedness with hypotension: Unknown Has patient had a PCN reaction causing severe rash involving mucus membranes or skin necrosis: Unknown Has patient had a PCN reaction that required hospitalization: Unknown Has patient had a PCN reaction occurring within the last 10 years: No If all of the above answers are "NO", then may proceed with Cephalosporin use.   Pork-derived Products Other (See Comments)   Per allergy test   Shellfish Allergy Other (See Comments)   Per allergy test       Medication List    STOP taking these medications   aspirin EC 81 MG tablet     HYDROcodone-acetaminophen 5-325 MG tablet Commonly known as:  NORCO/VICODIN   traMADol 50 MG tablet Commonly known as:  ULTRAM     TAKE these medications   docusate sodium 50 MG capsule Commonly known as:  COLACE Take 50 mg by mouth daily.   fluticasone 50 MCG/ACT nasal spray Commonly known as:  FLONASE Place 1 spray into both nostrils daily as needed. For allergies   gabapentin 300 MG capsule Commonly known as:  NEURONTIN Take 1 capsule (300 mg total) by mouth 3 (three) times daily. What changed:  when to take this   JUICE PLUS FIBRE PO Take 6 capsules by mouth daily. Juice Plus+ Orchard Blend 2 capsules in the morning Juice Plus+ Garden Blend 2 capsules in the morning Juice Plus+ Vineyard Blend 2 capsule in the morning   lisinopril 10 MG tablet Commonly known as:  PRINIVIL,ZESTRIL Take 10 mg by mouth at bedtime.   tamsulosin 0.4 MG Caps capsule Commonly known as:  FLOMAX Take 0.4 mg by mouth at bedtime.       Diagnostic Studies: Dg Chest 2 View  Result Date: 01/19/2016 CLINICAL DATA:  Preop for lumbar surgery. EXAM: CHEST  2 VIEW COMPARISON:  Radiographs of September 10, 2014. FINDINGS: The heart size and mediastinal contours are within normal limits. Both lungs are clear. The visualized skeletal structures are unremarkable. IMPRESSION: No active cardiopulmonary disease. Electronically Signed   By: Marijo Conception, M.D.   On: 01/19/2016 10:39   Dg Lumbar Spine 2-3 Views  Result Date: 01/27/2016 CLINICAL DATA:  L3-4 posterior fusion. EXAM: DG C-ARM 61-120 MIN; LUMBAR SPINE - 2-3 VIEW COMPARISON:  Intra procedural radiography from earlier today. FINDINGS: Spinal numbering as described on previous exams. Status post extension of posterior rod and pedicle screw hardware across the L3-4 interspace. Pre-existing left lateral fixation plate at the same level. Unremarkable screw positioning. No evidence of intraoperative fracture. IMPRESSION: Extension of posterior fusion to  include L3-4. Electronically Signed   By: Monte Fantasia M.D.   On: 01/27/2016 10:33   Dg Lumbar Spine 2-3 Views  Result Date: 01/26/2016 CLINICAL DATA:  Left L3-4 XLIF. EXAM: LUMBAR SPINE - 2-3 VIEW; DG C-ARM 61-120 MIN fluoroscopic time is 2 minutes and 30 seconds. COMPARISON:  January 20, 2015 FINDINGS: There is XLIF of the left side L3-4 without malalignment. IMPRESSION: XLIF of left L3-4 Electronically Signed   By: Abelardo Diesel M.D.   On: 01/26/2016 11:44   Dg Lumbar Spine 1 View  Result Date: 01/27/2016 CLINICAL DATA:  L4-5 posterior fusion. EXAM: LUMBAR SPINE - 1 VIEW COMPARISON:  01/26/2016 FINDINGS: Pedicle screws and rods are identified at L4-5. There is solid fusion of the L4-5 disc space. The left-sided XLIF at L3-4 with interbody cage material is stable. Surgical probes are identified posterior to L2 and L3.  IMPRESSION: 1. Fixation hardware at L3-4 and L4-5 is again identified. Surgical probes are posterior to L2 and L3. Electronically Signed   By: Kerby Moors M.D.   On: 01/27/2016 10:37   Dg C-arm 1-60 Min  Result Date: 01/27/2016 CLINICAL DATA:  L3-4 posterior fusion. EXAM: DG C-ARM 61-120 MIN; LUMBAR SPINE - 2-3 VIEW COMPARISON:  Intra procedural radiography from earlier today. FINDINGS: Spinal numbering as described on previous exams. Status post extension of posterior rod and pedicle screw hardware across the L3-4 interspace. Pre-existing left lateral fixation plate at the same level. Unremarkable screw positioning. No evidence of intraoperative fracture. IMPRESSION: Extension of posterior fusion to include L3-4. Electronically Signed   By: Monte Fantasia M.D.   On: 01/27/2016 10:33   Dg C-arm 61-120 Min  Result Date: 01/26/2016 CLINICAL DATA:  Left L3-4 XLIF. EXAM: LUMBAR SPINE - 2-3 VIEW; DG C-ARM 61-120 MIN fluoroscopic time is 2 minutes and 30 seconds. COMPARISON:  January 20, 2015 FINDINGS: There is XLIF of the left side L3-4 without malalignment. IMPRESSION: XLIF of left  L3-4 Electronically Signed   By: Abelardo Diesel M.D.   On: 01/26/2016 11:44    Disposition: 01-Home or Self Care  Discharge Instructions    Discharge patient    Complete by:  As directed    Likely after lunch after being seen, cleared by PT  D/C Instructions printed and placed in paper chart D/C scripts signed and placed in paper chart Back precautions at all times F/U in office 2 weeks   Discharge disposition:  01-Home or Self Care   Discharge patient date:  01/28/2016    PO Day 2/1 S/P Staged Lateral L3-4 decompression and Posterior  L3-5 Fusion   - Doing wonderful with resolved pre-op pain and minimal PO LBP -Written scripts for pain signed and in chart -D/C instructions sheet printed and in chart -D/C today  -F/U in office 2 weeks   Signed: Justice Britain 02/03/2016, 11:18 AM

## 2016-02-09 DIAGNOSIS — Z9889 Other specified postprocedural states: Secondary | ICD-10-CM | POA: Diagnosis not present

## 2016-02-24 DIAGNOSIS — M25562 Pain in left knee: Secondary | ICD-10-CM | POA: Diagnosis not present

## 2016-03-07 DIAGNOSIS — M545 Low back pain: Secondary | ICD-10-CM | POA: Diagnosis not present

## 2016-03-22 DIAGNOSIS — M25562 Pain in left knee: Secondary | ICD-10-CM | POA: Diagnosis not present

## 2016-04-18 DIAGNOSIS — M48062 Spinal stenosis, lumbar region with neurogenic claudication: Secondary | ICD-10-CM | POA: Diagnosis not present

## 2016-04-20 DIAGNOSIS — M545 Low back pain: Secondary | ICD-10-CM | POA: Diagnosis not present

## 2016-04-20 DIAGNOSIS — M4326 Fusion of spine, lumbar region: Secondary | ICD-10-CM | POA: Diagnosis not present

## 2016-04-25 DIAGNOSIS — M545 Low back pain: Secondary | ICD-10-CM | POA: Diagnosis not present

## 2016-04-25 DIAGNOSIS — M4326 Fusion of spine, lumbar region: Secondary | ICD-10-CM | POA: Diagnosis not present

## 2016-05-01 DIAGNOSIS — M4326 Fusion of spine, lumbar region: Secondary | ICD-10-CM | POA: Diagnosis not present

## 2016-05-01 DIAGNOSIS — M545 Low back pain: Secondary | ICD-10-CM | POA: Diagnosis not present

## 2016-05-03 DIAGNOSIS — M4326 Fusion of spine, lumbar region: Secondary | ICD-10-CM | POA: Diagnosis not present

## 2016-05-03 DIAGNOSIS — M545 Low back pain: Secondary | ICD-10-CM | POA: Diagnosis not present

## 2016-05-08 DIAGNOSIS — M4326 Fusion of spine, lumbar region: Secondary | ICD-10-CM | POA: Diagnosis not present

## 2016-05-08 DIAGNOSIS — M545 Low back pain: Secondary | ICD-10-CM | POA: Diagnosis not present

## 2016-05-09 DIAGNOSIS — M25562 Pain in left knee: Secondary | ICD-10-CM | POA: Diagnosis not present

## 2016-05-09 DIAGNOSIS — M1711 Unilateral primary osteoarthritis, right knee: Secondary | ICD-10-CM | POA: Diagnosis not present

## 2016-05-11 ENCOUNTER — Other Ambulatory Visit (HOSPITAL_COMMUNITY): Payer: Self-pay | Admitting: Orthopedic Surgery

## 2016-05-11 DIAGNOSIS — M4326 Fusion of spine, lumbar region: Secondary | ICD-10-CM | POA: Diagnosis not present

## 2016-05-11 DIAGNOSIS — M25562 Pain in left knee: Secondary | ICD-10-CM

## 2016-05-11 DIAGNOSIS — M545 Low back pain: Secondary | ICD-10-CM | POA: Diagnosis not present

## 2016-05-15 DIAGNOSIS — M4326 Fusion of spine, lumbar region: Secondary | ICD-10-CM | POA: Diagnosis not present

## 2016-05-15 DIAGNOSIS — M545 Low back pain: Secondary | ICD-10-CM | POA: Diagnosis not present

## 2016-05-16 DIAGNOSIS — M1711 Unilateral primary osteoarthritis, right knee: Secondary | ICD-10-CM | POA: Diagnosis not present

## 2016-05-18 DIAGNOSIS — M545 Low back pain: Secondary | ICD-10-CM | POA: Diagnosis not present

## 2016-05-18 DIAGNOSIS — M4326 Fusion of spine, lumbar region: Secondary | ICD-10-CM | POA: Diagnosis not present

## 2016-05-22 ENCOUNTER — Encounter (HOSPITAL_COMMUNITY)
Admission: RE | Admit: 2016-05-22 | Discharge: 2016-05-22 | Disposition: A | Discharge status: Home / Self Care | Payer: Medicare Other | Source: Ambulatory Visit | Attending: Orthopedic Surgery | Admitting: Orthopedic Surgery

## 2016-05-22 DIAGNOSIS — M25562 Pain in left knee: Secondary | ICD-10-CM | POA: Diagnosis not present

## 2016-05-22 MED ORDER — TECHNETIUM TC 99M MEDRONATE IV KIT
22.0000 | PACK | Freq: Once | INTRAVENOUS | Status: AC | PRN
  Administered 2016-05-22: 22 via INTRAVENOUS

## 2016-05-23 DIAGNOSIS — M1711 Unilateral primary osteoarthritis, right knee: Secondary | ICD-10-CM | POA: Diagnosis not present

## 2016-05-23 DIAGNOSIS — M25562 Pain in left knee: Secondary | ICD-10-CM | POA: Diagnosis not present

## 2016-05-25 DIAGNOSIS — M4326 Fusion of spine, lumbar region: Secondary | ICD-10-CM | POA: Diagnosis not present

## 2016-05-25 DIAGNOSIS — M545 Low back pain: Secondary | ICD-10-CM | POA: Diagnosis not present

## 2016-06-07 DIAGNOSIS — M545 Low back pain: Secondary | ICD-10-CM | POA: Diagnosis not present

## 2016-06-07 DIAGNOSIS — M4326 Fusion of spine, lumbar region: Secondary | ICD-10-CM | POA: Diagnosis not present

## 2016-06-09 NOTE — Addendum Note (Signed)
Addendum  created 06/09/16 [id number] by Rica Koyanagi, MD   Sign clinical note

## 2016-06-12 DIAGNOSIS — M4326 Fusion of spine, lumbar region: Secondary | ICD-10-CM | POA: Diagnosis not present

## 2016-06-12 DIAGNOSIS — M545 Low back pain: Secondary | ICD-10-CM | POA: Diagnosis not present

## 2016-06-13 DIAGNOSIS — M25562 Pain in left knee: Secondary | ICD-10-CM | POA: Diagnosis not present

## 2016-06-15 DIAGNOSIS — M545 Low back pain: Secondary | ICD-10-CM | POA: Diagnosis not present

## 2016-06-15 DIAGNOSIS — M4326 Fusion of spine, lumbar region: Secondary | ICD-10-CM | POA: Diagnosis not present

## 2016-06-20 DIAGNOSIS — M4326 Fusion of spine, lumbar region: Secondary | ICD-10-CM | POA: Diagnosis not present

## 2016-06-20 DIAGNOSIS — M545 Low back pain: Secondary | ICD-10-CM | POA: Diagnosis not present

## 2016-06-22 DIAGNOSIS — M545 Low back pain: Secondary | ICD-10-CM | POA: Diagnosis not present

## 2016-06-22 DIAGNOSIS — M4326 Fusion of spine, lumbar region: Secondary | ICD-10-CM | POA: Diagnosis not present

## 2016-06-26 DIAGNOSIS — H2513 Age-related nuclear cataract, bilateral: Secondary | ICD-10-CM | POA: Diagnosis not present

## 2016-06-26 DIAGNOSIS — H11153 Pinguecula, bilateral: Secondary | ICD-10-CM | POA: Diagnosis not present

## 2016-07-04 DIAGNOSIS — M25562 Pain in left knee: Secondary | ICD-10-CM | POA: Diagnosis not present

## 2016-07-13 DIAGNOSIS — M47816 Spondylosis without myelopathy or radiculopathy, lumbar region: Secondary | ICD-10-CM | POA: Diagnosis not present

## 2016-07-24 DIAGNOSIS — M4696 Unspecified inflammatory spondylopathy, lumbar region: Secondary | ICD-10-CM | POA: Diagnosis not present

## 2016-07-28 DIAGNOSIS — M24662 Ankylosis, left knee: Secondary | ICD-10-CM | POA: Diagnosis not present

## 2016-07-28 DIAGNOSIS — M238X2 Other internal derangements of left knee: Secondary | ICD-10-CM | POA: Diagnosis not present

## 2016-07-28 DIAGNOSIS — Z96652 Presence of left artificial knee joint: Secondary | ICD-10-CM | POA: Diagnosis not present

## 2016-08-03 DIAGNOSIS — M25562 Pain in left knee: Secondary | ICD-10-CM | POA: Diagnosis not present

## 2016-08-03 DIAGNOSIS — M25662 Stiffness of left knee, not elsewhere classified: Secondary | ICD-10-CM | POA: Diagnosis not present

## 2016-08-03 DIAGNOSIS — Z96652 Presence of left artificial knee joint: Secondary | ICD-10-CM | POA: Diagnosis not present

## 2016-08-03 DIAGNOSIS — Z9889 Other specified postprocedural states: Secondary | ICD-10-CM | POA: Diagnosis not present

## 2016-08-07 DIAGNOSIS — M25562 Pain in left knee: Secondary | ICD-10-CM | POA: Diagnosis not present

## 2016-08-07 DIAGNOSIS — Z96652 Presence of left artificial knee joint: Secondary | ICD-10-CM | POA: Diagnosis not present

## 2016-08-07 DIAGNOSIS — M25662 Stiffness of left knee, not elsewhere classified: Secondary | ICD-10-CM | POA: Diagnosis not present

## 2016-08-09 DIAGNOSIS — M25662 Stiffness of left knee, not elsewhere classified: Secondary | ICD-10-CM | POA: Diagnosis not present

## 2016-08-09 DIAGNOSIS — M25562 Pain in left knee: Secondary | ICD-10-CM | POA: Diagnosis not present

## 2016-08-09 DIAGNOSIS — Z96652 Presence of left artificial knee joint: Secondary | ICD-10-CM | POA: Diagnosis not present

## 2016-08-14 DIAGNOSIS — Z96652 Presence of left artificial knee joint: Secondary | ICD-10-CM | POA: Diagnosis not present

## 2016-08-14 DIAGNOSIS — M25662 Stiffness of left knee, not elsewhere classified: Secondary | ICD-10-CM | POA: Diagnosis not present

## 2016-08-14 DIAGNOSIS — M25562 Pain in left knee: Secondary | ICD-10-CM | POA: Diagnosis not present

## 2016-08-15 DIAGNOSIS — M47816 Spondylosis without myelopathy or radiculopathy, lumbar region: Secondary | ICD-10-CM | POA: Diagnosis not present

## 2016-08-25 DIAGNOSIS — M25562 Pain in left knee: Secondary | ICD-10-CM | POA: Diagnosis not present

## 2016-08-25 DIAGNOSIS — Z96652 Presence of left artificial knee joint: Secondary | ICD-10-CM | POA: Diagnosis not present

## 2016-08-25 DIAGNOSIS — M25662 Stiffness of left knee, not elsewhere classified: Secondary | ICD-10-CM | POA: Diagnosis not present

## 2016-08-28 DIAGNOSIS — M25562 Pain in left knee: Secondary | ICD-10-CM | POA: Diagnosis not present

## 2016-08-28 DIAGNOSIS — M25662 Stiffness of left knee, not elsewhere classified: Secondary | ICD-10-CM | POA: Diagnosis not present

## 2016-08-28 DIAGNOSIS — Z96652 Presence of left artificial knee joint: Secondary | ICD-10-CM | POA: Diagnosis not present

## 2016-08-30 DIAGNOSIS — M25662 Stiffness of left knee, not elsewhere classified: Secondary | ICD-10-CM | POA: Diagnosis not present

## 2016-08-30 DIAGNOSIS — M25562 Pain in left knee: Secondary | ICD-10-CM | POA: Diagnosis not present

## 2016-08-30 DIAGNOSIS — Z96652 Presence of left artificial knee joint: Secondary | ICD-10-CM | POA: Diagnosis not present

## 2016-09-04 DIAGNOSIS — M25662 Stiffness of left knee, not elsewhere classified: Secondary | ICD-10-CM | POA: Diagnosis not present

## 2016-09-04 DIAGNOSIS — Z96652 Presence of left artificial knee joint: Secondary | ICD-10-CM | POA: Diagnosis not present

## 2016-09-04 DIAGNOSIS — M25562 Pain in left knee: Secondary | ICD-10-CM | POA: Diagnosis not present

## 2016-09-06 DIAGNOSIS — M25662 Stiffness of left knee, not elsewhere classified: Secondary | ICD-10-CM | POA: Diagnosis not present

## 2016-09-06 DIAGNOSIS — M47816 Spondylosis without myelopathy or radiculopathy, lumbar region: Secondary | ICD-10-CM | POA: Diagnosis not present

## 2016-09-06 DIAGNOSIS — M25562 Pain in left knee: Secondary | ICD-10-CM | POA: Diagnosis not present

## 2016-09-06 DIAGNOSIS — Z96652 Presence of left artificial knee joint: Secondary | ICD-10-CM | POA: Diagnosis not present

## 2016-09-12 DIAGNOSIS — M25562 Pain in left knee: Secondary | ICD-10-CM | POA: Diagnosis not present

## 2016-09-12 DIAGNOSIS — M25662 Stiffness of left knee, not elsewhere classified: Secondary | ICD-10-CM | POA: Diagnosis not present

## 2016-09-12 DIAGNOSIS — Z96652 Presence of left artificial knee joint: Secondary | ICD-10-CM | POA: Diagnosis not present

## 2016-09-14 DIAGNOSIS — M25662 Stiffness of left knee, not elsewhere classified: Secondary | ICD-10-CM | POA: Diagnosis not present

## 2016-09-14 DIAGNOSIS — M25562 Pain in left knee: Secondary | ICD-10-CM | POA: Diagnosis not present

## 2016-09-14 DIAGNOSIS — Z96652 Presence of left artificial knee joint: Secondary | ICD-10-CM | POA: Diagnosis not present

## 2016-09-18 DIAGNOSIS — M25662 Stiffness of left knee, not elsewhere classified: Secondary | ICD-10-CM | POA: Diagnosis not present

## 2016-09-18 DIAGNOSIS — Z96652 Presence of left artificial knee joint: Secondary | ICD-10-CM | POA: Diagnosis not present

## 2016-09-18 DIAGNOSIS — M25562 Pain in left knee: Secondary | ICD-10-CM | POA: Diagnosis not present

## 2016-09-19 DIAGNOSIS — Z96652 Presence of left artificial knee joint: Secondary | ICD-10-CM | POA: Diagnosis not present

## 2016-09-19 DIAGNOSIS — M25662 Stiffness of left knee, not elsewhere classified: Secondary | ICD-10-CM | POA: Diagnosis not present

## 2016-09-19 DIAGNOSIS — M25562 Pain in left knee: Secondary | ICD-10-CM | POA: Diagnosis not present

## 2016-09-25 DIAGNOSIS — M25562 Pain in left knee: Secondary | ICD-10-CM | POA: Diagnosis not present

## 2016-09-25 DIAGNOSIS — M25662 Stiffness of left knee, not elsewhere classified: Secondary | ICD-10-CM | POA: Diagnosis not present

## 2016-09-25 DIAGNOSIS — M533 Sacrococcygeal disorders, not elsewhere classified: Secondary | ICD-10-CM | POA: Diagnosis not present

## 2016-09-25 DIAGNOSIS — Z96652 Presence of left artificial knee joint: Secondary | ICD-10-CM | POA: Diagnosis not present

## 2016-09-26 DIAGNOSIS — M533 Sacrococcygeal disorders, not elsewhere classified: Secondary | ICD-10-CM | POA: Diagnosis not present

## 2016-09-27 DIAGNOSIS — L820 Inflamed seborrheic keratosis: Secondary | ICD-10-CM | POA: Diagnosis not present

## 2016-09-27 DIAGNOSIS — Z1283 Encounter for screening for malignant neoplasm of skin: Secondary | ICD-10-CM | POA: Diagnosis not present

## 2016-09-27 DIAGNOSIS — L821 Other seborrheic keratosis: Secondary | ICD-10-CM | POA: Diagnosis not present

## 2016-09-27 DIAGNOSIS — X32XXXD Exposure to sunlight, subsequent encounter: Secondary | ICD-10-CM | POA: Diagnosis not present

## 2016-09-27 DIAGNOSIS — L570 Actinic keratosis: Secondary | ICD-10-CM | POA: Diagnosis not present

## 2016-11-01 DIAGNOSIS — M47816 Spondylosis without myelopathy or radiculopathy, lumbar region: Secondary | ICD-10-CM | POA: Diagnosis not present

## 2016-11-11 DIAGNOSIS — Z23 Encounter for immunization: Secondary | ICD-10-CM | POA: Diagnosis not present

## 2016-11-14 DIAGNOSIS — M25562 Pain in left knee: Secondary | ICD-10-CM | POA: Diagnosis not present

## 2016-11-14 DIAGNOSIS — Z9889 Other specified postprocedural states: Secondary | ICD-10-CM | POA: Diagnosis not present

## 2016-11-14 DIAGNOSIS — G5622 Lesion of ulnar nerve, left upper limb: Secondary | ICD-10-CM | POA: Diagnosis not present

## 2016-11-14 DIAGNOSIS — M1711 Unilateral primary osteoarthritis, right knee: Secondary | ICD-10-CM | POA: Diagnosis not present

## 2016-11-20 DIAGNOSIS — M4696 Unspecified inflammatory spondylopathy, lumbar region: Secondary | ICD-10-CM | POA: Diagnosis not present

## 2016-11-20 DIAGNOSIS — M545 Low back pain: Secondary | ICD-10-CM | POA: Diagnosis not present

## 2016-11-21 DIAGNOSIS — M545 Low back pain: Secondary | ICD-10-CM | POA: Diagnosis not present

## 2016-11-21 DIAGNOSIS — M4326 Fusion of spine, lumbar region: Secondary | ICD-10-CM | POA: Diagnosis not present

## 2016-11-28 DIAGNOSIS — M545 Low back pain: Secondary | ICD-10-CM | POA: Diagnosis not present

## 2016-11-28 DIAGNOSIS — M4326 Fusion of spine, lumbar region: Secondary | ICD-10-CM | POA: Diagnosis not present

## 2016-12-05 DIAGNOSIS — M545 Low back pain: Secondary | ICD-10-CM | POA: Diagnosis not present

## 2016-12-05 DIAGNOSIS — M4326 Fusion of spine, lumbar region: Secondary | ICD-10-CM | POA: Diagnosis not present

## 2016-12-07 DIAGNOSIS — M1711 Unilateral primary osteoarthritis, right knee: Secondary | ICD-10-CM | POA: Diagnosis not present

## 2016-12-07 DIAGNOSIS — M4326 Fusion of spine, lumbar region: Secondary | ICD-10-CM | POA: Diagnosis not present

## 2016-12-07 DIAGNOSIS — M545 Low back pain: Secondary | ICD-10-CM | POA: Diagnosis not present

## 2016-12-13 DIAGNOSIS — M545 Low back pain: Secondary | ICD-10-CM | POA: Diagnosis not present

## 2016-12-13 DIAGNOSIS — M4326 Fusion of spine, lumbar region: Secondary | ICD-10-CM | POA: Diagnosis not present

## 2016-12-13 DIAGNOSIS — M47816 Spondylosis without myelopathy or radiculopathy, lumbar region: Secondary | ICD-10-CM | POA: Diagnosis not present

## 2016-12-14 DIAGNOSIS — M1711 Unilateral primary osteoarthritis, right knee: Secondary | ICD-10-CM | POA: Diagnosis not present

## 2016-12-14 DIAGNOSIS — M25562 Pain in left knee: Secondary | ICD-10-CM | POA: Diagnosis not present

## 2016-12-15 DIAGNOSIS — M545 Low back pain: Secondary | ICD-10-CM | POA: Diagnosis not present

## 2016-12-15 DIAGNOSIS — M4326 Fusion of spine, lumbar region: Secondary | ICD-10-CM | POA: Diagnosis not present

## 2016-12-25 DIAGNOSIS — M545 Low back pain: Secondary | ICD-10-CM | POA: Diagnosis not present

## 2016-12-25 DIAGNOSIS — M4326 Fusion of spine, lumbar region: Secondary | ICD-10-CM | POA: Diagnosis not present

## 2016-12-26 DIAGNOSIS — M1711 Unilateral primary osteoarthritis, right knee: Secondary | ICD-10-CM | POA: Diagnosis not present

## 2016-12-27 DIAGNOSIS — I10 Essential (primary) hypertension: Secondary | ICD-10-CM | POA: Diagnosis not present

## 2016-12-27 DIAGNOSIS — R42 Dizziness and giddiness: Secondary | ICD-10-CM | POA: Diagnosis not present

## 2016-12-27 DIAGNOSIS — R7303 Prediabetes: Secondary | ICD-10-CM | POA: Diagnosis not present

## 2016-12-27 DIAGNOSIS — E669 Obesity, unspecified: Secondary | ICD-10-CM | POA: Diagnosis not present

## 2017-01-05 DIAGNOSIS — R972 Elevated prostate specific antigen [PSA]: Secondary | ICD-10-CM | POA: Diagnosis not present

## 2017-01-10 DIAGNOSIS — M4326 Fusion of spine, lumbar region: Secondary | ICD-10-CM | POA: Diagnosis not present

## 2017-01-10 DIAGNOSIS — M545 Low back pain: Secondary | ICD-10-CM | POA: Diagnosis not present

## 2017-01-11 DIAGNOSIS — M4326 Fusion of spine, lumbar region: Secondary | ICD-10-CM | POA: Diagnosis not present

## 2017-01-11 DIAGNOSIS — E119 Type 2 diabetes mellitus without complications: Secondary | ICD-10-CM | POA: Diagnosis not present

## 2017-01-11 DIAGNOSIS — I10 Essential (primary) hypertension: Secondary | ICD-10-CM | POA: Diagnosis not present

## 2017-01-11 DIAGNOSIS — R350 Frequency of micturition: Secondary | ICD-10-CM | POA: Diagnosis not present

## 2017-01-11 DIAGNOSIS — H8110 Benign paroxysmal vertigo, unspecified ear: Secondary | ICD-10-CM | POA: Diagnosis not present

## 2017-01-11 DIAGNOSIS — R972 Elevated prostate specific antigen [PSA]: Secondary | ICD-10-CM | POA: Diagnosis not present

## 2017-01-11 DIAGNOSIS — M545 Low back pain: Secondary | ICD-10-CM | POA: Diagnosis not present

## 2017-01-11 DIAGNOSIS — N401 Enlarged prostate with lower urinary tract symptoms: Secondary | ICD-10-CM | POA: Diagnosis not present

## 2017-01-16 DIAGNOSIS — H8110 Benign paroxysmal vertigo, unspecified ear: Secondary | ICD-10-CM | POA: Diagnosis not present

## 2017-01-17 DIAGNOSIS — M4326 Fusion of spine, lumbar region: Secondary | ICD-10-CM | POA: Diagnosis not present

## 2017-01-17 DIAGNOSIS — M545 Low back pain: Secondary | ICD-10-CM | POA: Diagnosis not present

## 2017-01-23 DIAGNOSIS — H8110 Benign paroxysmal vertigo, unspecified ear: Secondary | ICD-10-CM | POA: Diagnosis not present

## 2017-01-31 DIAGNOSIS — G5622 Lesion of ulnar nerve, left upper limb: Secondary | ICD-10-CM | POA: Diagnosis not present

## 2017-02-02 DIAGNOSIS — R972 Elevated prostate specific antigen [PSA]: Secondary | ICD-10-CM | POA: Diagnosis not present

## 2017-02-02 DIAGNOSIS — E785 Hyperlipidemia, unspecified: Secondary | ICD-10-CM | POA: Diagnosis not present

## 2017-02-02 DIAGNOSIS — R319 Hematuria, unspecified: Secondary | ICD-10-CM | POA: Diagnosis not present

## 2017-02-02 DIAGNOSIS — Z0000 Encounter for general adult medical examination without abnormal findings: Secondary | ICD-10-CM | POA: Diagnosis not present

## 2017-02-02 DIAGNOSIS — D709 Neutropenia, unspecified: Secondary | ICD-10-CM | POA: Diagnosis not present

## 2017-02-02 DIAGNOSIS — I129 Hypertensive chronic kidney disease with stage 1 through stage 4 chronic kidney disease, or unspecified chronic kidney disease: Secondary | ICD-10-CM | POA: Diagnosis not present

## 2017-02-02 DIAGNOSIS — Z125 Encounter for screening for malignant neoplasm of prostate: Secondary | ICD-10-CM | POA: Diagnosis not present

## 2017-02-02 DIAGNOSIS — N390 Urinary tract infection, site not specified: Secondary | ICD-10-CM | POA: Diagnosis not present

## 2017-02-06 DIAGNOSIS — R1010 Upper abdominal pain, unspecified: Secondary | ICD-10-CM | POA: Diagnosis not present

## 2017-02-06 DIAGNOSIS — Z23 Encounter for immunization: Secondary | ICD-10-CM | POA: Diagnosis not present

## 2017-02-06 DIAGNOSIS — M179 Osteoarthritis of knee, unspecified: Secondary | ICD-10-CM | POA: Diagnosis not present

## 2017-02-06 DIAGNOSIS — M4807 Spinal stenosis, lumbosacral region: Secondary | ICD-10-CM | POA: Diagnosis not present

## 2017-02-06 DIAGNOSIS — N401 Enlarged prostate with lower urinary tract symptoms: Secondary | ICD-10-CM | POA: Diagnosis not present

## 2017-02-06 DIAGNOSIS — E785 Hyperlipidemia, unspecified: Secondary | ICD-10-CM | POA: Diagnosis not present

## 2017-02-06 DIAGNOSIS — J309 Allergic rhinitis, unspecified: Secondary | ICD-10-CM | POA: Diagnosis not present

## 2017-02-06 DIAGNOSIS — Z6836 Body mass index (BMI) 36.0-36.9, adult: Secondary | ICD-10-CM | POA: Diagnosis not present

## 2017-02-06 DIAGNOSIS — I10 Essential (primary) hypertension: Secondary | ICD-10-CM | POA: Diagnosis not present

## 2017-02-06 DIAGNOSIS — R7303 Prediabetes: Secondary | ICD-10-CM | POA: Diagnosis not present

## 2017-02-06 DIAGNOSIS — R3129 Other microscopic hematuria: Secondary | ICD-10-CM | POA: Diagnosis not present

## 2017-02-06 DIAGNOSIS — K219 Gastro-esophageal reflux disease without esophagitis: Secondary | ICD-10-CM | POA: Diagnosis not present

## 2017-02-07 DIAGNOSIS — G5622 Lesion of ulnar nerve, left upper limb: Secondary | ICD-10-CM | POA: Diagnosis not present

## 2017-02-08 DIAGNOSIS — K8020 Calculus of gallbladder without cholecystitis without obstruction: Secondary | ICD-10-CM | POA: Diagnosis not present

## 2017-02-08 DIAGNOSIS — R1010 Upper abdominal pain, unspecified: Secondary | ICD-10-CM | POA: Diagnosis not present

## 2017-02-08 DIAGNOSIS — K760 Fatty (change of) liver, not elsewhere classified: Secondary | ICD-10-CM | POA: Diagnosis not present

## 2017-02-20 DIAGNOSIS — M47816 Spondylosis without myelopathy or radiculopathy, lumbar region: Secondary | ICD-10-CM | POA: Diagnosis not present

## 2017-02-22 DIAGNOSIS — G5622 Lesion of ulnar nerve, left upper limb: Secondary | ICD-10-CM | POA: Diagnosis not present

## 2017-02-26 ENCOUNTER — Encounter: Payer: Self-pay | Admitting: Vascular Surgery

## 2017-02-27 ENCOUNTER — Encounter: Payer: Self-pay | Admitting: Vascular Surgery

## 2017-02-27 DIAGNOSIS — G5622 Lesion of ulnar nerve, left upper limb: Secondary | ICD-10-CM | POA: Diagnosis not present

## 2017-03-06 DIAGNOSIS — E785 Hyperlipidemia, unspecified: Secondary | ICD-10-CM | POA: Diagnosis not present

## 2017-03-09 DIAGNOSIS — E785 Hyperlipidemia, unspecified: Secondary | ICD-10-CM | POA: Diagnosis not present

## 2017-03-16 ENCOUNTER — Ambulatory Visit (INDEPENDENT_AMBULATORY_CARE_PROVIDER_SITE_OTHER): Payer: Medicare Other | Admitting: Vascular Surgery

## 2017-03-16 ENCOUNTER — Encounter: Payer: Self-pay | Admitting: Vascular Surgery

## 2017-03-16 ENCOUNTER — Other Ambulatory Visit: Payer: Self-pay

## 2017-03-16 VITALS — BP 140/90 | HR 104 | Temp 98.1°F | Resp 20 | Ht 67.5 in | Wt 238.0 lb

## 2017-03-16 DIAGNOSIS — I714 Abdominal aortic aneurysm, without rupture, unspecified: Secondary | ICD-10-CM

## 2017-03-16 NOTE — Progress Notes (Signed)
Patient ID: Walter Harvey., male   DOB: April 13, 1950, 67 y.o.   MRN: [id number]  Reason for Consult: New Patient (Initial Visit) (AAA.  Dr. Shelia Media [phone].)   Referred by Thressa Sheller, MD  Subjective:     HPI:  Walter Bostic. is a 67 y.o. male with history of hypertension hyperlipidemia chronic back pain for which she is undergone multiple surgeries and was recently evaluated for abdominal pain and found to have an aneurysm in his abdomen.  He does have one elderly family member who was treated for aneurysmal disease but does not know of other family members.  He is a former smoker but did quit in the 1980s.  He does not have any new back or abdominal pain to suggest any acute issues with his aneurysm.  He does not take any blood thinners.  He was recently taken off of aspirin although he does take a statin drug.  He is not having any complaints related to today's visit.  Past Medical History:  Diagnosis Date  . Allergy    Allergic Rhinitis  . Anticipated difficulty with intubation   . Arthritis    spinal steniosis  . At risk for difficult intubation    no trouble intubating as far as pt knows- swelling afterward -above note previous  . Benign prostate hyperplasia    with lower urinary tract symptoms  . Carpal tunnel syndrome on right   . Chronic back pain    caused by spinal stenosis  . 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 Elevated PSA   . GERD (gastroesophageal reflux disease)   . Hyperlipidemia   . Hypertension    Essential.    . Microhematuria   . OA (osteoarthritis) of knee   . Pneumonia    Hx -when very young  . Pre-diabetes   . Seasonal allergies   . Spinal stenosis    of lumbosacral region.  Causes chronic back pain.  [name] Upper abdominal pain    Family History  Problem Relation Age of Onset  . Cancer Mother   . Liver disease Father   . Colon cancer Neg  Hx   . Colon polyps Neg Hx   . Esophageal cancer Neg Hx   . Rectal cancer Neg Hx   . Stomach cancer Neg Hx    Past Surgical History:  Procedure Laterality Date  . ANTERIOR LAT LUMBAR FUSION Left 01/26/2016   Procedure: LEFT SIDED LATERAL INTERBODY FUSION, LUMBAR 3-4 WITH INSTRUMENTATION AND ALLOGRAFT;  Surgeon: Phylliss Bob, MD;  Location: [place];  Service: Orthopedics;  Laterality: Left;  LEFT SIDED LATERAL INTERBODY FUSION, LUMBAR 3-4 WITH INSTRUMENTATION AND ALLOGRAFT  . BACK SURGERY  01/2015   dr. Lynann Bologna. [phone].  [name] CARPAL TUNNEL RELEASE     right   . COLONOSCOPY    . COLONOSCOPY  2010   polyp  . ELBOW SURGERY     right ulnar release  . JOINT REPLACEMENT    . KNEE ARTHROSCOPY  11/23/2010   Procedure: ARTHROSCOPY KNEE;  Surgeon: Kerin Salen;  Location: [facility];  Service: Orthopedics;  Laterality: Left;  arthroscopy left knee  . KNEE ARTHROSCOPY     left x 2  . Knee scar tissue removal Left 2018  . LATERAL FUSION LUMBAR SPINE  01./17/2018  . polypectomy  2010  . SHOULDER ARTHROSCOPY Left  x2  . TOTAL KNEE ARTHROPLASTY Left 09/21/2014   Procedure: TOTAL KNEE ARTHROPLASTY;  Surgeon: Frederik Pear, MD;  Location: [place];  Service: Orthopedics;  Laterality: Left;  . WISDOM TOOTH EXTRACTION      Short Social History:  Social History   Tobacco Use  . Smoking status: Former Smoker    Last attempt to quit: 11/21/1980    Years since quitting: 36.3  . Smokeless tobacco: Never Used  Substance Use Topics  . Alcohol use: Yes    Alcohol/week: 0.0 oz    Comment: 2-4 times per mth.    Allergies  Allergen Reactions  . Lactose Intolerance (Gi)     Dairy--allergic per allergy test  . Lovastatin   . Penicillins     Per pt: unknown Has patient had a PCN reaction causing immediate rash, facial/tongue/throat swelling, SOB or lightheadedness with hypotension: Unknown Has patient had a PCN reaction causing severe rash involving mucus membranes or skin necrosis:  Unknown Has patient had a PCN reaction that required hospitalization: Unknown Has patient had a PCN reaction occurring within the last 10 years: No If all of the above answers are "NO", then may proceed with Cephalosporin use.   . Pork-Derived Products Other (See Comments)    Per allergy test  . Shellfish Allergy Other (See Comments)    Per allergy test     Current Outpatient Medications  Medication Sig Dispense Refill  . DICLOFENAC PO Take by mouth.    . docusate sodium (COLACE) 50 MG capsule Take 50 mg by mouth daily.    . fluticasone (FLONASE) 50 MCG/ACT nasal spray Place 1 spray into both nostrils daily as needed. For allergies    . gabapentin (NEURONTIN) 300 MG capsule Take 1 capsule (300 mg total) by mouth 3 (three) times daily. (Patient taking differently: Take 300 mg by mouth 2 (two) times daily. ) 90 capsule 11  . lisinopril (PRINIVIL,ZESTRIL) 10 MG tablet Take 10 mg by mouth at bedtime.     . methocarbamol (ROBAXIN) 500 MG tablet Take 500 mg by mouth 4 (four) times daily.    . Nutritional Supplements (JUICE PLUS FIBRE PO) Take 6 capsules by mouth daily. Juice Plus+ Orchard Blend 2 capsules in the morning Juice Plus+ Garden Blend 2 capsules in the morning Juice Plus+ Vineyard Blend 2 capsule in the morning    . Pantoprazole Sodium POWD 40 mg by Does not apply route. 1 packet orally.    . Tamsulosin HCl (FLOMAX) 0.4 MG CAPS Take 0.4 mg by mouth at bedtime.     . traMADol (ULTRAM) 50 MG tablet Take 50 mg by mouth every 6 (six) hours as needed.     No current facility-administered medications for this visit.     Review of Systems  Constitutional:  Constitutional negative. HENT: HENT negative.  Cardiovascular: Cardiovascular negative.  GI: Gastrointestinal negative.  GU: Positive for hematuria.  Musculoskeletal: Positive for back pain and leg pain.  Hematologic: Hematologic/lymphatic negative.  Psychiatric: Psychiatric negative.        Objective:  Objective   Vitals:     03/16/17 1024  BP: 140/90  Pulse: (!) 104  Resp: 20  Temp: 98.1 F (36.7 C)  TempSrc: Oral  SpO2: 97%  Weight: 238 lb (108 kg)  Height: 5' 7.5" (1.715 m)   Body mass index is 36.73 kg/m.  Physical Exam  Constitutional: He is oriented to person, place, and time. He appears well-developed.  HENT:  Head: Normocephalic.  Eyes: Pupils are equal, round, and  reactive to light.  Neck: Normal range of motion.  Cardiovascular:  Pulses:      Radial pulses are 2+ on the right side, and 2+ on the left side.       Popliteal pulses are 2+ on the right side, and 2+ on the left side.       Dorsalis pedis pulses are 2+ on the right side, and 2+ on the left side.  Abdominal: Soft. He exhibits no mass.  Musculoskeletal: Normal range of motion. He exhibits no edema.  Neurological: He is alert and oriented to person, place, and time.  Skin: Skin is warm and dry.  Psychiatric: He has a normal mood and affect. His behavior is normal. Judgment and thought content normal.    Data: I do not have images for review but outside duplex demonstrated a 4 cm aneurysm is infrarenal abdominal aorta was fusiform in nature.     Assessment/Plan:     67 year old male presents for evaluation of 4 cm abdominal aortic aneurysm.  We have discussed the signs and symptoms of rupture and of also discussed that he is less than 1% risk of rupture given that he is 4 cm but this is only one point in time and we will need to get additional time points.  Should he remain asymptomatic we will see him in 6 months with repeat abdominal aortic duplex at which time I can review the images.  I discussed with him that we would consider pursuing CT scan for options of repair when he surpasses the 5 cm mark where he would transition to a greater than 5 cm rupture risk per year.  Along with his wife with the [facility] they do demonstrate very good understanding we will get him set up for 80-month follow-up today.     Waynetta Sandy MD [facility]

## 2017-03-19 DIAGNOSIS — M47816 Spondylosis without myelopathy or radiculopathy, lumbar region: Secondary | ICD-10-CM | POA: Diagnosis not present

## 2017-03-20 ENCOUNTER — Encounter: Payer: Self-pay | Admitting: Internal Medicine

## 2017-03-21 ENCOUNTER — Other Ambulatory Visit: Payer: Self-pay

## 2017-03-21 DIAGNOSIS — I714 Abdominal aortic aneurysm, without rupture, unspecified: Secondary | ICD-10-CM

## 2017-03-27 ENCOUNTER — Other Ambulatory Visit: Payer: Self-pay | Admitting: Orthopedic Surgery

## 2017-03-27 DIAGNOSIS — G5622 Lesion of ulnar nerve, left upper limb: Secondary | ICD-10-CM | POA: Diagnosis not present

## 2017-04-09 ENCOUNTER — Other Ambulatory Visit: Payer: Self-pay

## 2017-04-09 ENCOUNTER — Encounter (HOSPITAL_BASED_OUTPATIENT_CLINIC_OR_DEPARTMENT_OTHER): Payer: Self-pay | Admitting: *Deleted

## 2017-04-09 NOTE — Pre-Procedure Instructions (Signed)
Bring all medications. Pt is coming tomorrow for BMET. Requested EKG from Dr. Pennie Banter office.

## 2017-04-10 ENCOUNTER — Encounter (HOSPITAL_BASED_OUTPATIENT_CLINIC_OR_DEPARTMENT_OTHER)
Admission: RE | Admit: 2017-04-10 | Discharge: 2017-04-10 | Disposition: A | Discharge status: Home / Self Care | Payer: Medicare Other | Source: Ambulatory Visit | Attending: Orthopedic Surgery | Admitting: Orthopedic Surgery

## 2017-04-10 DIAGNOSIS — Z01812 Encounter for preprocedural laboratory examination: Secondary | ICD-10-CM | POA: Insufficient documentation

## 2017-04-10 DIAGNOSIS — Z01810 Encounter for preprocedural cardiovascular examination: Secondary | ICD-10-CM | POA: Diagnosis not present

## 2017-04-10 LAB — BASIC METABOLIC PANEL
ANION GAP: 7 (ref 5–15)
BUN: 33 mg/dL — ABNORMAL HIGH (ref 6–20)
CHLORIDE: 104 mmol/L (ref 101–111)
CO2: 25 mmol/L (ref 22–32)
Calcium: 9.4 mg/dL (ref 8.9–10.3)
Creatinine, Ser: 0.98 mg/dL (ref 0.61–1.24)
GFR calc non Af Amer: >60 mL/min (ref >60)
Glucose, Bld: 102 mg/dL — ABNORMAL HIGH (ref 65–99)
POTASSIUM: 4 mmol/L (ref 3.5–5.1)
SODIUM: 136 mmol/L (ref 135–145)

## 2017-04-10 NOTE — Progress Notes (Signed)
Lab results reviewed with Dr. Thomes Cake, will proceed with surgery as scheduled.

## 2017-04-11 DIAGNOSIS — M47816 Spondylosis without myelopathy or radiculopathy, lumbar region: Secondary | ICD-10-CM | POA: Diagnosis not present

## 2017-04-12 NOTE — H&P (Signed)
Walter White. is an 67 y.o. male.   CC / Reason for Visit: Left hand numbness and tingling HPI: This patient returns reevaluation, having undergone cubital tunnel steroid injection with Dr. Rip Harbour on 02-27-17.  Patient reports that during the lidocaine phase he had numbness of the whole hand, and that perhaps some of the pain about the elbow has decreased, but overall much of his symptoms remained not significantly improved.  HPI 02-22-17: This patient is a 67 year old, right-hand-dominant, male who indicates that he has started to have numbness and tingling into his left small finger and one half of his ring finger for the last several months.  He was seen for this and actually had cubital tunnel and carpal tunnel release on April 2017 by Dr. Mayer Camel.  Approximately 2 months ago, the patient had an increased in numbness and tingling into his small and half of his ring fingers that has been intensifying and becoming more consistent and not intermittent.  The patient indicates that he has trouble maintaining his grip and that his small finger remains numb.  Dr. Mayer Camel sent the patient for new EMG studies on 01/31/2017 with Dr. 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  He is here today for further evaluation and treatment.  Past Medical History:  Diagnosis Date  . Allergy    Allergic Rhinitis  . Anticipated difficulty with intubation   . Arthritis    spinal steniosis  . At risk for difficult intubation    no trouble intubating as far as pt knows- swelling afterward -above note previous  . Benign prostate hyperplasia    with lower urinary tract symptoms  . Carpal tunnel syndrome on right   . Chronic back pain    caused by spinal stenosis  . Difficult intubation 1980s-90s   'my throat swelled up when they put the tube down' pt has had multiple surgeries since with no issue   . Elevated PSA   . GERD (gastroesophageal reflux disease)   . Hyperlipidemia   . Hypertension    Essential.    . Microhematuria   . OA (osteoarthritis) of knee   . Pneumonia    Hx -when very young  . Pre-diabetes   . Seasonal allergies   . Spinal stenosis    of lumbosacral region.  Causes chronic back pain.  [name] Upper abdominal pain     Past Surgical History:  Procedure Laterality Date  . ANTERIOR LAT LUMBAR FUSION Left 01/26/2016   Procedure: LEFT SIDED LATERAL INTERBODY FUSION, LUMBAR 3-4 WITH INSTRUMENTATION AND ALLOGRAFT;  Surgeon: Phylliss Bob, MD;  Location: [place];  Service: Orthopedics;  Laterality: Left;  LEFT SIDED LATERAL INTERBODY FUSION, LUMBAR 3-4 WITH INSTRUMENTATION AND ALLOGRAFT  . BACK SURGERY  01/2015   dr. Lynann Bologna. [phone].  [name] CARPAL TUNNEL RELEASE     right   . COLONOSCOPY    . COLONOSCOPY  2010   polyp  . ELBOW SURGERY     right ulnar release  . JOINT REPLACEMENT     left- Sept 2016  . KNEE ARTHROSCOPY  11/23/2010   Procedure: ARTHROSCOPY KNEE;  Surgeon: Kerin Salen;  Location: [place];  Service: Orthopedics;  Laterality: Left;  arthroscopy left knee  . KNEE ARTHROSCOPY     left x 2  . Knee scar tissue removal Left 2018  . LATERAL FUSION LUMBAR SPINE  01./17/2018  .  polypectomy  2010  . SHOULDER ARTHROSCOPY Left    x2  . TOTAL KNEE ARTHROPLASTY Left 09/21/2014   Procedure: TOTAL KNEE ARTHROPLASTY;  Surgeon: Frederik Pear, MD;  Location: [place];  Service: Orthopedics;  Laterality: Left;  . WISDOM TOOTH EXTRACTION      Family History  Problem Relation Age of Onset  . Cancer Mother   . Liver disease Father   . Colon cancer Neg Hx   . Colon polyps Neg Hx   . Esophageal cancer Neg Hx   . Rectal cancer Neg Hx   . Stomach cancer Neg Hx    Social History:  reports that he quit smoking about 36 years ago. He has never used smokeless tobacco. He reports that he drinks alcohol. He reports that he does not use drugs.  Allergies:   Allergies  Allergen Reactions  . Lactose Intolerance (Gi)     Dairy--allergic per allergy test  . Lovastatin   . Penicillins     Per pt: unknown Has patient had a PCN reaction causing immediate rash, facial/tongue/throat swelling, SOB or lightheadedness with hypotension: Unknown Has patient had a PCN reaction causing severe rash involving mucus membranes or skin necrosis: Unknown Has patient had a PCN reaction that required hospitalization: Unknown Has patient had a PCN reaction occurring within the last 10 years: No If all of the above answers are "NO", then may proceed with Cephalosporin use.   . Pork-Derived Products Other (See Comments)    Per allergy test  . Shellfish Allergy Other (See Comments)    Per allergy test     No medications prior to admission.    Results for orders placed or performed during the hospital encounter of 04/16/17 (from the past 48 hour(s))  Basic metabolic panel     Status: Abnormal   Collection Time: 04/10/17 11:34 AM  Result Value Ref Range   Sodium 136 135 - 145 mmol/L   Potassium 4.0 3.5 - 5.1 mmol/L   Chloride 104 101 - 111 mmol/L   CO2 25 22 - 32 mmol/L   Glucose, Bld 102 (H) 65 - 99 mg/dL   BUN 33 (H) 6 - 20 mg/dL   Creatinine, Ser 0.98 0.61 - 1.24 mg/dL   Calcium 9.4 8.9 - 10.3 mg/dL   GFR calc non Af Amer >60 >60 mL/min   GFR calc Af Amer >60 >60 mL/min    Comment: (NOTE) The eGFR has been calculated using the CKD EPI equation. This calculation has not been validated in all clinical situations. eGFR's persistently <60 mL/min signify possible Chronic Kidney Disease.    Anion gap 7 5 - 15    Comment: Performed at [facility] [address] [place], [place] [address]   No results found.  Review of Systems  All other systems reviewed and are negative.   Height _0  (1.702 m), weight 106.6 kg (235 lb). Physical Exam  Constitutional:  WD, WN, NAD HEENT:  NCAT, EOMI Neuro/Psych:  Alert & oriented to person, place, and  time; appropriate mood & affect Lymphatic: No generalized UE edema or lymphadenopathy Extremities / MSK:  Both UE are normal with respect to appearance, ranges of motion, joint stability, muscle strength/tone, sensation, & perfusion except as otherwise noted:  The left arm and hand is normal in appearance.  The patient does have full elbow range of motion with pain directly over the cubital tunnel with full flexion.  The patient has full digital range of motion and normal posturing.  There is little to no intrinsic wasting.  FDP of small and ring fingers are intact and 5 out of 5 against resistance.  Froment's sign is negative.  Intrinsic strength when compared to contralateral side is only slightly weaker.  Light touch sensibility on the volar aspect of the small finger is quite altered as is appropriate ulnar half of the ring finger.  In addition to irritability and Tinel sign at the elbow, there remains a little bit of irritability and Tinel sign in the region of Guyon's canal.  Refer to monofilament data from 02-22-17  Labs / Xrays:  No radiographic studies obtained today.  EMG nerve conduction studies performed on 01/31/2017 indicated left ulnar sensory nerve involvement at the cubital tunnel.  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  We discussed how the electrodiagnostic studies are not helpful in establishing compressive neuropathy of the ulnar nerve at the wrist, when disease at the elbow was already present.  We discussed competing strategies, to include addressing the elbow first followed by a period of observation versus addressing both the elbow and wrist in the same setting.  After careful consideration deliberation, we decided to proceed both with left ulnar nerve decompression and likely subcutaneous transposition at the elbow, as well as decompression through the  distal forearm and hand.  He will work with [employer] to select a date that suits him, as he has a fishing trip planned for late May.  We discussed postoperative expectations and recovery details.  The details of the operative procedure were discussed with the patient.  Questions were invited and answered.  In addition to the goal of the procedure, the risks of the procedure to include but not limited to bleeding; infection; damage to the nerves or blood vessels that could result in bleeding, numbness, weakness, chronic pain, and the need for additional procedures; stiffness; the need for revision surgery; and anesthetic risks were reviewed.  No specific outcome was guaranteed or implied.  Informed consent was obtained.  [name], MD 04/12/2017, 8:56 AM

## 2017-04-13 DIAGNOSIS — R972 Elevated prostate specific antigen [PSA]: Secondary | ICD-10-CM | POA: Diagnosis not present

## 2017-04-16 ENCOUNTER — Ambulatory Visit (HOSPITAL_BASED_OUTPATIENT_CLINIC_OR_DEPARTMENT_OTHER)
Admission: RE | Admit: 2017-04-16 | Discharge: 2017-04-16 | Disposition: A | Discharge status: Home / Self Care | Payer: Medicare Other | Source: Ambulatory Visit | Attending: Orthopedic Surgery | Admitting: Orthopedic Surgery

## 2017-04-16 ENCOUNTER — Other Ambulatory Visit: Payer: Self-pay

## 2017-04-16 ENCOUNTER — Encounter (HOSPITAL_BASED_OUTPATIENT_CLINIC_OR_DEPARTMENT_OTHER): Payer: Self-pay

## 2017-04-16 ENCOUNTER — Ambulatory Visit (HOSPITAL_BASED_OUTPATIENT_CLINIC_OR_DEPARTMENT_OTHER): Payer: Medicare Other | Admitting: Certified Registered"

## 2017-04-16 ENCOUNTER — Encounter (HOSPITAL_BASED_OUTPATIENT_CLINIC_OR_DEPARTMENT_OTHER)
Admission: RE | Disposition: A | Discharge status: Home / Self Care | Payer: Self-pay | Source: Ambulatory Visit | Attending: Orthopedic Surgery

## 2017-04-16 DIAGNOSIS — Z96652 Presence of left artificial knee joint: Secondary | ICD-10-CM | POA: Diagnosis not present

## 2017-04-16 DIAGNOSIS — N400 Enlarged prostate without lower urinary tract symptoms: Secondary | ICD-10-CM | POA: Diagnosis not present

## 2017-04-16 DIAGNOSIS — Z87891 Personal history of nicotine dependence: Secondary | ICD-10-CM | POA: Diagnosis not present

## 2017-04-16 DIAGNOSIS — K219 Gastro-esophageal reflux disease without esophagitis: Secondary | ICD-10-CM | POA: Diagnosis not present

## 2017-04-16 DIAGNOSIS — J309 Allergic rhinitis, unspecified: Secondary | ICD-10-CM | POA: Insufficient documentation

## 2017-04-16 DIAGNOSIS — E669 Obesity, unspecified: Secondary | ICD-10-CM | POA: Insufficient documentation

## 2017-04-16 DIAGNOSIS — I10 Essential (primary) hypertension: Secondary | ICD-10-CM | POA: Diagnosis not present

## 2017-04-16 DIAGNOSIS — Z6836 Body mass index (BMI) 36.0-36.9, adult: Secondary | ICD-10-CM | POA: Insufficient documentation

## 2017-04-16 DIAGNOSIS — E785 Hyperlipidemia, unspecified: Secondary | ICD-10-CM | POA: Diagnosis not present

## 2017-04-16 DIAGNOSIS — J302 Other seasonal allergic rhinitis: Secondary | ICD-10-CM | POA: Insufficient documentation

## 2017-04-16 DIAGNOSIS — G8918 Other acute postprocedural pain: Secondary | ICD-10-CM | POA: Diagnosis not present

## 2017-04-16 DIAGNOSIS — G5622 Lesion of ulnar nerve, left upper limb: Secondary | ICD-10-CM | POA: Insufficient documentation

## 2017-04-16 HISTORY — PX: ULNAR NERVE TRANSPOSITION: SHX2595

## 2017-04-16 SURGERY — ULNAR NERVE DECOMPRESSION/TRANSPOSITION
Anesthesia: General | Laterality: Left

## 2017-04-16 MED ORDER — LACTATED RINGERS IV SOLN
INTRAVENOUS | Status: DC
  Administered 2017-04-16: 10:00:00 via INTRAVENOUS

## 2017-04-16 MED ORDER — DEXTROSE 5 % IV SOLN
INTRAVENOUS | Status: DC | PRN
  Administered 2017-04-16: 40 ug/min via INTRAVENOUS

## 2017-04-16 MED ORDER — ONDANSETRON HCL 4 MG/2ML IJ SOLN: 4.0000 mg | Freq: Once | INTRAMUSCULAR | Status: DC | PRN

## 2017-04-16 MED ORDER — FENTANYL CITRATE (PF) 100 MCG/2ML IJ SOLN: 25.0000 ug | INTRAMUSCULAR | Status: DC | PRN

## 2017-04-16 MED ORDER — SCOPOLAMINE 1 MG/3DAYS TD PT72: 1.0000 | MEDICATED_PATCH | Freq: Once | TRANSDERMAL | Status: DC | PRN

## 2017-04-16 MED ORDER — SODIUM CHLORIDE 0.9 % IR SOLN
Status: DC | PRN
  Administered 2017-04-16: 1000 mL

## 2017-04-16 MED ORDER — PROPOFOL 10 MG/ML IV BOLUS
INTRAVENOUS | Status: DC | PRN
  Administered 2017-04-16: 150 mg via INTRAVENOUS

## 2017-04-16 MED ORDER — FENTANYL CITRATE (PF) 100 MCG/2ML IJ SOLN
INTRAMUSCULAR | Status: AC
  Filled 2017-04-16: qty 2

## 2017-04-16 MED ORDER — FENTANYL CITRATE (PF) 100 MCG/2ML IJ SOLN
50.0000 ug | INTRAMUSCULAR | Status: DC | PRN
  Administered 2017-04-16: 50 ug via INTRAVENOUS

## 2017-04-16 MED ORDER — MIDAZOLAM HCL 2 MG/2ML IJ SOLN
INTRAMUSCULAR | Status: AC
  Filled 2017-04-16: qty 2

## 2017-04-16 MED ORDER — ONDANSETRON HCL 4 MG/2ML IJ SOLN
INTRAMUSCULAR | Status: DC | PRN
  Administered 2017-04-16: 4 mg via INTRAVENOUS

## 2017-04-16 MED ORDER — CLINDAMYCIN PHOSPHATE 900 MG/50ML IV SOLN
INTRAVENOUS | Status: AC
Start: 2017-04-16 — End: ?
  Filled 2017-04-16: qty 50

## 2017-04-16 MED ORDER — DEXAMETHASONE SODIUM PHOSPHATE 10 MG/ML IJ SOLN
INTRAMUSCULAR | Status: DC | PRN
  Administered 2017-04-16: 10 mg via INTRAVENOUS

## 2017-04-16 MED ORDER — LIDOCAINE HCL (CARDIAC) 20 MG/ML IV SOLN
INTRAVENOUS | Status: DC | PRN
  Administered 2017-04-16: 30 mg via INTRAVENOUS

## 2017-04-16 MED ORDER — LACTATED RINGERS IV SOLN: INTRAVENOUS | Status: DC

## 2017-04-16 MED ORDER — CLINDAMYCIN PHOSPHATE 900 MG/50ML IV SOLN
900.0000 mg | INTRAVENOUS | Status: AC
  Administered 2017-04-16 (×2): 900 mg via INTRAVENOUS

## 2017-04-16 MED ORDER — PHENYLEPHRINE HCL 10 MG/ML IJ SOLN
INTRAMUSCULAR | Status: DC | PRN
  Administered 2017-04-16 (×2): 40 ug via INTRAVENOUS

## 2017-04-16 MED ORDER — BUPIVACAINE-EPINEPHRINE (PF) 0.5% -1:200000 IJ SOLN
INTRAMUSCULAR | Status: DC | PRN
  Administered 2017-04-16: 30 mL via PERINEURAL

## 2017-04-16 MED ORDER — MIDAZOLAM HCL 2 MG/2ML IJ SOLN
1.0000 mg | INTRAMUSCULAR | Status: DC | PRN
  Administered 2017-04-16: 2 mg via INTRAVENOUS

## 2017-04-16 MED ORDER — OXYCODONE HCL 5 MG PO TABS: 5.0000 mg | ORAL_TABLET | Freq: Four times a day (QID) | ORAL | 0 refills | Status: DC

## 2017-04-16 MED ORDER — EPHEDRINE SULFATE 50 MG/ML IJ SOLN
INTRAMUSCULAR | Status: DC | PRN
  Administered 2017-04-16 (×3): 10 mg via INTRAVENOUS

## 2017-04-16 SURGICAL SUPPLY — 60 items
BENZOIN TINCTURE PRP APPL 2/3 (GAUZE/BANDAGES/DRESSINGS) IMPLANT
BLADE MINI RND TIP GREEN BEAV (BLADE) IMPLANT
BLADE SURG 15 STRL LF DISP TIS (BLADE) ×1 IMPLANT
BLADE SURG 15 STRL SS (BLADE) ×2
BNDG COHESIVE 4X5 TAN STRL (GAUZE/BANDAGES/DRESSINGS) ×3 IMPLANT
BNDG ESMARK 4X9 LF (GAUZE/BANDAGES/DRESSINGS) ×3 IMPLANT
BNDG GAUZE ELAST 4 BULKY (GAUZE/BANDAGES/DRESSINGS) ×3 IMPLANT
CHLORAPREP W/TINT 26ML (MISCELLANEOUS) ×3 IMPLANT
CLOSURE WOUND 1/2 X4 (GAUZE/BANDAGES/DRESSINGS)
CORD BIPOLAR FORCEPS 12FT (ELECTRODE) ×3 IMPLANT
COVER BACK TABLE 60X90IN (DRAPES) ×3 IMPLANT
COVER MAYO STAND STRL (DRAPES) ×3 IMPLANT
CUFF TOURN SGL LL 18 NRW (TOURNIQUET CUFF) ×3 IMPLANT
DRAIN PENROSE 1/2X12 LTX STRL (WOUND CARE) IMPLANT
DRAIN PENROSE 1/4X12 LTX STRL (WOUND CARE) IMPLANT
DRAPE EXTREMITY T 121X128X90 (DRAPE) ×3 IMPLANT
DRAPE SURG 17X23 STRL (DRAPES) ×3 IMPLANT
DRAPE U-SHAPE 47X51 STRL (DRAPES) ×3 IMPLANT
DRSG ADAPTIC 3X8 NADH LF (GAUZE/BANDAGES/DRESSINGS) ×3 IMPLANT
ELECT REM PT RETURN 9FT ADLT (ELECTROSURGICAL) ×3
ELECTRODE REM PT RTRN 9FT ADLT (ELECTROSURGICAL) ×1 IMPLANT
GAUZE SPONGE 4X4 12PLY STRL (GAUZE/BANDAGES/DRESSINGS) ×3 IMPLANT
GAUZE SPONGE 4X4 12PLY STRL LF (GAUZE/BANDAGES/DRESSINGS) ×3 IMPLANT
GLOVE BIO SURGEON STRL SZ7.5 (GLOVE) ×3 IMPLANT
GLOVE BIOGEL PI IND STRL 7.0 (GLOVE) ×1 IMPLANT
GLOVE BIOGEL PI IND STRL 8 (GLOVE) ×1 IMPLANT
GLOVE BIOGEL PI INDICATOR 7.0 (GLOVE) ×2
GLOVE BIOGEL PI INDICATOR 8 (GLOVE) ×2
GLOVE ECLIPSE 6.5 STRL STRAW (GLOVE) ×3 IMPLANT
GOWN STRL REUS W/ TWL LRG LVL3 (GOWN DISPOSABLE) ×2 IMPLANT
GOWN STRL REUS W/TWL LRG LVL3 (GOWN DISPOSABLE) ×4
GOWN STRL REUS W/TWL XL LVL3 (GOWN DISPOSABLE) ×3 IMPLANT
LOOP VESSEL MAXI BLUE (MISCELLANEOUS) IMPLANT
NEEDLE HYPO 25X1 1.5 SAFETY (NEEDLE) ×3 IMPLANT
NS IRRIG 1000ML POUR BTL (IV SOLUTION) ×3 IMPLANT
PACK BASIN DAY SURGERY FS (CUSTOM PROCEDURE TRAY) ×3 IMPLANT
PAD CAST 4YDX4 CTTN HI CHSV (CAST SUPPLIES) ×1 IMPLANT
PADDING CAST ABS 4INX4YD NS (CAST SUPPLIES) ×2
PADDING CAST ABS COTTON 4X4 ST (CAST SUPPLIES) ×1 IMPLANT
PADDING CAST COTTON 4X4 STRL (CAST SUPPLIES) ×2
PENCIL BUTTON HOLSTER BLD 10FT (ELECTRODE) IMPLANT
SLEEVE SCD COMPRESS KNEE MED (MISCELLANEOUS) ×3 IMPLANT
SLING ARM FOAM STRAP LRG (SOFTGOODS) IMPLANT
SLING ARM FOAM STRAP XLG (SOFTGOODS) ×3 IMPLANT
SLING ARM XL FOAM STRAP (SOFTGOODS) ×3 IMPLANT
STOCKINETTE 6  STRL (DRAPES) ×2
STOCKINETTE 6 STRL (DRAPES) ×1 IMPLANT
STRIP CLOSURE SKIN 1/2X4 (GAUZE/BANDAGES/DRESSINGS) IMPLANT
SUT ETHILON 8 0 BV130 4 (SUTURE) IMPLANT
SUT VIC AB 0 SH 27 (SUTURE) IMPLANT
SUT VIC AB 2-0 SH 27 (SUTURE)
SUT VIC AB 2-0 SH 27XBRD (SUTURE) IMPLANT
SUT VIC AB 3-0 SH 27 (SUTURE) ×2
SUT VIC AB 3-0 SH 27X BRD (SUTURE) ×1 IMPLANT
SUT VICRYL RAPIDE 4/0 PS 2 (SUTURE) IMPLANT
SYR 10ML LL (SYRINGE) ×3 IMPLANT
SYR BULB 3OZ (MISCELLANEOUS) ×3 IMPLANT
TOWEL OR 17X24 6PK STRL BLUE (TOWEL DISPOSABLE) ×6 IMPLANT
TOWEL OR NON WOVEN STRL DISP B (DISPOSABLE) ×3 IMPLANT
UNDERPAD 30X30 (UNDERPADS AND DIAPERS) ×3 IMPLANT

## 2017-04-16 NOTE — Anesthesia Postprocedure Evaluation (Signed)
Anesthesia Post Note  Patient: Walter White.  Procedure(s) Performed: LEFT ULNAR NEUROPLASTY AT THE WRIST AND TRANSPOSITION AT THE ELBOW (Left )     Patient location during evaluation: PACU Anesthesia Type: General Level of consciousness: awake and alert Pain management: pain level controlled Vital Signs Assessment: post-procedure vital signs reviewed and stable Respiratory status: spontaneous breathing, nonlabored ventilation and respiratory function stable Cardiovascular status: blood pressure returned to baseline and stable Postop Assessment: no apparent nausea or vomiting Anesthetic complications: no    Last Vitals:  Vitals:   04/16/17 1345 04/16/17 1400  BP: 99/65 124/68  Pulse: 93 97  Resp: 17 15  Temp:    SpO2: 96% 94%    Last Pain:  Vitals:   04/16/17 1400  TempSrc:   PainSc: 0-No pain                 Catalina Gravel

## 2017-04-16 NOTE — Anesthesia Preprocedure Evaluation (Addendum)
Anesthesia Evaluation  Patient identified by MRN, date of birth, ID band Patient awake    Reviewed: Allergy & Precautions, NPO status , Patient's Chart, lab work & pertinent test results  History of Anesthesia Complications (+) DIFFICULT AIRWAY and history of anesthetic complications  Airway Mallampati: I  TM Distance: >3 FB Neck ROM: Full    Dental  (+) Teeth Intact, Dental Advisory Given   Pulmonary former smoker,    Pulmonary exam normal breath sounds clear to auscultation       Cardiovascular hypertension, Pt. on medications Normal cardiovascular exam Rhythm:Regular Rate:Normal     Neuro/Psych LEFT ULNAR NEUROPATHY Spinal stenosis   Neuromuscular disease    GI/Hepatic Neg liver ROS, GERD  Medicated,  Endo/Other  Obesity   Renal/GU negative Renal ROS     Musculoskeletal  (+) Arthritis , Osteoarthritis,    Abdominal   Peds  Hematology negative hematology ROS (+)   Anesthesia Other Findings Day of surgery medications reviewed with the patient.  Reproductive/Obstetrics                            Anesthesia Physical Anesthesia Plan  ASA: II  Anesthesia Plan: General   Post-op Pain Management:  Regional for Post-op pain   Induction: Intravenous  PONV Risk Score and Plan: 2 and Dexamethasone, Ondansetron and Midazolam  Airway Management Planned: LMA  Additional Equipment:   Intra-op Plan:   Post-operative Plan: Extubation in OR  Informed Consent: I have reviewed the patients History and Physical, chart, labs and discussed the procedure including the risks, benefits and alternatives for the proposed anesthesia with the patient or authorized representative who has indicated his/her understanding and acceptance.   Dental advisory given  Plan Discussed with: CRNA  Anesthesia Plan Comments:         Anesthesia Quick Evaluation

## 2017-04-16 NOTE — Interval H&P Note (Signed)
History and Physical Interval Note:  04/16/2017 9:43 AM  Walter White.  has presented today for surgery, with the diagnosis of LEFT ULNAR NEUROPATHY G56.22  The various methods of treatment have been discussed with the patient and family. After consideration of risks, benefits and other options for treatment, the patient has consented to  Procedure(s): LEFT ULNAR NEUROPLASTY AT THE WRIST AND TRANSPOSITION AT THE ELBOW (Left) as a surgical intervention .  The patient's history has been reviewed, patient examined, no change in status, stable for surgery.  I have reviewed the patient's chart and labs.  Questions were answered to the patient's satisfaction.     [name]

## 2017-04-16 NOTE — Discharge Instructions (Signed)
Discharge Instructions   You have a light dressing on your hand.  You may begin gentle motion of your fingers and hand immediately, but you should not do any heavy lifting or gripping.  Elevate your hand to reduce pain & swelling of the digits.  Ice over the operative site may be helpful to reduce pain & swelling.  DO NOT USE HEAT. Pain medicine has been prescribed for you.  Return to your regular pain medicine routine and diclofenac. Take the prescribed pain medicine additionally for severe breakthrough pain as a rescue medicine. Leave the dressing in place until the third day after your surgery and then remove it, leaving it open to air.  After the bandage has been removed you may shower, regularly washing the incision and letting the water run over it, but not submerging it (no swimming, soaking it in dishwater, etc.) You may drive a car when you are off of prescription pain medications and can safely control your vehicle with both hands. We will address whether therapy will be required or not when you return to the office. You may have already made your follow-up appointment when we completed your preop visit.  If not, please call our office today or the next business day to make your return appointment for 10-15 days after surgery.   Please call [phone] during normal business hours or [phone] after hours for any problems. Including the following:  - excessive redness of the incisions - drainage for more than 4 days - fever of more than 101.5 F  *Please note that pain medications will not be refilled after hours or on weekends.   Post Anesthesia Home Care Instructions  Activity: Get plenty of rest for the remainder of the day. A responsible individual must stay with you for 24 hours following the procedure.  For the next 24 hours, DO NOT: -Drive a car -Paediatric nurse -Drink alcoholic beverages -Take any medication unless instructed by your physician -Make any legal  decisions or sign important papers.  Meals: Start with liquid foods such as gelatin or soup. Progress to regular foods as tolerated. Avoid greasy, spicy, heavy foods. If nausea and/or vomiting occur, drink only clear liquids until the nausea and/or vomiting subsides. Call your physician if vomiting continues.  Special Instructions/Symptoms: Your throat may feel dry or sore from the anesthesia or the breathing tube placed in your throat during surgery. If this causes discomfort, gargle with warm salt water. The discomfort should disappear within 24 hours.  If you had a scopolamine patch placed behind your ear for the management of post- operative nausea and/or vomiting:  1. The medication in the patch is effective for 72 hours, after which it should be removed.  Wrap patch in a tissue and discard in the trash. Wash hands thoroughly with soap and water. 2. You may remove the patch earlier than 72 hours if you experience unpleasant side effects which may include dry mouth, dizziness or visual disturbances. 3. Avoid touching the patch. Wash your hands with soap and water after contact with the patch.

## 2017-04-16 NOTE — Transfer of Care (Signed)
Immediate Anesthesia Transfer of Care Note  Patient: Walter White.  Procedure(s) Performed: LEFT ULNAR NEUROPLASTY AT THE WRIST AND TRANSPOSITION AT THE ELBOW (Left )  Patient Location: PACU  Anesthesia Type:GA combined with regional for post-op pain  Level of Consciousness: sedated  Airway & Oxygen Therapy: Patient Spontanous Breathing and Patient connected to face mask oxygen  Post-op Assessment: Report given to RN and Post -op Vital signs reviewed and stable  Post vital signs: Reviewed and stable  Last Vitals:  Vitals Value Taken Time  BP 93/60 04/16/2017  1:04 PM  Temp 36.6 C 04/16/2017  1:05 PM  Pulse 82 04/16/2017  1:07 PM  Resp 18 04/16/2017  1:07 PM  SpO2 95 % 04/16/2017  1:07 PM  Vitals shown include unvalidated device data.  Last Pain:  Vitals:   04/16/17 0933  TempSrc: Oral  PainSc: 4       Patients Stated Pain Goal: 4 (44/96/75 9163)  Complications: No apparent anesthesia complications

## 2017-04-16 NOTE — Anesthesia Procedure Notes (Signed)
Anesthesia Regional Block: Supraclavicular block   Pre-Anesthetic Checklist: ,, timeout performed, Correct Patient, Correct Site, Correct Laterality, Correct Procedure, Correct Position, site marked, Risks and benefits discussed,  Surgical consent,  Pre-op evaluation,  At surgeon's request and post-op pain management  Laterality: Left  Prep: chloraprep       Needles:  Injection technique: Single-shot  Needle Type: Echogenic Needle     Needle Length: 9cm  Needle Gauge: 21     Additional Needles:   Procedures:,,,, ultrasound used (permanent image in chart),,,,  Narrative:  Start time: 04/16/2017 10:37 AM End time: 04/16/2017 10:43 AM Injection made incrementally with aspirations every 5 mL.  Performed by: Personally  Anesthesiologist: Catalina Gravel, MD  Additional Notes: No pain on injection. No increased resistance to injection. Injection made in 5cc increments.  Good needle visualization.  Patient tolerated procedure well.

## 2017-04-16 NOTE — Anesthesia Procedure Notes (Signed)
Procedure Name: LMA Insertion Date/Time: 04/16/2017 11:48 AM Performed by: Signe Colt, CRNA Pre-anesthesia Checklist: Patient identified, Emergency Drugs available, Suction available and Patient being monitored Patient Re-evaluated:Patient Re-evaluated prior to induction Oxygen Delivery Method: Circle system utilized Preoxygenation: Pre-oxygenation with 100% oxygen Induction Type: IV induction Ventilation: Mask ventilation without difficulty LMA: LMA inserted LMA Size: 5.0 Number of attempts: 1 Airway Equipment and Method: Bite block Placement Confirmation: positive ETCO2 Tube secured with: Tape Dental Injury: Teeth and Oropharynx as per pre-operative assessment

## 2017-04-16 NOTE — Op Note (Signed)
04/16/2017  9:52 AM  PATIENT:  Walter White.  67 y.o. 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 Grandville Silos, MD  PHYSICIAN ASSISTANT: Morley Kos, OPA-C  ANESTHESIA:  regional and general  SPECIMENS:  None  DRAINS:   None  EBL:  less than 50 mL  PREOPERATIVE INDICATIONS:  Walter White. is a  67 y.o. male with left ulnar neuropathy, having undergone previous ulnar nerve decompression in situ at the elbow  The risks benefits and alternatives were discussed with the patient preoperatively including but not limited to the risks of infection, bleeding, nerve injury, cardiopulmonary complications, the need for revision surgery, among others, and the patient verbalized understanding and consented to proceed.  OPERATIVE IMPLANTS: None  OPERATIVE PROCEDURE:  After receiving prophylactic antibiotics and a regional block, the patient was escorted to the operative theatre and placed in a supine position.  A surgical "time-out" was performed during which the planned procedure, proposed operative site, and the correct patient identity were compared to the operative consent and agreement confirmed by the circulating nurse according to current facility policy.  The exposed skin was prepped with ChloraPrep and draped in usual sterile fashion.  Sterile tourniquet was applied but not yet inflated.  The limb was exsanguinated with an Esmarch bandage and the tourniquet inflated to 250 mmHg.  The hand/wrist was addressed first.  A 3 limb zigzag incision was fashioned over the course of the ulnar nerve, from the mid palm to the distal forearm.  It crossed the wrist creases obliquely.  Careful dissection was carried down with loop assisted magnification to the level of the deep  forearm fascia.  It was split in line with the tendons within the carpal tunnel.  The fascia was split distally, 2 include an open carpal tunnel release by incising the transverse carpal ligament.  In addition, the ulnar nerve was found and identified in the distal forearm.  Sliding scissor dissection of the fascia was split proximal for a couple inches and then the nerve was meticulously dissected free distally to include independent dissection of the deep motor branch with some release of the hyperthenar fascia over top, ensuring that there was no separate compression of the nerve at that level.  Both the median nerve and the ulnar nerve and its branches were intact and attention shifted to the elbow.  Moist sponge was placed into the hand incision.  At the elbow, the previous incision was opened again, extending it proximally and distally.  Full-thickness flaps were elevated with care to protect and preserve any crossing branches of the medial antebrachial cutaneous nerve discovered.  The nerve was identified and dissected free proximally and distally.  He did want to subluxate over the medial epicondyle in that level.  It was quite irritable.  Within the FCU, both the superficial and deep fascia were split in 1 of the branches to the FCU required some independent dissection to free it so that it could accompany the major nerve during transposition.  The medial intermuscular septum above the elbow was excised for several centimeters to allow for transposition of the nerve without kinking or bending.  The nerve was now dissected free and placed into a nice subcutaneous pocket created on the surface of the flexor pronator mass.  A fascial flap was elevated  off of the medial epicondyle, and it was secured to the superficial fascia of the skin overlying it provide a barrier to posterior subluxation.  It was secured with 3-0 Vicryl interrupted suture.  All the wounds were copiously irrigated and the tourniquet  released.  Some additional hemostasis was obtained with direct pressure and bipolar electrocautery and the elbow incision was closed first.  The skin was reapproximated with 3-0 Vicryl interrupted deep dermal subcuticular sutures followed by running horizontal mattress 4-0 Vicryl Rapide sutures.  Interrupted 4-0 Vicryl Rapide was used to close the distal incision and a bulky dressing was applied.  He was awakened and taken to the recovery room in stable condition, breathing spontaneously.  DISPOSITION: He will be discharged home today with  typical  instructions, returning in 10-15 days.

## 2017-04-16 NOTE — Progress Notes (Signed)
Assisted Dr. Turk with left, ultrasound guided, supraclavicular block. Side rails up, monitors on throughout procedure. See vital signs in flow sheet. Tolerated Procedure well. 

## 2017-04-17 ENCOUNTER — Encounter (HOSPITAL_BASED_OUTPATIENT_CLINIC_OR_DEPARTMENT_OTHER): Payer: Self-pay | Admitting: Orthopedic Surgery

## 2017-04-26 DIAGNOSIS — G5622 Lesion of ulnar nerve, left upper limb: Secondary | ICD-10-CM | POA: Diagnosis not present

## 2017-04-30 DIAGNOSIS — E1122 Type 2 diabetes mellitus with diabetic chronic kidney disease: Secondary | ICD-10-CM | POA: Diagnosis not present

## 2017-04-30 DIAGNOSIS — K219 Gastro-esophageal reflux disease without esophagitis: Secondary | ICD-10-CM | POA: Diagnosis not present

## 2017-04-30 DIAGNOSIS — I10 Essential (primary) hypertension: Secondary | ICD-10-CM | POA: Diagnosis not present

## 2017-05-16 DIAGNOSIS — M25562 Pain in left knee: Secondary | ICD-10-CM | POA: Diagnosis not present

## 2017-06-06 DIAGNOSIS — M1711 Unilateral primary osteoarthritis, right knee: Secondary | ICD-10-CM | POA: Diagnosis not present

## 2017-06-06 DIAGNOSIS — M25562 Pain in left knee: Secondary | ICD-10-CM | POA: Diagnosis not present

## 2017-06-22 ENCOUNTER — Other Ambulatory Visit: Payer: Self-pay | Admitting: Orthopedic Surgery

## 2017-06-22 DIAGNOSIS — M4696 Unspecified inflammatory spondylopathy, lumbar region: Secondary | ICD-10-CM | POA: Diagnosis not present

## 2017-06-27 DIAGNOSIS — M1711 Unilateral primary osteoarthritis, right knee: Secondary | ICD-10-CM | POA: Diagnosis not present

## 2017-06-28 DIAGNOSIS — H04123 Dry eye syndrome of bilateral lacrimal glands: Secondary | ICD-10-CM | POA: Diagnosis not present

## 2017-06-28 DIAGNOSIS — H25813 Combined forms of age-related cataract, bilateral: Secondary | ICD-10-CM | POA: Diagnosis not present

## 2017-07-04 DIAGNOSIS — M1711 Unilateral primary osteoarthritis, right knee: Secondary | ICD-10-CM | POA: Diagnosis not present

## 2017-07-16 ENCOUNTER — Ambulatory Visit
Admission: RE | Admit: 2017-07-16 | Discharge: 2017-07-16 | Disposition: A | Discharge status: Home / Self Care | Payer: Medicare Other | Source: Ambulatory Visit | Attending: Orthopedic Surgery | Admitting: Orthopedic Surgery

## 2017-07-16 DIAGNOSIS — M4696 Unspecified inflammatory spondylopathy, lumbar region: Secondary | ICD-10-CM

## 2017-07-16 DIAGNOSIS — M545 Low back pain: Secondary | ICD-10-CM | POA: Diagnosis not present

## 2017-07-17 DIAGNOSIS — M1711 Unilateral primary osteoarthritis, right knee: Secondary | ICD-10-CM | POA: Diagnosis not present

## 2017-07-20 DIAGNOSIS — M4696 Unspecified inflammatory spondylopathy, lumbar region: Secondary | ICD-10-CM | POA: Diagnosis not present

## 2017-07-20 DIAGNOSIS — M545 Low back pain: Secondary | ICD-10-CM | POA: Diagnosis not present

## 2017-07-25 ENCOUNTER — Telehealth: Payer: Self-pay | Admitting: Vascular Surgery

## 2017-07-25 NOTE — Telephone Encounter (Signed)
sch appt spk to pt wife 08/21/17 130pm ALIF f/u MD

## 2017-07-27 ENCOUNTER — Other Ambulatory Visit: Payer: Self-pay | Admitting: *Deleted

## 2017-07-31 DIAGNOSIS — M25562 Pain in left knee: Secondary | ICD-10-CM | POA: Diagnosis not present

## 2017-08-07 DIAGNOSIS — G5622 Lesion of ulnar nerve, left upper limb: Secondary | ICD-10-CM | POA: Diagnosis not present

## 2017-08-08 ENCOUNTER — Other Ambulatory Visit: Payer: Self-pay | Admitting: Orthopedic Surgery

## 2017-08-08 ENCOUNTER — Other Ambulatory Visit: Payer: Self-pay | Admitting: *Deleted

## 2017-08-15 DIAGNOSIS — M25871 Other specified joint disorders, right ankle and foot: Secondary | ICD-10-CM | POA: Diagnosis not present

## 2017-08-15 DIAGNOSIS — T8482XD Fibrosis due to internal orthopedic prosthetic devices, implants and grafts, subsequent encounter: Secondary | ICD-10-CM | POA: Diagnosis not present

## 2017-08-15 DIAGNOSIS — M93271 Osteochondritis dissecans, right ankle and joints of right foot: Secondary | ICD-10-CM | POA: Diagnosis not present

## 2017-08-15 DIAGNOSIS — Z96652 Presence of left artificial knee joint: Secondary | ICD-10-CM | POA: Diagnosis not present

## 2017-08-21 ENCOUNTER — Ambulatory Visit (INDEPENDENT_AMBULATORY_CARE_PROVIDER_SITE_OTHER): Payer: Medicare Other | Admitting: Vascular Surgery

## 2017-08-21 ENCOUNTER — Encounter: Payer: Self-pay | Admitting: Vascular Surgery

## 2017-08-21 VITALS — BP 119/77 | HR 82 | Temp 97.0°F | Resp 18 | Ht 67.0 in | Wt 240.0 lb

## 2017-08-21 DIAGNOSIS — M5137 Other intervertebral disc degeneration, lumbosacral region: Secondary | ICD-10-CM

## 2017-08-21 NOTE — Progress Notes (Signed)
[facility] [place]  Patient name: Walter White. MRN: [id number] DOB: June 11, 1950 Sex: male  REASON FOR VISIT: Discuss anterior exposure for disc surgery  HPI: Walter White. is a 67 y.o. male here today for discussion of anterior exposure for L5-S1 disc fusion.  He has been seen by Dr. Phylliss Bob and has had recommendation for multilevel fusion.  It is been recommended that he undergo L5-S1 fusion from the front.  He was seen by Dr. 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  He is scheduled for repeat ultrasound to confirm no increase in size.  This is been scheduled for tomorrow.  He is never had any intra-abdominal surgery.  No history of peripheral vascular occlusive disease.  He does have a prior multilevel instrumentation of his back and has had recurrent difficulty  Past Medical History:  Diagnosis Date  . Allergy    Allergic Rhinitis  . Anticipated difficulty with intubation   . Arthritis    spinal steniosis  . At risk for difficult intubation    no trouble intubating as far as pt knows- swelling afterward -above note previous  . Benign prostate hyperplasia    with lower urinary tract symptoms  . Carpal tunnel syndrome on right   . Chronic back pain    caused by spinal stenosis  . 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 Elevated PSA   . GERD (gastroesophageal reflux disease)   . Hyperlipidemia   . Hypertension    Essential.    . Microhematuria   . OA (osteoarthritis) of knee   . Pneumonia    Hx -when very young  . Pre-diabetes   . Seasonal allergies   . Spinal stenosis    of lumbosacral region.  Causes chronic back pain.  [name] Upper abdominal pain     Family History  Problem Relation Age of Onset  . Cancer Mother   . Liver disease Father   . Colon cancer Neg Hx     . Colon polyps Neg Hx   . Esophageal cancer Neg Hx   . Rectal cancer Neg Hx   . Stomach cancer Neg Hx     SOCIAL HISTORY: Social History   Tobacco Use  . Smoking status: Former Smoker    Last attempt to quit: 11/21/1980    Years since quitting: 36.7  . Smokeless tobacco: Never Used  Substance Use Topics  . Alcohol use: Yes    Alcohol/week: 0.0 standard drinks    Comment: Very rare    Allergies  Allergen Reactions  . Lactose Intolerance (Gi)     Dairy--allergic per allergy test  . Lovastatin   . Penicillins     Per pt: unknown Has patient had a PCN reaction causing immediate rash, facial/tongue/throat swelling, SOB or lightheadedness with hypotension: Unknown Has patient had a PCN reaction causing severe rash involving mucus membranes or skin necrosis: Unknown Has patient had a PCN reaction that required hospitalization: Unknown Has patient had a PCN reaction occurring within the last 10 years: No If all of the above answers are "NO", then may proceed with Cephalosporin use.   . Pork-Derived Products Other (See Comments)    Per allergy test  . Shellfish Allergy Other (See Comments)    Per allergy test     Current Outpatient Medications  Medication Sig Dispense Refill  . diclofenac (VOLTAREN) 75 MG EC tablet 75 mg 2 (two) times daily.     [name] docusate sodium (COLACE) 100 MG capsule Take 100 mg by mouth 2 (two) times daily.    . fluticasone (FLONASE) 50 MCG/ACT nasal spray Place 1 spray into both nostrils daily as needed. For allergies    . HYDROcodone-acetaminophen (NORCO/VICODIN) 5-325 MG tablet     . lisinopril (PRINIVIL,ZESTRIL) 10 MG tablet Take 10 mg by mouth at bedtime.     . Nutritional Supplements (JUICE PLUS FIBRE PO) Take 6 capsules by mouth daily. Juice Plus+ Orchard Blend 2 capsules in the morning Juice Plus+ Garden Blend 2 capsules in the morning Juice Plus+ Vineyard Blend 2 capsule in the morning    . oxyCODONE (ROXICODONE) 5 MG immediate release tablet  Take 1 tablet (5 mg total) by mouth every 6 (six) hours. 15 tablet 0  . Pantoprazole Sodium POWD 40 mg by Does not apply route. 1 packet orally.    . rosuvastatin (CRESTOR) 5 MG tablet Take 5 mg by mouth daily.    . Tamsulosin HCl (FLOMAX) 0.4 MG CAPS Take 0.4 mg by mouth at bedtime.     . traMADol (ULTRAM) 50 MG tablet Take 50 mg by mouth every 6 (six) hours as needed.    . methocarbamol (ROBAXIN) 500 MG tablet Take 750 mg by mouth 4 (four) times daily.      No current facility-administered medications for this visit.     REVIEW OF SYSTEMS:  [X]  denotes positive finding, [ ]  denotes negative finding Cardiac  Comments:  Chest pain or chest pressure:    Shortness of breath upon exertion: x   Short of breath when lying flat: x   Irregular heart rhythm:        Vascular    Pain in calf, thigh, or hip brought on by ambulation: x   Pain in feet at night that wakes you up from your sleep:  x   Blood clot in your veins:    Leg swelling:  x         PHYSICAL EXAM: Vitals:   08/21/17 1357  BP: 119/77  Pulse: 82  Resp: 18  Temp: (!) 97 F (36.1 C)  TempSrc: Oral  SpO2: 94%  Weight: 240 lb (108.9 kg)  Height: 5\' 7"  (1.702 m)    GENERAL: The patient is a well-nourished male, in no acute distress. The vital signs are documented above. CARDIOVASCULAR: 2+ radial and 2+ dorsalis pedis pulses bilaterally Abdomen obese.  No surgical scars.  No palpable aneurysm. PULMONARY: There is good air exchange  MUSCULOSKELETAL: There are no major deformities or cyanosis. NEUROLOGIC: No focal weakness or paresthesias are detected. SKIN: There are no ulcers or rashes noted. PSYCHIATRIC: The patient has a normal affect.  DATA:  I did review a recent CT scan from July of this year with pelvic lumbar views.  This showed no evidence of calcification or aneurysm in his iliac vessels.  I cannot find any imaging in the [facility] system of his aorta.  MEDICAL ISSUES: I discussed my role in anterior exposure  with mobilization of the rectus muscle, intraperitoneal contents, left ureter, arterial and venous structures overlying the spine and the potential risks and injury to all of these.  Will visualize his aorta with ultrasound tomorrow.  I do not feel there will be any contraindication to the L5-S1 level since his CT scan shows no evidence of iliac disease.  Plan for surgery  as directed on 09/05/2017    Rosetta Posner, MD FACS [facility] Tel [phone] Pager [phone]

## 2017-08-22 ENCOUNTER — Ambulatory Visit (HOSPITAL_COMMUNITY)
Admission: RE | Admit: 2017-08-22 | Discharge: 2017-08-22 | Disposition: A | Discharge status: Home / Self Care | Payer: Medicare Other | Source: Ambulatory Visit | Attending: Vascular Surgery | Admitting: Vascular Surgery

## 2017-08-22 DIAGNOSIS — I714 Abdominal aortic aneurysm, without rupture, unspecified: Secondary | ICD-10-CM

## 2017-08-24 DIAGNOSIS — T8482XD Fibrosis due to internal orthopedic prosthetic devices, implants and grafts, subsequent encounter: Secondary | ICD-10-CM | POA: Diagnosis not present

## 2017-08-27 ENCOUNTER — Ambulatory Visit: Payer: Medicare Other

## 2017-08-27 NOTE — Pre-Procedure Instructions (Signed)
Walter White.  08/27/2017      [place], [place] [facility] [address] [place] [address] Phone: [phone] Fax: [phone]    Your procedure is scheduled on Wednesday August 28.  Report to [facility] Admitting at 5:30 A.M.  Call this number if you have problems the morning of surgery:  [phone]   Remember:  Do not eat or drink after midnight.    Take these medicines the morning of surgery with A SIP OF WATER:   Pantoprazole (protonix) Tramadol (ultram) if needed Eye drops Flonase if needed  7 days prior to surgery STOP taking any Aspirin(unless otherwise instructed by your surgeon), Aleve, Naproxen, Ibuprofen, Motrin, Advil, Goody's, BC's, all herbal medications, fish oil, and all vitamins      Do not wear jewelry, make-up or nail polish.  Do not wear lotions, powders, or perfumes, or deodorant.  Do not shave 48 hours prior to surgery.  Men may shave face and neck.  Do not bring valuables to the hospital.  [facility] is not responsible for any belongings or valuables.  Contacts, dentures or bridgework may not be worn into surgery.  Leave your suitcase in the car.  After surgery it may be brought to your room.  For patients admitted to the hospital, discharge time will be determined by your treatment team.  Patients discharged the day of surgery will not be allowed to drive home.   Special instructions:    [place]- Preparing For Surgery  Before surgery, you can play an important role. Because skin is not sterile, your skin needs to be as free of germs as possible. You can reduce the number of germs on your skin by washing with CHG (chlorahexidine gluconate) Soap before surgery.  CHG is an antiseptic cleaner which kills germs and bonds with the skin to continue killing germs even after washing.    Oral Hygiene is also important to reduce your risk of infection.  Remember - BRUSH YOUR TEETH THE  MORNING OF SURGERY WITH YOUR REGULAR TOOTHPASTE  Please do not use if you have an allergy to CHG or antibacterial soaps. If your skin becomes reddened/irritated stop using the CHG.  Do not shave (including legs and underarms) for at least 48 hours prior to first CHG shower. It is OK to shave your face.  Please follow these instructions carefully.   1. Shower the NIGHT BEFORE SURGERY and the MORNING OF SURGERY with CHG.   2. If you chose to wash your hair, wash your hair first as usual with your normal shampoo.  3. After you shampoo, rinse your hair and body thoroughly to remove the shampoo.  4. Use CHG as you would any other liquid soap. You can apply CHG directly to the skin and wash gently with a scrungie or a clean washcloth.   5. Apply the CHG Soap to your body ONLY FROM THE NECK DOWN.  Do not use on open wounds or open sores. Avoid contact with your eyes, ears, mouth and genitals (private parts). Wash Face and genitals (private parts)  with your normal soap.  6. Wash thoroughly, paying special attention to the area where your surgery will be performed.  7. Thoroughly rinse your body with warm water from the neck down.  8. DO NOT shower/wash with your normal soap after using and rinsing off the CHG Soap.  9. Pat yourself dry with a CLEAN TOWEL.  10. Wear  CLEAN PAJAMAS to bed the night before surgery, wear comfortable clothes the morning of surgery  11. Place CLEAN SHEETS on your bed the night of your first shower and DO NOT SLEEP WITH PETS.    Day of Surgery:  Do not apply any deodorants/lotions.  Please wear clean clothes to the hospital/surgery center.   Remember to brush your teeth WITH YOUR REGULAR TOOTHPASTE.    Please read over the following fact sheets that you were given. Coughing and Deep Breathing, MRSA Information and Surgical Site Infection Prevention

## 2017-08-28 ENCOUNTER — Ambulatory Visit (HOSPITAL_COMMUNITY)
Admission: RE | Admit: 2017-08-28 | Discharge: 2017-08-28 | Disposition: A | Discharge status: Home / Self Care | Payer: Medicare Other | Source: Ambulatory Visit | Attending: Orthopedic Surgery | Admitting: Orthopedic Surgery

## 2017-08-28 ENCOUNTER — Encounter (HOSPITAL_COMMUNITY)
Admission: RE | Admit: 2017-08-28 | Discharge: 2017-08-28 | Disposition: A | Discharge status: Home / Self Care | Payer: Medicare Other | Source: Ambulatory Visit | Attending: Orthopedic Surgery | Admitting: Orthopedic Surgery

## 2017-08-28 ENCOUNTER — Other Ambulatory Visit: Payer: Self-pay

## 2017-08-28 ENCOUNTER — Encounter (HOSPITAL_COMMUNITY): Payer: Self-pay

## 2017-08-28 DIAGNOSIS — Z01812 Encounter for preprocedural laboratory examination: Secondary | ICD-10-CM | POA: Diagnosis not present

## 2017-08-28 DIAGNOSIS — I700 Atherosclerosis of aorta: Secondary | ICD-10-CM | POA: Insufficient documentation

## 2017-08-28 DIAGNOSIS — I10 Essential (primary) hypertension: Secondary | ICD-10-CM | POA: Insufficient documentation

## 2017-08-28 DIAGNOSIS — I714 Abdominal aortic aneurysm, without rupture: Secondary | ICD-10-CM | POA: Diagnosis not present

## 2017-08-28 DIAGNOSIS — Z79899 Other long term (current) drug therapy: Secondary | ICD-10-CM | POA: Diagnosis not present

## 2017-08-28 DIAGNOSIS — Z01818 Encounter for other preprocedural examination: Secondary | ICD-10-CM

## 2017-08-28 DIAGNOSIS — Z981 Arthrodesis status: Secondary | ICD-10-CM | POA: Diagnosis not present

## 2017-08-28 DIAGNOSIS — M1710 Unilateral primary osteoarthritis, unspecified knee: Secondary | ICD-10-CM | POA: Insufficient documentation

## 2017-08-28 DIAGNOSIS — G8929 Other chronic pain: Secondary | ICD-10-CM | POA: Insufficient documentation

## 2017-08-28 DIAGNOSIS — M545 Low back pain: Secondary | ICD-10-CM | POA: Insufficient documentation

## 2017-08-28 DIAGNOSIS — M4807 Spinal stenosis, lumbosacral region: Secondary | ICD-10-CM | POA: Diagnosis not present

## 2017-08-28 DIAGNOSIS — E785 Hyperlipidemia, unspecified: Secondary | ICD-10-CM | POA: Insufficient documentation

## 2017-08-28 DIAGNOSIS — Z87891 Personal history of nicotine dependence: Secondary | ICD-10-CM | POA: Diagnosis not present

## 2017-08-28 DIAGNOSIS — R7303 Prediabetes: Secondary | ICD-10-CM | POA: Insufficient documentation

## 2017-08-28 DIAGNOSIS — Z79891 Long term (current) use of opiate analgesic: Secondary | ICD-10-CM | POA: Insufficient documentation

## 2017-08-28 HISTORY — DX: Abdominal aortic aneurysm, without rupture, unspecified: I71.40

## 2017-08-28 HISTORY — DX: Abdominal aortic aneurysm, without rupture: I71.4

## 2017-08-28 HISTORY — DX: Unspecified cataract: H26.9

## 2017-08-28 LAB — CBC WITH DIFFERENTIAL/PLATELET
Abs Immature Granulocytes: 0 10*3/uL (ref 0.0–0.1)
Basophils Absolute: 0 10*3/uL (ref 0.0–0.1)
Basophils Relative: 1 %
EOS PCT: 3 %
Eosinophils Absolute: 0.1 10*3/uL (ref 0.0–0.7)
HEMATOCRIT: 40.1 % (ref 39.0–52.0)
HEMOGLOBIN: 13.1 g/dL (ref 13.0–17.0)
IMMATURE GRANULOCYTES: 0 %
LYMPHS ABS: 0.7 10*3/uL (ref 0.7–4.0)
LYMPHS PCT: 18 %
MCH: 30.8 pg (ref 26.0–34.0)
MCHC: 32.7 g/dL (ref 30.0–36.0)
MCV: 94.4 fL (ref 78.0–100.0)
Monocytes Absolute: 0.3 10*3/uL (ref 0.1–1.0)
Monocytes Relative: 6 %
Neutro Abs: 3.1 10*3/uL (ref 1.7–7.7)
Neutrophils Relative %: 72 %
Platelets: 166 10*3/uL (ref 150–400)
RBC: 4.25 MIL/uL (ref 4.22–5.81)
RDW: 12.7 % (ref 11.5–15.5)
WBC: 4.2 10*3/uL (ref 4.0–10.5)

## 2017-08-28 LAB — COMPREHENSIVE METABOLIC PANEL
ALK PHOS: 79 U/L (ref 38–126)
ALT: 40 U/L (ref 0–44)
AST: 40 U/L (ref 15–41)
Albumin: 4.1 g/dL (ref 3.5–5.0)
Anion gap: 6 (ref 5–15)
BILIRUBIN TOTAL: 0.9 mg/dL (ref 0.3–1.2)
BUN: 39 mg/dL — AB (ref 8–23)
CALCIUM: 9.4 mg/dL (ref 8.9–10.3)
CO2: 25 mmol/L (ref 22–32)
Chloride: 106 mmol/L (ref 98–111)
Creatinine, Ser: 1.58 mg/dL — ABNORMAL HIGH (ref 0.61–1.24)
GFR calc Af Amer: 50 mL/min — ABNORMAL LOW (ref >60)
GFR calc non Af Amer: 43 mL/min — ABNORMAL LOW (ref >60)
GLUCOSE: 175 mg/dL — AB (ref 70–99)
Potassium: 3.9 mmol/L (ref 3.5–5.1)
Sodium: 137 mmol/L (ref 135–145)
TOTAL PROTEIN: 8.6 g/dL — AB (ref 6.5–8.1)

## 2017-08-28 LAB — SURGICAL PCR SCREEN
MRSA, PCR: NEGATIVE
Staphylococcus aureus: NEGATIVE

## 2017-08-28 LAB — URINALYSIS, ROUTINE W REFLEX MICROSCOPIC
BILIRUBIN URINE: NEGATIVE
Glucose, UA: NEGATIVE mg/dL
Hgb urine dipstick: NEGATIVE
Ketones, ur: NEGATIVE mg/dL
Leukocytes, UA: NEGATIVE
NITRITE: NEGATIVE
PROTEIN: NEGATIVE mg/dL
SPECIFIC GRAVITY, URINE: 1.017 (ref 1.005–1.030)
pH: 5 (ref 5.0–8.0)

## 2017-08-28 LAB — TYPE AND SCREEN
ABO/RH(D): O POS
Antibody Screen: NEGATIVE

## 2017-08-28 LAB — HEMOGLOBIN A1C
HEMOGLOBIN A1C: 6.2 % — AB (ref 4.8–5.6)
MEAN PLASMA GLUCOSE: 131.24 mg/dL

## 2017-08-28 LAB — APTT: aPTT: 28 seconds (ref 24–36)

## 2017-08-28 LAB — PROTIME-INR
INR: 1.06
Prothrombin Time: 13.7 seconds (ref 11.4–15.2)

## 2017-08-28 NOTE — Progress Notes (Signed)
   08/28/17 0911  OBSTRUCTIVE SLEEP APNEA  Have you ever been diagnosed with sleep apnea through a sleep study? No  Do you snore loudly (loud enough to be heard through closed doors)?  0  Do you often feel tired, fatigued, or sleepy during the daytime (such as falling asleep during driving or talking to someone)? 0 (FROM ALL HIS RECENT MEDICATIONS, BUT NOT NORMALLY)  Has anyone observed you stop breathing during your sleep? 0  Do you have, or are you being treated for high blood pressure? 1  BMI more than 35 kg/m2? 1  Age > 50 (1-yes) 1  Neck circumference greater than:Male 16 inches or larger, Male 17inches or larger? 1  Male Gender (Yes=1) 1  Obstructive Sleep Apnea Score 5  Score 5 or greater  Results sent to PCP

## 2017-08-28 NOTE — Pre-Procedure Instructions (Signed)
Walter White.  08/28/2017      [place], [address] [facility] [address] [place] [address] Phone: [phone] Fax: [phone]    Your procedure is scheduled on Wednesday August 28.   Report to [facility] Admitting at 5:30 A.M.             (posted surgery time 7:30a - 1:21p)   Call this number if you have problems the morning of surgery:  [phone]   Remember:   Do not eat any foods or drink any liquids after midnight, Tuesday.    Take these medicines the morning of surgery with A SIP OF WATER:   Pantoprazole (protonix) Tramadol (ultram) if needed Eye drops Flonase if needed  7 days prior to surgery STOP taking any Aspirin(unless otherwise instructed by your surgeon), Aleve, Naproxen, Ibuprofen, Motrin, Advil, Goody's, BC's, all herbal medications, fish oil, and all vitamins      Do not wear jewelry - no rings or watches.  Do not wear lotions, colognes or deodorant.   Men may shave face and neck.  Do not bring valuables to the hospital.  [facility] is not responsible for any belongings or valuables.  Contacts, dentures or bridgework may not be worn into surgery.  Leave your suitcase in the car.  After surgery it may be brought to your room.  For patients admitted to the hospital, discharge time will be determined by your treatment team.     Special instructions:    [place]- Preparing For Surgery  Before surgery, you can play an important role. Because skin is not sterile, your skin needs to be as free of germs as possible. You can reduce the number of germs on your skin by washing with CHG (chlorahexidine gluconate) Soap before surgery.  CHG is an antiseptic cleaner which kills germs and bonds with the skin to continue killing germs even after washing.    Oral Hygiene is also important to reduce your risk of infection.    Remember - BRUSH YOUR TEETH THE MORNING OF SURGERY WITH YOUR REGULAR  TOOTHPASTE  Please do not use if you have an allergy to CHG or antibacterial soaps. If your skin becomes reddened/irritated stop using the CHG.  Do not shave (including legs and underarms) for at least 48 hours prior to first CHG shower. It is OK to shave your face.  Please follow these instructions carefully.   1. Shower the NIGHT BEFORE SURGERY and the MORNING OF SURGERY with CHG.   2. If you chose to wash your hair, wash your hair first as usual with your normal shampoo.  3. After you shampoo, rinse your hair and body thoroughly to remove the shampoo.  4. Use CHG as you would any other liquid soap. You can apply CHG directly to the skin and wash gently with a scrungie or a clean washcloth.   5. Apply the CHG Soap to your body ONLY FROM THE NECK DOWN.  Do not use on open wounds or open sores. Avoid contact with your eyes, ears, mouth and genitals (private parts). Wash Face and genitals (private parts)  with your normal soap.  6. Wash thoroughly, paying special attention to the area where your surgery will be performed.  7. Thoroughly rinse your body with warm water from the neck down.  8. DO NOT shower/wash with your normal soap after using and rinsing off the CHG Soap.  9. Pat yourself dry  with a CLEAN TOWEL.  10. Wear CLEAN PAJAMAS to bed the night before surgery, wear comfortable clothes the morning of surgery  11. Place CLEAN SHEETS on your bed the night of your first shower and DO NOT SLEEP WITH PETS.  Day of Surgery:  Do not apply any deodorants/lotions.  Please wear clean clothes to the hospital/surgery center.   Remember to brush your teeth WITH YOUR REGULAR TOOTHPASTE.  Please read over the following fact sheets that you were given. MRSA Information and Surgical Site Infection Prevention

## 2017-08-28 NOTE — Progress Notes (Signed)
PCP is Dr. Audie Pinto  LOV 01/2017 Denies murmur, cp, sob.  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  Recently had ultrasound to evaluate size.  (had seen Dr. Donzetta Matters and recently Dr. Donnetta Hutching) Back in the late 80's/90's stated he had difficult intubation, but subsequent surgeries have been uneventful.

## 2017-08-28 NOTE — Progress Notes (Addendum)
Anesthesia Chart Review:  Case:  [id number] Date/Time:  09/05/17 0715   Procedures:      LUMBAR 5 - SACRUM 1 ANTERIOR LUMBAR INTERBODY FUSION WITH INSTRUMENTATION AND ALLOGRAFT (N/A )     ABDOMINAL EXPOSURE (N/A )   Anesthesia type:  General   Pre-op diagnosis:  LOW BACK PAIN   Location:  [facility] ROOM 05 / [facility]   Surgeon:  Phylliss Bob, MD; Early, Arvilla Meres, MD    He is also scheduled for L5-S1 posterior spinal fusion with instrumentation and allograft on 09/06/17. First surgery is scheduled for approximately 6 hours. Second surgery is scheduled for approximately 4 hours 45 minutes.   DISCUSSION: Patient is a 67 year old male scheduled for the above procedures.   History includes  former smoker (quit '82), HTN, HLD, pre-diabetes, AAA (4 cm), chronic back pain, back surgeries (L4-5 PLIF 01/20/15, L3-4 left lateral fusion 01/26/16).  History of positive OSA screening tool. For anesthesia history, he reported that his throat swelled after ETT was inserted in the 1980's or 1990's, but has not had any difficultly since.   Most recent anesthesia records: 01/27/16: 01/20/15:  Intubation Type: IV induction Ventilation: Mask ventilation without difficulty Laryngoscope Size: Glidescope Grade View: Grade I Tube type: Oral Number of attempts: 1 Airway Equipment and Method: Stylet,  Oral airway and Video-laryngoscopy Intubation Type:IV induction and Cricoid Pressure applied Ventilation:Mask ventilation without difficulty and Oral airway inserted - appropriate to patient size Laryngoscope Size:Mac and 4 Grade View:Grade I Tube type:Oral Tube size:8.0 mm Number of attempts:1 Airway Equipment and Method: Rigid stylet   Preoperative labs show a newly elevated creatinine. Lab Results  Component Value Date   CREATININE 1.58 (H)  (BUN 39) 08/28/2017   CREATININE 0.98        (BUN 33) 04/10/2017   CREATININE 0.63        (BUN 17) 01/19/2016   Reviewed with anesthesiologist Renold Don, MD. Agree that  patient will need further evaluation by his PCP prior to surgery given newly elevated Creatinine, particularly given upcoming back to back lumbar fusion surgeries. I called and spoke with patient and discussed elevation in his renal function since April. He reported knee arthroscopic surgery on earlier this month and left ulnar surgery in 04/2017. He denied edema or urinary retention. I will route his labs to Dr. Shelia Media, and he will call for follow-up. I told him that we would want his BMET rechecked prior to the day of surgery. I will follow-up with Dr. Pennie Banter office regarding recommendations. Voice message left with Butch Penny at Dr. Laurena Bering office.  ADDENDUM 08/31/17 1:38 PM: I received a call from Sonia Baller at Dr. Pennie Banter office. Dr. Shelia Media had patient hold diclofenac and rechecked labs today. Creatinine down to 1.1. They will fax labs. If no acute changes then I anticipate that he can proceed as planned.   VS: BP 139/81   Pulse 93   Temp 36.8 C (Oral)   Resp 18   Ht _0  (1.702 m)   Wt 106.3 kg   SpO2 99%   BMI 36.71 kg/m   PROVIDERS: Deland Pretty, MD is listed as PCP. Reportedly last office visit ~ 01/2017.  Servando Snare, MD is vascular surgeon. Next visit 09/03/17 for AAA follow-up, measured 4 cm this month.   LABS: Preoperative labs reviewed CBC and PT/PTT WNL, but newly elevated creatinine of 1.58 (up from 0.98 on 04/10/17). BUN 39, previously 33 on 04/10/17. A1c added due to pre-diabetes history and random glucose >  150--result was A1c of 6.2.  (all labs ordered are listed, but only abnormal results are displayed)  Labs Reviewed  COMPREHENSIVE METABOLIC PANEL - Abnormal; Notable for the following components:      Result Value   Glucose, Bld 175 (*)    BUN 39 (*)    Creatinine, Ser 1.58 (*)    Total Protein 8.6 (*)    GFR calc non Af Amer 43 (*)    GFR calc Af Amer 50 (*)    All other components within normal limits  HEMOGLOBIN A1C - Abnormal; Notable for the following components:    Hgb A1c MFr Bld 6.2 (*)    All other components within normal limits  SURGICAL PCR SCREEN  APTT  CBC WITH DIFFERENTIAL/PLATELET  PROTIME-INR  URINALYSIS, ROUTINE W REFLEX MICROSCOPIC  TYPE AND SCREEN   REPEAT BMET 08/30/17 [facility]): Glucose 185, Creatinine 1.1, BUN 28, eGFR 67.68, K 4.5, Cl 101, CO2 26, Ca 9.8, Na 135.   IMAGES: CXR 08/28/17: IMPRESSION: Mild chronic bronchitic-smoking related changes, stable. No acute cardiopulmonary abnormality. Thoracic aortic atherosclerosis.   EKG: 04/10/17: NSR.    CV: AAA U/S 08/22/17: Final Interpretation: Abdominal Aorta: There is evidence of abnormal dilitation of the Proximal Abdominal aorta. The largest aortic measurement is 4.0 cm. Previous diameter measurement was 4.0 cm. Technically limited by body habitus and bowel gas.   Past Medical History:  Diagnosis Date  . AAA (abdominal aortic aneurysm) (HCC)    4.0 cm AAA 08/22/17 U/S  . Allergy    Allergic Rhinitis  . Anticipated difficulty with intubation   . Arthritis    spinal steniosis  . At risk for difficult intubation    no trouble intubating as far as pt knows- swelling afterward -above note previous  . Benign prostate hyperplasia    with lower urinary tract symptoms  . Carpal tunnel syndrome on right   . Cataracts, both eyes   . Chronic back pain    caused by spinal stenosis  . 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 Elevated PSA   . GERD (gastroesophageal reflux disease)   . Hyperlipidemia   . Hypertension    Essential.    . Microhematuria   . OA (osteoarthritis) of knee   . Pneumonia    Hx -when very young  . Pre-diabetes   . Seasonal allergies   . Spinal stenosis    of lumbosacral region.  Causes chronic back pain.  [name] Upper abdominal pain     Past Surgical History:  Procedure Laterality Date  . ANTERIOR LAT LUMBAR FUSION Left 01/26/2016    Procedure: LEFT SIDED LATERAL INTERBODY FUSION, LUMBAR 3-4 WITH INSTRUMENTATION AND ALLOGRAFT;  Surgeon: Phylliss Bob, MD;  Location: [facility];  Service: Orthopedics;  Laterality: Left;  LEFT SIDED LATERAL INTERBODY FUSION, LUMBAR 3-4 WITH INSTRUMENTATION AND ALLOGRAFT  . BACK SURGERY  01/2015   dr. Lynann Bologna. [phone].  [name] CARPAL TUNNEL RELEASE     right   . COLONOSCOPY    . COLONOSCOPY  2010   polyp  . ELBOW SURGERY     right ulnar release  . ELBOW SURGERY     LEFT CTR  . JOINT REPLACEMENT     left- Sept 2016  . KNEE ARTHROSCOPY  11/23/2010   Procedure: ARTHROSCOPY KNEE;  Surgeon: Kerin Salen;  Location: [facility];  Service: Orthopedics;  Laterality: Left;  arthroscopy left knee  . KNEE ARTHROSCOPY     left x 2  . Knee scar tissue removal Left 2018  . LATERAL FUSION LUMBAR SPINE  01./17/2018  . polypectomy  2010  . SHOULDER ARTHROSCOPY Left    x2  . TONSILLECTOMY    . TOTAL KNEE ARTHROPLASTY Left 09/21/2014   Procedure: TOTAL KNEE ARTHROPLASTY;  Surgeon: Frederik Pear, MD;  Location: [facility];  Service: Orthopedics;  Laterality: Left;  . ULNAR NERVE TRANSPOSITION Left 04/16/2017   Procedure: LEFT ULNAR NEUROPLASTY AT THE WRIST AND TRANSPOSITION AT THE ELBOW;  Surgeon: Milly Jakob, MD;  Location: [facility];  Service: Orthopedics;  Laterality: Left;  . WISDOM TOOTH EXTRACTION      MEDICATIONS: . diclofenac (VOLTAREN) 75 MG EC tablet  . diclofenac sodium (VOLTAREN) 1 % GEL  . docusate sodium (COLACE) 100 MG capsule  . fluticasone (FLONASE) 50 MCG/ACT nasal spray  . HYDROcodone-acetaminophen (NORCO/VICODIN) 5-325 MG tablet  . lisinopril-hydrochlorothiazide (PRINZIDE,ZESTORETIC) 20-25 MG tablet  . Nutritional Supplements (JUICE PLUS FIBRE PO)  . oxyCODONE (ROXICODONE) 5 MG immediate release tablet  . pantoprazole (PROTONIX) 40 MG tablet  . rosuvastatin (CRESTOR) 5 MG tablet  . Tamsulosin HCl (FLOMAX) 0.4 MG CAPS  . Tetrahydrozoline HCl (VISINE OP)   . traMADol (ULTRAM) 50 MG tablet   No current facility-administered medications for this encounter.     George Hugh [facility] Short Stay Center/Anesthesiology Phone [phone] 08/28/2017 6:07 PM

## 2017-08-30 ENCOUNTER — Other Ambulatory Visit (HOSPITAL_COMMUNITY): Payer: Medicare Other

## 2017-08-30 DIAGNOSIS — N289 Disorder of kidney and ureter, unspecified: Secondary | ICD-10-CM | POA: Diagnosis not present

## 2017-08-30 DIAGNOSIS — R7303 Prediabetes: Secondary | ICD-10-CM | POA: Diagnosis not present

## 2017-08-31 ENCOUNTER — Ambulatory Visit: Payer: Medicare Other | Admitting: Vascular Surgery

## 2017-09-03 ENCOUNTER — Other Ambulatory Visit: Payer: Self-pay

## 2017-09-03 ENCOUNTER — Encounter: Payer: Self-pay | Admitting: Vascular Surgery

## 2017-09-03 ENCOUNTER — Ambulatory Visit: Payer: Medicare Other | Admitting: Vascular Surgery

## 2017-09-03 ENCOUNTER — Ambulatory Visit (INDEPENDENT_AMBULATORY_CARE_PROVIDER_SITE_OTHER): Payer: Medicare Other | Admitting: Vascular Surgery

## 2017-09-03 VITALS — BP 113/69 | HR 93 | Temp 98.4°F | Resp 18 | Ht 67.5 in | Wt 234.0 lb

## 2017-09-03 DIAGNOSIS — I714 Abdominal aortic aneurysm, without rupture, unspecified: Secondary | ICD-10-CM

## 2017-09-03 NOTE — Progress Notes (Signed)
Patient ID: Walter White., male   DOB: 1950/10/01, 67 y.o.   MRN: [id number]  Reason for Consult: AAA (f/u)   Referred by Deland Pretty, MD  Subjective:     HPI:  Walter Samples. is a 67 y.o. male follows up for evaluation of his aneurysm.  He was recently evaluated by Dr. Donnetta Hutching and planning for his back surgery with Dr. Lynann Bologna later this week.  Ultrasound of his aorta was performed on August 14.  He has no new back pain although this is a stable issue for him he also has no new abdominal pain.  Past Medical History:  Diagnosis Date  . AAA (abdominal aortic aneurysm) (HCC)    4.0 cm AAA 08/22/17 U/S  . Allergy    Allergic Rhinitis  . Anticipated difficulty with intubation   . Arthritis    spinal steniosis  . At risk for difficult intubation    no trouble intubating as far as pt knows- swelling afterward -above note previous  . Benign prostate hyperplasia    with lower urinary tract symptoms  . Carpal tunnel syndrome on right   . Cataracts, both eyes   . Chronic back pain    caused by spinal stenosis  . 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 Elevated PSA   . GERD (gastroesophageal reflux disease)   . Hyperlipidemia   . Hypertension    Essential.    . Microhematuria   . OA (osteoarthritis) of knee   . Pneumonia    Hx -when very young  . Pre-diabetes   . Seasonal allergies   . Spinal stenosis    of lumbosacral region.  Causes chronic back pain.  [name] Upper abdominal pain    Family History  Problem Relation Age of Onset  . Cancer Mother   . Liver disease Father   . Colon cancer Neg Hx   . Colon polyps Neg Hx   . Esophageal cancer Neg Hx   . Rectal cancer Neg Hx   . Stomach cancer Neg Hx    Past Surgical History:  Procedure Laterality Date  . ANTERIOR LAT LUMBAR FUSION Left 01/26/2016   Procedure: LEFT SIDED LATERAL INTERBODY FUSION, LUMBAR 3-4 WITH  INSTRUMENTATION AND ALLOGRAFT;  Surgeon: Phylliss Bob, MD;  Location: [facility];  Service: Orthopedics;  Laterality: Left;  LEFT SIDED LATERAL INTERBODY FUSION, LUMBAR 3-4 WITH INSTRUMENTATION AND ALLOGRAFT  . BACK SURGERY  01/2015   dr. Lynann Bologna. [phone].  [name] CARPAL TUNNEL RELEASE     right   . COLONOSCOPY    . COLONOSCOPY  2010   polyp  . ELBOW SURGERY     right ulnar release  . ELBOW SURGERY     LEFT CTR  . JOINT REPLACEMENT     left- Sept 2016  . KNEE ARTHROSCOPY  11/23/2010   Procedure: ARTHROSCOPY KNEE;  Surgeon: Kerin Salen;  Location: [facility];  Service: Orthopedics;  Laterality: Left;  arthroscopy left knee  . KNEE ARTHROSCOPY     left x 2  . Knee scar tissue removal Left 2018  . LATERAL FUSION LUMBAR SPINE  01./17/2018  . polypectomy  2010  . SHOULDER ARTHROSCOPY Left    x2  . TONSILLECTOMY    . TOTAL KNEE ARTHROPLASTY Left 09/21/2014   Procedure: TOTAL KNEE ARTHROPLASTY;  Surgeon: Frederik Pear, MD;  Location: [facility];  Service:  Orthopedics;  Laterality: Left;  . ULNAR NERVE TRANSPOSITION Left 04/16/2017   Procedure: LEFT ULNAR NEUROPLASTY AT THE WRIST AND TRANSPOSITION AT THE ELBOW;  Surgeon: Milly Jakob, MD;  Location: [facility];  Service: Orthopedics;  Laterality: Left;  . WISDOM TOOTH EXTRACTION      Short Social History:  Social History   Tobacco Use  . Smoking status: Former Smoker    Last attempt to quit: 11/21/1980    Years since quitting: 36.8  . Smokeless tobacco: Never Used  Substance Use Topics  . Alcohol use: Yes    Alcohol/week: 0.0 standard drinks    Comment: Very rare    Allergies  Allergen Reactions  . Pork-Derived Products Shortness Of Breath    In large quantities   . Shellfish Allergy Shortness Of Breath    In large quantities   . Lactose Intolerance (Gi)     Dairy--allergic per allergy test  . Lovastatin     Bone pain   . Penicillins     Childhood allergy  Has patient had a PCN reaction causing  immediate rash, facial/tongue/throat swelling, SOB or lightheadedness with hypotension: Unknown Has patient had a PCN reaction causing severe rash involving mucus membranes or skin necrosis: Unknown Has patient had a PCN reaction that required hospitalization: Unknown Has patient had a PCN reaction occurring within the last 10 years: No If all of the above answers are "NO", then may proceed with Cephalosporin use.     Current Outpatient Medications  Medication Sig Dispense Refill  . diclofenac (VOLTAREN) 75 MG EC tablet Take 75 mg by mouth 2 (two) times daily.     . diclofenac sodium (VOLTAREN) 1 % GEL Apply 1 application topically daily as needed (pain).    [name] docusate sodium (COLACE) 100 MG capsule Take 100 mg by mouth daily.     . fluticasone (FLONASE) 50 MCG/ACT nasal spray Place 1 spray into both nostrils daily as needed for allergies.     [name] HYDROcodone-acetaminophen (NORCO/VICODIN) 5-325 MG tablet Take 1 tablet by mouth at bedtime.     [name] lisinopril-hydrochlorothiazide (PRINZIDE,ZESTORETIC) 20-25 MG tablet Take 1 tablet by mouth daily.    . Nutritional Supplements (JUICE PLUS FIBRE PO) Take 6 capsules by mouth daily. Juice Plus+ Orchard Blend 2 capsules in the morning Juice Plus+ Garden Blend 2 capsules in the morning Juice Plus+ Vineyard Blend 2 capsule in the morning    . pantoprazole (PROTONIX) 40 MG tablet Take 40 mg by mouth daily.    . rosuvastatin (CRESTOR) 5 MG tablet Take 5 mg by mouth daily.    . Tamsulosin HCl (FLOMAX) 0.4 MG CAPS Take 0.4 mg by mouth at bedtime.     . Tetrahydrozoline HCl (VISINE OP) Place 1 drop into both eyes daily as needed (irritation).    . traMADol (ULTRAM) 50 MG tablet Take 50 mg by mouth 3 (three) times daily.     [name] oxyCODONE (ROXICODONE) 5 MG immediate release tablet Take 1 tablet (5 mg total) by mouth every 6 (six) hours. (Patient not taking: Reported on 09/03/2017) 15 tablet 0   No current facility-administered medications for this visit.      Review of Systems  Constitutional:  Constitutional negative. HENT: HENT negative.  Eyes: Eyes negative.  Respiratory: Respiratory negative.  Cardiovascular: Cardiovascular negative.  GI: Gastrointestinal negative.  Musculoskeletal: Positive for back pain and leg pain.  Neurological: Positive for numbness.  Hematologic: Hematologic/lymphatic negative.  Psychiatric: Psychiatric negative.        Objective:  Objective   Vitals:   09/03/17 1118  BP: 113/69  Pulse: 93  Resp: 18  Temp: 98.4 F (36.9 C)  TempSrc: Oral  SpO2: 100%  Weight: 234 lb (106.1 kg)  Height: 5' 7.5" (1.715 m)   Body mass index is 36.11 kg/m.  Physical Exam  Constitutional: He appears well-developed.  HENT:  Head: Normocephalic.  Neck: Normal range of motion. Neck supple.  Cardiovascular: Normal rate and normal heart sounds.  Pulses:      Radial pulses are 2+ on the right side, and 2+ on the left side.       Popliteal pulses are 2+ on the right side, and 2+ on the left side.  Pulmonary/Chest: Effort normal.  Abdominal: He exhibits no mass.  Musculoskeletal: Normal range of motion. He exhibits no edema.  Neurological: He is alert.  Skin: Skin is warm and dry.    Data: I reviewed his recent ultrasound of his abdominal aorta which demonstrates largest aortic diameter 4.0 cm which is stable from previous exam.     Assessment/Plan:     67 year old male with 4 cm abdominal aneurysm sent for spinal surgery this week.  There are no contraindications to proceeding although it may put him at somewhat of a high risk he does understand this.  From an aneurysm standpoint we will follow him up in 1 year with repeat duplex.  If there are issues while he is inpatient Dr. Donnetta Hutching will be involved in his care and I will be around for evaluation as well.     Waynetta Sandy MD [facility]

## 2017-09-04 MED ORDER — VANCOMYCIN HCL 10 G IV SOLR
1500.0000 mg | INTRAVENOUS | Status: AC
  Administered 2017-09-05: 1500 mg via INTRAVENOUS
  Filled 2017-09-04: qty 1500

## 2017-09-04 MED ORDER — VANCOMYCIN HCL 10 G IV SOLR
1500.0000 mg | INTRAVENOUS | Status: DC
  Filled 2017-09-04: qty 1500

## 2017-09-04 NOTE — Anesthesia Preprocedure Evaluation (Addendum)
Anesthesia Evaluation  Patient identified by MRN, date of birth, ID band Patient awake    Reviewed: Allergy & Precautions, NPO status , Patient's Chart, lab work & pertinent test results  History of Anesthesia Complications (+) DIFFICULT AIRWAY  Airway Mallampati: II  TM Distance: >3 FB Neck ROM: Full    Dental no notable dental hx. (+) Teeth Intact, Dental Advisory Given   Pulmonary former smoker,    Pulmonary exam normal breath sounds clear to auscultation       Cardiovascular Exercise Tolerance: Good hypertension, Pt. on medications + Peripheral Vascular Disease  Normal cardiovascular exam Rhythm:Regular Rate:Normal     Neuro/Psych negative psych ROS   GI/Hepatic GERD  ,  Endo/Other  Morbid obesity  Renal/GU ARFRenal disease     Musculoskeletal  (+) Arthritis ,   Abdominal (+) + obese,   Peds  Hematology   Anesthesia Other Findings   Reproductive/Obstetrics                            Lab Results  Component Value Date   WBC 4.2 08/28/2017   HGB 13.1 08/28/2017   HCT 40.1 08/28/2017   MCV 94.4 08/28/2017   PLT 166 08/28/2017    Anesthesia Physical Anesthesia Plan  ASA: III  Anesthesia Plan: General   Post-op Pain Management:    Induction:   PONV Risk Score and Plan: Treatment may vary due to age or medical condition, Dexamethasone and Ondansetron  Airway Management Planned: Oral ETT and Video Laryngoscope Planned  Additional Equipment:   Intra-op Plan:   Post-operative Plan: Extubation in OR  Informed Consent: I have reviewed the patients History and Physical, chart, labs and discussed the procedure including the risks, benefits and alternatives for the proposed anesthesia with the patient or authorized representative who has indicated his/her understanding and acceptance.   Dental advisory given  Plan Discussed with:   Anesthesia Plan Comments:          Anesthesia Quick Evaluation

## 2017-09-05 ENCOUNTER — Inpatient Hospital Stay (HOSPITAL_COMMUNITY): Payer: Medicare Other

## 2017-09-05 ENCOUNTER — Inpatient Hospital Stay (HOSPITAL_COMMUNITY)
Admission: RE | Admit: 2017-09-05 | Discharge: 2017-09-07 | DRG: 455 | Disposition: A | Discharge status: Home / Self Care | Payer: Medicare Other | Attending: Orthopedic Surgery | Admitting: Orthopedic Surgery

## 2017-09-05 ENCOUNTER — Encounter (HOSPITAL_COMMUNITY): Payer: Self-pay | Admitting: *Deleted

## 2017-09-05 ENCOUNTER — Other Ambulatory Visit: Payer: Self-pay

## 2017-09-05 ENCOUNTER — Inpatient Hospital Stay (HOSPITAL_COMMUNITY): Payer: Medicare Other | Admitting: Anesthesiology

## 2017-09-05 ENCOUNTER — Inpatient Hospital Stay (HOSPITAL_COMMUNITY)
Admission: RE | Disposition: A | Discharge status: Home / Self Care | Payer: Self-pay | Source: Home / Self Care | Attending: Orthopedic Surgery

## 2017-09-05 ENCOUNTER — Inpatient Hospital Stay (HOSPITAL_COMMUNITY): Payer: Medicare Other | Admitting: Vascular Surgery

## 2017-09-05 DIAGNOSIS — Z888 Allergy status to other drugs, medicaments and biological substances status: Secondary | ICD-10-CM

## 2017-09-05 DIAGNOSIS — Z79891 Long term (current) use of opiate analgesic: Secondary | ICD-10-CM | POA: Diagnosis not present

## 2017-09-05 DIAGNOSIS — M4807 Spinal stenosis, lumbosacral region: Secondary | ICD-10-CM | POA: Diagnosis present

## 2017-09-05 DIAGNOSIS — I10 Essential (primary) hypertension: Secondary | ICD-10-CM | POA: Diagnosis present

## 2017-09-05 DIAGNOSIS — I714 Abdominal aortic aneurysm, without rupture: Secondary | ICD-10-CM | POA: Diagnosis present

## 2017-09-05 DIAGNOSIS — G8929 Other chronic pain: Secondary | ICD-10-CM | POA: Diagnosis present

## 2017-09-05 DIAGNOSIS — E785 Hyperlipidemia, unspecified: Secondary | ICD-10-CM | POA: Diagnosis present

## 2017-09-05 DIAGNOSIS — Z91013 Allergy to seafood: Secondary | ICD-10-CM

## 2017-09-05 DIAGNOSIS — N400 Enlarged prostate without lower urinary tract symptoms: Secondary | ICD-10-CM | POA: Diagnosis present

## 2017-09-05 DIAGNOSIS — Z6836 Body mass index (BMI) 36.0-36.9, adult: Secondary | ICD-10-CM

## 2017-09-05 DIAGNOSIS — N281 Cyst of kidney, acquired: Secondary | ICD-10-CM | POA: Diagnosis not present

## 2017-09-05 DIAGNOSIS — Z981 Arthrodesis status: Secondary | ICD-10-CM | POA: Diagnosis not present

## 2017-09-05 DIAGNOSIS — M5410 Radiculopathy, site unspecified: Secondary | ICD-10-CM | POA: Diagnosis present

## 2017-09-05 DIAGNOSIS — M4697 Unspecified inflammatory spondylopathy, lumbosacral region: Secondary | ICD-10-CM | POA: Diagnosis present

## 2017-09-05 DIAGNOSIS — M4656 Other infective spondylopathies, lumbar region: Secondary | ICD-10-CM | POA: Diagnosis not present

## 2017-09-05 DIAGNOSIS — Z91018 Allergy to other foods: Secondary | ICD-10-CM | POA: Diagnosis not present

## 2017-09-05 DIAGNOSIS — Z791 Long term (current) use of non-steroidal anti-inflammatories (NSAID): Secondary | ICD-10-CM | POA: Diagnosis not present

## 2017-09-05 DIAGNOSIS — J309 Allergic rhinitis, unspecified: Secondary | ICD-10-CM | POA: Diagnosis present

## 2017-09-05 DIAGNOSIS — Z87891 Personal history of nicotine dependence: Secondary | ICD-10-CM

## 2017-09-05 DIAGNOSIS — Z79899 Other long term (current) drug therapy: Secondary | ICD-10-CM

## 2017-09-05 DIAGNOSIS — M4326 Fusion of spine, lumbar region: Secondary | ICD-10-CM | POA: Diagnosis not present

## 2017-09-05 DIAGNOSIS — Z880 Allergy status to penicillin: Secondary | ICD-10-CM | POA: Diagnosis not present

## 2017-09-05 DIAGNOSIS — M48061 Spinal stenosis, lumbar region without neurogenic claudication: Secondary | ICD-10-CM | POA: Diagnosis not present

## 2017-09-05 DIAGNOSIS — M5416 Radiculopathy, lumbar region: Secondary | ICD-10-CM | POA: Diagnosis not present

## 2017-09-05 DIAGNOSIS — M960 Pseudarthrosis after fusion or arthrodesis: Secondary | ICD-10-CM | POA: Diagnosis not present

## 2017-09-05 DIAGNOSIS — M5417 Radiculopathy, lumbosacral region: Secondary | ICD-10-CM | POA: Diagnosis not present

## 2017-09-05 DIAGNOSIS — Z0389 Encounter for observation for other suspected diseases and conditions ruled out: Secondary | ICD-10-CM | POA: Diagnosis not present

## 2017-09-05 DIAGNOSIS — K219 Gastro-esophageal reflux disease without esophagitis: Secondary | ICD-10-CM | POA: Diagnosis present

## 2017-09-05 DIAGNOSIS — M5137 Other intervertebral disc degeneration, lumbosacral region: Secondary | ICD-10-CM | POA: Diagnosis present

## 2017-09-05 DIAGNOSIS — M47817 Spondylosis without myelopathy or radiculopathy, lumbosacral region: Secondary | ICD-10-CM | POA: Diagnosis not present

## 2017-09-05 DIAGNOSIS — M179 Osteoarthritis of knee, unspecified: Secondary | ICD-10-CM | POA: Diagnosis not present

## 2017-09-05 DIAGNOSIS — Z419 Encounter for procedure for purposes other than remedying health state, unspecified: Secondary | ICD-10-CM

## 2017-09-05 DIAGNOSIS — M545 Low back pain: Secondary | ICD-10-CM | POA: Diagnosis not present

## 2017-09-05 DIAGNOSIS — M4327 Fusion of spine, lumbosacral region: Secondary | ICD-10-CM | POA: Diagnosis not present

## 2017-09-05 HISTORY — PX: ABDOMINAL EXPOSURE: SHX5708

## 2017-09-05 HISTORY — PX: ANTERIOR LATERAL LUMBAR FUSION WITH PERCUTANEOUS SCREW 1 LEVEL: SHX5553

## 2017-09-05 LAB — GLUCOSE, CAPILLARY: Glucose-Capillary: 127 mg/dL — ABNORMAL HIGH (ref 70–99)

## 2017-09-05 SURGERY — ANTERIOR LATERAL LUMBAR FUSION WITH PERCUTANEOUS SCREW 1 LEVEL
Anesthesia: General | Site: Spine Lumbar

## 2017-09-05 MED ORDER — TETRAHYDROZOLINE HCL 0.05 % OP SOLN
2.0000 [drp] | Freq: Every day | OPHTHALMIC | Status: DC | PRN
  Filled 2017-09-05: qty 15

## 2017-09-05 MED ORDER — ACETAMINOPHEN 325 MG PO TABS: 650.0000 mg | ORAL_TABLET | ORAL | Status: DC | PRN

## 2017-09-05 MED ORDER — ZOLPIDEM TARTRATE 5 MG PO TABS: 5.0000 mg | ORAL_TABLET | Freq: Every evening | ORAL | Status: DC | PRN

## 2017-09-05 MED ORDER — ROCURONIUM BROMIDE 50 MG/5ML IV SOSY
PREFILLED_SYRINGE | INTRAVENOUS | Status: AC
  Filled 2017-09-05: qty 10

## 2017-09-05 MED ORDER — EPHEDRINE SULFATE 50 MG/ML IJ SOLN
INTRAMUSCULAR | Status: DC | PRN
  Administered 2017-09-05: 5 mg via INTRAVENOUS

## 2017-09-05 MED ORDER — LISINOPRIL 20 MG PO TABS
20.0000 mg | ORAL_TABLET | Freq: Every day | ORAL | Status: DC
  Administered 2017-09-05 – 2017-09-06 (×2): 20 mg via ORAL
  Filled 2017-09-05 (×2): qty 1

## 2017-09-05 MED ORDER — SUCCINYLCHOLINE CHLORIDE 200 MG/10ML IV SOSY
PREFILLED_SYRINGE | INTRAVENOUS | Status: AC
  Filled 2017-09-05: qty 10

## 2017-09-05 MED ORDER — SENNOSIDES-DOCUSATE SODIUM 8.6-50 MG PO TABS: 1.0000 | ORAL_TABLET | Freq: Every evening | ORAL | Status: DC | PRN

## 2017-09-05 MED ORDER — MENTHOL 3 MG MT LOZG: 1.0000 | LOZENGE | OROMUCOSAL | Status: DC | PRN

## 2017-09-05 MED ORDER — SODIUM CHLORIDE 0.9 % IV SOLN: 250.0000 mL | INTRAVENOUS | Status: DC

## 2017-09-05 MED ORDER — EPHEDRINE 5 MG/ML INJ
INTRAVENOUS | Status: AC
  Filled 2017-09-05: qty 10

## 2017-09-05 MED ORDER — ACETAMINOPHEN 650 MG RE SUPP: 650.0000 mg | RECTAL | Status: DC | PRN

## 2017-09-05 MED ORDER — LACTATED RINGERS IV SOLN
INTRAVENOUS | Status: DC
  Administered 2017-09-05: 06:00:00 via INTRAVENOUS

## 2017-09-05 MED ORDER — POVIDONE-IODINE 7.5 % EX SOLN
Freq: Once | CUTANEOUS | Status: DC
  Filled 2017-09-05: qty 118

## 2017-09-05 MED ORDER — THROMBIN (RECOMBINANT) 20000 UNITS EX SOLR
CUTANEOUS | Status: AC
  Filled 2017-09-05: qty 20000

## 2017-09-05 MED ORDER — PROPOFOL 10 MG/ML IV BOLUS
INTRAVENOUS | Status: DC | PRN
  Administered 2017-09-05: 180 mg via INTRAVENOUS

## 2017-09-05 MED ORDER — DOCUSATE SODIUM 100 MG PO CAPS
100.0000 mg | ORAL_CAPSULE | Freq: Every day | ORAL | Status: DC
  Administered 2017-09-05 – 2017-09-07 (×3): 100 mg via ORAL
  Filled 2017-09-05 (×3): qty 1

## 2017-09-05 MED ORDER — MIDAZOLAM HCL 5 MG/5ML IJ SOLN
INTRAMUSCULAR | Status: DC | PRN
  Administered 2017-09-05: 2 mg via INTRAVENOUS

## 2017-09-05 MED ORDER — VANCOMYCIN HCL IN DEXTROSE 1-5 GM/200ML-% IV SOLN
INTRAVENOUS | Status: AC
  Filled 2017-09-05: qty 200

## 2017-09-05 MED ORDER — DEXAMETHASONE SODIUM PHOSPHATE 10 MG/ML IJ SOLN
INTRAMUSCULAR | Status: AC
  Filled 2017-09-05: qty 2

## 2017-09-05 MED ORDER — PROPOFOL 500 MG/50ML IV EMUL
INTRAVENOUS | Status: DC | PRN
  Administered 2017-09-05: 25 ug/kg/min via INTRAVENOUS

## 2017-09-05 MED ORDER — CHLORHEXIDINE GLUCONATE 4 % EX LIQD: 60.0000 mL | Freq: Once | CUTANEOUS | Status: DC

## 2017-09-05 MED ORDER — OXYCODONE-ACETAMINOPHEN 5-325 MG PO TABS
1.0000 | ORAL_TABLET | ORAL | Status: DC | PRN
  Administered 2017-09-06 (×2): 2 via ORAL
  Administered 2017-09-07: 1 via ORAL
  Administered 2017-09-07: 2 via ORAL
  Filled 2017-09-05 (×4): qty 2

## 2017-09-05 MED ORDER — PROMETHAZINE HCL 25 MG/ML IJ SOLN: 6.2500 mg | INTRAMUSCULAR | Status: DC | PRN

## 2017-09-05 MED ORDER — POTASSIUM CHLORIDE IN NACL 20-0.9 MEQ/L-% IV SOLN
INTRAVENOUS | Status: DC
  Administered 2017-09-05: 10 mL/h via INTRAVENOUS
  Filled 2017-09-05: qty 1000

## 2017-09-05 MED ORDER — LACTATED RINGERS IV SOLN
INTRAVENOUS | Status: DC | PRN
  Administered 2017-09-05: 08:00:00 via INTRAVENOUS

## 2017-09-05 MED ORDER — ROCURONIUM BROMIDE 100 MG/10ML IV SOLN
INTRAVENOUS | Status: DC | PRN
  Administered 2017-09-05: 20 mg via INTRAVENOUS
  Administered 2017-09-05: 50 mg via INTRAVENOUS
  Administered 2017-09-05: 20 mg via INTRAVENOUS
  Administered 2017-09-05: 10 mg via INTRAVENOUS

## 2017-09-05 MED ORDER — MORPHINE SULFATE (PF) 2 MG/ML IV SOLN: 1.0000 mg | INTRAVENOUS | Status: DC | PRN

## 2017-09-05 MED ORDER — PROPOFOL 10 MG/ML IV BOLUS
INTRAVENOUS | Status: AC
  Filled 2017-09-05: qty 20

## 2017-09-05 MED ORDER — ONDANSETRON HCL 4 MG/2ML IJ SOLN: 4.0000 mg | Freq: Four times a day (QID) | INTRAMUSCULAR | Status: DC | PRN

## 2017-09-05 MED ORDER — SUGAMMADEX SODIUM 200 MG/2ML IV SOLN
INTRAVENOUS | Status: DC | PRN
  Administered 2017-09-05: 200 mg via INTRAVENOUS

## 2017-09-05 MED ORDER — ACETAMINOPHEN 10 MG/ML IV SOLN: 1000.0000 mg | Freq: Once | INTRAVENOUS | Status: DC | PRN

## 2017-09-05 MED ORDER — ALUM & MAG HYDROXIDE-SIMETH 200-200-20 MG/5ML PO SUSP: 30.0000 mL | Freq: Four times a day (QID) | ORAL | Status: DC | PRN

## 2017-09-05 MED ORDER — PHENYLEPHRINE 40 MCG/ML (10ML) SYRINGE FOR IV PUSH (FOR BLOOD PRESSURE SUPPORT)
PREFILLED_SYRINGE | INTRAVENOUS | Status: AC
  Filled 2017-09-05: qty 10

## 2017-09-05 MED ORDER — HYDROMORPHONE HCL 1 MG/ML IJ SOLN
INTRAMUSCULAR | Status: AC
  Filled 2017-09-05: qty 1

## 2017-09-05 MED ORDER — BUPIVACAINE-EPINEPHRINE 0.25% -1:200000 IJ SOLN
INTRAMUSCULAR | Status: DC | PRN
  Administered 2017-09-05: 10 mL

## 2017-09-05 MED ORDER — VANCOMYCIN HCL IN DEXTROSE 1-5 GM/200ML-% IV SOLN
1000.0000 mg | Freq: Once | INTRAVENOUS | Status: AC
  Administered 2017-09-05: 1000 mg via INTRAVENOUS
  Filled 2017-09-05: qty 200

## 2017-09-05 MED ORDER — LIDOCAINE 2% (20 MG/ML) 5 ML SYRINGE
INTRAMUSCULAR | Status: AC
  Filled 2017-09-05: qty 5

## 2017-09-05 MED ORDER — ROSUVASTATIN CALCIUM 5 MG PO TABS
5.0000 mg | ORAL_TABLET | Freq: Every day | ORAL | Status: DC
  Administered 2017-09-05 – 2017-09-07 (×3): 5 mg via ORAL
  Filled 2017-09-05 (×3): qty 1

## 2017-09-05 MED ORDER — VECURONIUM BROMIDE 10 MG IV SOLR
INTRAVENOUS | Status: AC
  Filled 2017-09-05: qty 10

## 2017-09-05 MED ORDER — SODIUM CHLORIDE 0.9% FLUSH
3.0000 mL | Freq: Two times a day (BID) | INTRAVENOUS | Status: DC
  Administered 2017-09-05: 3 mL via INTRAVENOUS

## 2017-09-05 MED ORDER — TAMSULOSIN HCL 0.4 MG PO CAPS
0.4000 mg | ORAL_CAPSULE | Freq: Every day | ORAL | Status: DC
  Administered 2017-09-05 – 2017-09-06 (×2): 0.4 mg via ORAL
  Filled 2017-09-05 (×2): qty 1

## 2017-09-05 MED ORDER — ONDANSETRON HCL 4 MG/2ML IJ SOLN
INTRAMUSCULAR | Status: AC
  Filled 2017-09-05: qty 2

## 2017-09-05 MED ORDER — PHENOL 1.4 % MT LIQD: 1.0000 | OROMUCOSAL | Status: DC | PRN

## 2017-09-05 MED ORDER — PANTOPRAZOLE SODIUM 40 MG PO TBEC
40.0000 mg | DELAYED_RELEASE_TABLET | Freq: Every day | ORAL | Status: DC
  Administered 2017-09-06 – 2017-09-07 (×2): 40 mg via ORAL
  Filled 2017-09-05 (×2): qty 1

## 2017-09-05 MED ORDER — BISACODYL 5 MG PO TBEC: 5.0000 mg | DELAYED_RELEASE_TABLET | Freq: Every day | ORAL | Status: DC | PRN

## 2017-09-05 MED ORDER — HYDROCHLOROTHIAZIDE 25 MG PO TABS
25.0000 mg | ORAL_TABLET | Freq: Every day | ORAL | Status: DC
  Administered 2017-09-05 – 2017-09-06 (×2): 25 mg via ORAL
  Filled 2017-09-05 (×2): qty 1

## 2017-09-05 MED ORDER — ROCURONIUM BROMIDE 50 MG/5ML IV SOSY
PREFILLED_SYRINGE | INTRAVENOUS | Status: AC
  Filled 2017-09-05: qty 5

## 2017-09-05 MED ORDER — DIAZEPAM 5 MG PO TABS: 5.0000 mg | ORAL_TABLET | Freq: Four times a day (QID) | ORAL | Status: DC | PRN

## 2017-09-05 MED ORDER — MEPERIDINE HCL 50 MG/ML IJ SOLN: 6.2500 mg | INTRAMUSCULAR | Status: DC | PRN

## 2017-09-05 MED ORDER — LIDOCAINE HCL (CARDIAC) PF 100 MG/5ML IV SOSY
PREFILLED_SYRINGE | INTRAVENOUS | Status: DC | PRN
  Administered 2017-09-05: 100 mg via INTRAVENOUS

## 2017-09-05 MED ORDER — FENTANYL CITRATE (PF) 250 MCG/5ML IJ SOLN
INTRAMUSCULAR | Status: AC
  Filled 2017-09-05: qty 5

## 2017-09-05 MED ORDER — ONDANSETRON HCL 4 MG/2ML IJ SOLN
INTRAMUSCULAR | Status: DC | PRN
  Administered 2017-09-05: 4 mg via INTRAVENOUS

## 2017-09-05 MED ORDER — FENTANYL CITRATE (PF) 100 MCG/2ML IJ SOLN
INTRAMUSCULAR | Status: DC | PRN
  Administered 2017-09-05: 50 ug via INTRAVENOUS
  Administered 2017-09-05: 100 ug via INTRAVENOUS
  Administered 2017-09-05: 50 ug via INTRAVENOUS

## 2017-09-05 MED ORDER — HYDROMORPHONE HCL 1 MG/ML IJ SOLN
0.2500 mg | INTRAMUSCULAR | Status: DC | PRN
  Administered 2017-09-05 (×2): 0.5 mg via INTRAVENOUS

## 2017-09-05 MED ORDER — BUPIVACAINE-EPINEPHRINE (PF) 0.25% -1:200000 IJ SOLN
INTRAMUSCULAR | Status: AC
  Filled 2017-09-05: qty 30

## 2017-09-05 MED ORDER — FLUTICASONE PROPIONATE 50 MCG/ACT NA SUSP
1.0000 | Freq: Every day | NASAL | Status: DC | PRN
  Filled 2017-09-05: qty 16

## 2017-09-05 MED ORDER — SODIUM CHLORIDE 0.9% FLUSH: 3.0000 mL | INTRAVENOUS | Status: DC | PRN

## 2017-09-05 MED ORDER — MIDAZOLAM HCL 2 MG/2ML IJ SOLN
INTRAMUSCULAR | Status: AC
  Filled 2017-09-05: qty 2

## 2017-09-05 MED ORDER — FLEET ENEMA 7-19 GM/118ML RE ENEM: 1.0000 | ENEMA | Freq: Once | RECTAL | Status: DC | PRN

## 2017-09-05 MED ORDER — THROMBIN 20000 UNITS EX SOLR
CUTANEOUS | Status: DC | PRN
  Administered 2017-09-05: 20000 [IU] via TOPICAL

## 2017-09-05 MED ORDER — ONDANSETRON HCL 4 MG PO TABS: 4.0000 mg | ORAL_TABLET | Freq: Four times a day (QID) | ORAL | Status: DC | PRN

## 2017-09-05 MED ORDER — HYDROCODONE-ACETAMINOPHEN 7.5-325 MG PO TABS: 1.0000 | ORAL_TABLET | Freq: Once | ORAL | Status: DC | PRN

## 2017-09-05 MED ORDER — SODIUM CHLORIDE 0.9 % IV SOLN
INTRAVENOUS | Status: DC | PRN
  Administered 2017-09-05: 50 ug/min via INTRAVENOUS

## 2017-09-05 MED ORDER — LISINOPRIL-HYDROCHLOROTHIAZIDE 20-25 MG PO TABS: 1.0000 | ORAL_TABLET | Freq: Every day | ORAL | Status: DC

## 2017-09-05 SURGICAL SUPPLY — 92 items
APPLIER CLIP 11 MED OPEN (CLIP) ×4
BENZOIN TINCTURE PRP APPL 2/3 (GAUZE/BANDAGES/DRESSINGS) ×4 IMPLANT
BLADE CLIPPER SURG (BLADE) IMPLANT
BLADE SURG 10 STRL SS (BLADE) ×4 IMPLANT
BONE VIVIGEN FORMABLE 1.3CC (Bone Implant) ×8 IMPLANT
BONE VIVIGEN FORMABLE 10CC (Bone Implant) ×4 IMPLANT
CLIP APPLIE 11 MED OPEN (CLIP) ×2 IMPLANT
CLIP LIGATING EXTRA MED SLVR (CLIP) ×4 IMPLANT
CLIP LIGATING EXTRA SM BLUE (MISCELLANEOUS) ×4 IMPLANT
CLOSURE STERI-STRIP 1/2X4 (GAUZE/BANDAGES/DRESSINGS) ×1
CLOSURE WOUND 1/2 X4 (GAUZE/BANDAGES/DRESSINGS) ×1
CLSR STERI-STRIP ANTIMIC 1/2X4 (GAUZE/BANDAGES/DRESSINGS) ×3 IMPLANT
CORDS BIPOLAR (ELECTRODE) ×4 IMPLANT
COVER SURGICAL LIGHT HANDLE (MISCELLANEOUS) ×4 IMPLANT
DERMABOND ADVANCED (GAUZE/BANDAGES/DRESSINGS) ×2
DERMABOND ADVANCED .7 DNX12 (GAUZE/BANDAGES/DRESSINGS) ×2 IMPLANT
DRAPE C-ARM 42X72 X-RAY (DRAPES) ×4 IMPLANT
DRAPE C-ARMOR (DRAPES) ×4 IMPLANT
DRAPE ORTHO SPLIT 77X108 STRL (DRAPES)
DRAPE POUCH INSTRU U-SHP 10X18 (DRAPES) ×4 IMPLANT
DRAPE SURG 17X23 STRL (DRAPES) ×16 IMPLANT
DRAPE SURG ORHT 6 SPLT 77X108 (DRAPES) IMPLANT
DURAPREP 26ML APPLICATOR (WOUND CARE) ×4 IMPLANT
ELECT BLADE 4.0 EZ CLEAN MEGAD (MISCELLANEOUS) ×4
ELECT BLADE 6.5 EXT (BLADE) ×4 IMPLANT
ELECT CAUTERY BLADE 6.4 (BLADE) ×4 IMPLANT
ELECT REM PT RETURN 9FT ADLT (ELECTROSURGICAL) ×4
ELECTRODE BLDE 4.0 EZ CLN MEGD (MISCELLANEOUS) ×2 IMPLANT
ELECTRODE REM PT RTRN 9FT ADLT (ELECTROSURGICAL) ×2 IMPLANT
FEE INTRAOP MONITOR IMPULS NCS (MISCELLANEOUS) ×2 IMPLANT
GAUZE 4X4 16PLY RFD (DISPOSABLE) ×4 IMPLANT
GLOVE BIO SURGEON STRL SZ7 (GLOVE) ×8 IMPLANT
GLOVE BIO SURGEON STRL SZ8 (GLOVE) ×8 IMPLANT
GLOVE BIOGEL PI IND STRL 7.0 (GLOVE) ×4 IMPLANT
GLOVE BIOGEL PI IND STRL 8 (GLOVE) ×4 IMPLANT
GLOVE BIOGEL PI INDICATOR 7.0 (GLOVE) ×4
GLOVE BIOGEL PI INDICATOR 8 (GLOVE) ×4
GLOVE SS BIOGEL STRL SZ 7.5 (GLOVE) ×2 IMPLANT
GLOVE SUPERSENSE BIOGEL SZ 7.5 (GLOVE) ×2
GOWN STRL REUS W/ TWL LRG LVL3 (GOWN DISPOSABLE) ×4 IMPLANT
GOWN STRL REUS W/ TWL XL LVL3 (GOWN DISPOSABLE) ×2 IMPLANT
GOWN STRL REUS W/TWL LRG LVL3 (GOWN DISPOSABLE) ×4
GOWN STRL REUS W/TWL XL LVL3 (GOWN DISPOSABLE) ×2
INSERT FOGARTY 61MM (MISCELLANEOUS) IMPLANT
INSERT FOGARTY SM (MISCELLANEOUS) IMPLANT
INTRAOP MONITOR FEE IMPULS NCS (MISCELLANEOUS) ×2
INTRAOP MONITOR FEE IMPULSE (MISCELLANEOUS) ×2
KIT BASIN OR (CUSTOM PROCEDURE TRAY) ×4 IMPLANT
KIT TURNOVER KIT B (KITS) ×4 IMPLANT
LOOP VESSEL MAXI BLUE (MISCELLANEOUS) IMPLANT
LOOP VESSEL MINI RED (MISCELLANEOUS) IMPLANT
NEEDLE HYPO 22GX1.5 SAFETY (NEEDLE) ×4 IMPLANT
NEEDLE HYPO 25GX1X1/2 BEV (NEEDLE) ×8 IMPLANT
NEEDLE SPNL 18GX3.5 QUINCKE PK (NEEDLE) IMPLANT
NS IRRIG 1000ML POUR BTL (IV SOLUTION) ×4 IMPLANT
PACK LAMINECTOMY ORTHO (CUSTOM PROCEDURE TRAY) ×4 IMPLANT
PACK UNIVERSAL I (CUSTOM PROCEDURE TRAY) ×4 IMPLANT
PAD ARMBOARD 7.5X6 YLW CONV (MISCELLANEOUS) ×12 IMPLANT
PAD SHARPS MAGNETIC DISPOSAL (MISCELLANEOUS) ×4 IMPLANT
SPACER SPINAL COUGAR LG 18X10D (Spacer) ×4 IMPLANT
SPONGE INTESTINAL PEANUT (DISPOSABLE) ×12 IMPLANT
SPONGE LAP 18X18 X RAY DECT (DISPOSABLE) IMPLANT
SPONGE LAP 4X18 RFD (DISPOSABLE) ×4 IMPLANT
SPONGE SURGIFOAM ABS GEL 100 (HEMOSTASIS) IMPLANT
STAPLER VISISTAT 35W (STAPLE) IMPLANT
STRIP CLOSURE SKIN 1/2X4 (GAUZE/BANDAGES/DRESSINGS) ×3 IMPLANT
SURGIFLO W/THROMBIN 8M KIT (HEMOSTASIS) IMPLANT
SUT MNCRL AB 4-0 PS2 18 (SUTURE) ×8 IMPLANT
SUT PROLENE 4 0 RB 1 (SUTURE)
SUT PROLENE 4-0 RB1 .5 CRCL 36 (SUTURE) IMPLANT
SUT PROLENE 5 0 C 1 24 (SUTURE) IMPLANT
SUT PROLENE 5 0 CC1 (SUTURE) IMPLANT
SUT PROLENE 6 0 C 1 30 (SUTURE) ×4 IMPLANT
SUT PROLENE 6 0 CC (SUTURE) IMPLANT
SUT SILK 0 TIES 10X30 (SUTURE) ×4 IMPLANT
SUT SILK 2 0 TIES 10X30 (SUTURE) ×4 IMPLANT
SUT SILK 2 0SH CR/8 30 (SUTURE) IMPLANT
SUT SILK 3 0 TIES 10X30 (SUTURE) ×4 IMPLANT
SUT SILK 3 0SH CR/8 30 (SUTURE) IMPLANT
SUT VIC AB 0 CT1 18XCR BRD 8 (SUTURE) ×2 IMPLANT
SUT VIC AB 0 CT1 8-18 (SUTURE) ×2
SUT VIC AB 1 CT1 18XCR BRD 8 (SUTURE) ×2 IMPLANT
SUT VIC AB 1 CT1 8-18 (SUTURE) ×2
SUT VIC AB 2-0 CT2 18 VCP726D (SUTURE) ×4 IMPLANT
SYR BULB IRRIGATION 50ML (SYRINGE) ×4 IMPLANT
TAPE CLOTH SURG 6X10 WHT LF (GAUZE/BANDAGES/DRESSINGS) ×4 IMPLANT
TOWEL GREEN STERILE (TOWEL DISPOSABLE) ×4 IMPLANT
TOWEL OR 17X24 6PK STRL BLUE (TOWEL DISPOSABLE) ×4 IMPLANT
TOWEL OR 17X26 10 PK STRL BLUE (TOWEL DISPOSABLE) ×4 IMPLANT
TRAY FOLEY CATH SILVER 16FR (SET/KITS/TRAYS/PACK) ×4 IMPLANT
WATER STERILE IRR 1000ML POUR (IV SOLUTION) ×4 IMPLANT
YANKAUER SUCT BULB TIP NO VENT (SUCTIONS) ×4 IMPLANT

## 2017-09-05 NOTE — Progress Notes (Signed)
Pharmacy Antibiotic Note  Walter White. is a 67 y.o. male admitted on 09/05/2017 for spinal procedure.  Pharmacy has been consulted for vancomycin dosing for surgical prophylaxis.  Patient received vancomycin 1500mg  IV around 0620 and does not have a drain.  SCr 1.58, CrCL 45 ml/min.   Plan: Vanc 1gm IV x 1 now Pharmacy will sign off.  Thank you for the consult!   Height: 5' 7.5" (171.5 cm) Weight: 234 lb (106.1 kg) IBW/kg (Calculated) : 67.25  Temp (24hrs), Avg:97.8 F (36.6 C), Min:97.3 F (36.3 C), Max:98.4 F (36.9 C)  No results for input(s): WBC, CREATININE, LATICACIDVEN, VANCOTROUGH, VANCOPEAK, VANCORANDOM, GENTTROUGH, GENTPEAK, GENTRANDOM, TOBRATROUGH, TOBRAPEAK, TOBRARND, AMIKACINPEAK, AMIKACINTROU, AMIKACIN in the last 168 hours.  Estimated Creatinine Clearance: 51.7 mL/min (A) (by C-G formula based on SCr of 1.58 mg/dL (H)).    Allergies  Allergen Reactions  . Pork-Derived Products Shortness Of Breath  . Shellfish Allergy Shortness Of Breath  . Lovastatin Other (See Comments)    Bone pain   . Lactose Intolerance (Gi) Other (See Comments)    Dairy--allergic per allergy test  . Penicillins     UNSPECIFIED CHILDHOOD REACTION  Has patient had a PCN reaction causing immediate rash, facial/tongue/throat swelling, SOB or lightheadedness with hypotension: Unknown Has patient had a PCN reaction causing severe rash involving mucus membranes or skin necrosis: Unknown Has patient had a PCN reaction that required hospitalization: Unknown Has patient had a PCN reaction occurring within the last 10 years: No If all of the above answers are "NO", then may proceed with Cephalosporin use.      Kawena Lyday D. Mina [name], PharmD, BCPS, [place] 09/05/2017, 7:01 PM

## 2017-09-05 NOTE — Anesthesia Preprocedure Evaluation (Signed)
Anesthesia Evaluation  Patient identified by MRN, date of birth, ID band Patient awake    Reviewed: Allergy & Precautions, NPO status , Patient's Chart, lab work & pertinent test results  History of Anesthesia Complications (+) DIFFICULT AIRWAY  Airway Mallampati: II  TM Distance: >3 FB Neck ROM: Full    Dental no notable dental hx. (+) Teeth Intact, Dental Advisory Given   Pulmonary former smoker,    Pulmonary exam normal breath sounds clear to auscultation       Cardiovascular Exercise Tolerance: Good hypertension, Pt. on medications + Peripheral Vascular Disease  Normal cardiovascular exam Rhythm:Regular Rate:Normal     Neuro/Psych negative psych ROS   GI/Hepatic GERD  ,  Endo/Other  Morbid obesity  Renal/GU ARFRenal disease     Musculoskeletal  (+) Arthritis ,   Abdominal (+) + obese,   Peds  Hematology   Anesthesia Other Findings   Reproductive/Obstetrics                             Lab Results  Component Value Date   WBC 4.2 08/28/2017   HGB 13.1 08/28/2017   HCT 40.1 08/28/2017   MCV 94.4 08/28/2017   PLT 166 08/28/2017   Lab Results  Component Value Date   CREATININE 1.58 (H) 08/28/2017   BUN 39 (H) 08/28/2017   NA 137 08/28/2017   K 3.9 08/28/2017   CL 106 08/28/2017   CO2 25 08/28/2017    Anesthesia Physical  Anesthesia Plan  ASA: III  Anesthesia Plan: General   Post-op Pain Management:    Induction:   PONV Risk Score and Plan: Treatment may vary due to age or medical condition, Dexamethasone and Ondansetron  Airway Management Planned: Oral ETT and Video Laryngoscope Planned  Additional Equipment:   Intra-op Plan:   Post-operative Plan: Extubation in OR  Informed Consent: I have reviewed the patients History and Physical, chart, labs and discussed the procedure including the risks, benefits and alternatives for the proposed anesthesia with the  patient or authorized representative who has indicated his/her understanding and acceptance.   Dental advisory given  Plan Discussed with:   Anesthesia Plan Comments:         Anesthesia Quick Evaluation

## 2017-09-05 NOTE — Anesthesia Postprocedure Evaluation (Signed)
Anesthesia Post Note  Patient: Walter White.  Procedure(s) Performed: LUMBAR 5 - SACRUM 1 ANTERIOR LUMBAR INTERBODY FUSION WITH INSTRUMENTATION AND ALLOGRAFT (N/A Spine Lumbar) ABDOMINAL EXPOSURE (N/A Abdomen)     Patient location during evaluation: PACU Anesthesia Type: General Level of consciousness: awake and alert Pain management: pain level controlled Vital Signs Assessment: post-procedure vital signs reviewed and stable Respiratory status: spontaneous breathing, nonlabored ventilation, respiratory function stable and patient connected to nasal cannula oxygen Cardiovascular status: blood pressure returned to baseline and stable Postop Assessment: no apparent nausea or vomiting Anesthetic complications: no    Last Vitals:  Vitals:   09/05/17 1730 09/05/17 1748  BP: 130/68 126/79  Pulse: (!) 58 87  Resp: 13 14  Temp:  36.7 C  SpO2: 100% 100%    Last Pain:  Vitals:   09/05/17 1748  TempSrc: Oral  PainSc:                  Barnet Glasgow

## 2017-09-05 NOTE — Transfer of Care (Signed)
Immediate Anesthesia Transfer of Care Note  Patient: Walter White.  Procedure(s) Performed: LUMBAR 5 - SACRUM 1 ANTERIOR LUMBAR INTERBODY FUSION WITH INSTRUMENTATION AND ALLOGRAFT (N/A Spine Lumbar) ABDOMINAL EXPOSURE (N/A Abdomen)  Patient Location: PACU  Anesthesia Type:General  Level of Consciousness: drowsy  Airway & Oxygen Therapy: Patient Spontanous Breathing and Patient connected to nasal cannula oxygen  Post-op Assessment: Report given to RN and Post -op Vital signs reviewed and stable  Post vital signs: Reviewed and stable  Last Vitals:  Vitals Value Taken Time  BP    Temp    Pulse 80 09/05/2017 11:39 AM  Resp 17 09/05/2017 11:39 AM  SpO2 97 % 09/05/2017 11:39 AM  Vitals shown include unvalidated device data.  Last Pain:  Vitals:   09/05/17 0616  TempSrc:   PainSc: 8       Patients Stated Pain Goal: 3 (35/57/32 2025)  Complications: none

## 2017-09-05 NOTE — H&P (Signed)
PREOPERATIVE H&P  Chief Complaint: Low back pain  HPI: Walter White. is a 67 y.o. male who presents with ongoing pain in the low back  MRI reveals DDD and facet arthropathy  Patient has failed multiple forms of conservative care and continues to have pain (see office notes for additional details regarding the patient's full course of treatment)  Past Medical History:  Diagnosis Date  . AAA (abdominal aortic aneurysm) (HCC)    4.0 cm AAA 08/22/17 U/S  . Allergy    Allergic Rhinitis  . Anticipated difficulty with intubation   . Arthritis    spinal steniosis  . At risk for difficult intubation    no trouble intubating as far as pt knows- swelling afterward -above note previous  . Benign prostate hyperplasia    with lower urinary tract symptoms  . Carpal tunnel syndrome on right   . Cataracts, both eyes   . Chronic back pain    caused by spinal stenosis  . 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 Elevated PSA   . GERD (gastroesophageal reflux disease)   . Hyperlipidemia   . Hypertension    Essential.    . Microhematuria   . OA (osteoarthritis) of knee   . Pneumonia    Hx -when very young  . Pre-diabetes   . Seasonal allergies   . Spinal stenosis    of lumbosacral region.  Causes chronic back pain.  [name] Upper abdominal pain    Past Surgical History:  Procedure Laterality Date  . ANTERIOR LAT LUMBAR FUSION Left 01/26/2016   Procedure: LEFT SIDED LATERAL INTERBODY FUSION, LUMBAR 3-4 WITH INSTRUMENTATION AND ALLOGRAFT;  Surgeon: Phylliss Bob, MD;  Location: [facility];  Service: Orthopedics;  Laterality: Left;  LEFT SIDED LATERAL INTERBODY FUSION, LUMBAR 3-4 WITH INSTRUMENTATION AND ALLOGRAFT  . BACK SURGERY  01/2015   dr. Lynann Bologna. 0630,[id number].  [name] CARPAL TUNNEL RELEASE     right   . COLONOSCOPY    . COLONOSCOPY  2010   polyp  . ELBOW SURGERY     right ulnar release  .  ELBOW SURGERY     LEFT CTR  . JOINT REPLACEMENT     left- Sept 2016  . KNEE ARTHROSCOPY  11/23/2010   Procedure: ARTHROSCOPY KNEE;  Surgeon: Kerin Salen;  Location: [facility];  Service: Orthopedics;  Laterality: Left;  arthroscopy left knee  . KNEE ARTHROSCOPY     left x 2  . Knee scar tissue removal Left 2018  . LATERAL FUSION LUMBAR SPINE  01./17/2018  . polypectomy  2010  . SHOULDER ARTHROSCOPY Left    x2  . TONSILLECTOMY    . TOTAL KNEE ARTHROPLASTY Left 09/21/2014   Procedure: TOTAL KNEE ARTHROPLASTY;  Surgeon: Frederik Pear, MD;  Location: [facility];  Service: Orthopedics;  Laterality: Left;  . ULNAR NERVE TRANSPOSITION Left 04/16/2017   Procedure: LEFT ULNAR NEUROPLASTY AT THE WRIST AND TRANSPOSITION AT THE ELBOW;  Surgeon: Milly Jakob, MD;  Location: [facility];  Service: Orthopedics;  Laterality: Left;  . WISDOM TOOTH EXTRACTION     Social History   Socioeconomic History  . Marital status: Married    Spouse name: [name]   . Number of children: 2  . Years of education: Some colle  . Highest education level: Not on file  Occupational History  . Occupation: Retired  Social Needs  . Financial resource strain: Not on file  . Food insecurity:    Worry: Not on file    Inability: Not on file  . Transportation needs:    Medical: Not on file    Non-medical: Not on file  Tobacco Use  . Smoking status: Former Smoker    Last attempt to quit: 11/21/1980    Years since quitting: 36.8  . Smokeless tobacco: Never Used  Substance and Sexual Activity  . Alcohol use: Yes    Alcohol/week: 0.0 standard drinks    Comment: Very rare  . Drug use: No  . Sexual activity: Not on file  Lifestyle  . Physical activity:    Days per week: Not on file    Minutes per session: Not on file  . Stress: Not on file  Relationships  . Social connections:    Talks on phone: Not on file    Gets together: Not on file    Attends religious service: Not on file     Active member of club or organization: Not on file    Attends meetings of clubs or organizations: Not on file    Relationship status: Not on file  Other Topics Concern  . Not on file  Social History Narrative   Lives with wife   Caffeine use: tea every day   Family History  Problem Relation Age of Onset  . Cancer Mother   . Liver disease Father   . Colon cancer Neg Hx   . Colon polyps Neg Hx   . Esophageal cancer Neg Hx   . Rectal cancer Neg Hx   . Stomach cancer Neg Hx    Allergies  Allergen Reactions  . Pork-Derived Products Shortness Of Breath  . Shellfish Allergy Shortness Of Breath  . Lovastatin Other (See Comments)    Bone pain   . Lactose Intolerance (Gi) Other (See Comments)    Dairy--allergic per allergy test  . Penicillins     UNSPECIFIED CHILDHOOD REACTION  Has patient had a PCN reaction causing immediate rash, facial/tongue/throat swelling, SOB or lightheadedness with hypotension: Unknown Has patient had a PCN reaction causing severe rash involving mucus membranes or skin necrosis: Unknown Has patient had a PCN reaction that required hospitalization: Unknown Has patient had a PCN reaction occurring within the last 10 years: No If all of the above answers are "NO", then may proceed with Cephalosporin use.    Prior to Admission medications   Medication Sig Start Date End Date Taking? Authorizing Provider  diclofenac sodium (VOLTAREN) 1 % GEL Apply 1 application topically daily as needed (pain).   Yes [provider]  docusate sodium (COLACE) 100 MG capsule Take 100 mg by mouth daily.    Yes [provider]  fluticasone (FLONASE) 50 MCG/ACT nasal spray Place 1 spray into both nostrils daily as needed for allergies.  10/12/15  Yes [provider]  HYDROcodone-acetaminophen (NORCO/VICODIN) 5-325 MG tablet Take 1 tablet by mouth at bedtime.  01/31/17  Yes [provider]  lisinopril-hydrochlorothiazide (PRINZIDE,ZESTORETIC) 20-25 MG  tablet Take 1 tablet by mouth daily. 07/08/17  Yes [provider]  Nutritional Supplements (JUICE PLUS FIBRE PO) Take 6 capsules by mouth daily. Juice Plus+ Orchard Blend 2 capsules in the morning Juice Plus+ Garden Blend 2 capsules in the morning Juice Plus+ Vineyard Blend 2 capsule in the morning   Yes [provider]  pantoprazole (PROTONIX) 40 MG tablet Take 40 mg by mouth daily.   Yes [provider]  rosuvastatin (CRESTOR) 5 MG tablet Take 5 mg by mouth daily.   Yes [provider]  Tamsulosin HCl (FLOMAX) 0.4 MG CAPS Take 0.4 mg by mouth at bedtime.    Yes [provider]  Tetrahydrozoline HCl (VISINE OP) Place 1 drop into both eyes daily as needed (irritation).   Yes [provider]  traMADol (ULTRAM) 50 MG tablet Take 50 mg by mouth 3 (three) times daily.    Yes [provider]  diclofenac (VOLTAREN) 75 MG EC tablet Take 75 mg by mouth 2 (two) times daily.  03/02/17   [provider]  oxyCODONE (ROXICODONE) 5 MG immediate release tablet Take 1 tablet (5 mg total) by mouth every 6 (six) hours. Patient not taking: Reported on 09/03/2017 04/16/17   Milly Jakob, MD     All other systems have been reviewed and were otherwise negative with the exception of those mentioned in the HPI and as above.  Physical Exam: Vitals:   09/05/17 0602  BP: 134/81  Pulse: 80  Resp: 18  Temp: 98.4 F (36.9 C)  SpO2: 95%    Body mass index is 36.11 kg/m.  General: Alert, no acute distress Cardiovascular: No pedal edema Respiratory: No cyanosis, no use of accessory musculature Skin: No lesions in the area of chief complaint Neurologic: Sensation intact distally Psychiatric: Patient is competent for consent with normal mood and affect Lymphatic: No axillary or cervical lymphadenopathy   Assessment/Plan: LOW BACK PAIN Plan for Procedure(s): LUMBAR 5 - SACRUM 1 ANTERIOR LUMBAR INTERBODY FUSION WITH INSTRUMENTATION AND  ALLOGRAFT   Sinclair Ship, MD 09/05/2017 6:52 AM

## 2017-09-05 NOTE — Op Note (Signed)
    OPERATIVE REPORT  DATE OF SURGERY: 09/05/2017  PATIENT: Walter White., 67 y.o. male MRN: [id number]  DOB: 04-29-1950  PRE-OPERATIVE DIAGNOSIS: Degenerative disc disease  POST-OPERATIVE DIAGNOSIS:  Same  PROCEDURE: Anterior exposure for L5-S1 disc surgery  SURGEON:  Curt Jews, M.D.  PHYSICIAN ASSISTANT: Fortunato Curling, MD  Co-surgeon for the exposure Dr. Phylliss Bob  ANESTHESIA: General  EBL: per anesthesia record  Total I/O In: 1700 [I.V.:1700] Out: 425 [Urine:325; Blood:100]  BLOOD ADMINISTERED: none  DRAINS: none  SPECIMEN: none  COUNTS CORRECT:  YES  PATIENT DISPOSITION:  PACU - hemodynamically stable  PROCEDURE DETAILS: The patient was taken to the operating room placed in supine position where the area of the abdomen was prepped and draped in the usual sterile fashion.  Crosstable lateral imaging revealed the level of the L5-S1 disc.  Incision was made over this level just above the pubic bone from the midline to the left.  This was carried down to the level of the anterior rectus sheath.  Fascia was mobilized off the anterior rectus sheath.  Next the ear rectus sheath was opened in line with the skin incision.  The rectus muscle was mobilized circumferentially.  Retroperitoneal space was entered in the left lower quadrant and the peritoneal contents were mobilized to the right.  The left ureter was mobilized to the right as well.  Dissection was taken down onto the iliac vessels overlying the L5-S1 disc.  The iliac vessels were mobilized to the right and left.  The middle sacral vessels were clipped and divided.  Blunt dissection was continued to the right and left of the disc and superiorly and inferiorly to give adequate anterior exposure.  The reverse lip 200 blade was positioned to the right of the L5-S1 disc.  The malleable 140 blade was positioned to the left of the L5-S1 disc.  The malleable blades were positioned for superior and inferior exposure as  well.  Spinal needle was placed in the disc and C-arm was brought back onto the table to confirm that this was the appropriate level.  The remainder of the dictation will be dictated as a separate note with Dr. Levora Dredge, M.D., [facility] 09/05/2017 11:48 AM

## 2017-09-05 NOTE — Anesthesia Procedure Notes (Signed)
Procedure Name: Intubation Date/Time: 09/05/2017 8:01 AM Performed by: Shirlyn Goltz, CRNA Pre-anesthesia Checklist: Patient identified, Emergency Drugs available, Suction available and Patient being monitored Patient Re-evaluated:Patient Re-evaluated prior to induction Oxygen Delivery Method: Circle system utilized Preoxygenation: Pre-oxygenation with 100% oxygen Induction Type: IV induction Ventilation: Mask ventilation without difficulty Laryngoscope Size: Glidescope and 4 Grade View: Grade I Tube type: Oral Tube size: 7.5 mm Number of attempts: 1 Airway Equipment and Method: Video-laryngoscopy and Rigid stylet (elective  for difficult intubation history) Placement Confirmation: ETT inserted through vocal cords under direct vision,  positive ETCO2 and breath sounds checked- equal and bilateral Secured at: 23 cm Tube secured with: Tape Dental Injury: Teeth and Oropharynx as per pre-operative assessment

## 2017-09-05 NOTE — Op Note (Signed)
NAME:  Walter White                MEDICAL RECORD NO.:  [id number]  PHYSICIAN:  Phylliss Bob, MD      DATE OF BIRTH:  June 21, 1950  DATE OF PROCEDURE:  09/05/2017                              OPERATIVE REPORT   PREOPERATIVE DIAGNOSES: 1. Severe low back pain due to facet arthropathy at L5/S1 2. S/p previous L3-L5 fusion  POSTOPERATIVE DIAGNOSES: 1. Severe low back pain due to facet arthropathy at L5/S1 2. S/p previous L3-L5 fusion  PROCEDURE: 1. Anterior lumbar interbody fusion, L5-S1 (exposure peformed by Dr. Sherren Mocha Early) 2. Insertion of interbody device x1 (Cougar intervertebral spacer, 34mm, large, 10 degree lordotic). 3. Placement of anterior instrumentation securing the L5-S1 level. 4. Intraoperative use of fluoroscopy. 5. Use of morselized allograft - Vivigen  SURGEON:  Phylliss Bob, MD  ASSISTANT:  Pricilla Holm, PA-C  ANESTHESIA:  General endotracheal anesthesia.  COMPLICATIONS:  None.  DISPOSITION:  Stable.  ESTIMATED BLOOD LOSS:  minimal  INDICATIONS FOR SURGERY:  Briefly, Walter White is a very pleasant 67 year old male, who has been having progressive debilitating pain in the low back.  This did progress significantly.  This was worked up, and the work-up did reveal facet arthropathy at L5-S1.  The patient did get excellent 100% pain relief with diagnostic injections.  He did have extensive conservative care for this, including an RFA procedure, however, the procedure did not alleviate his symptoms. Given his ongoing pain and dysfunction, we did discuss proceeding with the procedure noted above.  The patient was fully aware of the risks and limitations associated with surgery, and did wish to proceed.  The plan was to proceed with stage I of his procedure today, and to return tomorrow for stage II, specifically, a posterior fusion procedure.    OPERATIVE DETAILS:  On 08/10/2017, the patient was brought to surgery and general endotracheal anesthesia was  administered.  The patient was placed supine on the hospital bed.  The patient's abdomen was prepped and draped in the usual sterile fashion.  An anterior retroperitoneal approach was then performed by Dr. Curt Jews. Once the anterior lumbar spine was noted, we did focus our attention on the L5-S1 intervertebral space.  I then performed a thorough and complete L5-S1 intervertebral diskectomy to the level of the posterior longitudinal ligament.  I was very pleased with the diskectomy that I was able to accomplish.  The endplates were then appropriately prepared and the appropriate sized anterior intervertebral spacer was packed with Vivigen and tamped into position. I was very pleased with the press-fit of the implant.  I then proceeded with placement of anterior instrumentation at the L5-S1 intervertebral space.  To accomplish this, I did use an awl to prepare the trajectory of the S1 anterior vertebral body screw.  An anterior fixation device was attached to the back end of the screw and did provide anterior fixation across the L5-S1 intervertebral space, securing the intervertebral implant into place.  I was very pleased with the press-fit of the anterior hardware.  I did liberally use AP and lateral fluoroscopy to ensure that the implant and anterior fixation was in the appropriate position, and was very pleased with the radiographs. The wound was copiously irrigated.  The fascia was  closed using #1 PDS.  The subcutaneous layer was closed using 0 Vicryl  followed by 2-0 Vicryl, and the skin was closed using 4-0 Monocryl. Benzoin and Steri-Strips were applied followed by sterile dressing.   All instrument counts were correct at the termination of the procedure. I did use neurologic monitoring throughout the surgery, and there was no abnormal EMG activity noted throughout the surgery.  Of note, Pricilla Holm was my assistant throughout surgery, and did aid in retraction, suctioning, and  closure for both the anterior and posterior portions of the procedure.     Phylliss Bob, MD

## 2017-09-06 ENCOUNTER — Other Ambulatory Visit: Payer: Self-pay

## 2017-09-06 ENCOUNTER — Inpatient Hospital Stay (HOSPITAL_COMMUNITY): Payer: Medicare Other

## 2017-09-06 ENCOUNTER — Inpatient Hospital Stay (HOSPITAL_COMMUNITY): Admission: RE | Admit: 2017-09-06 | Payer: Medicare Other | Source: Ambulatory Visit | Admitting: Orthopedic Surgery

## 2017-09-06 ENCOUNTER — Inpatient Hospital Stay (HOSPITAL_COMMUNITY): Payer: Medicare Other | Admitting: Anesthesiology

## 2017-09-06 ENCOUNTER — Inpatient Hospital Stay (HOSPITAL_COMMUNITY)
Admission: RE | Disposition: A | Discharge status: Home / Self Care | Payer: Self-pay | Source: Home / Self Care | Attending: Orthopedic Surgery

## 2017-09-06 ENCOUNTER — Encounter (HOSPITAL_COMMUNITY): Payer: Self-pay | Admitting: Orthopedic Surgery

## 2017-09-06 LAB — POCT I-STAT 4, (NA,K, GLUC, HGB,HCT)
Glucose, Bld: 139 mg/dL — ABNORMAL HIGH (ref 70–99)
HCT: 33 % — ABNORMAL LOW (ref 39.0–52.0)
Hemoglobin: 11.2 g/dL — ABNORMAL LOW (ref 13.0–17.0)
Potassium: 3.4 mmol/L — ABNORMAL LOW (ref 3.5–5.1)
Sodium: 139 mmol/L (ref 135–145)

## 2017-09-06 SURGERY — POSTERIOR LUMBAR FUSION 1 LEVEL
Anesthesia: General

## 2017-09-06 MED ORDER — PROPOFOL 10 MG/ML IV BOLUS
INTRAVENOUS | Status: DC | PRN
  Administered 2017-09-06: 200 mg via INTRAVENOUS

## 2017-09-06 MED ORDER — BUPIVACAINE LIPOSOME 1.3 % IJ SUSP
20.0000 mL | Freq: Once | INTRAMUSCULAR | Status: DC
  Filled 2017-09-06: qty 20

## 2017-09-06 MED ORDER — ROCURONIUM BROMIDE 50 MG/5ML IV SOSY
PREFILLED_SYRINGE | INTRAVENOUS | Status: AC
  Filled 2017-09-06: qty 5

## 2017-09-06 MED ORDER — VANCOMYCIN HCL 10 G IV SOLR
1500.0000 mg | INTRAVENOUS | Status: AC
  Administered 2017-09-06: 1500 mg via INTRAVENOUS
  Filled 2017-09-06: qty 1500

## 2017-09-06 MED ORDER — SUGAMMADEX SODIUM 500 MG/5ML IV SOLN
INTRAVENOUS | Status: AC
  Filled 2017-09-06: qty 5

## 2017-09-06 MED ORDER — SUCCINYLCHOLINE CHLORIDE 200 MG/10ML IV SOSY
PREFILLED_SYRINGE | INTRAVENOUS | Status: AC
  Filled 2017-09-06: qty 10

## 2017-09-06 MED ORDER — PHENYLEPHRINE 40 MCG/ML (10ML) SYRINGE FOR IV PUSH (FOR BLOOD PRESSURE SUPPORT)
PREFILLED_SYRINGE | INTRAVENOUS | Status: AC
  Filled 2017-09-06: qty 10

## 2017-09-06 MED ORDER — THROMBIN (RECOMBINANT) 20000 UNITS EX SOLR
CUTANEOUS | Status: AC
  Filled 2017-09-06: qty 20000

## 2017-09-06 MED ORDER — 0.9 % SODIUM CHLORIDE (POUR BTL) OPTIME
TOPICAL | Status: DC | PRN
  Administered 2017-09-06: 1000 mL

## 2017-09-06 MED ORDER — MIDAZOLAM HCL 5 MG/5ML IJ SOLN
INTRAMUSCULAR | Status: DC | PRN
  Administered 2017-09-06: 2 mg via INTRAVENOUS

## 2017-09-06 MED ORDER — HYDROMORPHONE HCL 1 MG/ML IJ SOLN: 0.2500 mg | INTRAMUSCULAR | Status: DC | PRN

## 2017-09-06 MED ORDER — BUPIVACAINE LIPOSOME 1.3 % IJ SUSP
INTRAMUSCULAR | Status: DC | PRN
  Administered 2017-09-06: 20 mL

## 2017-09-06 MED ORDER — FENTANYL CITRATE (PF) 100 MCG/2ML IJ SOLN
INTRAMUSCULAR | Status: DC | PRN
  Administered 2017-09-06: 100 ug via INTRAVENOUS
  Administered 2017-09-06 (×4): 50 ug via INTRAVENOUS
  Administered 2017-09-06: 100 ug via INTRAVENOUS

## 2017-09-06 MED ORDER — METHYLENE BLUE 0.5 % INJ SOLN
INTRAVENOUS | Status: AC
  Filled 2017-09-06: qty 10

## 2017-09-06 MED ORDER — MIDAZOLAM HCL 2 MG/2ML IJ SOLN
INTRAMUSCULAR | Status: AC
  Filled 2017-09-06: qty 2

## 2017-09-06 MED ORDER — BUPIVACAINE-EPINEPHRINE 0.25% -1:200000 IJ SOLN
INTRAMUSCULAR | Status: DC | PRN
  Administered 2017-09-06: 30 mL

## 2017-09-06 MED ORDER — ROCURONIUM BROMIDE 50 MG/5ML IV SOSY
PREFILLED_SYRINGE | INTRAVENOUS | Status: DC | PRN
  Administered 2017-09-06 (×3): 50 mg via INTRAVENOUS

## 2017-09-06 MED ORDER — ONDANSETRON HCL 4 MG/2ML IJ SOLN: 4.0000 mg | Freq: Once | INTRAMUSCULAR | Status: DC | PRN

## 2017-09-06 MED ORDER — PROPOFOL 500 MG/50ML IV EMUL
INTRAVENOUS | Status: DC | PRN
  Administered 2017-09-06: 10:00:00 via INTRAVENOUS
  Administered 2017-09-06: 100 ug/kg/min via INTRAVENOUS

## 2017-09-06 MED ORDER — LIDOCAINE 2% (20 MG/ML) 5 ML SYRINGE
INTRAMUSCULAR | Status: AC
  Filled 2017-09-06: qty 5

## 2017-09-06 MED ORDER — PHENYLEPHRINE HCL 10 MG/ML IJ SOLN
INTRAMUSCULAR | Status: AC
  Filled 2017-09-06: qty 2

## 2017-09-06 MED ORDER — ONDANSETRON HCL 4 MG/2ML IJ SOLN
INTRAMUSCULAR | Status: DC | PRN
  Administered 2017-09-06: 4 mg via INTRAVENOUS

## 2017-09-06 MED ORDER — PHENYLEPHRINE HCL 10 MG/ML IJ SOLN
INTRAMUSCULAR | Status: DC | PRN
  Administered 2017-09-06 (×2): 200 ug via INTRAVENOUS

## 2017-09-06 MED ORDER — SODIUM CHLORIDE 0.9 % IV SOLN
INTRAVENOUS | Status: DC | PRN
  Administered 2017-09-06: 10:00:00 via INTRAVENOUS
  Administered 2017-09-06: 50 ug/min via INTRAVENOUS

## 2017-09-06 MED ORDER — FENTANYL CITRATE (PF) 250 MCG/5ML IJ SOLN
INTRAMUSCULAR | Status: AC
  Filled 2017-09-06: qty 5

## 2017-09-06 MED ORDER — LIDOCAINE 2% (20 MG/ML) 5 ML SYRINGE
INTRAMUSCULAR | Status: DC | PRN
  Administered 2017-09-06: 80 mg via INTRAVENOUS

## 2017-09-06 MED ORDER — HEMOSTATIC AGENTS (NO CHARGE) OPTIME
TOPICAL | Status: DC | PRN
  Administered 2017-09-06: 1 via TOPICAL

## 2017-09-06 MED ORDER — DEXAMETHASONE SODIUM PHOSPHATE 10 MG/ML IJ SOLN
INTRAMUSCULAR | Status: AC
  Filled 2017-09-06: qty 1

## 2017-09-06 MED ORDER — BUPIVACAINE-EPINEPHRINE (PF) 0.25% -1:200000 IJ SOLN
INTRAMUSCULAR | Status: AC
  Filled 2017-09-06: qty 30

## 2017-09-06 MED ORDER — PROPOFOL 10 MG/ML IV BOLUS
INTRAVENOUS | Status: AC
  Filled 2017-09-06: qty 20

## 2017-09-06 MED ORDER — THROMBIN 20000 UNITS EX SOLR
CUTANEOUS | Status: DC | PRN
  Administered 2017-09-06: 20000 [IU] via TOPICAL

## 2017-09-06 MED ORDER — METHYLENE BLUE 0.5 % INJ SOLN
INTRAVENOUS | Status: DC | PRN
  Administered 2017-09-06: 1 mL

## 2017-09-06 MED ORDER — ONDANSETRON HCL 4 MG/2ML IJ SOLN
INTRAMUSCULAR | Status: AC
  Filled 2017-09-06: qty 2

## 2017-09-06 MED ORDER — SUGAMMADEX SODIUM 200 MG/2ML IV SOLN
INTRAVENOUS | Status: DC | PRN
  Administered 2017-09-06: 220 mg via INTRAVENOUS

## 2017-09-06 MED ORDER — DEXAMETHASONE SODIUM PHOSPHATE 10 MG/ML IJ SOLN
INTRAMUSCULAR | Status: DC | PRN
  Administered 2017-09-06: 10 mg via INTRAVENOUS

## 2017-09-06 MED ORDER — LACTATED RINGERS IV SOLN
INTRAVENOUS | Status: DC | PRN
  Administered 2017-09-06 (×3): via INTRAVENOUS

## 2017-09-06 MED FILL — Sodium Chloride IV Soln 0.9%: INTRAVENOUS | Qty: 1000 | Status: AC

## 2017-09-06 MED FILL — Thrombin (Recombinant) For Soln 20000 Unit: CUTANEOUS | Qty: 1 | Status: AC

## 2017-09-06 MED FILL — Heparin Sodium (Porcine) Inj 1000 Unit/ML: INTRAMUSCULAR | Qty: 30 | Status: AC

## 2017-09-06 SURGICAL SUPPLY — 93 items
BENZOIN TINCTURE PRP APPL 2/3 (GAUZE/BANDAGES/DRESSINGS) ×3 IMPLANT
BLADE CLIPPER SURG (BLADE) IMPLANT
BONE VIVIGEN FORMABLE 5.4CC (Bone Implant) ×3 IMPLANT
BUR MATCHSTICK NEURO 3.0 LAGG (BURR) ×3 IMPLANT
BUR PRESCISION 1.7 ELITE (BURR) ×3 IMPLANT
BUR ROUND FLUTED 5 RND (BURR) ×2 IMPLANT
BUR ROUND FLUTED 5MM RND (BURR) ×1
BUR ROUND PRECISION 4.0 (BURR) IMPLANT
BUR ROUND PRECISION 4.0MM (BURR)
BUR SABER RD CUTTING 3.0 (BURR) IMPLANT
BUR SABER RD CUTTING 3.0MM (BURR)
CARTRIDGE OIL MAESTRO DRILL (MISCELLANEOUS) ×1 IMPLANT
CLOSURE WOUND 1/2 X4 (GAUZE/BANDAGES/DRESSINGS) ×1
CONT SPEC 4OZ CLIKSEAL STRL BL (MISCELLANEOUS) ×3 IMPLANT
COVER MAYO STAND STRL (DRAPES) ×6 IMPLANT
COVER SURGICAL LIGHT HANDLE (MISCELLANEOUS) ×6 IMPLANT
DIFFUSER DRILL AIR PNEUMATIC (MISCELLANEOUS) ×3 IMPLANT
DRAIN CHANNEL 15F RND FF W/TCR (WOUND CARE) IMPLANT
DRAPE C-ARM 42X72 X-RAY (DRAPES) ×3 IMPLANT
DRAPE C-ARMOR (DRAPES) IMPLANT
DRAPE POUCH INSTRU U-SHP 10X18 (DRAPES) ×3 IMPLANT
DRAPE SURG 17X23 STRL (DRAPES) ×12 IMPLANT
DURAPREP 26ML APPLICATOR (WOUND CARE) ×3 IMPLANT
ELECT BLADE 4.0 EZ CLEAN MEGAD (MISCELLANEOUS) ×3
ELECT CAUTERY BLADE 6.4 (BLADE) ×3 IMPLANT
ELECT REM PT RETURN 9FT ADLT (ELECTROSURGICAL) ×3
ELECTRODE BLDE 4.0 EZ CLN MEGD (MISCELLANEOUS) ×1 IMPLANT
ELECTRODE REM PT RTRN 9FT ADLT (ELECTROSURGICAL) ×1 IMPLANT
EVACUATOR SILICONE 100CC (DRAIN) IMPLANT
FEE INTRAOP MONITOR IMPULS NCS (MISCELLANEOUS) ×1 IMPLANT
FILTER STRAW FLUID ASPIR (MISCELLANEOUS) ×3 IMPLANT
GAUZE 4X4 16PLY RFD (DISPOSABLE) ×3 IMPLANT
GAUZE SPONGE 4X4 12PLY STRL (GAUZE/BANDAGES/DRESSINGS) ×3 IMPLANT
GLOVE BIO SURGEON STRL SZ7 (GLOVE) ×3 IMPLANT
GLOVE BIO SURGEON STRL SZ8 (GLOVE) ×3 IMPLANT
GLOVE BIOGEL PI IND STRL 7.0 (GLOVE) ×1 IMPLANT
GLOVE BIOGEL PI IND STRL 8 (GLOVE) ×1 IMPLANT
GLOVE BIOGEL PI INDICATOR 7.0 (GLOVE) ×2
GLOVE BIOGEL PI INDICATOR 8 (GLOVE) ×2
GOWN STRL REUS W/ TWL LRG LVL3 (GOWN DISPOSABLE) ×2 IMPLANT
GOWN STRL REUS W/ TWL XL LVL3 (GOWN DISPOSABLE) ×1 IMPLANT
GOWN STRL REUS W/TWL LRG LVL3 (GOWN DISPOSABLE) ×4
GOWN STRL REUS W/TWL XL LVL3 (GOWN DISPOSABLE) ×2
INTRAOP MONITOR FEE IMPULS NCS (MISCELLANEOUS) ×1
INTRAOP MONITOR FEE IMPULSE (MISCELLANEOUS) ×2
IV CATH 14GX2 1/4 (CATHETERS) ×3 IMPLANT
KIT BASIN OR (CUSTOM PROCEDURE TRAY) ×3 IMPLANT
KIT POSITION SURG JACKSON T1 (MISCELLANEOUS) ×3 IMPLANT
KIT TURNOVER KIT B (KITS) ×3 IMPLANT
MARKER SKIN DUAL TIP RULER LAB (MISCELLANEOUS) ×6 IMPLANT
NDL SAFETY ECLIPSE 18X1.5 (NEEDLE) ×1 IMPLANT
NEEDLE 22X1 1/2 (OR ONLY) (NEEDLE) ×6 IMPLANT
NEEDLE HYPO 18GX1.5 SHARP (NEEDLE) ×2
NEEDLE HYPO 25GX1X1/2 BEV (NEEDLE) ×3 IMPLANT
NEEDLE SPNL 18GX3.5 QUINCKE PK (NEEDLE) ×6 IMPLANT
NS IRRIG 1000ML POUR BTL (IV SOLUTION) ×3 IMPLANT
OIL CARTRIDGE MAESTRO DRILL (MISCELLANEOUS) ×3
PACK LAMINECTOMY ORTHO (CUSTOM PROCEDURE TRAY) ×3 IMPLANT
PACK UNIVERSAL I (CUSTOM PROCEDURE TRAY) ×3 IMPLANT
PAD ARMBOARD 7.5X6 YLW CONV (MISCELLANEOUS) ×6 IMPLANT
PATTIES SURGICAL .5 X1 (DISPOSABLE) ×3 IMPLANT
PATTIES SURGICAL .5X1.5 (GAUZE/BANDAGES/DRESSINGS) ×3 IMPLANT
PROBE PED EXPEDIUM STRT (INSTRUMENTS) ×3 IMPLANT
PROBE PEDCLE PROBE MAGSTM DISP (MISCELLANEOUS) ×3 IMPLANT
ROD EXPEDIUM 5.5X300MM (Rod) ×3 IMPLANT
SCREW CORTICAL VIPER 7X35 (Screw) ×6 IMPLANT
SCREW CORTICAL VIPER 7X40MM (Screw) ×3 IMPLANT
SCREW ILIAC VIPER TI 8X70 (Screw) ×6 IMPLANT
SCREW SET SINGLE INNER (Screw) ×24 IMPLANT
SCREW VIPER 8X40 TI CORTI (Screw) IMPLANT
SCREW VIPER 8X45 TI CORTI (Screw) ×3 IMPLANT
SCREW VIPER 8X80 TI CORTI T27 (Screw) IMPLANT
SPONGE INTESTINAL PEANUT (DISPOSABLE) ×3 IMPLANT
SPONGE SURGIFOAM ABS GEL 100 (HEMOSTASIS) ×3 IMPLANT
STRIP CLOSURE SKIN 1/2X4 (GAUZE/BANDAGES/DRESSINGS) ×2 IMPLANT
STYLET INTUB SATIN SLIP 14FR (MISCELLANEOUS) ×3 IMPLANT
SURGIFLO W/THROMBIN 8M KIT (HEMOSTASIS) IMPLANT
SUT MNCRL AB 4-0 PS2 18 (SUTURE) ×3 IMPLANT
SUT VIC AB 0 CT1 18XCR BRD 8 (SUTURE) ×1 IMPLANT
SUT VIC AB 0 CT1 8-18 (SUTURE) ×2
SUT VIC AB 1 CT1 18XCR BRD 8 (SUTURE) ×1 IMPLANT
SUT VIC AB 1 CT1 8-18 (SUTURE) ×2
SUT VIC AB 2-0 CT2 18 VCP726D (SUTURE) ×9 IMPLANT
SYR 20CC LL (SYRINGE) ×6 IMPLANT
SYR BULB IRRIGATION 50ML (SYRINGE) ×3 IMPLANT
SYR CONTROL 10ML LL (SYRINGE) ×6 IMPLANT
SYR TB 1ML LUER SLIP (SYRINGE) ×3 IMPLANT
TAP CANN VIPER2 DL 6.0 (TAP) ×3 IMPLANT
TAP CANN VIPER2 DL 7.0 (TAP) ×3 IMPLANT
TAPE CLOTH SURG 4X10 WHT LF (GAUZE/BANDAGES/DRESSINGS) ×3 IMPLANT
TRAY FOLEY MTR SLVR 16FR STAT (SET/KITS/TRAYS/PACK) ×3 IMPLANT
WATER STERILE IRR 1000ML POUR (IV SOLUTION) ×3 IMPLANT
YANKAUER SUCT BULB TIP NO VENT (SUCTIONS) ×3 IMPLANT

## 2017-09-06 NOTE — Progress Notes (Signed)
Patient ID: Walter White., male   DOB: [birth year], 67 y.o.   MRN: [id number] Comfortable this morning In short stay preoperative area planning for posterior fusion today. No nausea or vomiting. Typical soreness in lower abdomen.  Dressing intact. Palpable pedal pulses. Stable postop day 1 from anterior exposure for L5-S1 fusion.  For posterior approach today.  Plan per Dr. Lynann Bologna.  Will not follow actively.

## 2017-09-06 NOTE — Anesthesia Postprocedure Evaluation (Signed)
Anesthesia Post Note  Patient: Walter White.  Procedure(s) Performed: LUMBAR 5 - SACRUM 1 POSTERIOR SPINAL FUSION WITH INSTRUMENTATION AND ALLOGRAFT (N/A )     Patient location during evaluation: PACU Anesthesia Type: General Level of consciousness: awake and alert Pain management: pain level controlled Vital Signs Assessment: post-procedure vital signs reviewed and stable Respiratory status: spontaneous breathing, nonlabored ventilation, respiratory function stable and patient connected to nasal cannula oxygen Cardiovascular status: blood pressure returned to baseline and stable Postop Assessment: no apparent nausea or vomiting Anesthetic complications: no    Last Vitals:  Vitals:   09/06/17 1410 09/06/17 1415  BP:    Pulse: 70 78  Resp: 12   Temp:  36.6 C  SpO2: 100% 100%    Last Pain:  Vitals:   09/06/17 1253  TempSrc:   PainSc: [name]

## 2017-09-06 NOTE — Transfer of Care (Signed)
Immediate Anesthesia Transfer of Care Note  Patient: Walter White.  Procedure(s) Performed: LUMBAR 5 - SACRUM 1 POSTERIOR SPINAL FUSION WITH INSTRUMENTATION AND ALLOGRAFT (N/A )  Patient Location: PACU  Anesthesia Type:General  Level of Consciousness: awake and patient cooperative  Airway & Oxygen Therapy: Patient Spontanous Breathing  Post-op Assessment: Report given to RN and Post -op Vital signs reviewed and stable  Post vital signs: Reviewed and stable  Last Vitals:  Vitals Value Taken Time  BP 109/55 09/06/2017 12:53 PM  Temp    Pulse 89 09/06/2017 12:54 PM  Resp 18 09/06/2017 12:54 PM  SpO2 93 % 09/06/2017 12:54 PM  Vitals shown include unvalidated device data.  Last Pain:  Vitals:   09/06/17 0547  TempSrc: Oral  PainSc:       Patients Stated Pain Goal: 4 (36/64/40 3474)  Complications: No apparent anesthesia complications

## 2017-09-06 NOTE — Evaluation (Signed)
Physical Therapy Evaluation Patient Details Name: Walter White. MRN: [id number] DOB: May 23, 1950 Today's Date: 09/06/2017   History of Present Illness  pt is a 67 y/o male with pmh significant for HTN, OAo knee and lumbar surgeries admitted for elective lumbosacral fusion surgery due to ongoing pain in the low back.  MRI reveals DDD and facet arthropathy.  Pt s/p 2 stage intervention--anterior exposure L5-S1 discectomy and Post fusion L5-S1 with sigmental fixation from L3-S1 to pelvis with autografting.  Clinical Impression  Pt is close to baseline functioning, all education completed and should be safe at home . There are no further acute PT needs.  Will sign off at this time.     Follow Up Recommendations No PT follow up    Equipment Recommendations  None recommended by PT    Recommendations for Other Services       Precautions / Restrictions Precautions Precautions: Back Required Braces or Orthoses: Spinal Brace Spinal Brace: Thoracolumbosacral orthotic;Applied in sitting position      Mobility  Bed Mobility Overal bed mobility: Needs Assistance Bed Mobility: Rolling;Sidelying to Sit;Sit to Sidelying Rolling: Supervision Sidelying to sit: Supervision     Sit to sidelying: Supervision General bed mobility comments: demo'd and practiced safe technique until pt safe at supervision  Transfers Overall transfer level: Needs assistance   Transfers: Sit to/from Stand Sit to Stand: Supervision         General transfer comment: cues for hand placement  Ambulation/Gait Ambulation/Gait assistance: Supervision Gait Distance (Feet): 200 Feet Assistive device: None Gait Pattern/deviations: Step-through pattern   Gait velocity interpretation: 1.31 - 2.62 ft/sec, indicative of limited community ambulator General Gait Details: Episodes of mild unsteadiness due to learned tendency to walk on his heels to decrease pain.  Improved pattern and stability with time  up.  Stairs Stairs: Yes   Stair Management: One rail Right;Alternating pattern;Forwards Number of Stairs: 5 General stair comments: safe with rail  Wheelchair Mobility    Modified Rankin (Stroke Patients Only)       Balance Overall balance assessment: Needs assistance Sitting-balance support: No upper extremity supported Sitting balance-Leahy Scale: Good       Standing balance-Leahy Scale: Good                               Pertinent Vitals/Pain Pain Assessment: 0-10 Pain Score: 5  Pain Location: back incision Pain Descriptors / Indicators: Discomfort Pain Intervention(s): Monitored during session    Home Living Family/patient expects to be discharged to:: Private residence Living Arrangements: Spouse/significant other Available Help at Discharge: Family;Available 24 hours/day Type of Home: House Home Access: Stairs to enter   [employer] of Steps: 1 Home Layout: One level Home Equipment: Walker - 2 wheels;Bedside commode      Prior Function Level of Independence: Independent               Hand Dominance   Dominant Hand: Right    Extremity/Trunk Assessment   Upper Extremity Assessment Upper Extremity Assessment: Overall WFL for tasks assessed    Lower Extremity Assessment Lower Extremity Assessment: Overall WFL for tasks assessed       Communication   Communication: No difficulties  Cognition Arousal/Alertness: Awake/alert Behavior During Therapy: WFL for tasks assessed/performed Overall Cognitive Status: Within Functional Limits for tasks assessed  General Comments General comments (skin integrity, edema, etc.): pt and wife instructed in back care/prec, log roll, bracing issues, lifting restrictions and progression of activity    Exercises     Assessment/Plan    PT Assessment Patent does not need any further PT services  PT Problem List         PT  Treatment Interventions      PT Goals (Current goals can be found in the Care Plan section)  Acute Rehab PT Goals PT Goal Formulation: All assessment and education complete, DC therapy    Frequency     Barriers to discharge        Co-evaluation               AM-PAC PT "6 Clicks" Daily Activity  Outcome Measure Difficulty turning over in bed (including adjusting bedclothes, sheets and blankets)?: A Little Difficulty moving from lying on back to sitting on the side of the bed? : A Little Difficulty sitting down on and standing up from a chair with arms (e.g., wheelchair, bedside commode, etc,.)?: A Little Help needed moving to and from a bed to chair (including a wheelchair)?: A Little Help needed walking in hospital room?: A Little Help needed climbing 3-5 steps with a railing? : A Little 6 Click Score: 18    End of Session   Activity Tolerance: Patient tolerated treatment well Patient left: in chair;with call bell/phone within reach Nurse Communication: Mobility status PT Visit Diagnosis: Other abnormalities of gait and mobility (R26.89);Pain Pain - part of body: (back)    Time: 4235-3614 PT Time Calculation (min) (ACUTE ONLY): 42 min   Charges:   PT Evaluation $PT Eval Low Complexity: 1 Low PT Treatments $Gait Training: 8-22 mins $Self Care/Home Management: 8-22        09/06/2017  Donnella Sham, PT [phone]  (pager)  Tessie Fass Jadwiga Faidley 09/06/2017, 6:16 PM

## 2017-09-06 NOTE — Anesthesia Procedure Notes (Signed)
Procedure Name: Intubation Date/Time: 09/06/2017 7:46 AM Performed by: Lance Coon, CRNA Pre-anesthesia Checklist: Patient identified, Emergency Drugs available, Suction available, Patient being monitored and Timeout performed Patient Re-evaluated:Patient Re-evaluated prior to induction Oxygen Delivery Method: Circle system utilized Preoxygenation: Pre-oxygenation with 100% oxygen Induction Type: IV induction Ventilation: Mask ventilation without difficulty Laryngoscope Size: Glidescope and 4 Grade View: Grade I Tube type: Oral Tube size: 7.5 mm Number of attempts: 1 Airway Equipment and Method: Stylet Placement Confirmation: ETT inserted through vocal cords under direct vision,  positive ETCO2 and breath sounds checked- equal and bilateral Secured at: 22 cm Tube secured with: Tape Dental Injury: Teeth and Oropharynx as per pre-operative assessment

## 2017-09-06 NOTE — H&P (Signed)
Patient presents for stage 2 of his procedure today. He tolerated stage 1 will. Will proceed as planned.

## 2017-09-06 NOTE — Op Note (Signed)
NAME:  Walter White            MEDICAL RECORD NO.: [id number]   PHYSICIAN:  Phylliss Bob, MD      DATE OF BIRTH:  06-24-1950   DATE OF PROCEDURE:  09/06/2017                               OPERATIVE REPORT   PREOPERATIVE DIAGNOSES: 1.   Status post previous L3-L5 fusion with instrumentation 2.   Status post L5-S1 anterior lumbar interbody fusion, with requiring posterior fusion with instrumentation  POSTOPERATIVE DIAGNOSES:   1.   Status post previous L3-L5 fusion with instrumentation 2.   Status post L5-S1 anterior lumbar interbody fusion, with requiring posterior fusion and instrumentation  PROCEDURE (Stage 2): 1. Posterior spinal fusion, L5-S1. 2. Placement of segmental instrumentation, spanning L3-S1, to the pelvis 3. Placement of posterior pelvic instrumentation 4. Exploration of previous spinal fusion, L3-4, L4-5 5.  Use of local autograft, with use of morselized allograft - Vivigen  SURGEON:  Phylliss Bob, MD  ASSISTANT:  Pricilla Holm, PA-C  ANESTHESIA:  General endotracheal anesthesia.  COMPLICATIONS:  None.  DISPOSITION:  Stable.  ESTIMATED BLOOD LOSS:  200 cc  INDICATIONS FOR SURGERY:  Briefly, Mr. Zaino is one day status post an anterior lumbar interbody fusion.  Please refer to my operative report dated 09/05/2017 for additional details regarding that surgery.  The patient did present today for stage 2 of what was to be a two-stage procedure, specifically, and L5-S1 posterior fusion, with instrumentation to the pelvis.  OPERATIVE DETAILS:  On 09/06/2017, the patient was brought to surgery and general endotracheal anesthesia was administered.  The patient was placed prone onto a Jackson spinal bed.  The back was then prepped and draped in the usual sterile fashion.   The patient's previous incision was utilized, and extended slightly more inferiorly.  The paraspinal musculature was incised in the midline, and was bluntly retracted.  The previously placed  instrumentation spanning L3-L5 was identified.  The capsule spanning L3-L5 were removed, as was the interconnecting rod.  I did explore the fusion mass, grabbing hold of the L3, L4, and L5 pedicle screws, and performing a pushing and pulling maneuver.  There is no motion noted, which did confirm a successful fusion at L3-4 and L4-5.  At this point, I surprised to expose the L5-S1 facet joints bilaterally.  A high-speed bur was used to decorticate the bilateral L5-S1 facet joints and posterior lateral gutters.  Using AP and lateral fluoroscopy, I did cannulate the S1 pedicles, using a medial to lateral trajectory technique.  Then, again liberally using fluoroscopy, I did cannulate the S2 level, using an awl, advanced across the sacroiliac joint, and into the ileum.  Again, fluoroscopy was used liberally.  This was done on the right and left sides.  I then placed an 8 x 45 mm screw on the right at S1, and a 7 x 40 mm screw on the left at S1.  8 x 70 mm screws were placed bilaterally through the sacrum, and into the pelvis.  I then remove the bilateral L5 pedicle screws, which were upsized to 7 mm diameter screws.  At this point, rods were cut to the appropriate length, and bent to the appropriate degree of lordosis, and secured into the tulip heads of the screws spanning L3 to the pelvis.  Caps were placed in a final locking procedure was performed.  At  this point, I liberally packed autograft obtained from the L5 level and L5-S1 facet joints, as well as allograft, into the posterior lateral gutters and facet joints at the L5-S1 level.  I was very pleased with the final AP and lateral fluoroscopic images.  The wound was copiously irrigated throughout the surgery, with a total of approximately 3 L of normal saline.  I did test the L5, S1, and S2 screws on the left using triggered EMG.  There is no screw the tested below 20 mA. The fascia was closed using #1 Vicryl.  The subcutaneous layer was closed using 0  Vicryl followed by 2-0 Vicryl, and the skin was then closed using 4-0 Monocryl. Benzoin and Steri-Strips were applied followed by sterile dressing.  All instrument counts were correct at the termination of the procedure. Again, I did use neurologic monitoring throughout the surgery, and there was no abnormal EMG activity noted throughout the surgery.  Of note, Pricilla Holm was my assistant throughout surgery, and did aid in retraction, suctioning, and closure for both the anterior and posterior portions of the procedure.     Phylliss Bob, MD

## 2017-09-07 NOTE — Progress Notes (Signed)
    Patient doing excellent PO day 1/2 s/p staged lumbar fusion procedure. He has minimal LBP and has been up and ambulating, he is eager to progress home. NL B/B function, he is eating and drinking normally, he denies SOB/fatigue/lightheadedness.    Physical Exam: BP (!) 74/54 (BP Location: Right Arm)   Pulse 94   Temp 98.2 F (36.8 C) (Oral)   Resp 16   Ht 5' 7.5" (1.715 m)   Wt 106.1 kg   SpO2 98%   BMI 36.11 kg/m    Dressing in place, CDI ANT and POST, TLSO brace adjusted and worn appropriately  NVI  POD #1 & 2 s/p ANT/POST staged lumbar fusion doing well  - up with PT/OT, encourage ambulation - Percocet for pain, Valium for muscle spasms - likely d/c home today with f/u in 2 weeks

## 2017-09-07 NOTE — Plan of Care (Signed)

## 2017-09-07 NOTE — Evaluation (Signed)
Occupational Therapy Evaluation Patient Details Name: Walter White. MRN: [id number] DOB: 15-Jun-1950 Today's Date: 09/07/2017    History of Present Illness Walter White is a 67 y/o male with pmh significant for HTN, OA knee and lumbar surgeries admitted for elective lumbosacral fusion surgery due to ongoing pain in the low back.  MRI reveals DDD and facet arthropathy.  Pt s/p 2 stage intervention--anterior exposure L5-S1 discectomy and Post fusion L5-S1 with sigmental fixation from L3-S1 to pelvis with autografting.   Clinical Impression   Pt s/p spinal sx and now presenting with post op pain. Pt is close to baseline levels with his ADLs and functional mobility. He requires min A for distal LE dressing but reports his wife will assist at home. Reviewed spinal precautions in the context of daily routine and ADL transfers. No further OT needs at this time, OT will sign off.     Follow Up Recommendations  No OT follow up    Equipment Recommendations  None recommended by OT       Precautions / Restrictions Precautions Precautions: Back Precaution Comments: verbally reviewed with pt  Required Braces or Orthoses: Spinal Brace Spinal Brace: Thoracolumbosacral orthotic;Applied in sitting position Restrictions Weight Bearing Restrictions: No      Mobility Bed Mobility               General bed mobility comments: OOB upon entry  Transfers Overall transfer level: Needs assistance Equipment used: None Transfers: Sit to/from Stand Sit to Stand: Supervision         General transfer comment: cues for hand placement    Balance Overall balance assessment: Mild deficits observed, not formally tested                            ADL either performed or assessed with clinical judgement   ADL Overall ADL's : Needs assistance/impaired Eating/Feeding: Independent   Grooming: Independent;Applying deodorant;Oral care;Wash/dry face;Wash/dry hands;Standing   Upper  Body Bathing: Modified independent;Sitting   Lower Body Bathing: Minimal assistance;Sit to/from stand   Upper Body Dressing : Modified independent;Sitting   Lower Body Dressing: Minimal assistance;Sit to/from stand Lower Body Dressing Details (indicate cue type and reason): min A for reaching distally to feet. Unable to perform figure 4, reports wife can assist at home Toilet Transfer: Modified Independent;Ambulation;Comfort height toilet   Toileting- Clothing Manipulation and Hygiene: Modified independent;Sit to/from stand;Adhering to back precautions   Tub/ Shower Transfer: Walk-in shower;Shower seat;Supervision/safety   Functional mobility during ADLs: Modified independent       Vision Baseline Vision/History: No visual deficits Patient Visual Report: No change from baseline Vision Assessment?: No apparent visual deficits            Pertinent Vitals/Pain Pain Assessment: Faces Faces Pain Scale: Hurts little more Pain Location: back incision Pain Descriptors / Indicators: Discomfort Pain Intervention(s): Monitored during session     Hand Dominance Right   Extremity/Trunk Assessment Upper Extremity Assessment Upper Extremity Assessment: Overall WFL for tasks assessed   Lower Extremity Assessment Lower Extremity Assessment: Defer to PT evaluation   Cervical / Trunk Assessment Cervical / Trunk Assessment: Other exceptions Cervical / Trunk Exceptions: spinal sx   Communication Communication Communication: No difficulties   Cognition Arousal/Alertness: Awake/alert Behavior During Therapy: WFL for tasks assessed/performed Overall Cognitive Status: Within Functional Limits for tasks assessed  Home Living Family/patient expects to be discharged to:: Private residence Living Arrangements: Spouse/significant other Available Help at Discharge: Family;Available 24 hours/day Type of Home: House Home  Access: Stairs to enter [employer] of Steps: 1 Entrance Stairs-Rails: Can reach both Home Layout: One level     Bathroom Shower/Tub: Walk-in shower;Tub/shower unit   Bathroom Toilet: Handicapped height Bathroom Accessibility: Yes How Accessible: Accessible via walker Home Equipment: Bowers - 2 wheels;Bedside commode;Shower seat          Prior Functioning/Environment Level of Independence: Independent                 OT Problem List: Pain      OT Treatment/Interventions:      OT Goals(Current goals can be found in the care plan section) Acute Rehab OT Goals Patient Stated Goal: get back home OT Goal Formulation: All assessment and education complete, DC therapy  OT Frequency:      AM-PAC PT "6 Clicks" Daily Activity     Outcome Measure Help from another person eating meals?: None Help from another person taking care of personal grooming?: None Help from another person toileting, which includes using toliet, bedpan, or urinal?: None Help from another person bathing (including washing, rinsing, drying)?: None Help from another person to put on and taking off regular upper body clothing?: None Help from another person to put on and taking off regular lower body clothing?: A Little 6 Click Score: 23   End of Session Equipment Utilized During Treatment: Back brace Nurse Communication: Mobility status  Activity Tolerance: Patient tolerated treatment well Patient left: in chair;with call bell/phone within reach  OT Visit Diagnosis: Pain;Unsteadiness on feet (R26.81) Pain - part of body: (back)                Time: 2355-7322 OT Time Calculation (min): 14 min Charges:  OT General Charges $OT Visit: 1 Visit OT Evaluation $OT Eval Low Complexity: 1 Low  DIMA FERRUFINO OTR/L 09/07/2017, 11:11 AM

## 2017-09-07 NOTE — Plan of Care (Signed)
  Problem: Safety: Goal: Ability to remain free from injury will improve 09/07/2017 1106 by Lawanda Cousins, RN Outcome: Completed/Met 09/07/2017 0851 by Lawanda Cousins, RN Outcome: Progressing   Problem: Education: Goal: Ability to verbalize activity precautions or restrictions will improve 09/07/2017 1106 by Lawanda Cousins, RN Outcome: Completed/Met 09/07/2017 0851 by Lawanda Cousins, RN Outcome: Progressing Goal: Knowledge of the prescribed therapeutic regimen will improve 09/07/2017 1106 by Lawanda Cousins, RN Outcome: Completed/Met 09/07/2017 0851 by Lawanda Cousins, RN Outcome: Progressing Goal: Understanding of discharge needs will improve 09/07/2017 1106 by Lawanda Cousins, RN Outcome: Completed/Met 09/07/2017 0851 by Lawanda Cousins, RN Outcome: Progressing   Problem: Activity: Goal: Ability to avoid complications of mobility impairment will improve 09/07/2017 1106 by Lawanda Cousins, RN Outcome: Completed/Met 09/07/2017 0851 by Lawanda Cousins, RN Outcome: Progressing Goal: Ability to tolerate increased activity will improve 09/07/2017 1106 by Lawanda Cousins, RN Outcome: Completed/Met 09/07/2017 0851 by Lawanda Cousins, RN Outcome: Progressing Goal: Will remain free from falls 09/07/2017 1106 by Lawanda Cousins, RN Outcome: Completed/Met 09/07/2017 0851 by Lawanda Cousins, RN Outcome: Progressing   Problem: Bowel/Gastric: Goal: Gastrointestinal status for postoperative course will improve 09/07/2017 1106 by Lawanda Cousins, RN Outcome: Completed/Met 09/07/2017 0851 by Lawanda Cousins, RN Outcome: Progressing   Problem: Clinical Measurements: Goal: Ability to maintain clinical measurements within normal limits will improve 09/07/2017 1106 by Lawanda Cousins, RN Outcome: Completed/Met 09/07/2017 0851 by Lawanda Cousins, RN Outcome: Progressing Goal: Postoperative complications will be avoided or minimized 09/07/2017 1106 by Lawanda Cousins, RN Outcome: Completed/Met 09/07/2017 0851 by  Lawanda Cousins, RN Outcome: Progressing Goal: Diagnostic test results will improve 09/07/2017 1106 by Lawanda Cousins, RN Outcome: Completed/Met 09/07/2017 0851 by Lawanda Cousins, RN Outcome: Progressing   Problem: Pain Management: Goal: Pain level will decrease 09/07/2017 1106 by Lawanda Cousins, RN Outcome: Completed/Met 09/07/2017 0851 by Lawanda Cousins, RN Outcome: Progressing   Problem: Skin Integrity: Goal: Will show signs of wound healing 09/07/2017 1106 by Lawanda Cousins, RN Outcome: Completed/Met 09/07/2017 0851 by Lawanda Cousins, RN Outcome: Progressing   Problem: Health Behavior/Discharge Planning: Goal: Identification of resources available to assist in meeting health care needs will improve 09/07/2017 1106 by Lawanda Cousins, RN Outcome: Completed/Met 09/07/2017 0851 by Lawanda Cousins, RN Outcome: Progressing   Problem: Bladder/Genitourinary: Goal: Urinary functional status for postoperative course will improve 09/07/2017 1106 by Lawanda Cousins, RN Outcome: Completed/Met 09/07/2017 0851 by Lawanda Cousins, RN Outcome: Progressing

## 2017-09-12 MED FILL — Sodium Chloride IV Soln 0.9%: INTRAVENOUS | Qty: 1000 | Status: AC

## 2017-09-12 MED FILL — Thrombin (Recombinant) For Soln 20000 Unit: CUTANEOUS | Qty: 1 | Status: AC

## 2017-09-12 MED FILL — Heparin Sodium (Porcine) Inj 1000 Unit/ML: INTRAMUSCULAR | Qty: 30 | Status: AC

## 2017-09-19 DIAGNOSIS — Z9889 Other specified postprocedural states: Secondary | ICD-10-CM | POA: Diagnosis not present

## 2017-09-20 NOTE — Discharge Summary (Signed)
Patient ID: Walter White. MRN: [id number] DOB/AGE: [birth year] 67 y.o.  Admit date: 09/05/2017 Discharge date: 09/07/2017  Admission Diagnoses:  Active Problems:   Radiculopathy   Discharge Diagnoses:  Same  Past Medical History:  Diagnosis Date  . AAA (abdominal aortic aneurysm) (HCC)    4.0 cm AAA 08/22/17 U/S  . Allergy    Allergic Rhinitis  . Anticipated difficulty with intubation   . Arthritis    spinal steniosis  . At risk for difficult intubation    no trouble intubating as far as pt knows- swelling afterward -above note previous  . Benign prostate hyperplasia    with lower urinary tract symptoms  . Carpal tunnel syndrome on right   . Cataracts, both eyes   . Chronic back pain    caused by spinal stenosis  . 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 Elevated PSA   . GERD (gastroesophageal reflux disease)   . Hyperlipidemia   . Hypertension    Essential.    . Microhematuria   . OA (osteoarthritis) of knee   . Pneumonia    Hx -when very young  . Pre-diabetes   . Seasonal allergies   . Spinal stenosis    of lumbosacral region.  Causes chronic back pain.  [name] Upper abdominal pain     Surgeries: Procedure(s): LUMBAR 5 - SACRUM 1 POSTERIOR SPINAL FUSION WITH INSTRUMENTATION AND ALLOGRAFT on 09/06/2017   Consultants: Dr Early abdominal exposure  Discharged Condition: Improved  Hospital Course: Walter White. is an 67 y.o. male who was admitted 09/05/2017 for operative treatment of facet hypertrophy. Patient has severe unremitting pain that affects sleep, daily activities, and work/hobbies. After pre-op clearance the patient was taken to the operating room on 09/06/2017 and underwent  Procedure(s): LUMBAR 5 - SACRUM 1 POSTERIOR SPINAL FUSION WITH INSTRUMENTATION AND ALLOGRAFT.    Patient was given perioperative antibiotics:  Anti-infectives (From admission,  onward)   Start     Dose/Rate Route Frequency Ordered Stop   09/06/17 0800  vancomycin (VANCOCIN) 1,500 mg in sodium chloride 0.9 % 500 mL IVPB     1,500 mg 250 mL/hr over 120 Minutes Intravenous To ShortStay Surgical 09/06/17 0751 09/06/17 0805   09/06/17 0600  vancomycin (VANCOCIN) 1,500 mg in sodium chloride 0.9 % 500 mL IVPB  Status:  Discontinued     1,500 mg 250 mL/hr over 120 Minutes Intravenous To ShortStay Surgical 09/04/17 1043 09/05/17 1747   09/05/17 1930  vancomycin (VANCOCIN) IVPB 1000 mg/200 mL premix     1,000 mg 200 mL/hr over 60 Minutes Intravenous  Once 09/05/17 1859 09/05/17 2215   09/05/17 0600  vancomycin (VANCOCIN) 1,500 mg in sodium chloride 0.9 % 500 mL IVPB     1,500 mg 250 mL/hr over 120 Minutes Intravenous To ShortStay Surgical 09/04/17 1043 09/05/17 0820       Patient was given sequential compression devices, early ambulation to prevent DVT.  Patient benefited maximally from hospital stay and there were no complications.    Recent vital signs: BP (!) 74/54 (BP Location: Right Arm)   Pulse 94   Temp 98.2 F (36.8 C) (Oral)   Resp 16   Ht 5' 7.5" (1.715 m)   Wt 106.1 kg   SpO2 98%   BMI 36.11 kg/m    Discharge Medications:   Allergies as of 09/07/2017  Reactions   Pork-derived Products Shortness Of Breath   Shellfish Allergy Shortness Of Breath   Lovastatin Other (See Comments)   Bone pain    Lactose Intolerance (gi) Other (See Comments)   Dairy--allergic per allergy test   Penicillins    UNSPECIFIED CHILDHOOD REACTION  Has patient had a PCN reaction causing immediate rash, facial/tongue/throat swelling, SOB or lightheadedness with hypotension: Unknown Has patient had a PCN reaction causing severe rash involving mucus membranes or skin necrosis: Unknown Has patient had a PCN reaction that required hospitalization: Unknown Has patient had a PCN reaction occurring within the last 10 years: No If all of the above answers are "NO", then may  proceed with Cephalosporin use.      Medication List    TAKE these medications   docusate sodium 100 MG capsule Commonly known as:  COLACE Take 100 mg by mouth daily.   fluticasone 50 MCG/ACT nasal spray Commonly known as:  FLONASE Place 1 spray into both nostrils daily as needed for allergies.   JUICE PLUS FIBRE PO Take 6 capsules by mouth daily. Juice Plus+ Orchard Blend 2 capsules in the morning Juice Plus+ Garden Blend 2 capsules in the morning Juice Plus+ Vineyard Blend 2 capsule in the morning   lisinopril-hydrochlorothiazide 20-25 MG tablet Commonly known as:  PRINZIDE,ZESTORETIC Take 1 tablet by mouth daily.   pantoprazole 40 MG tablet Commonly known as:  PROTONIX Take 40 mg by mouth daily.   rosuvastatin 5 MG tablet Commonly known as:  CRESTOR Take 5 mg by mouth daily.   tamsulosin 0.4 MG Caps capsule Commonly known as:  FLOMAX Take 0.4 mg by mouth at bedtime.   VISINE OP Place 1 drop into both eyes daily as needed (irritation).       Diagnostic Studies: Dg Chest 2 View  Result Date: 08/28/2017 CLINICAL DATA:  Preoperative examination prior to lumbar spine surgery. History of hypertension, former smoker. EXAM: CHEST - 2 VIEW COMPARISON:  PA and lateral chest x-ray of January 19, 2016 FINDINGS: The lungs are adequately inflated. The interstitial markings are coarse though stable. There is no alveolar infiltrate or pleural effusion. The heart and pulmonary vascularity are normal. There is faint calcification in the wall of the aortic arch. The bony thorax exhibits no acute abnormality. IMPRESSION: Mild chronic bronchitic-smoking related changes, stable. No acute cardiopulmonary abnormality. Thoracic aortic atherosclerosis. Electronically Signed   By: David  Martinique M.D.   On: 08/28/2017 10:41   Dg Lumbar Spine 2-3 Views  Result Date: 09/06/2017 CLINICAL DATA:  L5-S1 posterior lumbar fusion EXAM: LUMBAR SPINE - 2-3 VIEW COMPARISON:  Lumbar spine films 09/06/2017  FINDINGS: The posterior hardware for fusion has been extended to now involve the L5-S1 as well. IMPRESSION: Posterior fusion now extends into the L5-S1 as well. Electronically Signed   By: Ivar Drape M.D.   On: 09/06/2017 14:17   Ct Lumbar Spine Wo Contrast  Result Date: 09/05/2017 CLINICAL DATA:  Preop planning for stage II of lumbar fusion. EXAM: CT LUMBAR SPINE AND PELVIS WITHOUT CONTRAST TECHNIQUE: Multidetector CT imaging of the lumbar spine and pelvis was performed without intravenous contrast administration. Multiplanar CT image reconstructions were also generated. COMPARISON:  None. FINDINGS: Lumbar spine: Segmentation: 5 lumbar type vertebral bodies Alignment: Slight retrolisthesis at L2-3. Fused anterolisthesis at L4-5. Vertebrae: Recent L5-S1 a left. The intervertebral cages in good position, as is the S1 anchor. Prior L3-4 and L4-5 fusion with solid arthrodesis. Negative for fracture. No incidental bone lesion or endplate erosion. And L1  hemangioma is noted in the body. Paraspinal and other soft tissues: Expected postoperative gas from anterior approach from the left. There is notable marked hypertrophy of the prostate, up lifting the bladder. A Foley is in good position. Atherosclerotic calcification. Right renal cystic density. Bilateral renal calculi measuring up to 7 mm on the right. Fatty umbilical hernia is present. Disc levels: T12- L1: Spondylosis.  No evident impingement L1-L2: Spondylosis.  Mild disc bulging.  No evident impingement L2-L3: Spondylosis and disc narrowing with bulge. Posterior element hypertrophy and mild retrolisthesis. At least moderate spinal stenosis. Noncompressive left more than right foraminal narrowing. L3-L4: Solid arthrodesis.  No visible impingement L4-L5: Solid arthrodesis.  No visible impingement L5-S1:Alif without visible impingement. Pelvis: Mild spurring at the bilateral sacroiliac joints without offset or erosion. There is osteitis pubis with  chondrocalcinosis and right subchondral cysts. Both hips are located and intact. Minor marginal hip spurring. Soft tissue findings noted above. No evidence of major musculoligamentous deficiency. IMPRESSION: 1. Treatment planning CT shows no acute finding. 2. L5-S1 ALIF earlier today without evident complication. 3. L3-4 and L4-5 solid arthrodesis. 4. L2-3 disc and facet degeneration with at least moderate spinal stenosis. 5. Incidental findings noted above. Electronically Signed   By: Monte Fantasia M.D.   On: 09/05/2017 15:02   Ct Pelvis Wo Contrast  Result Date: 09/05/2017 CLINICAL DATA:  Preop planning for stage II of lumbar fusion. EXAM: CT LUMBAR SPINE AND PELVIS WITHOUT CONTRAST TECHNIQUE: Multidetector CT imaging of the lumbar spine and pelvis was performed without intravenous contrast administration. Multiplanar CT image reconstructions were also generated. COMPARISON:  None. FINDINGS: Lumbar spine: Segmentation: 5 lumbar type vertebral bodies Alignment: Slight retrolisthesis at L2-3. Fused anterolisthesis at L4-5. Vertebrae: Recent L5-S1 a left. The intervertebral cages in good position, as is the S1 anchor. Prior L3-4 and L4-5 fusion with solid arthrodesis. Negative for fracture. No incidental bone lesion or endplate erosion. And L1 hemangioma is noted in the body. Paraspinal and other soft tissues: Expected postoperative gas from anterior approach from the left. There is notable marked hypertrophy of the prostate, up lifting the bladder. A Foley is in good position. Atherosclerotic calcification. Right renal cystic density. Bilateral renal calculi measuring up to 7 mm on the right. Fatty umbilical hernia is present. Disc levels: T12- L1: Spondylosis.  No evident impingement L1-L2: Spondylosis.  Mild disc bulging.  No evident impingement L2-L3: Spondylosis and disc narrowing with bulge. Posterior element hypertrophy and mild retrolisthesis. At least moderate spinal stenosis. Noncompressive left more  than right foraminal narrowing. L3-L4: Solid arthrodesis.  No visible impingement L4-L5: Solid arthrodesis.  No visible impingement L5-S1:Alif without visible impingement. Pelvis: Mild spurring at the bilateral sacroiliac joints without offset or erosion. There is osteitis pubis with chondrocalcinosis and right subchondral cysts. Both hips are located and intact. Minor marginal hip spurring. Soft tissue findings noted above. No evidence of major musculoligamentous deficiency. IMPRESSION: 1. Treatment planning CT shows no acute finding. 2. L5-S1 ALIF earlier today without evident complication. 3. L3-4 and L4-5 solid arthrodesis. 4. L2-3 disc and facet degeneration with at least moderate spinal stenosis. 5. Incidental findings noted above. Electronically Signed   By: Monte Fantasia M.D.   On: 09/05/2017 15:02   Dg Lumbar Spine 1 View  Result Date: 09/06/2017 CLINICAL DATA:  Localizer for lumbar surgery. EXAM: LUMBAR SPINE - 1 VIEW COMPARISON:  09/05/2017 CT FINDINGS: A single intraoperative LATERAL view of the lumbar spine is submitted postoperatively for interpretation. Two posterior metallic probes are identified, the 1st  directed towards the INFERIOR aspect of L5 and a 2nd directed towards the UPPER sacrum. Fusion hardware from L3-S1 noted. IMPRESSION: Cytogeneticist Signed   By: Margarette Canada M.D.   On: 09/06/2017 14:08   Dg Lumbar Spine 1 View  Result Date: 09/05/2017 CLINICAL DATA:  L5-S1 anterior lumbar interbody fusion with allograft EXAM: DG C-ARM 61-120 MIN; LUMBAR SPINE - 1 VIEW FLUOROSCOPY TIME:  31 seconds COMPARISON:  Lumbar spine radiographs-01/27/2016 FINDINGS: Single spot intraoperative lateral projection fluoroscopic image of the lower lumbar spine is provided for review. Spinal labeling is in keeping with prior lumbar spine radiographs. The caudal aspect of known L4-L5 paraspinal fusion is incompletely imaged. Surgical support apparatus is seen anterior to the L5-S1 intervertebral  disc space with apparent placement of a vertebral disc cage device at the L5-S1 disc level. IMPRESSION: Postoperative change of L5-S1 as above. Electronically Signed   By: Sandi Mariscal M.D.   On: 09/05/2017 13:12   Dg C-arm 1-60 Min  Result Date: 09/06/2017 CLINICAL DATA:  Posterior fusion at L5-S1 EXAM: DG C-ARM 61-120 MIN COMPARISON:  Lumbar spine films of 09/05/2016 FINDINGS: C-arm fluoroscopy was provided during posterior fusion at L5-S1. Fluoroscopy time of 3 minutes 34 seconds was recorded. IMPRESSION: C-arm fluoroscopy provided. Electronically Signed   By: Ivar Drape M.D.   On: 09/06/2017 14:18   Dg C-arm 1-60 Min  Result Date: 09/06/2017 CLINICAL DATA:  Posterior fusion L5-S1 EXAM: DG C-ARM 61-120 MIN COMPARISON:  Lumbar spine films of 09/05/2016 FINDINGS: C-arm fluoroscopy was provided for posterior fusion at L5-S1. Fluoroscopy time of 3 minutes 34 seconds was recorded. IMPRESSION: C-arm fluoroscopy provided. Electronically Signed   By: Ivar Drape M.D.   On: 09/06/2017 14:18   Dg C-arm 1-60 Min  Result Date: 09/06/2017 CLINICAL DATA:  L5-S1 posterior laminectomy and interbody fusion EXAM: DG C-ARM 61-120 MIN COMPARISON:  Lumbar spine images 8 09/28/2016 and 09/05/2016 FINDINGS: The posterior hardware for fusion has been extended to include the L5-S1 level. Anterior fusion at L5 remains. IMPRESSION: C-arm fluoroscopy was provided. Fluoroscopy time 3 minutes 34 seconds was recorded. Electronically Signed   By: Ivar Drape M.D.   On: 09/06/2017 14:17   Dg C-arm 1-60 Min  Result Date: 09/05/2017 CLINICAL DATA:  L5-S1 anterior lumbar interbody fusion with allograft EXAM: DG C-ARM 61-120 MIN; LUMBAR SPINE - 1 VIEW FLUOROSCOPY TIME:  31 seconds COMPARISON:  Lumbar spine radiographs-01/27/2016 FINDINGS: Single spot intraoperative lateral projection fluoroscopic image of the lower lumbar spine is provided for review. Spinal labeling is in keeping with prior lumbar spine radiographs. The caudal  aspect of known L4-L5 paraspinal fusion is incompletely imaged. Surgical support apparatus is seen anterior to the L5-S1 intervertebral disc space with apparent placement of a vertebral disc cage device at the L5-S1 disc level. IMPRESSION: Postoperative change of L5-S1 as above. Electronically Signed   By: Sandi Mariscal M.D.   On: 09/05/2017 13:12   Dg C-arm 1-60 Min  Result Date: 09/05/2017 CLINICAL DATA:  L5-S1 anterior lumbar interbody fusion with allograft EXAM: DG C-ARM 61-120 MIN; LUMBAR SPINE - 1 VIEW FLUOROSCOPY TIME:  31 seconds COMPARISON:  Lumbar spine radiographs-01/27/2016 FINDINGS: Single spot intraoperative lateral projection fluoroscopic image of the lower lumbar spine is provided for review. Spinal labeling is in keeping with prior lumbar spine radiographs. The caudal aspect of known L4-L5 paraspinal fusion is incompletely imaged. Surgical support apparatus is seen anterior to the L5-S1 intervertebral disc space with apparent placement of a vertebral disc cage device at the  L5-S1 disc level. IMPRESSION: Postoperative change of L5-S1 as above. Electronically Signed   By: Sandi Mariscal M.D.   On: 09/05/2017 13:12   Dg Or Local Abdomen  Result Date: 09/05/2017 CLINICAL DATA:  Possible retained foreign body following lumbar fusion EXAM: OR LOCAL ABDOMEN COMPARISON:  None. FINDINGS: Changes consistent with prior fusion are noted at L3-4 and L4-5. New changes are noted at S1 and L5-S1. No radiopaque foreign body is noted. No acute abnormality is seen. IMPRESSION: No evidence of retained radiopaque foreign body. These results were called by telephone at the time of interpretation on 09/05/2017 at 11:16 am to [place], the circulator in the operating room, who verbally acknowledged these results. Electronically Signed   By: Inez Catalina M.D.   On: 09/05/2017 11:17    Disposition: Discharge disposition: 01-Home or Self Care       Discharge Instructions    Discharge patient   Complete by:  As  directed    Discharge disposition:  01-Home or Self Care   Discharge patient date:  09/07/2017     POD #1 & 2 s/p ANT/POST staged lumbar fusion doing well  - up with PT/OT, encourage ambulation - Percocet for pain, Valium for muscle spasms -Written scripts for pain signed and in chart -D/C instructions sheet printed and in chart -D/C today  -F/U in office 2 weeks   Signed: Justice Britain 09/20/2017, 7:43 AM

## 2017-09-21 ENCOUNTER — Ambulatory Visit: Payer: Medicare Other | Admitting: Vascular Surgery

## 2017-09-21 ENCOUNTER — Other Ambulatory Visit (HOSPITAL_COMMUNITY): Payer: Medicare Other

## 2017-09-27 DIAGNOSIS — H8110 Benign paroxysmal vertigo, unspecified ear: Secondary | ICD-10-CM | POA: Diagnosis not present

## 2017-10-15 DIAGNOSIS — M545 Low back pain: Secondary | ICD-10-CM | POA: Diagnosis not present

## 2017-11-16 DIAGNOSIS — M545 Low back pain: Secondary | ICD-10-CM | POA: Diagnosis not present

## 2017-11-27 DIAGNOSIS — M4326 Fusion of spine, lumbar region: Secondary | ICD-10-CM | POA: Diagnosis not present

## 2017-11-29 DIAGNOSIS — M4326 Fusion of spine, lumbar region: Secondary | ICD-10-CM | POA: Diagnosis not present

## 2017-12-04 DIAGNOSIS — M4326 Fusion of spine, lumbar region: Secondary | ICD-10-CM | POA: Diagnosis not present

## 2017-12-04 DIAGNOSIS — M79672 Pain in left foot: Secondary | ICD-10-CM | POA: Diagnosis not present

## 2017-12-04 DIAGNOSIS — M1711 Unilateral primary osteoarthritis, right knee: Secondary | ICD-10-CM | POA: Diagnosis not present

## 2017-12-10 DIAGNOSIS — M4326 Fusion of spine, lumbar region: Secondary | ICD-10-CM | POA: Diagnosis not present

## 2017-12-12 DIAGNOSIS — M4326 Fusion of spine, lumbar region: Secondary | ICD-10-CM | POA: Diagnosis not present

## 2017-12-17 DIAGNOSIS — M4326 Fusion of spine, lumbar region: Secondary | ICD-10-CM | POA: Diagnosis not present

## 2017-12-19 DIAGNOSIS — M4326 Fusion of spine, lumbar region: Secondary | ICD-10-CM | POA: Diagnosis not present

## 2017-12-24 DIAGNOSIS — M4326 Fusion of spine, lumbar region: Secondary | ICD-10-CM | POA: Diagnosis not present

## 2017-12-25 DIAGNOSIS — H04123 Dry eye syndrome of bilateral lacrimal glands: Secondary | ICD-10-CM | POA: Diagnosis not present

## 2017-12-25 DIAGNOSIS — H25811 Combined forms of age-related cataract, right eye: Secondary | ICD-10-CM | POA: Diagnosis not present

## 2017-12-25 DIAGNOSIS — H25812 Combined forms of age-related cataract, left eye: Secondary | ICD-10-CM | POA: Diagnosis not present

## 2017-12-25 DIAGNOSIS — H43822 Vitreomacular adhesion, left eye: Secondary | ICD-10-CM | POA: Diagnosis not present

## 2017-12-28 DIAGNOSIS — M545 Low back pain: Secondary | ICD-10-CM | POA: Diagnosis not present

## 2017-12-31 ENCOUNTER — Other Ambulatory Visit: Payer: Self-pay | Admitting: Orthopedic Surgery

## 2017-12-31 DIAGNOSIS — M545 Low back pain, unspecified: Secondary | ICD-10-CM

## 2018-01-15 ENCOUNTER — Ambulatory Visit
Admission: RE | Admit: 2018-01-15 | Discharge: 2018-01-15 | Disposition: A | Discharge status: Home / Self Care | Payer: Medicare Other | Source: Ambulatory Visit | Attending: Orthopedic Surgery | Admitting: Orthopedic Surgery

## 2018-01-15 DIAGNOSIS — M545 Low back pain, unspecified: Secondary | ICD-10-CM

## 2018-01-15 DIAGNOSIS — M48061 Spinal stenosis, lumbar region without neurogenic claudication: Secondary | ICD-10-CM | POA: Diagnosis not present

## 2018-01-18 DIAGNOSIS — M4696 Unspecified inflammatory spondylopathy, lumbar region: Secondary | ICD-10-CM | POA: Diagnosis not present

## 2018-01-21 DIAGNOSIS — M4326 Fusion of spine, lumbar region: Secondary | ICD-10-CM | POA: Diagnosis not present

## 2018-01-23 DIAGNOSIS — H2512 Age-related nuclear cataract, left eye: Secondary | ICD-10-CM | POA: Diagnosis not present

## 2018-01-23 DIAGNOSIS — M4326 Fusion of spine, lumbar region: Secondary | ICD-10-CM | POA: Diagnosis not present

## 2018-01-23 DIAGNOSIS — H25812 Combined forms of age-related cataract, left eye: Secondary | ICD-10-CM | POA: Diagnosis not present

## 2018-01-25 DIAGNOSIS — Z6835 Body mass index (BMI) 35.0-35.9, adult: Secondary | ICD-10-CM | POA: Diagnosis not present

## 2018-01-25 DIAGNOSIS — M47816 Spondylosis without myelopathy or radiculopathy, lumbar region: Secondary | ICD-10-CM | POA: Diagnosis not present

## 2018-01-29 DIAGNOSIS — M4326 Fusion of spine, lumbar region: Secondary | ICD-10-CM | POA: Diagnosis not present

## 2018-01-31 DIAGNOSIS — M4326 Fusion of spine, lumbar region: Secondary | ICD-10-CM | POA: Diagnosis not present

## 2018-02-04 DIAGNOSIS — H2511 Age-related nuclear cataract, right eye: Secondary | ICD-10-CM | POA: Diagnosis not present

## 2018-02-04 DIAGNOSIS — M4326 Fusion of spine, lumbar region: Secondary | ICD-10-CM | POA: Diagnosis not present

## 2018-02-05 DIAGNOSIS — M1711 Unilateral primary osteoarthritis, right knee: Secondary | ICD-10-CM | POA: Diagnosis not present

## 2018-02-06 DIAGNOSIS — H2511 Age-related nuclear cataract, right eye: Secondary | ICD-10-CM | POA: Diagnosis not present

## 2018-02-06 DIAGNOSIS — H25811 Combined forms of age-related cataract, right eye: Secondary | ICD-10-CM | POA: Diagnosis not present

## 2018-02-12 DIAGNOSIS — M1711 Unilateral primary osteoarthritis, right knee: Secondary | ICD-10-CM | POA: Diagnosis not present

## 2018-02-12 DIAGNOSIS — M4326 Fusion of spine, lumbar region: Secondary | ICD-10-CM | POA: Diagnosis not present

## 2018-02-18 DIAGNOSIS — M4326 Fusion of spine, lumbar region: Secondary | ICD-10-CM | POA: Diagnosis not present

## 2018-02-19 DIAGNOSIS — M1711 Unilateral primary osteoarthritis, right knee: Secondary | ICD-10-CM | POA: Diagnosis not present

## 2018-02-19 DIAGNOSIS — M47816 Spondylosis without myelopathy or radiculopathy, lumbar region: Secondary | ICD-10-CM | POA: Diagnosis not present

## 2018-02-27 ENCOUNTER — Encounter: Payer: Self-pay | Admitting: Internal Medicine

## 2018-03-05 DIAGNOSIS — R779 Abnormality of plasma protein, unspecified: Secondary | ICD-10-CM | POA: Diagnosis not present

## 2018-03-05 DIAGNOSIS — I10 Essential (primary) hypertension: Secondary | ICD-10-CM | POA: Diagnosis not present

## 2018-03-05 DIAGNOSIS — Z125 Encounter for screening for malignant neoplasm of prostate: Secondary | ICD-10-CM | POA: Diagnosis not present

## 2018-03-05 DIAGNOSIS — N390 Urinary tract infection, site not specified: Secondary | ICD-10-CM | POA: Diagnosis not present

## 2018-03-05 DIAGNOSIS — E785 Hyperlipidemia, unspecified: Secondary | ICD-10-CM | POA: Diagnosis not present

## 2018-03-05 DIAGNOSIS — D649 Anemia, unspecified: Secondary | ICD-10-CM | POA: Diagnosis not present

## 2018-03-05 DIAGNOSIS — E1122 Type 2 diabetes mellitus with diabetic chronic kidney disease: Secondary | ICD-10-CM | POA: Diagnosis not present

## 2018-03-11 DIAGNOSIS — M545 Low back pain: Secondary | ICD-10-CM | POA: Diagnosis not present

## 2018-03-12 DIAGNOSIS — E785 Hyperlipidemia, unspecified: Secondary | ICD-10-CM | POA: Diagnosis not present

## 2018-03-12 DIAGNOSIS — D649 Anemia, unspecified: Secondary | ICD-10-CM | POA: Diagnosis not present

## 2018-03-12 DIAGNOSIS — I10 Essential (primary) hypertension: Secondary | ICD-10-CM | POA: Diagnosis not present

## 2018-03-12 DIAGNOSIS — K219 Gastro-esophageal reflux disease without esophagitis: Secondary | ICD-10-CM | POA: Diagnosis not present

## 2018-03-12 DIAGNOSIS — N390 Urinary tract infection, site not specified: Secondary | ICD-10-CM | POA: Diagnosis not present

## 2018-03-12 DIAGNOSIS — R778 Other specified abnormalities of plasma proteins: Secondary | ICD-10-CM | POA: Diagnosis not present

## 2018-03-12 DIAGNOSIS — E1122 Type 2 diabetes mellitus with diabetic chronic kidney disease: Secondary | ICD-10-CM | POA: Diagnosis not present

## 2018-03-12 DIAGNOSIS — Z23 Encounter for immunization: Secondary | ICD-10-CM | POA: Diagnosis not present

## 2018-03-12 DIAGNOSIS — Z0000 Encounter for general adult medical examination without abnormal findings: Secondary | ICD-10-CM | POA: Diagnosis not present

## 2018-03-12 DIAGNOSIS — R972 Elevated prostate specific antigen [PSA]: Secondary | ICD-10-CM | POA: Diagnosis not present

## 2018-03-15 ENCOUNTER — Telehealth: Payer: Self-pay | Admitting: Oncology

## 2018-03-15 NOTE — Telephone Encounter (Signed)
Called and spoke with patient about appt on 03/10. Patient is given appt time and location.

## 2018-03-18 ENCOUNTER — Telehealth: Payer: Self-pay | Admitting: Oncology

## 2018-03-18 ENCOUNTER — Other Ambulatory Visit: Payer: Self-pay | Admitting: *Deleted

## 2018-03-18 DIAGNOSIS — R779 Abnormality of plasma protein, unspecified: Secondary | ICD-10-CM

## 2018-03-18 DIAGNOSIS — R778 Other specified abnormalities of plasma proteins: Secondary | ICD-10-CM

## 2018-03-18 NOTE — Telephone Encounter (Signed)
Patient stopped by to change time of 3/10 lab/fu. [place]

## 2018-03-18 NOTE — Progress Notes (Signed)
[place]  Telephone:(336) [phone] Fax:(336) [phone]    ID: Walter White. DOB: 09/22/1950  MR#: [id number]  KCM#:[id number]  Patient Care Team: Walter Pretty, MD as PCP - General (Internal Medicine) Walter Rhodes, MD as Consulting Physician (Urology) Walter Pear, MD as Consulting Physician (Orthopedic Surgery) Walter Sorrel, MD as Consulting Physician (Neurology) Walter Bob, MD as Consulting Physician (Orthopedic Surgery) , Walter Dad, MD as Consulting Physician (Hematology and Oncology) OTHER MD:   CHIEF COMPLAINT:   CURRENT TREATMENT:    HISTORY OF CURRENT ILLNESS: Walter White. is followed by Dr. Concha White, who noted a high total protein at 8.8 with an albumin of 4.0.  An SPEP was obtained on 03/05/2018, showing an M spike of 2.3.  Immunofixation was not performed.  The patient was also found to be mildly anemic, and to have a slightly low white cell count.  Given these findings he is being referred for evaluation of plasma cell dyscrasia, possibly myeloma.  The patient's subsequent history is as detailed below.   INTERVAL HISTORY: Walter White was evaluated in the hematology clinic on 03/19/2018.    REVIEW OF SYSTEMS: Walter White does not have a formal exercise routine; he has a stationary bike at home, but it hurts his knee to use. He is limited to the exercises he can do due to prior knee and back surgical pain. He is currently being treated for a prostate infection. The patient denies unusual headaches, visual changes, nausea, vomiting, stiff neck, dizziness, or gait imbalance. There has been no cough, phlegm production, or pleurisy, no chest pain or pressure, and no change in bowel or bladder habits. The patient denies fever, rash, bleeding, unexplained fatigue or unexplained weight loss. A detailed review of systems was otherwise entirely negative.   PAST MEDICAL HISTORY: Past Medical History:  Diagnosis Date  . AAA (abdominal aortic aneurysm)  (HCC)    4.0 cm AAA 08/22/17 U/S  . Allergy    Allergic Rhinitis  . Anticipated difficulty with intubation   . Arthritis    spinal steniosis  . At risk for difficult intubation    no trouble intubating as far as pt knows- swelling afterward -above note previous  . Benign prostate hyperplasia    with lower urinary tract symptoms  . Carpal tunnel syndrome on right   . Cataracts, both eyes   . Chronic back pain    caused by spinal stenosis  . 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 Elevated PSA   . GERD (gastroesophageal reflux disease)   . Hyperlipidemia   . Hypertension    Essential.    . Microhematuria   . OA (osteoarthritis) of knee   . Pneumonia    Hx -when very young  . Pre-diabetes   . Seasonal allergies   . Spinal stenosis    of lumbosacral region.  Causes chronic back pain.  [name] Upper abdominal pain      PAST SURGICAL HISTORY: Past Surgical History:  Procedure Laterality Date  . ABDOMINAL EXPOSURE N/A 09/05/2017   Procedure: ABDOMINAL EXPOSURE;  Surgeon: Walter Posner, MD;  Location: [facility] OR;  Service: Vascular;  Laterality: N/A;  . ANTERIOR LAT LUMBAR FUSION Left 01/26/2016   Procedure: LEFT SIDED LATERAL INTERBODY FUSION, LUMBAR 3-4 WITH INSTRUMENTATION AND ALLOGRAFT;  Surgeon: Walter Bob, MD;  Location: [place];  Service: Orthopedics;  Laterality: Left;  LEFT SIDED LATERAL  INTERBODY FUSION, LUMBAR 3-4 WITH INSTRUMENTATION AND ALLOGRAFT  . ANTERIOR LATERAL LUMBAR FUSION WITH PERCUTANEOUS SCREW 1 LEVEL N/A 09/05/2017   Procedure: LUMBAR 5 - SACRUM 1 ANTERIOR LUMBAR INTERBODY FUSION WITH INSTRUMENTATION AND ALLOGRAFT;  Surgeon: Walter Bob, MD;  Location: [place];  Service: Orthopedics;  Laterality: N/A;  . BACK SURGERY  01/2015   dr. Lynann White. [phone].  [name] CARPAL TUNNEL RELEASE     right   . COLONOSCOPY    . COLONOSCOPY  2010   polyp  . ELBOW SURGERY     right ulnar  release  . ELBOW SURGERY     LEFT CTR  . JOINT REPLACEMENT     left- Sept 2016  . KNEE ARTHROSCOPY  11/23/2010   Procedure: ARTHROSCOPY KNEE;  Surgeon: Walter White;  Location: [place];  Service: Orthopedics;  Laterality: Left;  arthroscopy left knee  . KNEE ARTHROSCOPY     left x 2  . Knee scar tissue removal Left 2018  . LATERAL FUSION LUMBAR SPINE  01./17/2018  . polypectomy  2010  . SHOULDER ARTHROSCOPY Left    x2  . TONSILLECTOMY    . TOTAL KNEE ARTHROPLASTY Left 09/21/2014   Procedure: TOTAL KNEE ARTHROPLASTY;  Surgeon: Walter Pear, MD;  Location: [place];  Service: Orthopedics;  Laterality: Left;  . ULNAR NERVE TRANSPOSITION Left 04/16/2017   Procedure: LEFT ULNAR NEUROPLASTY AT THE WRIST AND TRANSPOSITION AT THE ELBOW;  Surgeon: Walter Jakob, MD;  Location: [place];  Service: Orthopedics;  Laterality: Left;  . WISDOM TOOTH EXTRACTION       FAMILY HISTORY: Family History  Problem Relation Age of Onset  . Cancer Mother   . Liver disease Father   . Colon cancer Neg Hx   . Colon polyps Neg Hx   . Esophageal cancer Neg Hx   . Rectal cancer Neg Hx   . Stomach cancer Neg Hx    Walter White's father died from heart failure at age 66. Patients' mother died from lung cancer at age 94. The patient has 1 sister, who is deceased due to a motor vehicle accident. Patient denies anyone in her family having breast, ovarian, prostate, or pancreatic cancer.    SOCIAL HISTORY:  Walter White is retired from the [employer]. His wife, Walter White, is a retired Pharmacist, hospital. Walter White's son Walter White graduated with his PhD in History, but he currently works at [employer] in [place]. Walter White's other son Walter White, is a Counsellor for [employer] and coaches football at [employer]. Walter White has two grandchildren. He attends [employer].    ADVANCED DIRECTIVES: Walter White's wife, Walter White, is automatically his healthcare power of  attorney.     HEALTH MAINTENANCE: Social History   Tobacco Use  . Smoking status: Former Smoker    Last attempt to quit: 11/21/1980    Years since quitting: 37.3  . Smokeless tobacco: Never Used  Substance Use Topics  . Alcohol use: Yes    Alcohol/week: 0.0 standard drinks    Comment: Very rare  . Drug use: No    Colonoscopy: due 04/2018  Bone density:    Allergies  Allergen Reactions  . Pork-Derived Products Shortness Of Breath  . Shellfish Allergy Shortness Of Breath  . Lovastatin Other (See Comments)    Bone pain   . Lactose Intolerance (Gi) Other (See Comments)    Dairy--allergic per allergy test  . Penicillins     UNSPECIFIED CHILDHOOD REACTION  Has patient had  a PCN reaction causing immediate rash, facial/tongue/throat swelling, SOB or lightheadedness with hypotension: Unknown Has patient had a PCN reaction causing severe rash involving mucus membranes or skin necrosis: Unknown Has patient had a PCN reaction that required hospitalization: Unknown Has patient had a PCN reaction occurring within the last 10 years: No If all of the above answers are "NO", then may proceed with Cephalosporin use.     Current Outpatient Medications  Medication Sig Dispense Refill  . diclofenac (VOLTAREN) 75 MG EC tablet     . docusate sodium (COLACE) 100 MG capsule Take 100 mg by mouth daily.     [name] lisinopril-hydrochlorothiazide (PRINZIDE,ZESTORETIC) 20-25 MG tablet Take 1 tablet by mouth daily.    . Nutritional Supplements (JUICE PLUS FIBRE PO) Take 6 capsules by mouth daily. Juice Plus+ Orchard Blend 2 capsules in the morning Juice Plus+ Garden Blend 2 capsules in the morning Juice Plus+ Vineyard Blend 2 capsule in the morning    . pantoprazole (PROTONIX) 40 MG tablet Take 40 mg by mouth daily.    . rosuvastatin (CRESTOR) 5 MG tablet Take 5 mg by mouth daily.    . Tamsulosin HCl (FLOMAX) 0.4 MG CAPS Take 0.4 mg by mouth at bedtime.     . Tetrahydrozoline HCl (VISINE OP) Place 1  drop into both eyes daily as needed (irritation).    . traMADol (ULTRAM) 50 MG tablet     . HYDROcodone-acetaminophen (NORCO/VICODIN) 5-325 MG tablet      No current facility-administered medications for this visit.      OBJECTIVE: Middle-aged white man wearing an abdominal brace  Vitals:   03/19/18 1108  BP: 124/74  Pulse: (!) 110  Resp: 18  Temp: 99.3 F (37.4 C)  SpO2: 97%     Body mass index is 35.86 kg/m.   Wt Readings from Last 3 Encounters:  03/19/18 232 lb 6.4 oz (105.4 kg)  09/05/17 234 lb (106.1 kg)  09/03/17 234 lb (106.1 kg)      ECOG FS:1 - Symptomatic but completely ambulatory  Ocular: Sclerae unicteric, pupils round and equal Lymphatic: No cervical or supraclavicular adenopathy Lungs no rales or rhonchi Heart regular rate and rhythm Abd soft, nontender, positive bowel sounds MSK no focal spinal tenderness, no joint edema Neuro: non-focal, well-oriented, appropriate affect    LAB RESULTS:  CMP     Component Value Date/Time   NA 138 03/19/2018 1047   K 4.0 03/19/2018 1047   CL 105 03/19/2018 1047   CO2 26 03/19/2018 1047   GLUCOSE 167 (H) 03/19/2018 1047   BUN 25 (H) 03/19/2018 1047   CREATININE 1.01 03/19/2018 1047   CALCIUM 9.7 03/19/2018 1047   PROT 9.0 (H) 03/19/2018 1047   ALBUMIN 4.0 03/19/2018 1047   AST 30 03/19/2018 1047   ALT 26 03/19/2018 1047   ALKPHOS 85 03/19/2018 1047   BILITOT 0.5 03/19/2018 1047   GFRNONAA >60 03/19/2018 1047   GFRAA >60 03/19/2018 1047    No results found for: TOTALPROTELP, ALBUMINELP, A1GS, A2GS, BETS, BETA2SER, GAMS, MSPIKE, SPEI  No results found for: KPAFRELGTCHN, LAMBDASER, KAPLAMBRATIO  Lab Results  Component Value Date   WBC 3.2 (L) 03/19/2018   NEUTROABS 2.4 03/19/2018   HGB 11.6 (L) 03/19/2018   HCT 34.3 (L) 03/19/2018   MCV 92.0 03/19/2018   PLT 155 03/19/2018    _0 @  No results found for: LABCA2  No components found for: [id number]  No results for input(s): INR in the  last 168 hours.  No results  found for: LABCA2  No results found for: DUK025  No results found for: KYH062  No results found for: BJS283  No results found for: CA2729  No components found for: HGQUANT  No results found for: CEA1 / No results found for: CEA1   No results found for: AFPTUMOR  No results found for: [place]  No results found for: PSA1  Appointment on 03/19/2018  Component Date Value Ref Range Status  . WBC Count 03/19/2018 3.2* 4.0 - 10.5 K/uL Final  . RBC 03/19/2018 3.73* 4.22 - 5.81 MIL/uL Final  . Hemoglobin 03/19/2018 11.6* 13.0 - 17.0 g/dL Final  . HCT 03/19/2018 34.3* 39.0 - 52.0 % Final  . MCV 03/19/2018 92.0  80.0 - 100.0 fL Final  . MCH 03/19/2018 31.1  26.0 - 34.0 pg Final  . MCHC 03/19/2018 33.8  30.0 - 36.0 g/dL Final  . RDW 03/19/2018 13.5  11.5 - 15.5 % Final  . Platelet Count 03/19/2018 155  150 - 400 K/uL Final  . nRBC 03/19/2018 0.0  0.0 - 0.2 % Final  . Neutrophils Relative % 03/19/2018 74  % Final  . Neutro Abs 03/19/2018 2.4  1.7 - 7.7 K/uL Final  . Lymphocytes Relative 03/19/2018 16  % Final  . Lymphs Abs 03/19/2018 0.5* 0.7 - 4.0 K/uL Final  . Monocytes Relative 03/19/2018 8  % Final  . Monocytes Absolute 03/19/2018 0.2  0.1 - 1.0 K/uL Final  . Eosinophils Relative 03/19/2018 2  % Final  . Eosinophils Absolute 03/19/2018 0.1  0.0 - 0.5 K/uL Final  . Basophils Relative 03/19/2018 0  % Final  . Basophils Absolute 03/19/2018 0.0  0.0 - 0.1 K/uL Final  . Immature Granulocytes 03/19/2018 0  % Final  . Abs Immature Granulocytes 03/19/2018 0.00  0.00 - 0.07 K/uL Final   Performed at [facility], [place] [address] [place], [place] [address]  . Smear Review 03/19/2018 SMEAR STAINED AND AVAILABLE FOR REVIEW   Final   Performed at [facility], [address], [place], [place] [address]  . Sodium 03/19/2018 138  135 - 145 mmol/L Final  . Potassium 03/19/2018 4.0  3.5 - 5.1 mmol/L Final  .  Chloride 03/19/2018 105  98 - 111 mmol/L Final  . CO2 03/19/2018 26  22 - 32 mmol/L Final  . Glucose, Bld 03/19/2018 167* 70 - 99 mg/dL Final  . BUN 03/19/2018 25* 8 - 23 mg/dL Final  . Creatinine 03/19/2018 1.01  0.61 - 1.24 mg/dL Final  . Calcium 03/19/2018 9.7  8.9 - 10.3 mg/dL Final  . Total Protein 03/19/2018 9.0* 6.5 - 8.1 g/dL Final  . Albumin 03/19/2018 4.0  3.5 - 5.0 g/dL Final  . AST 03/19/2018 30  15 - 41 U/L Final  . ALT 03/19/2018 26  0 - 44 U/L Final  . Alkaline Phosphatase 03/19/2018 85  38 - 126 U/L Final  . Total Bilirubin 03/19/2018 0.5  0.3 - 1.2 mg/dL Final  . GFR, Est Non Af Am 03/19/2018 >60  >60 mL/min Final  . GFR, Est AFR Am 03/19/2018 >60  >60 mL/min Final  . Anion gap 03/19/2018 7  5 - 15 Final   Performed at [facility], [place] [address], [place], [place] [address]    (this displays the last labs from the last 3 days)  No results found for: TOTALPROTELP, ALBUMINELP, A1GS, A2GS, BETS, BETA2SER, GAMS, MSPIKE, SPEI (this displays SPEP labs)  No results found for:  KPAFRELGTCHN, LAMBDASER, KAPLAMBRATIO (kappa/lambda light chains)  No results found for: HGBA, HGBA2QUANT, HGBFQUANT, HGBSQUAN (Hemoglobinopathy evaluation)   No results found for: LDH  No results found for: IRON, TIBC, IRONPCTSAT (Iron and TIBC)  No results found for: FERRITIN  Urinalysis    Component Value Date/Time   COLORURINE YELLOW 08/28/2017 [place] 08/28/2017 0925   LABSPEC 1.017 08/28/2017 0925   PHURINE 5.0 08/28/2017 [place] 08/28/2017 [place] 08/28/2017 [place] 08/28/2017 0925   KETONESUR NEGATIVE 08/28/2017 0925   PROTEINUR NEGATIVE 08/28/2017 0925   UROBILINOGEN 0.2 09/10/2014 0852   NITRITE NEGATIVE 08/28/2017 0925   LEUKOCYTESUR NEGATIVE 08/28/2017 0925     STUDIES:  MRI of the lumbar spine and CT of the lumbar spine 01/15/2018 showed degenerative disease and postoperative  changes but no obvious lytic lesions.  CT of the pelvis and lumbar spine without contrast again shows no evidence of lytic lesions.  Hest x-ray 08/28/2017 shows no bony abnormalities.  ELIGIBLE FOR AVAILABLE RESEARCH PROTOCOL: no   ASSESSMENT: 68 y.o. [place], [place] man with a 2.3 g M spike on SPEP obtained 03/05/2018, with mild anemia and leukopenia.  PLAN: I spent approximately 60 minutes face to face with Gwyndolyn Saxon with more than 50% of that time spent in counseling and coordination of care. Specifically we reviewed the biology of the patient's diagnosis and the specifics of her situation.  We discussed the fact that all blood cells are made in the bone marrow and we discussed the difference between red cells, white cells, and platelets.  He understands his platelet count is normal.  His red cells, which are morphologically unremarkable, show mild anemia.  His white cells also are slightly decreased.  With 2 out of 3 blood cell lines affected, although very minimally, the possibility of a primary bone marrow problem and needs to be considered.  In addition he has an M spike of 2.3 g noted 2 weeks ago by Dr. Shelia Media.  This could be due to a plasma cell clone, but there are many possible causes of an M spike, MGUS and myeloma being only the most common.  We discussed the normal role of plasma cells, the way an SPEP detects a clone, and the fact that if he does have a clonal M spike due to an abnormal immunoglobulin he will need additional work-up including a bone survey and a bone marrow biopsy.  He understands that some plasma cell dyscrasias such as MGUS requires only follow-up, while others like myeloma may require active treatment.  I reassured him that even if he had myeloma the current survival is in the 5 to 10-year range and things are changing very rapidly in this malignancy.  At this point however we do not have a diagnosis.  The lab work was obtained today and I should have the results within  the next few days.  I will call him most likely next week with those results and if he needs additional tests we will set those up.  He will then see me again at the end of this month to discuss final plans  Walter White has a good understanding of the overall plan.  He agrees with it. He will call with any problems that may develop before his next visit here.   , Walter Dad, MD  03/19/18 12:11 PM Medical Oncology and Hematology [facility] [address] [place], [place] [address] Tel. [phone]  Fax. [phone]   I, Jacqualyn Posey am acting as a Education administrator for Chauncey Cruel, MD.   I, Lurline Del MD, have reviewed the above documentation for accuracy and completeness, and I agree with the above.

## 2018-03-19 ENCOUNTER — Telehealth: Payer: Self-pay | Admitting: Oncology

## 2018-03-19 ENCOUNTER — Inpatient Hospital Stay (HOSPITAL_BASED_OUTPATIENT_CLINIC_OR_DEPARTMENT_OTHER): Payer: Medicare Other | Admitting: Oncology

## 2018-03-19 ENCOUNTER — Inpatient Hospital Stay: Payer: Medicare Other | Attending: Oncology

## 2018-03-19 ENCOUNTER — Other Ambulatory Visit: Payer: Self-pay

## 2018-03-19 DIAGNOSIS — I10 Essential (primary) hypertension: Secondary | ICD-10-CM

## 2018-03-19 DIAGNOSIS — D649 Anemia, unspecified: Secondary | ICD-10-CM | POA: Insufficient documentation

## 2018-03-19 DIAGNOSIS — D472 Monoclonal gammopathy: Secondary | ICD-10-CM | POA: Insufficient documentation

## 2018-03-19 DIAGNOSIS — E785 Hyperlipidemia, unspecified: Secondary | ICD-10-CM | POA: Diagnosis not present

## 2018-03-19 DIAGNOSIS — Z79899 Other long term (current) drug therapy: Secondary | ICD-10-CM | POA: Insufficient documentation

## 2018-03-19 DIAGNOSIS — Z791 Long term (current) use of non-steroidal anti-inflammatories (NSAID): Secondary | ICD-10-CM

## 2018-03-19 DIAGNOSIS — R779 Abnormality of plasma protein, unspecified: Secondary | ICD-10-CM

## 2018-03-19 DIAGNOSIS — Z87891 Personal history of nicotine dependence: Secondary | ICD-10-CM | POA: Insufficient documentation

## 2018-03-19 DIAGNOSIS — E8809 Other disorders of plasma-protein metabolism, not elsewhere classified: Secondary | ICD-10-CM | POA: Diagnosis not present

## 2018-03-19 DIAGNOSIS — D72819 Decreased white blood cell count, unspecified: Secondary | ICD-10-CM | POA: Insufficient documentation

## 2018-03-19 DIAGNOSIS — R778 Other specified abnormalities of plasma proteins: Secondary | ICD-10-CM

## 2018-03-19 LAB — CMP (CANCER CENTER ONLY)
ALBUMIN: 4 g/dL (ref 3.5–5.0)
ALK PHOS: 85 U/L (ref 38–126)
ALT: 26 U/L (ref 0–44)
AST: 30 U/L (ref 15–41)
Anion gap: 7 (ref 5–15)
BILIRUBIN TOTAL: 0.5 mg/dL (ref 0.3–1.2)
BUN: 25 mg/dL — ABNORMAL HIGH (ref 8–23)
CO2: 26 mmol/L (ref 22–32)
Calcium: 9.7 mg/dL (ref 8.9–10.3)
Chloride: 105 mmol/L (ref 98–111)
Creatinine: 1.01 mg/dL (ref 0.61–1.24)
GFR, Est AFR Am: >60 mL/min (ref >60)
GFR, Estimated: >60 mL/min (ref >60)
GLUCOSE: 167 mg/dL — AB (ref 70–99)
Potassium: 4 mmol/L (ref 3.5–5.1)
SODIUM: 138 mmol/L (ref 135–145)
TOTAL PROTEIN: 9 g/dL — AB (ref 6.5–8.1)

## 2018-03-19 LAB — CBC WITH DIFFERENTIAL (CANCER CENTER ONLY)
Abs Immature Granulocytes: 0 10*3/uL (ref 0.00–0.07)
BASOS ABS: 0 10*3/uL (ref 0.0–0.1)
Basophils Relative: 0 %
Eosinophils Absolute: 0.1 10*3/uL (ref 0.0–0.5)
Eosinophils Relative: 2 %
HEMATOCRIT: 34.3 % — AB (ref 39.0–52.0)
HEMOGLOBIN: 11.6 g/dL — AB (ref 13.0–17.0)
IMMATURE GRANULOCYTES: 0 %
LYMPHS ABS: 0.5 10*3/uL — AB (ref 0.7–4.0)
Lymphocytes Relative: 16 %
MCH: 31.1 pg (ref 26.0–34.0)
MCHC: 33.8 g/dL (ref 30.0–36.0)
MCV: 92 fL (ref 80.0–100.0)
MONOS PCT: 8 %
Monocytes Absolute: 0.2 10*3/uL (ref 0.1–1.0)
NEUTROS PCT: 74 %
Neutro Abs: 2.4 10*3/uL (ref 1.7–7.7)
Platelet Count: 155 10*3/uL (ref 150–400)
RBC: 3.73 MIL/uL — ABNORMAL LOW (ref 4.22–5.81)
RDW: 13.5 % (ref 11.5–15.5)
WBC Count: 3.2 10*3/uL — ABNORMAL LOW (ref 4.0–10.5)
nRBC: 0 % (ref 0.0–0.2)

## 2018-03-19 LAB — SAVE SMEAR(SSMR), FOR PROVIDER SLIDE REVIEW

## 2018-03-19 NOTE — Telephone Encounter (Signed)
Gave avs and calendar ° °

## 2018-03-20 DIAGNOSIS — M25562 Pain in left knee: Secondary | ICD-10-CM | POA: Diagnosis not present

## 2018-03-20 DIAGNOSIS — M25552 Pain in left hip: Secondary | ICD-10-CM | POA: Diagnosis not present

## 2018-03-20 DIAGNOSIS — M1711 Unilateral primary osteoarthritis, right knee: Secondary | ICD-10-CM | POA: Diagnosis not present

## 2018-03-20 DIAGNOSIS — M545 Low back pain: Secondary | ICD-10-CM | POA: Diagnosis not present

## 2018-03-20 LAB — MULTIPLE MYELOMA PANEL, SERUM
ALPHA 1: 0.2 g/dL (ref 0.0–0.4)
ALPHA2 GLOB SERPL ELPH-MCNC: 0.8 g/dL (ref 0.4–1.0)
Albumin SerPl Elph-Mcnc: 4.1 g/dL (ref 2.9–4.4)
Albumin/Glob SerPl: 1 (ref 0.7–1.7)
B-Globulin SerPl Elph-Mcnc: 0.8 g/dL (ref 0.7–1.3)
Gamma Glob SerPl Elph-Mcnc: 2.3 g/dL — ABNORMAL HIGH (ref 0.4–1.8)
Globulin, Total: 4.2 g/dL — ABNORMAL HIGH (ref 2.2–3.9)
IGG (IMMUNOGLOBIN G), SERUM: 2606 mg/dL — AB (ref 700–1600)
IGM (IMMUNOGLOBULIN M), SRM: 38 mg/dL (ref 20–172)
IgA: 21 mg/dL — ABNORMAL LOW (ref 61–437)
M Protein SerPl Elph-Mcnc: 2.1 g/dL — ABNORMAL HIGH
TOTAL PROTEIN ELP: 8.3 g/dL (ref 6.0–8.5)

## 2018-03-20 LAB — BETA 2 MICROGLOBULIN, SERUM: BETA 2 MICROGLOBULIN: 2 mg/L (ref 0.6–2.4)

## 2018-03-20 LAB — KAPPA/LAMBDA LIGHT CHAINS
Kappa free light chain: 32.8 mg/L — ABNORMAL HIGH (ref 3.3–19.4)
Kappa, lambda light chain ratio: 3.35 — ABNORMAL HIGH (ref 0.26–1.65)
Lambda free light chains: 9.8 mg/L (ref 5.7–26.3)

## 2018-03-22 ENCOUNTER — Telehealth: Payer: Self-pay

## 2018-03-22 NOTE — Telephone Encounter (Signed)
Patient returned nurse call to schedule a BMBX for 04/02/18 at 7:30 am.  Message sent to scheduler to make appointment and lab for after.  Spoke with Estill Bamberg in cytometry to schedule BMBX at 8 am.

## 2018-04-01 ENCOUNTER — Other Ambulatory Visit: Payer: Self-pay | Admitting: *Deleted

## 2018-04-01 DIAGNOSIS — E8809 Other disorders of plasma-protein metabolism, not elsewhere classified: Secondary | ICD-10-CM

## 2018-04-02 ENCOUNTER — Inpatient Hospital Stay: Payer: Medicare Other

## 2018-04-02 ENCOUNTER — Inpatient Hospital Stay (HOSPITAL_BASED_OUTPATIENT_CLINIC_OR_DEPARTMENT_OTHER): Payer: Medicare Other | Admitting: Adult Health

## 2018-04-02 ENCOUNTER — Other Ambulatory Visit: Payer: Self-pay

## 2018-04-02 VITALS — BP 148/97 | HR 85 | Temp 98.1°F | Resp 18

## 2018-04-02 DIAGNOSIS — D72819 Decreased white blood cell count, unspecified: Secondary | ICD-10-CM | POA: Diagnosis not present

## 2018-04-02 DIAGNOSIS — I10 Essential (primary) hypertension: Secondary | ICD-10-CM | POA: Diagnosis not present

## 2018-04-02 DIAGNOSIS — D4989 Neoplasm of unspecified behavior of other specified sites: Secondary | ICD-10-CM | POA: Diagnosis not present

## 2018-04-02 DIAGNOSIS — D649 Anemia, unspecified: Secondary | ICD-10-CM | POA: Diagnosis not present

## 2018-04-02 DIAGNOSIS — D61818 Other pancytopenia: Secondary | ICD-10-CM | POA: Diagnosis not present

## 2018-04-02 DIAGNOSIS — E785 Hyperlipidemia, unspecified: Secondary | ICD-10-CM | POA: Diagnosis not present

## 2018-04-02 DIAGNOSIS — E8809 Other disorders of plasma-protein metabolism, not elsewhere classified: Secondary | ICD-10-CM

## 2018-04-02 DIAGNOSIS — Z791 Long term (current) use of non-steroidal anti-inflammatories (NSAID): Secondary | ICD-10-CM | POA: Diagnosis not present

## 2018-04-02 LAB — CBC WITH DIFFERENTIAL (CANCER CENTER ONLY)
Abs Immature Granulocytes: 0.01 10*3/uL (ref 0.00–0.07)
BASOS PCT: 1 %
Basophils Absolute: 0 10*3/uL (ref 0.0–0.1)
EOS ABS: 0.1 10*3/uL (ref 0.0–0.5)
Eosinophils Relative: 2 %
HCT: 34.7 % — ABNORMAL LOW (ref 39.0–52.0)
Hemoglobin: 11.6 g/dL — ABNORMAL LOW (ref 13.0–17.0)
IMMATURE GRANULOCYTES: 0 %
Lymphocytes Relative: 12 %
Lymphs Abs: 0.4 10*3/uL — ABNORMAL LOW (ref 0.7–4.0)
MCH: 31.1 pg (ref 26.0–34.0)
MCHC: 33.4 g/dL (ref 30.0–36.0)
MCV: 93 fL (ref 80.0–100.0)
MONOS PCT: 6 %
Monocytes Absolute: 0.2 10*3/uL (ref 0.1–1.0)
NEUTROS PCT: 79 %
Neutro Abs: 2.8 10*3/uL (ref 1.7–7.7)
PLATELETS: 142 10*3/uL — AB (ref 150–400)
RBC: 3.73 MIL/uL — ABNORMAL LOW (ref 4.22–5.81)
RDW: 13.5 % (ref 11.5–15.5)
WBC Count: 3.5 10*3/uL — ABNORMAL LOW (ref 4.0–10.5)
nRBC: 0 % (ref 0.0–0.2)

## 2018-04-02 NOTE — Patient Instructions (Signed)

## 2018-04-02 NOTE — Progress Notes (Signed)
Vital signs taken prior to discharge. Discharge instructions reviewed and patient verbalized understanding. Patient knows to call the office for any bleeding or worsening pain. Site assessed. Dressing is clean, dry, and intact with no bleeding noted. Escorted patient over to the lab for labwork.

## 2018-04-02 NOTE — Progress Notes (Signed)
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  The patient's name, date of birth, identification, consent and allergies were verified prior to the start of procedure and time out was performed.  The left posterior iliac crest was chosen as the site of biopsy.  The skin was prepped with ChloraPrep.   10 cc of 2% lidocaine was used to provide local anaesthesia.   10 cc of bone marrow aspirate was obtained followed by 1cm biopsy.  Pressure was applied to the biopsy site and bandage was placed over the biopsy site. Patient was made to lie on the back for 30 mins prior to discharge.  The procedure was tolerated well. COMPLICATIONS: None BLOOD LOSS: none The patient was discharged home in stable condition with a 1 week follow up to review results.  Patient was provided with post bone marrow biopsy instructions and instructed to call if there was any bleeding or worsening pain.  Specimens sent for flow cytometry, cytogenetics and additional studies.  Signed Scot Dock, NP

## 2018-04-05 NOTE — Progress Notes (Signed)
[facility]  [phone] [phone]    ID: Walter L Mijangos Jr. DOB: 11/11/1950  MR#: [id number]  CSN#:[id number]  Patient Care Team: Pharr, Walter, MD as PCP - General (Internal Medicine) Ottelin, Mark, MD as Consulting Physician (Urology) Rowan, Frank, MD as Consulting Physician (Orthopedic Surgery) Dumonski, Mark, MD as Consulting Physician (Orthopedic Surgery) Magrinat, Gustav C, MD as Consulting Physician (Hematology and Oncology) OTHER MD:  0331 2020: Phone visit   CHIEF COMPLAINT: M-GUS  CURRENT TREATMENT: observation   HISTORY OF CURRENT ILLNESS: Walter L Danish Jr. is followed by Dr. Pharr, who noted a high total protein at 8.8 with an albumin of 4.0.  An SPEP was obtained on 03/05/2018, showing an M spike of 2.3.  Immunofixation was not performed.  The patient was also found to be mildly anemic, and to have a slightly low white cell count.  Given these findings he is being referred for evaluation of plasma cell dyscrasia, possibly myeloma.  The patient's subsequent history is as detailed below.   INTERVAL HISTORY: I reviewed all the lab results with Walter White and his wife via speaker phone.  These are detailed below  Since his last visit, he underwent bone marrow biopsy on 04/02/2018. Pathology from the procedure ([id number]) showed: slightly hypercellular bone marrow for age with plasma cell neoplasm; trilineage hematopoiesis. Peripheral blood showed pancytopenia.   REVIEW OF SYSTEMS: Walter White is taking appropriate precautions regarding the current pandemic.  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  He tolerated the bone marrow biopsy without incident   PAST MEDICAL HISTORY: Past Medical History:  Diagnosis Date  . AAA (abdominal aortic aneurysm) (HCC)    4.0 cm AAA 08/22/17 U/S  . Allergy    Allergic Rhinitis  . Anticipated difficulty with intubation   . Arthritis    spinal steniosis  . At risk for difficult intubation    no  trouble intubating as far as pt knows- swelling afterward -above note previous  . Benign prostate hyperplasia    with lower urinary tract symptoms  . Carpal tunnel syndrome on right   . Cataracts, both eyes   . Chronic back pain    caused by spinal stenosis  . 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 Elevated PSA   . GERD (gastroesophageal reflux disease)   . Hyperlipidemia   . Hypertension    Essential.    . Microhematuria   . OA (osteoarthritis) of knee   . Pneumonia    Hx -when very young  . Pre-diabetes   . Seasonal allergies   . Spinal stenosis    of lumbosacral region.  Causes chronic back pain.  . Upper abdominal pain      PAST SURGICAL HISTORY: Past Surgical History:  Procedure Laterality Date  . ABDOMINAL EXPOSURE N/A 09/05/2017   Procedure: ABDOMINAL EXPOSURE;  Surgeon: Early, Todd F, MD;  Location: [facility] OR;  Service: Vascular;  Laterality: N/A;  . ANTERIOR LAT LUMBAR FUSION Left 01/26/2016   Procedure: LEFT SIDED LATERAL INTERBODY FUSION, LUMBAR 3-4 WITH INSTRUMENTATION AND ALLOGRAFT;  Surgeon: Mark Dumonski, MD;  Location: [facility] OR;  Service: Orthopedics;  Laterality: Left;  LEFT SIDED LATERAL INTERBODY FUSION, LUMBAR 3-4 WITH INSTRUMENTATION AND ALLOGRAFT  . ANTERIOR LATERAL LUMBAR FUSION WITH PERCUTANEOUS SCREW 1 LEVEL N/A 09/05/2017   Procedure: LUMBAR 5 - SACRUM 1 ANTERIOR LUMBAR INTERBODY FUSION WITH INSTRUMENTATION AND ALLOGRAFT;  Surgeon:   Dumonski, Mark, MD;  Location: [facility] OR;  Service: Orthopedics;  Laterality: N/A;  . BACK SURGERY  01/2015   dr. dumonski. 2017,2018.  . CARPAL TUNNEL RELEASE     right   . COLONOSCOPY    . COLONOSCOPY  2010   polyp  . ELBOW SURGERY     right ulnar release  . ELBOW SURGERY     LEFT CTR  . JOINT REPLACEMENT     left- Sept 2016  . KNEE ARTHROSCOPY  11/23/2010   Procedure: ARTHROSCOPY KNEE;  Surgeon: Frank J Rowan;  Location: [facility];  Service: Orthopedics;  Laterality: Left;  arthroscopy left knee  . KNEE ARTHROSCOPY     left x 2  . Knee scar tissue removal Left 2018  . LATERAL FUSION LUMBAR SPINE  01./17/2018  . polypectomy  2010  . SHOULDER ARTHROSCOPY Left    x2  . TONSILLECTOMY    . TOTAL KNEE ARTHROPLASTY Left 09/21/2014   Procedure: TOTAL KNEE ARTHROPLASTY;  Surgeon: Frank Rowan, MD;  Location: [facility];  Service: Orthopedics;  Laterality: Left;  . ULNAR NERVE TRANSPOSITION Left 04/16/2017   Procedure: LEFT ULNAR NEUROPLASTY AT THE WRIST AND TRANSPOSITION AT THE ELBOW;  Surgeon: Thompson, David, MD;  Location: [facility];  Service: Orthopedics;  Laterality: Left;  . WISDOM TOOTH EXTRACTION       FAMILY HISTORY: Family History  Problem Relation Age of Onset  . Cancer Mother   . Liver disease Father   . Colon cancer Neg Hx   . Colon polyps Neg Hx   . Esophageal cancer Neg Hx   . Rectal cancer Neg Hx   . Stomach cancer Neg Hx    Walter White's father died from heart failure at age 64. Patients' mother died from lung cancer at age 47. The patient has 1 sister, who is deceased due to a motor vehicle accident. Patient denies anyone in her family having breast, ovarian, prostate, or pancreatic cancer.    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 His wife, Cynthia, is a retired teacher. Walter White's son Chris graduated with his PhD in History, but he currently works at [employer] in the grants department. Bartlomiej's other son Chris, is a dispatcher for [employer] and coaches football at [employer]. Walter White has two grandchildren. He attends [employer].    ADVANCED DIRECTIVES: Lyriq's wife, Cynthia, is automatically his healthcare power of attorney.     HEALTH MAINTENANCE: Social History   Tobacco Use  . Smoking status: Former Smoker    Last attempt to quit: 11/21/1980    Years since quitting: 37.4  . Smokeless tobacco:  Never Used  Substance Use Topics  . Alcohol use: Yes    Alcohol/week: 0.0 standard drinks    Comment: Very rare  . Drug use: No    Colonoscopy: due 04/2018  Bone density:    Allergies  Allergen Reactions  . Pork-Derived Products Shortness Of Breath  . Shellfish Allergy Shortness Of Breath  . Lovastatin Other (See Comments)    Bone pain   . Lactose Intolerance (Gi) Other (See Comments)    Dairy--allergic per allergy test  . Penicillins     UNSPECIFIED CHILDHOOD REACTION  Has patient had a PCN reaction causing immediate rash, facial/tongue/throat swelling, SOB or lightheadedness with hypotension: Unknown Has patient had a PCN reaction causing severe rash involving mucus membranes or skin necrosis: Unknown Has patient had a PCN reaction that required hospitalization:   Unknown Has patient had a PCN reaction occurring within the last 10 years: No If all of the above answers are "NO", then may proceed with Cephalosporin use.     Current Outpatient Medications  Medication Sig Dispense Refill  . diclofenac (VOLTAREN) 75 MG EC tablet     . docusate sodium (COLACE) 100 MG capsule Take 100 mg by mouth daily.     . HYDROcodone-acetaminophen (NORCO/VICODIN) 5-325 MG tablet     . lisinopril-hydrochlorothiazide (PRINZIDE,ZESTORETIC) 20-25 MG tablet Take 1 tablet by mouth daily.    . Nutritional Supplements (JUICE PLUS FIBRE PO) Take 6 capsules by mouth daily. Juice Plus+ Orchard Blend 2 capsules in the morning Juice Plus+ Garden Blend 2 capsules in the morning Juice Plus+ Vineyard Blend 2 capsule in the morning    . pantoprazole (PROTONIX) 40 MG tablet Take 40 mg by mouth daily.    . rosuvastatin (CRESTOR) 5 MG tablet Take 5 mg by mouth daily.    . Tamsulosin HCl (FLOMAX) 0.4 MG CAPS Take 0.4 mg by mouth at bedtime.     . Tetrahydrozoline HCl (VISINE OP) Place 1 drop into both eyes daily as needed (irritation).    . traMADol (ULTRAM) 50 MG tablet      No current facility-administered  medications for this visit.      OBJECTIVE: Middle-aged white man   There were no vitals filed for this visit.   There is no height or weight on file to calculate BMI.   Wt Readings from Last 3 Encounters:  03/19/18 232 lb 6.4 oz (105.4 kg)  09/05/17 234 lb (106.1 kg)  09/03/17 234 lb (106.1 kg)      ECOG FS:1 - Symptomatic but completely ambulatory   LAB RESULTS:  CMP     Component Value Date/Time   NA 138 03/19/2018 1047   K 4.0 03/19/2018 1047   CL 105 03/19/2018 1047   CO2 26 03/19/2018 1047   GLUCOSE 167 (H) 03/19/2018 1047   BUN 25 (H) 03/19/2018 1047   CREATININE 1.01 03/19/2018 1047   CALCIUM 9.7 03/19/2018 1047   PROT 9.0 (H) 03/19/2018 1047   ALBUMIN 4.0 03/19/2018 1047   AST 30 03/19/2018 1047   ALT 26 03/19/2018 1047   ALKPHOS 85 03/19/2018 1047   BILITOT 0.5 03/19/2018 1047   GFRNONAA >60 03/19/2018 1047   GFRAA >60 03/19/2018 1047    Lab Results  Component Value Date   TOTALPROTELP 8.3 03/19/2018     Lab Results  Component Value Date   KPAFRELGTCHN 32.8 (H) 03/19/2018   LAMBDASER 9.8 03/19/2018   KAPLAMBRATIO 3.35 (H) 03/19/2018    Lab Results  Component Value Date   WBC 3.5 (L) 04/02/2018   NEUTROABS 2.8 04/02/2018   HGB 11.6 (L) 04/02/2018   HCT 34.7 (L) 04/02/2018   MCV 93.0 04/02/2018   PLT 142 (L) 04/02/2018    @LASTCHEMISTRY@  No results found for: LABCA2  No components found for: LABCAN125  No results for input(s): INR in the last 168 hours.  No results found for: LABCA2  No results found for: CAN199  No results found for: CAN125  No results found for: CAN153  No results found for: CA2729  No components found for: HGQUANT  No results found for: CEA1 / No results found for: CEA1   No results found for: AFPTUMOR  No results found for: CHROMOGRNA  No results found for: PSA1  No visits with results within 3 Day(s) from this visit.  Latest known visit with   results is:  Appointment on 04/02/2018  Component  Date Value Ref Range Status  . WBC Count 04/02/2018 3.5* 4.0 - 10.5 K/uL Final  . RBC 04/02/2018 3.73* 4.22 - 5.81 MIL/uL Final  . Hemoglobin 04/02/2018 11.6* 13.0 - 17.0 g/dL Final  . HCT 04/02/2018 34.7* 39.0 - 52.0 % Final  . MCV 04/02/2018 93.0  80.0 - 100.0 fL Final  . MCH 04/02/2018 31.1  26.0 - 34.0 pg Final  . MCHC 04/02/2018 33.4  30.0 - 36.0 g/dL Final  . RDW 04/02/2018 13.5  11.5 - 15.5 % Final  . Platelet Count 04/02/2018 142* 150 - 400 K/uL Final  . nRBC 04/02/2018 0.0  0.0 - 0.2 % Final  . Neutrophils Relative % 04/02/2018 79  % Final  . Neutro Abs 04/02/2018 2.8  1.7 - 7.7 K/uL Final  . Lymphocytes Relative 04/02/2018 12  % Final  . Lymphs Abs 04/02/2018 0.4* 0.7 - 4.0 K/uL Final  . Monocytes Relative 04/02/2018 6  % Final  . Monocytes Absolute 04/02/2018 0.2  0.1 - 1.0 K/uL Final  . Eosinophils Relative 04/02/2018 2  % Final  . Eosinophils Absolute 04/02/2018 0.1  0.0 - 0.5 K/uL Final  . Basophils Relative 04/02/2018 1  % Final  . Basophils Absolute 04/02/2018 0.0  0.0 - 0.1 K/uL Final  . Immature Granulocytes 04/02/2018 0  % Final  . Abs Immature Granulocytes 04/02/2018 0.01  0.00 - 0.07 K/uL Final   Performed at [facility], [address] [place], [place] [address]    (this displays the last labs from the last 3 days)  Lab Results  Component Value Date   TOTALPROTELP 8.3 03/19/2018   (this displays SPEP labs)  Lab Results  Component Value Date   KPAFRELGTCHN 32.8 (H) 03/19/2018   LAMBDASER 9.8 03/19/2018   KAPLAMBRATIO 3.35 (H) 03/19/2018   (kappa/lambda light chains)  No results found for: HGBA, HGBA2QUANT, HGBFQUANT, HGBSQUAN (Hemoglobinopathy evaluation)   No results found for: LDH  No results found for: IRON, TIBC, IRONPCTSAT (Iron and TIBC)  No results found for: FERRITIN  Urinalysis    Component Value Date/Time   COLORURINE YELLOW 08/28/2017 [place] 08/28/2017 0925   LABSPEC 1.017 08/28/2017  0925   PHURINE 5.0 08/28/2017 [place] 08/28/2017 [place] 08/28/2017 [place] 08/28/2017 0925   KETONESUR NEGATIVE 08/28/2017 0925   PROTEINUR NEGATIVE 08/28/2017 0925   UROBILINOGEN 0.2 09/10/2014 0852   NITRITE NEGATIVE 08/28/2017 0925   LEUKOCYTESUR NEGATIVE 08/28/2017 0925     STUDIES:  MRI of the lumbar spine and CT of the lumbar spine 01/15/2018 showed degenerative disease and postoperative changes but no obvious lytic lesions.  CT of the pelvis and lumbar spine without contrast again shows no evidence of lytic lesions.  Hest x-ray 08/28/2017 shows no bony abnormalities.  ELIGIBLE FOR AVAILABLE RESEARCH PROTOCOL: no   ASSESSMENT: 68 y.o. [place], [place] man with a diagnosis of M-GUS confirmed March 2020 with the following parameters:  2.1 g M spike on SPEP obtained 03/05/2018, IgG kappa by IFE  Kappa/lambda ratio 3.35, kappa at 32.8 mg/L  Beta-2-microglobulin 2.0  IgG/A/M 2606/21/38  WBC 3.2, ANC 2.4, Hb 11.6, MCV 92.0, platelets 155  Creat 1.01, Ca++ 9.7, alb 4.0  Bone studies: no lytic lesions  Bone marrow biopsy 04/02/2018: 9% plasma cells, kappa restricted    PLAN: I called Jaiquan to discuss these results by phone, given the current pandemic precautions.  He understands his diagnosis is an-MGUS, that this is a benign condition, that it may or may not at some point progress to a type of cancer called myeloma, but that all he needs at this point is observation.  I have made him a return appointment here in 6 months.  We will obtain labs before that visit.  I will then see him again in March or April 2021 and from that point I will start following him on a yearly basis  He expressed a good understanding of the situation.  He knows to call with any questions or concerns.   Dionisia Pacholski, Virgie Dad, MD  04/09/18 8:42 AM Medical Oncology and Hematology [facility] [address] [place], [place] [address] Tel.  [phone]    Fax. [phone]

## 2018-04-09 ENCOUNTER — Encounter: Payer: Self-pay | Admitting: Oncology

## 2018-04-09 ENCOUNTER — Inpatient Hospital Stay (HOSPITAL_BASED_OUTPATIENT_CLINIC_OR_DEPARTMENT_OTHER): Payer: Medicare Other | Admitting: Oncology

## 2018-04-09 ENCOUNTER — Other Ambulatory Visit: Payer: Self-pay | Admitting: Oncology

## 2018-04-09 DIAGNOSIS — E8809 Other disorders of plasma-protein metabolism, not elsewhere classified: Secondary | ICD-10-CM

## 2018-04-09 DIAGNOSIS — D472 Monoclonal gammopathy: Secondary | ICD-10-CM

## 2018-04-10 ENCOUNTER — Telehealth: Payer: Self-pay | Admitting: Oncology

## 2018-04-10 ENCOUNTER — Encounter (HOSPITAL_COMMUNITY): Payer: Self-pay | Admitting: Oncology

## 2018-04-10 NOTE — Telephone Encounter (Signed)
Scheduled appt per 3/31 sch message - sent reminder letter in the mail

## 2018-04-10 NOTE — Telephone Encounter (Signed)
No 3/31 los °

## 2018-05-16 DIAGNOSIS — M5136 Other intervertebral disc degeneration, lumbar region: Secondary | ICD-10-CM | POA: Diagnosis not present

## 2018-06-04 ENCOUNTER — Other Ambulatory Visit (HOSPITAL_COMMUNITY): Payer: Self-pay | Admitting: Orthopedic Surgery

## 2018-06-04 ENCOUNTER — Other Ambulatory Visit: Payer: Self-pay | Admitting: Orthopedic Surgery

## 2018-06-04 DIAGNOSIS — M25562 Pain in left knee: Secondary | ICD-10-CM | POA: Diagnosis not present

## 2018-06-04 DIAGNOSIS — M4696 Unspecified inflammatory spondylopathy, lumbar region: Secondary | ICD-10-CM | POA: Diagnosis not present

## 2018-06-05 DIAGNOSIS — H6122 Impacted cerumen, left ear: Secondary | ICD-10-CM | POA: Diagnosis not present

## 2018-06-05 DIAGNOSIS — S00411A Abrasion of right ear, initial encounter: Secondary | ICD-10-CM | POA: Diagnosis not present

## 2018-06-05 DIAGNOSIS — J343 Hypertrophy of nasal turbinates: Secondary | ICD-10-CM | POA: Diagnosis not present

## 2018-06-05 DIAGNOSIS — Z7289 Other problems related to lifestyle: Secondary | ICD-10-CM | POA: Diagnosis not present

## 2018-06-05 DIAGNOSIS — H9012 Conductive hearing loss, unilateral, left ear, with unrestricted hearing on the contralateral side: Secondary | ICD-10-CM | POA: Diagnosis not present

## 2018-06-05 DIAGNOSIS — Z87891 Personal history of nicotine dependence: Secondary | ICD-10-CM | POA: Diagnosis not present

## 2018-06-17 ENCOUNTER — Encounter (HOSPITAL_COMMUNITY): Payer: Medicare Other

## 2018-06-17 ENCOUNTER — Other Ambulatory Visit (HOSPITAL_COMMUNITY): Payer: Medicare Other

## 2018-06-20 ENCOUNTER — Telehealth: Payer: Self-pay | Admitting: *Deleted

## 2018-06-20 DIAGNOSIS — M48061 Spinal stenosis, lumbar region without neurogenic claudication: Secondary | ICD-10-CM | POA: Diagnosis not present

## 2018-06-20 DIAGNOSIS — M47816 Spondylosis without myelopathy or radiculopathy, lumbar region: Secondary | ICD-10-CM | POA: Diagnosis not present

## 2018-06-20 NOTE — Telephone Encounter (Signed)
John,  This pt is scheduled for a COLON  With Dr Henrene Pastor 07-16-18. In his history , there is noted a difficult intubation in the early 1980's- states throat swelled-  He has had two colonoscopies here, last 2016 with no issues. The notes state he has had several surgeries with no issues.  It does not appear that any one checked with you at his last colon. Can you please review his chart and let me know if he qualifies here   Thanks, Lelan Pons

## 2018-06-21 ENCOUNTER — Encounter (HOSPITAL_COMMUNITY)
Admission: RE | Admit: 2018-06-21 | Discharge: 2018-06-21 | Disposition: A | Discharge status: Home / Self Care | Payer: Medicare Other | Source: Ambulatory Visit | Attending: Orthopedic Surgery | Admitting: Orthopedic Surgery

## 2018-06-21 ENCOUNTER — Other Ambulatory Visit: Payer: Self-pay

## 2018-06-21 DIAGNOSIS — Z96652 Presence of left artificial knee joint: Secondary | ICD-10-CM | POA: Diagnosis not present

## 2018-06-21 DIAGNOSIS — M25562 Pain in left knee: Secondary | ICD-10-CM

## 2018-06-21 DIAGNOSIS — Z471 Aftercare following joint replacement surgery: Secondary | ICD-10-CM | POA: Diagnosis not present

## 2018-06-21 MED ORDER — TECHNETIUM TC 99M MEDRONATE IV KIT
20.0000 | PACK | Freq: Once | INTRAVENOUS | Status: AC | PRN
  Administered 2018-06-21: 20 via INTRAVENOUS

## 2018-06-21 NOTE — Telephone Encounter (Signed)
Lelan Pons,  I reviewed this pt's chart.  Prior to his colonoscopy in 2016 he had not been diagnosed as "difficult intubation".  On 09/05/17 Dr. Andres Shad, anesthesiologist, designated him as a difficult intubation.  Consequently he does not qualify for care at [facility].  Thanks,  Osvaldo Angst

## 2018-06-24 ENCOUNTER — Other Ambulatory Visit: Payer: Self-pay

## 2018-06-24 ENCOUNTER — Telehealth: Payer: Self-pay

## 2018-06-24 DIAGNOSIS — Z86010 Personal history of colonic polyps: Secondary | ICD-10-CM

## 2018-06-24 DIAGNOSIS — Z1283 Encounter for screening for malignant neoplasm of skin: Secondary | ICD-10-CM | POA: Diagnosis not present

## 2018-06-24 DIAGNOSIS — L821 Other seborrheic keratosis: Secondary | ICD-10-CM | POA: Diagnosis not present

## 2018-06-24 MED ORDER — NA SULFATE-K SULFATE-MG SULF 17.5-3.13-1.6 GM/177ML PO SOLN: 1.0000 | Freq: Once | ORAL | 0 refills | Status: AC

## 2018-06-24 NOTE — Telephone Encounter (Signed)
Spoke with patient and explained the need to change his procedure to the hospital.  Mailed instructions.

## 2018-06-24 NOTE — Telephone Encounter (Signed)
Lm on home vm and cell

## 2018-06-24 NOTE — Telephone Encounter (Signed)
Printed instructions for colon, amb referral

## 2018-06-25 DIAGNOSIS — Z96652 Presence of left artificial knee joint: Secondary | ICD-10-CM | POA: Diagnosis not present

## 2018-06-25 DIAGNOSIS — M25562 Pain in left knee: Secondary | ICD-10-CM | POA: Diagnosis not present

## 2018-06-28 ENCOUNTER — Telehealth: Payer: Self-pay

## 2018-06-28 NOTE — Telephone Encounter (Signed)
Patient is concerned about having a procedure at the hospital during this time and also has some questions about the need for it.  He was diagnosed as a difficult intubation and he does not remember being told that or having a problem after back surgery last year.  I left a message with Osvaldo Angst to see if he could help me answer patient's questions.  Patient asked that I cancel his hospital procedure for the time being.

## 2018-07-01 NOTE — Telephone Encounter (Signed)
You can schedule him for a telehealth telephone visit to discuss this further.  Thanks

## 2018-07-01 NOTE — Telephone Encounter (Signed)
Magda Paganini,  This morning I had a 20 min conversation with Walter White and his wife.  I described his operative history and his dx of difficult intubation.  They seem satisfied and understanding of why his procedure needs to be done at the hospital.  He is hesitant to go to the hospital in the current Covid environment but would like you to call.  Thanks,  Osvaldo Angst

## 2018-07-04 NOTE — Telephone Encounter (Signed)
Called and LM for pt to call back to schedule a phone visit  with Dr. Henrene Pastor to discuss hospital procedure.

## 2018-07-09 ENCOUNTER — Telehealth: Payer: Self-pay | Admitting: Internal Medicine

## 2018-07-09 DIAGNOSIS — M25562 Pain in left knee: Secondary | ICD-10-CM | POA: Diagnosis not present

## 2018-07-09 DIAGNOSIS — M48061 Spinal stenosis, lumbar region without neurogenic claudication: Secondary | ICD-10-CM | POA: Diagnosis not present

## 2018-07-09 NOTE — Telephone Encounter (Signed)
Patient call advised that he was told to call back if he had any additional questions

## 2018-07-10 ENCOUNTER — Encounter: Payer: Medicare Other | Admitting: Internal Medicine

## 2018-07-16 ENCOUNTER — Encounter: Payer: Medicare Other | Admitting: Internal Medicine

## 2018-07-22 DIAGNOSIS — M5416 Radiculopathy, lumbar region: Secondary | ICD-10-CM | POA: Diagnosis not present

## 2018-07-30 ENCOUNTER — Other Ambulatory Visit: Payer: Self-pay | Admitting: Orthopedic Surgery

## 2018-07-30 ENCOUNTER — Telehealth: Payer: Self-pay | Admitting: Internal Medicine

## 2018-07-30 NOTE — Telephone Encounter (Signed)
Lm on vm 

## 2018-07-30 NOTE — Telephone Encounter (Signed)
Pt would like to know if we placed order for COVID-19 test at [facility].

## 2018-07-30 NOTE — Telephone Encounter (Signed)
Patient's wife called just to make sure the covid test that was scheduled for his now cancelled colonoscopy because it kept showing on my chart.  I clarified that it was in fact cancelled.

## 2018-08-01 ENCOUNTER — Other Ambulatory Visit (HOSPITAL_COMMUNITY): Payer: Medicare Other

## 2018-08-01 ENCOUNTER — Other Ambulatory Visit (HOSPITAL_COMMUNITY): Admission: RE | Admit: 2018-08-01 | Payer: Medicare Other | Source: Ambulatory Visit

## 2018-08-02 NOTE — Telephone Encounter (Signed)
Patient having surgery soon so will reschedule this when he has recovered

## 2018-08-05 ENCOUNTER — Ambulatory Visit (HOSPITAL_COMMUNITY): Admit: 2018-08-05 | Payer: Medicare Other | Admitting: Internal Medicine

## 2018-08-05 ENCOUNTER — Encounter (HOSPITAL_COMMUNITY): Payer: Self-pay

## 2018-08-05 SURGERY — COLONOSCOPY WITH PROPOFOL
Anesthesia: Monitor Anesthesia Care

## 2018-08-07 ENCOUNTER — Encounter (HOSPITAL_COMMUNITY): Payer: Self-pay

## 2018-08-07 ENCOUNTER — Encounter (HOSPITAL_COMMUNITY)
Admission: RE | Admit: 2018-08-07 | Discharge: 2018-08-07 | Disposition: A | Discharge status: Home / Self Care | Payer: Medicare Other | Source: Ambulatory Visit | Attending: Orthopedic Surgery | Admitting: Orthopedic Surgery

## 2018-08-07 ENCOUNTER — Other Ambulatory Visit: Payer: Self-pay

## 2018-08-07 DIAGNOSIS — K219 Gastro-esophageal reflux disease without esophagitis: Secondary | ICD-10-CM | POA: Diagnosis not present

## 2018-08-07 DIAGNOSIS — Z96652 Presence of left artificial knee joint: Secondary | ICD-10-CM | POA: Diagnosis not present

## 2018-08-07 DIAGNOSIS — Z20828 Contact with and (suspected) exposure to other viral communicable diseases: Secondary | ICD-10-CM | POA: Insufficient documentation

## 2018-08-07 DIAGNOSIS — R7303 Prediabetes: Secondary | ICD-10-CM | POA: Insufficient documentation

## 2018-08-07 DIAGNOSIS — M4316 Spondylolisthesis, lumbar region: Secondary | ICD-10-CM | POA: Insufficient documentation

## 2018-08-07 DIAGNOSIS — I714 Abdominal aortic aneurysm, without rupture: Secondary | ICD-10-CM | POA: Insufficient documentation

## 2018-08-07 DIAGNOSIS — Z01818 Encounter for other preprocedural examination: Secondary | ICD-10-CM | POA: Diagnosis not present

## 2018-08-07 DIAGNOSIS — G8929 Other chronic pain: Secondary | ICD-10-CM | POA: Insufficient documentation

## 2018-08-07 DIAGNOSIS — Z79899 Other long term (current) drug therapy: Secondary | ICD-10-CM | POA: Diagnosis not present

## 2018-08-07 DIAGNOSIS — Z791 Long term (current) use of non-steroidal anti-inflammatories (NSAID): Secondary | ICD-10-CM | POA: Diagnosis not present

## 2018-08-07 DIAGNOSIS — M5136 Other intervertebral disc degeneration, lumbar region: Secondary | ICD-10-CM | POA: Insufficient documentation

## 2018-08-07 DIAGNOSIS — I10 Essential (primary) hypertension: Secondary | ICD-10-CM | POA: Diagnosis not present

## 2018-08-07 DIAGNOSIS — E785 Hyperlipidemia, unspecified: Secondary | ICD-10-CM | POA: Insufficient documentation

## 2018-08-07 DIAGNOSIS — Z981 Arthrodesis status: Secondary | ICD-10-CM | POA: Diagnosis not present

## 2018-08-07 HISTORY — DX: Monoclonal gammopathy: D47.2

## 2018-08-07 LAB — TYPE AND SCREEN
ABO/RH(D): O POS
Antibody Screen: NEGATIVE

## 2018-08-07 LAB — URINALYSIS, ROUTINE W REFLEX MICROSCOPIC
Bilirubin Urine: NEGATIVE
Glucose, UA: NEGATIVE mg/dL
Hgb urine dipstick: NEGATIVE
Ketones, ur: NEGATIVE mg/dL
Nitrite: NEGATIVE
Protein, ur: NEGATIVE mg/dL
Specific Gravity, Urine: 1.019 (ref 1.005–1.030)
pH: 5 (ref 5.0–8.0)

## 2018-08-07 LAB — COMPREHENSIVE METABOLIC PANEL
ALT: 43 U/L (ref 0–44)
AST: 39 U/L (ref 15–41)
Albumin: 3.8 g/dL (ref 3.5–5.0)
Alkaline Phosphatase: 78 U/L (ref 38–126)
Anion gap: 6 (ref 5–15)
BUN: 33 mg/dL — ABNORMAL HIGH (ref 8–23)
CO2: 24 mmol/L (ref 22–32)
Calcium: 9.4 mg/dL (ref 8.9–10.3)
Chloride: 107 mmol/L (ref 98–111)
Creatinine, Ser: 1.13 mg/dL (ref 0.61–1.24)
GFR calc Af Amer: >60 mL/min (ref >60)
GFR calc non Af Amer: >60 mL/min (ref >60)
Glucose, Bld: 149 mg/dL — ABNORMAL HIGH (ref 70–99)
Potassium: 4.3 mmol/L (ref 3.5–5.1)
Sodium: 137 mmol/L (ref 135–145)
Total Bilirubin: 0.7 mg/dL (ref 0.3–1.2)
Total Protein: 8 g/dL (ref 6.5–8.1)

## 2018-08-07 LAB — CBC WITH DIFFERENTIAL/PLATELET
Abs Immature Granulocytes: 0 10*3/uL (ref 0.00–0.07)
Basophils Absolute: 0 10*3/uL (ref 0.0–0.1)
Basophils Relative: 1 %
Eosinophils Absolute: 0.1 10*3/uL (ref 0.0–0.5)
Eosinophils Relative: 4 %
HCT: 35.1 % — ABNORMAL LOW (ref 39.0–52.0)
Hemoglobin: 11.5 g/dL — ABNORMAL LOW (ref 13.0–17.0)
Immature Granulocytes: 0 %
Lymphocytes Relative: 22 %
Lymphs Abs: 0.6 10*3/uL — ABNORMAL LOW (ref 0.7–4.0)
MCH: 31.5 pg (ref 26.0–34.0)
MCHC: 32.8 g/dL (ref 30.0–36.0)
MCV: 96.2 fL (ref 80.0–100.0)
Monocytes Absolute: 0.3 10*3/uL (ref 0.1–1.0)
Monocytes Relative: 10 %
Neutro Abs: 1.8 10*3/uL (ref 1.7–7.7)
Neutrophils Relative %: 63 %
Platelets: 149 10*3/uL — ABNORMAL LOW (ref 150–400)
RBC: 3.65 MIL/uL — ABNORMAL LOW (ref 4.22–5.81)
RDW: 13.1 % (ref 11.5–15.5)
WBC: 2.8 10*3/uL — ABNORMAL LOW (ref 4.0–10.5)
nRBC: 0 % (ref 0.0–0.2)

## 2018-08-07 LAB — PROTIME-INR
INR: 1.1 (ref 0.8–1.2)
Prothrombin Time: 13.7 seconds (ref 11.4–15.2)

## 2018-08-07 LAB — SURGICAL PCR SCREEN
MRSA, PCR: NEGATIVE
Staphylococcus aureus: NEGATIVE

## 2018-08-07 LAB — APTT: aPTT: 30 seconds (ref 24–36)

## 2018-08-07 NOTE — Progress Notes (Signed)
[address] [place], [place] [facility] [address] [place] [address] Phone: [phone] Fax: [phone]    Your procedure is scheduled on  Monday, August 5th.  Report to [facility] Main Entrance "A" at 6:30 A.M., and check in at the Admitting office.  Call this number if you have problems the morning of surgery:  [phone]  Call [phone] if you have any questions prior to your surgery date Monday-Friday 8am-4pm   Remember:  Do not eat after midnight the night before your surgery  You may drink clear liquids until 5:30 the morning of your surgery.   Clear liquids allowed are: Water, Non-Citrus Juices (without pulp), Carbonated Beverages, Clear Tea, Black Coffee Only, and Gatorade    Take these medicines the morning of surgery with A SIP OF WATER  docusate sodium (COLACE) gabapentin (NEURONTIN) HYDROcodone-acetaminophen (NORCO/VICODIN)  methocarbamol (ROBAXIN)  pantoprazole (PROTONIX) rosuvastatin (CRESTOR)   If needed - Tetrahydrozoline HCl (VISINE OP) - eyedrops  7 days prior to surgery STOP taking any Aspirin (unless otherwise instructed by your surgeon), diclofenac (VOLTAREN) Aleve, Naproxen, Ibuprofen, Motrin, Advil, Goody's, BC's, all herbal medications, fish oil, and all vitamins.   The Morning of Surgery  Do not wear jewelry, make-up or nail polish.  Do not wear lotions, powders, or perfumes/colognes, or deodorant  Do not shave 48 hours prior to surgery.  Men may shave face and neck.  Do not bring valuables to the hospital.  [facility] is not responsible for any belongings or valuables.  If you are a smoker, DO NOT Smoke 24 hours prior to surgery IF you wear a CPAP at night please bring your mask, tubing, and machine the morning of surgery   Remember that you must have someone to transport you home after your surgery, and remain with you for 24 hours if you are discharged the same day.  Contacts, glasses, hearing aids,  dentures or bridgework may not be worn into surgery.   Leave your suitcase in the car.  After surgery it may be brought to your room.  For patients admitted to the hospital, discharge time will be determined by your treatment team.  Patients discharged the day of surgery will not be allowed to drive home.   Special instructions:   [facility]- Preparing For Surgery  Before surgery, you can play an important role. Because skin is not sterile, your skin needs to be as free of germs as possible. You can reduce the number of germs on your skin by washing with CHG (chlorahexidine gluconate) Soap before surgery.  CHG is an antiseptic cleaner which kills germs and bonds with the skin to continue killing germs even after washing.    Oral Hygiene is also important to reduce your risk of infection.  Remember - BRUSH YOUR TEETH THE MORNING OF SURGERY WITH YOUR REGULAR TOOTHPASTE  Please do not use if you have an allergy to CHG or antibacterial soaps. If your skin becomes reddened/irritated stop using the CHG.  Do not shave (including legs and underarms) for at least 48 hours prior to first CHG shower. It is OK to shave your face.  Please follow these instructions carefully.   1. Shower the NIGHT BEFORE SURGERY and the MORNING OF SURGERY with CHG Soap.   2. If you chose to wash your hair, wash your hair first as usual with your normal shampoo.  3. After you shampoo, rinse your hair and body thoroughly to remove the shampoo.  4. Use CHG  as you would any other liquid soap. You can apply CHG directly to the skin and wash gently with a scrungie or a clean washcloth.   5. Apply the CHG Soap to your body ONLY FROM THE NECK DOWN.  Do not use on open wounds or open sores. Avoid contact with your eyes, ears, mouth and genitals (private parts). Wash Face and genitals (private parts)  with your normal soap.   6. Wash thoroughly, paying special attention to the area where your surgery will be  performed.  7. Thoroughly rinse your body with warm water from the neck down.  8. DO NOT shower/wash with your normal soap after using and rinsing off the CHG Soap.  9. Pat yourself dry with a CLEAN TOWEL.  10. Wear CLEAN PAJAMAS to bed the night before surgery, wear comfortable clothes the morning of surgery  11. Place CLEAN SHEETS on your bed the night of your first shower and DO NOT SLEEP WITH PETS.  Day of Surgery:  Do not apply any deodorants/lotions. Please shower the morning of surgery with the CHG soap  Please wear clean clothes to the hospital/surgery center.   Remember to brush your teeth WITH YOUR REGULAR TOOTHPASTE.  Please read over the following fact sheets that you were given.

## 2018-08-07 NOTE — Progress Notes (Signed)
PCP - Dr. Deland Pretty Cardiologist - denies  Chest x-ray - denies EKG - 08/07/2018 Stress Test - denies ECHO - denies Cardiac Cath - denies  Sleep Study - denies CPAP - N/A  Blood Thinner Instructions: N/A Aspirin Instructions: N/A  Anesthesia review: YES, hx AAA, hx difficult intubation  Coronavirus Screening  Have you experienced the following symptoms:  Cough yes/no: No Fever (>100.56F)  yes/no: No Runny nose yes/no: No Sore throat yes/no: No Difficulty breathing/shortness of breath  yes/no: No  Have you or a family member traveled in the last 14 days and where? yes/no: No  If the patient indicates "YES" to the above questions, their PAT will be rescheduled to limit the exposure to others and, the surgeon will be notified. THE PATIENT WILL NEED TO BE ASYMPTOMATIC FOR 14 DAYS.  If the patient is not experiencing any of these symptoms, the PAT nurse will instruct them to NOT bring anyone with them to their appointment since they may have these symptoms or traveled as well.   Please remind your patients and families that hospital visitation restrictions are in effect and the importance of the restrictions.   Patient denies shortness of breath, fever, cough and chest pain at PAT appointment  Patient verbalized understanding of instructions that were given to them at the PAT appointment. Patient was also instructed that they will need to review over the PAT instructions again at home before surgery.

## 2018-08-08 ENCOUNTER — Encounter (HOSPITAL_COMMUNITY): Payer: Self-pay

## 2018-08-08 NOTE — Progress Notes (Signed)
Anesthesia Chart Review:  Case: [id number] Date/Time: 08/14/18 0815   Procedure: RIGHT LUMBAR 2 - LUMBAR 3 LATERAL INTERBODY FUSION WITH INSTRUMENTATION AND ALLOGRAFT (Right )   Anesthesia type: General   Pre-op diagnosis: ADJACENT SEGMENT PATHOLOGY INVOLVING LUMBAR 2 - LUMBAR 3, THE LEVEL ABOVE THE PATIENT'S PREVIOUS FUSION   Location: [facility] OR ROOM 05 / [place] [facility]   Surgeon: Phylliss Bob, MD      DISCUSSION: Patient is a 68 year old male scheduled for the above procedure.   History includes former smoker (quit '82), HTN, HLD, pre-diabetes, MGUS, AAA (4 cm 08/22/17), chronic back pain, back surgeries (L4-5 PLIF 01/20/15, L3-4 left lateral fusion 01/26/16; L5-S1 ALIF 09/05/17, L5-S1 PLIF 09/06/17). History of positive OSA screening tool. For anesthesia history, he reported post-operative N/V and that his throat swelled after ETT was inserted in the 1980's or 1990's, but has not had any difficultly since. BMI is consistent with obesity.  Most recent anesthesia records: - 09/06/17: IV induction, ventilation without difficulty. Glidescope and 4. Grade 1 view. 7.5 ETT. ETT inserted through vocal cords under direct vision. - 09/05/17: IV induction, mask ventilation without difficulty. Video-laryngoscopy and Rigid stylet (elective for difficult intubation history). Glidescope and 4. Grade 1 view. 7.5 ETT. ETT inserted through vocal cords under direct vision.  Reoperative labs and EKG appear stable, although UA showed moderate leukocytes, many bacteria, but negative nitrites. UA result was left with Butch Penny at Dr. Laurena Bering office. Defer treatment recommendations, if any, to surgeon.  Presurgical COVID-19 test is scheduled for 08/10/18. If results negative and otherwise no acute changes I would anticipate that he can proceed as planned from an anesthesia standpoint.   VS: BP (!) 161/88   Pulse 80   Temp 36.5 C (Oral)   Resp 18   Ht 5\' 7"  (1.702 m)   Wt 110.8 kg   SpO2 100%   BMI 38.25 kg/m     PROVIDERS: Deland Pretty, MD is PCP Servando Snare, MD is vascular surgeon. Last visit with Curt Jews, MD on 09/03/17. Dr. Donzetta Matters recommended 1 year follow-up for AAA, measured 4 cm 08/2017. Magrinat, Sarajane Jews, MD is HEM. Last visit 04/09/18. Observation for MGUS.   LABS: Labs reviewed: Acceptable for surgery. CBC results overall stable since 03/2018. (all labs ordered are listed, but only abnormal results are displayed)  Labs Reviewed  CBC WITH DIFFERENTIAL/PLATELET - Abnormal; Notable for the following components:      Result Value   WBC 2.8 (*)    RBC 3.65 (*)    Hemoglobin 11.5 (*)    HCT 35.1 (*)    Platelets 149 (*)    Lymphs Abs 0.6 (*)    All other components within normal limits  COMPREHENSIVE METABOLIC PANEL - Abnormal; Notable for the following components:   Glucose, Bld 149 (*)    BUN 33 (*)    All other components within normal limits  URINALYSIS, ROUTINE W REFLEX MICROSCOPIC - Abnormal; Notable for the following components:   Leukocytes,Ua MODERATE (*)    Bacteria, UA MANY (*)    All other components within normal limits  SURGICAL PCR SCREEN  APTT  PROTIME-INR  TYPE AND SCREEN   CBC Latest Ref Rng & Units 08/07/2018 04/02/2018 03/19/2018  WBC 4.0 - 10.5 K/uL 2.8(L) 3.5(L) 3.2(L)  Hemoglobin 13.0 - 17.0 g/dL 11.5(L) 11.6(L) 11.6(L)  Hematocrit 39.0 - 52.0 % 35.1(L) 34.7(L) 34.3(L)  Platelets 150 - 400 K/uL 149(L) 142(L) 155    IMAGES: Bone Scan 06/21/18: IMPRESSION: LEFT knee prosthesis without scintigraphic evidence  of aseptic loosening or infection. Mild degenerative type uptake at RIGHT knee.  MRI L-spine 01/15/18: IMPRESSION: 1. Moderate impingement at L2-3 likely from adjacent segment disease as described above. 2. Fusion at K5-G2-B6-L8 without complicating feature. 3. Degenerative retrolisthesis at L1-2 and L2-3 is noted above. 6 mm of fused anterolisthesis at L4-5.  CXR 08/28/17: IMPRESSION: Mild chronic bronchitic-smoking related changes, stable.  No acute cardiopulmonary abnormality. Thoracic aortic atherosclerosis.   EKG: 08/07/18:  Normal sinus rhythm Left axis deviation Abnormal ECG No significant change since last tracing Confirmed by Glori Bickers [phone]) on 08/08/2018 12:02:17 AM   CV: AAA U/S 08/22/17: Final Interpretation: Abdominal Aorta: There is evidence of abnormal dilitation of the Proximal Abdominal aorta. The largest aortic measurement is 4.0 cm. Previous diameter measurement was 4.0 cm. Technically limited by body habitus and bowel gas.   Past Medical History:  Diagnosis Date  . AAA (abdominal aortic aneurysm) (HCC)    4.0 cm AAA 08/22/17 U/S  . Allergy    Allergic Rhinitis  . Anticipated difficulty with intubation   . Arthritis    spinal steniosis  . At risk for difficult intubation    no trouble intubating as far as pt knows- swelling afterward -above note previous  . Benign prostate hyperplasia    with lower urinary tract symptoms  . Carpal tunnel syndrome on right   . Cataracts, both eyes   . Chronic back pain    caused by spinal stenosis  . 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 Elevated PSA   . GERD (gastroesophageal reflux disease)   . Hyperlipidemia   . Hypertension    Essential.    . MGUS (monoclonal gammopathy of unknown significance)   . Microhematuria   . OA (osteoarthritis) of knee   . Pneumonia    Hx -when very young  . PONV (postoperative nausea and vomiting)   . Pre-diabetes   . Seasonal allergies   . Spinal stenosis    of lumbosacral region.  Causes chronic back pain.  [name] Upper abdominal pain     Past Surgical History:  Procedure Laterality Date  . ABDOMINAL EXPOSURE N/A 09/05/2017   Procedure: ABDOMINAL EXPOSURE;  Surgeon: Rosetta Posner, MD;  Location[facility];  Service: Vascular;  Laterality: N/A;  . ANTERIOR LAT LUMBAR FUSION Left 01/26/2016   Procedure: LEFT SIDED  LATERAL INTERBODY FUSION, LUMBAR 3-4 WITH INSTRUMENTATION AND ALLOGRAFT;  Surgeon: Phylliss Bob, MD;  Location: [place];  Service: Orthopedics;  Laterality: Left;  LEFT SIDED LATERAL INTERBODY FUSION, LUMBAR 3-4 WITH INSTRUMENTATION AND ALLOGRAFT  . ANTERIOR LATERAL LUMBAR FUSION WITH PERCUTANEOUS SCREW 1 LEVEL N/A 09/05/2017   Procedure: LUMBAR 5 - SACRUM 1 ANTERIOR LUMBAR INTERBODY FUSION WITH INSTRUMENTATION AND ALLOGRAFT;  Surgeon: Phylliss Bob, MD;  Location: [place];  Service: Orthopedics;  Laterality: N/A;  . BACK SURGERY  01/2015   dr. Lynann Bologna. [phone].  [name] CARPAL TUNNEL RELEASE     right   . CATARACT EXTRACTION, BILATERAL    . COLONOSCOPY    . COLONOSCOPY  2010   polyp  . ELBOW SURGERY     right ulnar release  . ELBOW SURGERY     LEFT CTR  . JOINT REPLACEMENT     left- Sept 2016  . KNEE ARTHROSCOPY  11/23/2010   Procedure: ARTHROSCOPY KNEE;  Surgeon: Kerin Salen;  Location: [place];  Service: Orthopedics;  Laterality: Left;  arthroscopy left knee  . KNEE ARTHROSCOPY     left x 2  . Knee scar tissue removal Left 2018  . LATERAL FUSION LUMBAR SPINE  01./17/2018  . polypectomy  2010  . SHOULDER ARTHROSCOPY Left    x2  . TONSILLECTOMY    . TOTAL KNEE ARTHROPLASTY Left 09/21/2014   Procedure: TOTAL KNEE ARTHROPLASTY;  Surgeon: Frederik Pear, MD;  Location: [place];  Service: Orthopedics;  Laterality: Left;  . ULNAR NERVE TRANSPOSITION Left 04/16/2017   Procedure: LEFT ULNAR NEUROPLASTY AT THE WRIST AND TRANSPOSITION AT THE ELBOW;  Surgeon: Milly Jakob, MD;  Location: [place];  Service: Orthopedics;  Laterality: Left;  . WISDOM TOOTH EXTRACTION      MEDICATIONS: . diclofenac (VOLTAREN) 75 MG EC tablet  . docusate sodium (COLACE) 100 MG capsule  . gabapentin (NEURONTIN) 600 MG tablet  . HYDROcodone-acetaminophen (NORCO/VICODIN) 5-325 MG tablet  . lisinopril-hydrochlorothiazide (PRINZIDE,ZESTORETIC) 20-25 MG tablet  . MAGNESIUM CITRATE PO   . methocarbamol (ROBAXIN) 750 MG tablet  . Nutritional Supplements (JUICE PLUS FIBRE PO)  . pantoprazole (PROTONIX) 40 MG tablet  . rosuvastatin (CRESTOR) 5 MG tablet  . Tamsulosin HCl (FLOMAX) 0.4 MG CAPS  . Tetrahydrozoline HCl (VISINE OP)  . tiZANidine (ZANAFLEX) 4 MG tablet   No current facility-administered medications for this encounter.     Myra Gianotti, PA-C Surgical Short Stay/Anesthesiology [facility] Phone [phone] [facility] Phone [phone] 08/08/2018 5:28 PM

## 2018-08-08 NOTE — Anesthesia Preprocedure Evaluation (Addendum)
Anesthesia Evaluation  Patient identified by MRN, date of birth, ID band Patient awake    Reviewed: Allergy & Precautions, NPO status , Patient's Chart, lab work & pertinent test results  Airway Mallampati: I  TM Distance: >3 FB Neck ROM: Full    Dental no notable dental hx.    Pulmonary former smoker,    Pulmonary exam normal breath sounds clear to auscultation       Cardiovascular hypertension, Pt. on medications Normal cardiovascular exam Rhythm:Regular Rate:Normal  ECG: rate 73. Normal sinus rhythm Left axis deviation   Neuro/Psych  Neuromuscular disease negative psych ROS   GI/Hepatic GERD  Medicated and Controlled,(+)     substance abuse  ,   Endo/Other  negative endocrine ROS  Renal/GU negative Renal ROS     Musculoskeletal  (+) Arthritis , narcotic dependentChronic back pain caused by spinal stenosis    Abdominal (+) + obese,   Peds  Hematology  (+) anemia , HLD   Anesthesia Other Findings ADJACENT SEGMENT PATHOLOGY INVOLVING LUMBAR 2 - LUMBAR 3, THE LEVEL ABOVE THE PATIENT'S PREVIOUS FUSION  Reproductive/Obstetrics                          Anesthesia Physical Anesthesia Plan  ASA: III  Anesthesia Plan: General   Post-op Pain Management:    Induction: Intravenous  PONV Risk Score and Plan: 2 and Midazolam, Ondansetron and Treatment may vary due to age or medical condition  Airway Management Planned: Oral ETT  Additional Equipment:   Intra-op Plan:   Post-operative Plan: Extubation in OR  Informed Consent: I have reviewed the patients History and Physical, chart, labs and discussed the procedure including the risks, benefits and alternatives for the proposed anesthesia with the patient or authorized representative who has indicated his/her understanding and acceptance.     Dental advisory given  Plan Discussed with: CRNA  Anesthesia Plan Comments: (Reviewed PAT  note written 08/08/2018 by Myra Gianotti, PA-C. Ketamine )     Anesthesia Quick Evaluation

## 2018-08-10 ENCOUNTER — Other Ambulatory Visit (HOSPITAL_COMMUNITY)
Admission: RE | Admit: 2018-08-10 | Discharge: 2018-08-10 | Disposition: A | Discharge status: Home / Self Care | Payer: Medicare Other | Source: Ambulatory Visit | Attending: Orthopedic Surgery | Admitting: Orthopedic Surgery

## 2018-08-10 DIAGNOSIS — M5136 Other intervertebral disc degeneration, lumbar region: Secondary | ICD-10-CM | POA: Diagnosis not present

## 2018-08-10 DIAGNOSIS — G8929 Other chronic pain: Secondary | ICD-10-CM | POA: Diagnosis not present

## 2018-08-10 DIAGNOSIS — Z20828 Contact with and (suspected) exposure to other viral communicable diseases: Secondary | ICD-10-CM | POA: Diagnosis not present

## 2018-08-10 DIAGNOSIS — Z01818 Encounter for other preprocedural examination: Secondary | ICD-10-CM | POA: Diagnosis not present

## 2018-08-10 DIAGNOSIS — M4316 Spondylolisthesis, lumbar region: Secondary | ICD-10-CM | POA: Diagnosis not present

## 2018-08-10 DIAGNOSIS — Z981 Arthrodesis status: Secondary | ICD-10-CM | POA: Diagnosis not present

## 2018-08-10 LAB — SARS CORONAVIRUS 2 (TAT 6-24 HRS): SARS Coronavirus 2: NEGATIVE

## 2018-08-13 MED ORDER — VANCOMYCIN HCL 10 G IV SOLR
1500.0000 mg | INTRAVENOUS | Status: AC
  Administered 2018-08-14: 1500 mg via INTRAVENOUS
  Filled 2018-08-13: qty 1500

## 2018-08-14 ENCOUNTER — Encounter (HOSPITAL_COMMUNITY)
Admission: RE | Disposition: A | Discharge status: Home / Self Care | Payer: Self-pay | Source: Home / Self Care | Attending: Orthopedic Surgery

## 2018-08-14 ENCOUNTER — Other Ambulatory Visit: Payer: Self-pay

## 2018-08-14 ENCOUNTER — Encounter (HOSPITAL_COMMUNITY): Payer: Self-pay | Admitting: General Practice

## 2018-08-14 ENCOUNTER — Inpatient Hospital Stay (HOSPITAL_COMMUNITY)
Admission: RE | Admit: 2018-08-14 | Discharge: 2018-08-16 | DRG: 455 | Disposition: A | Discharge status: Home / Self Care | Payer: Medicare Other | Attending: Orthopedic Surgery | Admitting: Orthopedic Surgery

## 2018-08-14 ENCOUNTER — Inpatient Hospital Stay (HOSPITAL_COMMUNITY): Payer: Medicare Other | Admitting: Anesthesiology

## 2018-08-14 ENCOUNTER — Inpatient Hospital Stay (HOSPITAL_COMMUNITY): Payer: Medicare Other | Admitting: Physician Assistant

## 2018-08-14 ENCOUNTER — Inpatient Hospital Stay (HOSPITAL_COMMUNITY): Payer: Medicare Other

## 2018-08-14 DIAGNOSIS — Z888 Allergy status to other drugs, medicaments and biological substances status: Secondary | ICD-10-CM | POA: Diagnosis not present

## 2018-08-14 DIAGNOSIS — Z880 Allergy status to penicillin: Secondary | ICD-10-CM

## 2018-08-14 DIAGNOSIS — Z91013 Allergy to seafood: Secondary | ICD-10-CM

## 2018-08-14 DIAGNOSIS — M960 Pseudarthrosis after fusion or arthrodesis: Secondary | ICD-10-CM | POA: Diagnosis not present

## 2018-08-14 DIAGNOSIS — M48061 Spinal stenosis, lumbar region without neurogenic claudication: Secondary | ICD-10-CM | POA: Diagnosis present

## 2018-08-14 DIAGNOSIS — R7303 Prediabetes: Secondary | ICD-10-CM | POA: Diagnosis present

## 2018-08-14 DIAGNOSIS — M5416 Radiculopathy, lumbar region: Secondary | ICD-10-CM | POA: Diagnosis present

## 2018-08-14 DIAGNOSIS — M5116 Intervertebral disc disorders with radiculopathy, lumbar region: Secondary | ICD-10-CM | POA: Diagnosis not present

## 2018-08-14 DIAGNOSIS — I10 Essential (primary) hypertension: Secondary | ICD-10-CM | POA: Diagnosis present

## 2018-08-14 DIAGNOSIS — Z79899 Other long term (current) drug therapy: Secondary | ICD-10-CM | POA: Diagnosis not present

## 2018-08-14 DIAGNOSIS — Z981 Arthrodesis status: Secondary | ICD-10-CM

## 2018-08-14 DIAGNOSIS — Z79891 Long term (current) use of opiate analgesic: Secondary | ICD-10-CM

## 2018-08-14 DIAGNOSIS — I714 Abdominal aortic aneurysm, without rupture: Secondary | ICD-10-CM | POA: Diagnosis present

## 2018-08-14 DIAGNOSIS — M4316 Spondylolisthesis, lumbar region: Secondary | ICD-10-CM | POA: Diagnosis not present

## 2018-08-14 DIAGNOSIS — Z87891 Personal history of nicotine dependence: Secondary | ICD-10-CM | POA: Diagnosis not present

## 2018-08-14 DIAGNOSIS — M5410 Radiculopathy, site unspecified: Secondary | ICD-10-CM | POA: Diagnosis present

## 2018-08-14 DIAGNOSIS — M5136 Other intervertebral disc degeneration, lumbar region: Secondary | ICD-10-CM | POA: Diagnosis not present

## 2018-08-14 DIAGNOSIS — M5126 Other intervertebral disc displacement, lumbar region: Secondary | ICD-10-CM | POA: Diagnosis not present

## 2018-08-14 DIAGNOSIS — Z419 Encounter for procedure for purposes other than remedying health state, unspecified: Secondary | ICD-10-CM

## 2018-08-14 DIAGNOSIS — M48062 Spinal stenosis, lumbar region with neurogenic claudication: Secondary | ICD-10-CM | POA: Diagnosis not present

## 2018-08-14 HISTORY — PX: ANTERIOR LAT LUMBAR FUSION: SHX1168

## 2018-08-14 SURGERY — ANTERIOR LATERAL LUMBAR FUSION 1 LEVEL
Anesthesia: General | Site: Spine Lumbar | Laterality: Right

## 2018-08-14 MED ORDER — PROPOFOL 10 MG/ML IV BOLUS
INTRAVENOUS | Status: AC
  Filled 2018-08-14: qty 20

## 2018-08-14 MED ORDER — BUPIVACAINE-EPINEPHRINE (PF) 0.25% -1:200000 IJ SOLN
INTRAMUSCULAR | Status: AC
  Filled 2018-08-14: qty 30

## 2018-08-14 MED ORDER — ROSUVASTATIN CALCIUM 5 MG PO TABS
5.0000 mg | ORAL_TABLET | Freq: Every day | ORAL | Status: DC
  Administered 2018-08-15: 5 mg via ORAL
  Filled 2018-08-14: qty 1

## 2018-08-14 MED ORDER — THROMBIN 20000 UNITS EX SOLR
CUTANEOUS | Status: DC | PRN
  Administered 2018-08-14: 20 mL via TOPICAL

## 2018-08-14 MED ORDER — LISINOPRIL-HYDROCHLOROTHIAZIDE 20-25 MG PO TABS: 1.0000 | ORAL_TABLET | Freq: Every day | ORAL | Status: DC

## 2018-08-14 MED ORDER — ACETAMINOPHEN 500 MG PO TABS
500.0000 mg | ORAL_TABLET | Freq: Once | ORAL | Status: AC
  Administered 2018-08-14: 500 mg via ORAL

## 2018-08-14 MED ORDER — MORPHINE SULFATE (PF) 2 MG/ML IV SOLN
INTRAVENOUS | Status: AC
  Filled 2018-08-14: qty 1

## 2018-08-14 MED ORDER — PHENYLEPHRINE 40 MCG/ML (10ML) SYRINGE FOR IV PUSH (FOR BLOOD PRESSURE SUPPORT)
PREFILLED_SYRINGE | INTRAVENOUS | Status: AC
  Filled 2018-08-14: qty 10

## 2018-08-14 MED ORDER — LIDOCAINE 2% (20 MG/ML) 5 ML SYRINGE
INTRAMUSCULAR | Status: AC
  Filled 2018-08-14: qty 5

## 2018-08-14 MED ORDER — FENTANYL CITRATE (PF) 100 MCG/2ML IJ SOLN
INTRAMUSCULAR | Status: AC
  Filled 2018-08-14: qty 2

## 2018-08-14 MED ORDER — SODIUM CHLORIDE 0.9 % IV SOLN
INTRAVENOUS | Status: DC | PRN
  Administered 2018-08-14: 25 ug/min via INTRAVENOUS

## 2018-08-14 MED ORDER — FENTANYL CITRATE (PF) 250 MCG/5ML IJ SOLN
INTRAMUSCULAR | Status: AC
  Filled 2018-08-14: qty 5

## 2018-08-14 MED ORDER — ONDANSETRON HCL 4 MG/2ML IJ SOLN
INTRAMUSCULAR | Status: AC
  Filled 2018-08-14: qty 2

## 2018-08-14 MED ORDER — TETRAHYDROZOLINE HCL 0.05 % OP SOLN
1.0000 [drp] | Freq: Every day | OPHTHALMIC | Status: DC | PRN
  Filled 2018-08-14: qty 15

## 2018-08-14 MED ORDER — LIDOCAINE HCL (CARDIAC) PF 100 MG/5ML IV SOSY
PREFILLED_SYRINGE | INTRAVENOUS | Status: DC | PRN
  Administered 2018-08-14: 100 mg via INTRAVENOUS

## 2018-08-14 MED ORDER — PROPOFOL 10 MG/ML IV BOLUS
INTRAVENOUS | Status: DC | PRN
  Administered 2018-08-14: 200 mg via INTRAVENOUS

## 2018-08-14 MED ORDER — SENNOSIDES-DOCUSATE SODIUM 8.6-50 MG PO TABS
1.0000 | ORAL_TABLET | Freq: Every evening | ORAL | Status: DC | PRN
  Filled 2018-08-14: qty 1

## 2018-08-14 MED ORDER — LISINOPRIL 20 MG PO TABS
20.0000 mg | ORAL_TABLET | Freq: Every day | ORAL | Status: DC
  Administered 2018-08-14 – 2018-08-15 (×2): 20 mg via ORAL
  Filled 2018-08-14 (×2): qty 1

## 2018-08-14 MED ORDER — VANCOMYCIN HCL IN DEXTROSE 1-5 GM/200ML-% IV SOLN
1000.0000 mg | Freq: Once | INTRAVENOUS | Status: AC
  Administered 2018-08-14: 19:00:00 1000 mg via INTRAVENOUS
  Filled 2018-08-14: qty 200

## 2018-08-14 MED ORDER — HEMOSTATIC AGENTS (NO CHARGE) OPTIME
TOPICAL | Status: DC | PRN
  Administered 2018-08-14: 1 via TOPICAL

## 2018-08-14 MED ORDER — EPHEDRINE 5 MG/ML INJ
INTRAVENOUS | Status: AC
  Filled 2018-08-14: qty 20

## 2018-08-14 MED ORDER — BISACODYL 5 MG PO TBEC: 5.0000 mg | DELAYED_RELEASE_TABLET | Freq: Every day | ORAL | Status: DC | PRN

## 2018-08-14 MED ORDER — ONDANSETRON HCL 4 MG PO TABS: 4.0000 mg | ORAL_TABLET | Freq: Four times a day (QID) | ORAL | Status: DC | PRN

## 2018-08-14 MED ORDER — GABAPENTIN 600 MG PO TABS
600.0000 mg | ORAL_TABLET | Freq: Three times a day (TID) | ORAL | Status: DC
  Administered 2018-08-14 – 2018-08-15 (×4): 600 mg via ORAL
  Filled 2018-08-14 (×4): qty 1

## 2018-08-14 MED ORDER — ONDANSETRON HCL 4 MG/2ML IJ SOLN
4.0000 mg | Freq: Four times a day (QID) | INTRAMUSCULAR | Status: DC | PRN
  Administered 2018-08-14: 4 mg via INTRAVENOUS
  Filled 2018-08-14: qty 2

## 2018-08-14 MED ORDER — MORPHINE SULFATE (PF) 2 MG/ML IV SOLN
1.0000 mg | INTRAVENOUS | Status: DC | PRN
  Administered 2018-08-14 – 2018-08-15 (×2): 2 mg via INTRAVENOUS
  Filled 2018-08-14 (×2): qty 1

## 2018-08-14 MED ORDER — HYDROXYZINE HCL 50 MG/ML IM SOLN
50.0000 mg | Freq: Four times a day (QID) | INTRAMUSCULAR | Status: DC | PRN
  Administered 2018-08-14: 50 mg via INTRAMUSCULAR
  Filled 2018-08-14: qty 1

## 2018-08-14 MED ORDER — PROPOFOL 500 MG/50ML IV EMUL
INTRAVENOUS | Status: DC | PRN
  Administered 2018-08-14: 75 ug/kg/min via INTRAVENOUS

## 2018-08-14 MED ORDER — MIDAZOLAM HCL 5 MG/5ML IJ SOLN
INTRAMUSCULAR | Status: DC | PRN
  Administered 2018-08-14: 1 mg via INTRAVENOUS

## 2018-08-14 MED ORDER — PHENYLEPHRINE HCL (PRESSORS) 10 MG/ML IV SOLN
INTRAVENOUS | Status: DC | PRN
  Administered 2018-08-14: 80 ug via INTRAVENOUS
  Administered 2018-08-14 (×2): 120 ug via INTRAVENOUS

## 2018-08-14 MED ORDER — POTASSIUM CHLORIDE IN NACL 20-0.9 MEQ/L-% IV SOLN: INTRAVENOUS | Status: DC

## 2018-08-14 MED ORDER — OXYCODONE-ACETAMINOPHEN 5-325 MG PO TABS
1.0000 | ORAL_TABLET | ORAL | Status: DC | PRN
  Administered 2018-08-14 – 2018-08-16 (×6): 2 via ORAL
  Filled 2018-08-14 (×6): qty 2

## 2018-08-14 MED ORDER — FLEET ENEMA 7-19 GM/118ML RE ENEM: 1.0000 | ENEMA | Freq: Once | RECTAL | Status: DC | PRN

## 2018-08-14 MED ORDER — ONDANSETRON HCL 4 MG/2ML IJ SOLN: 4.0000 mg | Freq: Once | INTRAMUSCULAR | Status: DC | PRN

## 2018-08-14 MED ORDER — ONDANSETRON HCL 4 MG/2ML IJ SOLN
INTRAMUSCULAR | Status: DC | PRN
  Administered 2018-08-14: 4 mg via INTRAVENOUS

## 2018-08-14 MED ORDER — BUPIVACAINE LIPOSOME 1.3 % IJ SUSP
INTRAMUSCULAR | Status: DC | PRN
  Administered 2018-08-14: 20 mL

## 2018-08-14 MED ORDER — HYDROCHLOROTHIAZIDE 25 MG PO TABS
25.0000 mg | ORAL_TABLET | Freq: Every day | ORAL | Status: DC
  Administered 2018-08-14 – 2018-08-15 (×2): 25 mg via ORAL
  Filled 2018-08-14 (×2): qty 1

## 2018-08-14 MED ORDER — 0.9 % SODIUM CHLORIDE (POUR BTL) OPTIME
TOPICAL | Status: DC | PRN
  Administered 2018-08-14 (×2): 1000 mL

## 2018-08-14 MED ORDER — PANTOPRAZOLE SODIUM 40 MG PO TBEC
40.0000 mg | DELAYED_RELEASE_TABLET | Freq: Every day | ORAL | Status: DC
  Administered 2018-08-15: 40 mg via ORAL
  Filled 2018-08-14: qty 1

## 2018-08-14 MED ORDER — TAMSULOSIN HCL 0.4 MG PO CAPS
0.4000 mg | ORAL_CAPSULE | Freq: Every day | ORAL | Status: DC
  Administered 2018-08-14 – 2018-08-15 (×2): 0.4 mg via ORAL
  Filled 2018-08-14 (×2): qty 1

## 2018-08-14 MED ORDER — PHENOL 1.4 % MT LIQD: 1.0000 | OROMUCOSAL | Status: DC | PRN

## 2018-08-14 MED ORDER — DOCUSATE SODIUM 100 MG PO CAPS
100.0000 mg | ORAL_CAPSULE | Freq: Two times a day (BID) | ORAL | Status: DC
  Administered 2018-08-14 – 2018-08-15 (×4): 100 mg via ORAL
  Filled 2018-08-14 (×2): qty 1

## 2018-08-14 MED ORDER — FENTANYL CITRATE (PF) 100 MCG/2ML IJ SOLN
25.0000 ug | INTRAMUSCULAR | Status: DC | PRN
  Administered 2018-08-14: 25 ug via INTRAVENOUS

## 2018-08-14 MED ORDER — LACTATED RINGERS IV SOLN
INTRAVENOUS | Status: DC
  Administered 2018-08-14 (×2): via INTRAVENOUS

## 2018-08-14 MED ORDER — DIAZEPAM 5 MG PO TABS
5.0000 mg | ORAL_TABLET | Freq: Four times a day (QID) | ORAL | Status: DC | PRN
  Administered 2018-08-14 – 2018-08-15 (×3): 5 mg via ORAL
  Filled 2018-08-14 (×3): qty 1

## 2018-08-14 MED ORDER — MENTHOL 3 MG MT LOZG: 1.0000 | LOZENGE | OROMUCOSAL | Status: DC | PRN

## 2018-08-14 MED ORDER — FENTANYL CITRATE (PF) 100 MCG/2ML IJ SOLN
INTRAMUSCULAR | Status: DC | PRN
  Administered 2018-08-14: 100 ug via INTRAVENOUS
  Administered 2018-08-14 (×3): 50 ug via INTRAVENOUS

## 2018-08-14 MED ORDER — ACETAMINOPHEN 325 MG PO TABS: 650.0000 mg | ORAL_TABLET | ORAL | Status: DC | PRN

## 2018-08-14 MED ORDER — THROMBIN 5000 UNITS EX SOLR
CUTANEOUS | Status: AC
  Filled 2018-08-14: qty 5000

## 2018-08-14 MED ORDER — SUCCINYLCHOLINE CHLORIDE 200 MG/10ML IV SOSY
PREFILLED_SYRINGE | INTRAVENOUS | Status: AC
  Filled 2018-08-14: qty 10

## 2018-08-14 MED ORDER — POVIDONE-IODINE 7.5 % EX SOLN
Freq: Once | CUTANEOUS | Status: DC
  Filled 2018-08-14: qty 118

## 2018-08-14 MED ORDER — ROCURONIUM BROMIDE 10 MG/ML (PF) SYRINGE
PREFILLED_SYRINGE | INTRAVENOUS | Status: AC
  Filled 2018-08-14: qty 10

## 2018-08-14 MED ORDER — BUPIVACAINE LIPOSOME 1.3 % IJ SUSP
20.0000 mL | Freq: Once | INTRAMUSCULAR | Status: DC
  Filled 2018-08-14: qty 20

## 2018-08-14 MED ORDER — SODIUM CHLORIDE 0.9 % IV SOLN: 250.0000 mL | INTRAVENOUS | Status: DC

## 2018-08-14 MED ORDER — ALUM & MAG HYDROXIDE-SIMETH 200-200-20 MG/5ML PO SUSP: 30.0000 mL | Freq: Four times a day (QID) | ORAL | Status: DC | PRN

## 2018-08-14 MED ORDER — SUCCINYLCHOLINE CHLORIDE 20 MG/ML IJ SOLN
INTRAMUSCULAR | Status: DC | PRN
  Administered 2018-08-14: 100 mg via INTRAVENOUS

## 2018-08-14 MED ORDER — ZOLPIDEM TARTRATE 5 MG PO TABS: 5.0000 mg | ORAL_TABLET | Freq: Every evening | ORAL | Status: DC | PRN

## 2018-08-14 MED ORDER — PROPOFOL 1000 MG/100ML IV EMUL
INTRAVENOUS | Status: AC
  Filled 2018-08-14: qty 200

## 2018-08-14 MED ORDER — ACETAMINOPHEN 650 MG RE SUPP: 650.0000 mg | RECTAL | Status: DC | PRN

## 2018-08-14 MED ORDER — EPHEDRINE SULFATE 50 MG/ML IJ SOLN
INTRAMUSCULAR | Status: DC | PRN
  Administered 2018-08-14 (×2): 10 mg via INTRAVENOUS

## 2018-08-14 MED ORDER — SODIUM CHLORIDE 0.9% FLUSH
3.0000 mL | Freq: Two times a day (BID) | INTRAVENOUS | Status: DC
  Administered 2018-08-15: 3 mL via INTRAVENOUS

## 2018-08-14 MED ORDER — MIDAZOLAM HCL 2 MG/2ML IJ SOLN
INTRAMUSCULAR | Status: AC
  Filled 2018-08-14: qty 2

## 2018-08-14 MED ORDER — ACETAMINOPHEN 500 MG PO TABS
1000.0000 mg | ORAL_TABLET | Freq: Once | ORAL | Status: DC
  Filled 2018-08-14: qty 2

## 2018-08-14 MED ORDER — THROMBIN 20000 UNITS EX SOLR
CUTANEOUS | Status: AC
  Filled 2018-08-14: qty 20000

## 2018-08-14 MED ORDER — DOCUSATE SODIUM 100 MG PO CAPS
100.0000 mg | ORAL_CAPSULE | Freq: Every day | ORAL | Status: DC
  Filled 2018-08-14 (×2): qty 1

## 2018-08-14 MED ORDER — BUPIVACAINE-EPINEPHRINE 0.25% -1:200000 IJ SOLN
INTRAMUSCULAR | Status: DC | PRN
  Administered 2018-08-14: 4 mL

## 2018-08-14 MED ORDER — SODIUM CHLORIDE 0.9% FLUSH: 3.0000 mL | INTRAVENOUS | Status: DC | PRN

## 2018-08-14 SURGICAL SUPPLY — 77 items
BENZOIN TINCTURE PRP APPL 2/3 (GAUZE/BANDAGES/DRESSINGS) ×3 IMPLANT
BLADE CLIPPER SURG (BLADE) ×3 IMPLANT
BLADE SURG 10 STRL SS (BLADE) ×3 IMPLANT
BOLT DECADE 5.5X40 (Bolt) ×2 IMPLANT
BOLT DECADE 5.5X40MM (Bolt) ×1 IMPLANT
BOLT SPNL LRG 45X5.5XPLAT NS (Screw) ×1 IMPLANT
BONE VIVIGEN FORMABLE 10CC (Bone Implant) ×3 IMPLANT
CLOSURE STERI-STRIP 1/2X4 (GAUZE/BANDAGES/DRESSINGS) ×1
CLOSURE WOUND 1/2 X4 (GAUZE/BANDAGES/DRESSINGS)
CLSR STERI-STRIP ANTIMIC 1/2X4 (GAUZE/BANDAGES/DRESSINGS) ×2 IMPLANT
COVER BACK TABLE 80X110 HD (DRAPES) ×3 IMPLANT
COVER SURGICAL LIGHT HANDLE (MISCELLANEOUS) ×3 IMPLANT
COVER WAND RF STERILE (DRAPES) ×3 IMPLANT
DRAPE C-ARM 42X72 X-RAY (DRAPES) ×3 IMPLANT
DRAPE C-ARMOR (DRAPES) ×3 IMPLANT
DRAPE POUCH INSTRU U-SHP 10X18 (DRAPES) ×3 IMPLANT
DRAPE SURG 17X23 STRL (DRAPES) ×12 IMPLANT
DURAPREP 26ML APPLICATOR (WOUND CARE) ×3 IMPLANT
ELECT BLADE 6.5 EXT (BLADE) ×3 IMPLANT
ELECT CAUTERY BLADE 6.4 (BLADE) ×3 IMPLANT
ELECT REM PT RETURN 9FT ADLT (ELECTROSURGICAL) ×3
ELECTRODE REM PT RTRN 9FT ADLT (ELECTROSURGICAL) ×1 IMPLANT
GAUZE 4X4 16PLY RFD (DISPOSABLE) ×3 IMPLANT
GAUZE SPONGE 4X4 12PLY STRL LF (GAUZE/BANDAGES/DRESSINGS) ×3 IMPLANT
GLOVE BIO SURGEON STRL SZ 6.5 (GLOVE) ×2 IMPLANT
GLOVE BIO SURGEON STRL SZ7 (GLOVE) ×6 IMPLANT
GLOVE BIO SURGEON STRL SZ8 (GLOVE) ×3 IMPLANT
GLOVE BIO SURGEONS STRL SZ 6.5 (GLOVE) ×1
GLOVE BIOGEL PI IND STRL 6.5 (GLOVE) ×1 IMPLANT
GLOVE BIOGEL PI IND STRL 7.0 (GLOVE) ×1 IMPLANT
GLOVE BIOGEL PI IND STRL 8 (GLOVE) ×1 IMPLANT
GLOVE BIOGEL PI INDICATOR 6.5 (GLOVE) ×2
GLOVE BIOGEL PI INDICATOR 7.0 (GLOVE) ×2
GLOVE BIOGEL PI INDICATOR 8 (GLOVE) ×2
GOWN STRL REUS W/ TWL LRG LVL3 (GOWN DISPOSABLE) ×3 IMPLANT
GOWN STRL REUS W/ TWL XL LVL3 (GOWN DISPOSABLE) ×2 IMPLANT
GOWN STRL REUS W/TWL LRG LVL3 (GOWN DISPOSABLE) ×6
GOWN STRL REUS W/TWL XL LVL3 (GOWN DISPOSABLE) ×4
IV CATH 14GX2 1/4 (CATHETERS) ×3 IMPLANT
KIT BASIN OR (CUSTOM PROCEDURE TRAY) ×3 IMPLANT
KIT DILATOR XLIF 5 (KITS) ×2 IMPLANT
KIT SURGICAL ACCESS MAXCESS 4 (KITS) ×3 IMPLANT
KIT TURNOVER KIT B (KITS) ×3 IMPLANT
KIT XLIF (KITS) ×1
MARKER SKIN DUAL TIP RULER LAB (MISCELLANEOUS) ×3 IMPLANT
MODULE EMG NEEDLE SSEP NVM5 (NEEDLE) ×3 IMPLANT
MODULE NVM5 NEXT GEN EMG (NEEDLE) ×3 IMPLANT
NEEDLE HYPO 25GX1X1/2 BEV (NEEDLE) ×3 IMPLANT
NEEDLE SPNL 18GX3.5 QUINCKE PK (NEEDLE) ×3 IMPLANT
NS IRRIG 1000ML POUR BTL (IV SOLUTION) ×6 IMPLANT
PACK LAMINECTOMY ORTHO (CUSTOM PROCEDURE TRAY) ×3 IMPLANT
PACK UNIVERSAL I (CUSTOM PROCEDURE TRAY) ×3 IMPLANT
PAD ARMBOARD 7.5X6 YLW CONV (MISCELLANEOUS) ×6 IMPLANT
PLATE 2H 10MM (Plate) ×3 IMPLANT
SCREW 45MM (Screw) ×2 IMPLANT
SPACER LAT XL PLUS 10X22X55 (Spacer) ×3 IMPLANT
SPONGE INTESTINAL PEANUT (DISPOSABLE) ×6 IMPLANT
SPONGE LAP 4X18 RFD (DISPOSABLE) ×3 IMPLANT
SPONGE SURGIFOAM ABS GEL 100 (HEMOSTASIS) ×3 IMPLANT
STAPLER VISISTAT 35W (STAPLE) ×3 IMPLANT
STRIP CLOSURE SKIN 1/2X4 (GAUZE/BANDAGES/DRESSINGS) IMPLANT
SURGIFLO W/THROMBIN 8M KIT (HEMOSTASIS) IMPLANT
SUT MNCRL AB 4-0 PS2 18 (SUTURE) ×3 IMPLANT
SUT VIC AB 0 CT1 18XCR BRD 8 (SUTURE) ×1 IMPLANT
SUT VIC AB 0 CT1 8-18 (SUTURE) ×2
SUT VIC AB 1 CT1 18XCR BRD 8 (SUTURE) ×1 IMPLANT
SUT VIC AB 1 CT1 8-18 (SUTURE) ×2
SUT VIC AB 2-0 CT2 18 VCP726D (SUTURE) ×3 IMPLANT
SYR BULB IRRIGATION 50ML (SYRINGE) ×3 IMPLANT
SYR CONTROL 10ML LL (SYRINGE) ×3 IMPLANT
SYRINGE 20CC LL (MISCELLANEOUS) ×3 IMPLANT
TAPE CLOTH SURG 6X10 WHT LF (GAUZE/BANDAGES/DRESSINGS) ×3 IMPLANT
TOWEL GREEN STERILE (TOWEL DISPOSABLE) ×3 IMPLANT
TOWEL GREEN STERILE FF (TOWEL DISPOSABLE) ×3 IMPLANT
TRAY FOLEY MTR SLVR 16FR STAT (SET/KITS/TRAYS/PACK) ×3 IMPLANT
WATER STERILE IRR 1000ML POUR (IV SOLUTION) ×3 IMPLANT
YANKAUER SUCT BULB TIP NO VENT (SUCTIONS) ×3 IMPLANT

## 2018-08-14 NOTE — Anesthesia Preprocedure Evaluation (Addendum)
Anesthesia Evaluation  Patient identified by MRN, date of birth, ID band Patient awake    Reviewed: Allergy & Precautions, NPO status , Patient's Chart, lab work & pertinent test results  History of Anesthesia Complications Negative for: history of anesthetic complications (Remote hx of airway swelling after intubation, no recent problems with DL or videolaryngoscopy)  Airway Mallampati: II  TM Distance: >3 FB Neck ROM: Full    Dental no notable dental hx.    Pulmonary former smoker,    Pulmonary exam normal        Cardiovascular hypertension, Pt. on medications Normal cardiovascular exam     Neuro/Psych Spinal stenosis s/p multiple spine surgeries    GI/Hepatic Neg liver ROS, GERD  Medicated and Controlled,  Endo/Other  negative endocrine ROS  Renal/GU negative Renal ROS     Musculoskeletal  (+) Arthritis ,   Abdominal   Peds  Hematology negative hematology ROS (+)   Anesthesia Other Findings Day of surgery medications reviewed with the patient.  Reproductive/Obstetrics                            Anesthesia Physical Anesthesia Plan  ASA: III  Anesthesia Plan: General   Post-op Pain Management:    Induction: Intravenous  PONV Risk Score and Plan: 3 and Treatment may vary due to age or medical condition, Ondansetron and Midazolam  Airway Management Planned: Oral ETT  Additional Equipment:   Intra-op Plan:   Post-operative Plan: Extubation in OR  Informed Consent: I have reviewed the patients History and Physical, chart, labs and discussed the procedure including the risks, benefits and alternatives for the proposed anesthesia with the patient or authorized representative who has indicated his/her understanding and acceptance.     Dental advisory given  Plan Discussed with: CRNA  Anesthesia Plan Comments: (Ketamine 0.5mg /kg IV prior to incision then 10mg  Q1H)        Anesthesia Quick Evaluation

## 2018-08-14 NOTE — Op Note (Signed)
PATIENT NAME: Walter White.   MEDICAL RECORD NO.:   [id number]    PHYSICIAN:  Phylliss Bob, MD      DATE OF BIRTH: 09/29/1950   DATE OF PROCEDURE: 08/14/2018                                                                           OPERATIVE REPORT   PREOPERATIVE DIAGNOSES: 1.  Lumbar radiculopathy 2.  S/p previous L3-S1 fusion 3.  Adjacent segment degeneration and stenosis at L2/3   POSTOPERATIVE DIAGNOSES: 1.  Lumbar radiculopathy 2.  S/p previous L3-S1 fusion 3.  Adjacent segment degeneration and stenosis at L2/3   PROCEDURE:  1.  Right-sided lateral interbody fusion, L2/3  via direct lateral retroperitoneal approach. 2.  Insertion of interbody device x1 (10 x 22 mm x 55 mm NuVasive intervertebral spacer). 3.  Placement of anterior instrumentation, L2/3 4.  Use of morselized allograft -- VivaGen.   5.  Intraoperative use of fluoroscopy.   SURGEON:  Phylliss Bob, MD   ASSISTANT:  Pricilla Holm PA-C.   ANESTHESIA:  General endotracheal anesthesia.   COMPLICATIONS:  None.   DISPOSITION:  Stable.   ESTIMATED BLOOD LOSS:  Minimal.   INDICATIONS:  Briefly, the patient is a very pleasant 68 year old male who did present to me with ongoing pain in the bilateral legs.  He is noted to be status post a previous fusion from L3-S1. Given his ongoing pain and dysfunction, we did elect to proceed with the surgery noted above. The patient did wish to proceed, after a full understanding of the risks and benefits of surgery.   DESCRIPTION OF PROCEDURE:  On 08/14/2018 the patient was brought to surgery and general endotracheal anesthesia was administered.  The patient was placed in the lateral decubitus position, with the right side up.  Neurologic monitoring leads were placed by the monitoring technician.  The patient's torso and lower extremities were secured to the bed.  The patient's hips and knees were flexed in order to lessen the tension on the psoas musculature.  The right flank  was then prepped and draped in the usual sterile fashion.  The bed was flexed, in order to optimize exposure to the L2/3 intervertebral space.  After a timeout procedure was performed, a right-sided transverse incision was made over the right flank overlying the L2/3 intervertebral space.  The retroperitoneal space was encountered, after dissection through the oblique musculature. The peritoneum was bluntly swept anteriorly, and the psoas was readily identified.  I did use a series of dilators to dock over the L2/3 intervertebral space.  I did use neurologic monitoring while placing the dilators, in order to ensure that there were no neurologic structures in the immediate vicinity of the dilators.  The lumbar plexus was noted to be posterior.  A self-retaining retractor was placed, and was attached to a rigid arm.  The retractor was very gently dilated and a shim was placed into the L2/3 intervertebral space.  I then used a knife to perform an annulotomy at the lateral aspect of the L2/3 intervertebral space.  I then used a series of curettes and pituitary rongeurs in order to perform a thorough and complete L2/3 intervertebral diskectomy.  The  contralateral annulus was released.  I then placed a series of intervertebral spacer trials, and I did feel that a 10 x 22 mm x 55 mm spacer would be the most appropriate fit.  The appropriate spacer was then packed with ViviGen and tamped into position.  I was very pleased with the final resting position of the intervertebral spacer.  Excellent height restoration was noted on the right side, the side of the preoperative collapse. At this point, a 10 mm plate was placed over the lateral aspect of the L2 and L3 vertebral bodies.  I then used an awl to prepare the trajectory of the L2 and L3 vertebral body screws.  A 45 mm screw was placed into the L2 vertebral body, and a 40 mm screw was placed into the L3 vertebral body.  The screws were then locked into the plate.  The break  in the bed was removed and the bed was flattened and the plate was then locked.  I was very pleased with the final AP and lateral fluoroscopic images and the excellent restoration of disk height identified on both AP and lateral images.  At this point, the wound was copiously irrigated.  The fascia, internal, and external oblique musculature was closed using #1 Vicryl.  The subcutaneous layer was closed using 2-0 Vicryl and the skin was closed using 4-0 Monocryl.     Of note, I did use neurologic monitoring throughout the entire surgery, and there was no sustained EMG activity noted throughout the entire surgery. All instrument counts were correct at the termination of the procedure.   Of note, Pricilla Holm was my assistant throughout surgery, and did aid in retraction, suctioning, and closure.  Phylliss Bob, MD

## 2018-08-14 NOTE — H&P (Signed)
PREOPERATIVE H&P  Chief Complaint: Bilateral leg pain  HPI: Walter White. is a 68 y.o. male who presents with ongoing pain in the bilateral legs  MRI reveals stenosis at the level above the patient's fusion, at L2/3  Patient has failed multiple forms of conservative care and continues to have pain (see office notes for additional details regarding the patient's full course of treatment)  Past Medical History:  Diagnosis Date  . AAA (abdominal aortic aneurysm) (HCC)    4.0 cm AAA 08/22/17 U/S  . Allergy    Allergic Rhinitis  . Anticipated difficulty with intubation   . Arthritis    spinal steniosis  . At risk for difficult intubation    no trouble intubating as far as pt knows- swelling afterward -above note previous  . Benign prostate hyperplasia    with lower urinary tract symptoms  . Carpal tunnel syndrome on right   . Cataracts, both eyes   . Chronic back pain    caused by spinal stenosis  . 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 Elevated PSA   . GERD (gastroesophageal reflux disease)   . Hyperlipidemia   . Hypertension    Essential.    . MGUS (monoclonal gammopathy of unknown significance)   . Microhematuria   . OA (osteoarthritis) of knee   . Pneumonia    Hx -when very young  . PONV (postoperative nausea and vomiting)   . Pre-diabetes   . Seasonal allergies   . Spinal stenosis    of lumbosacral region.  Causes chronic back pain.  [name] Upper abdominal pain    Past Surgical History:  Procedure Laterality Date  . ABDOMINAL EXPOSURE N/A 09/05/2017   Procedure: ABDOMINAL EXPOSURE;  Surgeon: Rosetta Posner, MD;  Location: [facility] OR;  Service: Vascular;  Laterality: N/A;  . ANTERIOR LAT LUMBAR FUSION Left 01/26/2016   Procedure: LEFT SIDED LATERAL INTERBODY FUSION, LUMBAR 3-4 WITH INSTRUMENTATION AND ALLOGRAFT;  Surgeon: Phylliss Bob, MD;  Location: [place];  Service:  Orthopedics;  Laterality: Left;  LEFT SIDED LATERAL INTERBODY FUSION, LUMBAR 3-4 WITH INSTRUMENTATION AND ALLOGRAFT  . ANTERIOR LATERAL LUMBAR FUSION WITH PERCUTANEOUS SCREW 1 LEVEL N/A 09/05/2017   Procedure: LUMBAR 5 - SACRUM 1 ANTERIOR LUMBAR INTERBODY FUSION WITH INSTRUMENTATION AND ALLOGRAFT;  Surgeon: Phylliss Bob, MD;  Location: [place];  Service: Orthopedics;  Laterality: N/A;  . BACK SURGERY  01/2015   dr. Lynann Bologna. [phone].  [name] CARPAL TUNNEL RELEASE     right   . CATARACT EXTRACTION, BILATERAL    . COLONOSCOPY    . COLONOSCOPY  2010   polyp  . ELBOW SURGERY     right ulnar release  . ELBOW SURGERY     LEFT CTR  . JOINT REPLACEMENT     left- Sept 2016  . KNEE ARTHROSCOPY  11/23/2010   Procedure: ARTHROSCOPY KNEE;  Surgeon: Kerin Salen;  Location: [place];  Service: Orthopedics;  Laterality: Left;  arthroscopy left knee  . KNEE ARTHROSCOPY     left x 2  . Knee scar tissue removal Left 2018  . LATERAL FUSION LUMBAR SPINE  01./17/2018  . polypectomy  2010  . SHOULDER ARTHROSCOPY Left    x2  . TONSILLECTOMY    . TOTAL KNEE ARTHROPLASTY Left 09/21/2014   Procedure: TOTAL KNEE ARTHROPLASTY;  Surgeon: Frederik Pear, MD;  Location: [facility]  OR;  Service: Orthopedics;  Laterality: Left;  . ULNAR NERVE TRANSPOSITION Left 04/16/2017   Procedure: LEFT ULNAR NEUROPLASTY AT THE WRIST AND TRANSPOSITION AT THE ELBOW;  Surgeon: Milly Jakob, MD;  Location: [place];  Service: Orthopedics;  Laterality: Left;  . WISDOM TOOTH EXTRACTION     Social History   Socioeconomic History  . Marital status: Married    Spouse name: [name]   . Number of children: 2  . Years of education: Some colle  . Highest education level: Not on file  Occupational History  . Occupation: Retired  Scientific laboratory technician  . Financial resource strain: Not on file  . Food insecurity    Worry: Not on file    Inability: Not on file  . Transportation needs    Medical: Not on file     Non-medical: Not on file  Tobacco Use  . Smoking status: Former Smoker    Quit date: 11/21/1980    Years since quitting: 37.7  . Smokeless tobacco: Never Used  Substance and Sexual Activity  . Alcohol use: Yes    Alcohol/week: 0.0 standard drinks    Comment: Very rare  . Drug use: No  . Sexual activity: Not on file  Lifestyle  . Physical activity    Days per week: Not on file    Minutes per session: Not on file  . Stress: Not on file  Relationships  . Social Herbalist on phone: Not on file    Gets together: Not on file    Attends religious service: Not on file    Active member of club or organization: Not on file    Attends meetings of clubs or organizations: Not on file    Relationship status: Not on file  Other Topics Concern  . Not on file  Social History Narrative   Lives with wife   Caffeine use: tea every day   Family History  Problem Relation Age of Onset  . Cancer Mother   . Liver disease Father   . Colon cancer Neg Hx   . Colon polyps Neg Hx   . Esophageal cancer Neg Hx   . Rectal cancer Neg Hx   . Stomach cancer Neg Hx    Allergies  Allergen Reactions  . Pork-Derived Products Shortness Of Breath  . Shellfish Allergy Shortness Of Breath  . Lovastatin Other (See Comments)    Bone pain   . Penicillins     UNSPECIFIED CHILDHOOD REACTION  Has patient had a PCN reaction causing immediate rash, facial/tongue/throat swelling, SOB or lightheadedness with hypotension: Unknown Has patient had a PCN reaction causing severe rash involving mucus membranes or skin necrosis: Unknown Has patient had a PCN reaction that required hospitalization: Unknown Has patient had a PCN reaction occurring within the last 10 years: No If all of the above answers are "NO", then may proceed with Cephalosporin use.    Prior to Admission medications   Medication Sig Start Date End Date Taking? Authorizing Provider  diclofenac (VOLTAREN) 75 MG EC tablet Take 75 mg by mouth  2 (two) times daily.  03/12/18  Yes [provider]  docusate sodium (COLACE) 100 MG capsule Take 100 mg by mouth daily.    Yes [provider]  gabapentin (NEURONTIN) 600 MG tablet Take 600 mg by mouth 2 (two) times a day.   Yes [provider]  HYDROcodone-acetaminophen (NORCO/VICODIN) 5-325 MG tablet Take 1 tablet by mouth 2 (two) times a day.  02/28/18  Yes [provider]  lisinopril-hydrochlorothiazide (PRINZIDE,ZESTORETIC) 20-25 MG tablet Take 1 tablet by mouth daily. 07/08/17  Yes [provider]  MAGNESIUM CITRATE PO Take 750 mg by mouth daily.   Yes [provider]  methocarbamol (ROBAXIN) 750 MG tablet Take 750 mg by mouth 2 (two) times a day.   Yes [provider]  Nutritional Supplements (JUICE PLUS FIBRE PO) Take 6 capsules by mouth daily. Juice Plus+ Orchard Blend 2 capsules in the morning Juice Plus+ Garden Blend 2 capsules in the morning Juice Plus+ Vineyard Blend 2 capsule in the morning   Yes [provider]  pantoprazole (PROTONIX) 40 MG tablet Take 40 mg by mouth daily.   Yes [provider]  rosuvastatin (CRESTOR) 5 MG tablet Take 5 mg by mouth daily.   Yes [provider]  Tamsulosin HCl (FLOMAX) 0.4 MG CAPS Take 0.4 mg by mouth at bedtime.    Yes [provider]  tiZANidine (ZANAFLEX) 4 MG tablet Take 4 mg by mouth at bedtime.   Yes [provider]  Tetrahydrozoline HCl (VISINE OP) Place 1 drop into both eyes daily as needed (irritation).    [provider]     All other systems have been reviewed and were otherwise negative with the exception of those mentioned in the HPI and as above.  Physical Exam: Vitals:   08/14/18 0708  BP: 102/71  Pulse: 83  Resp: 18  Temp: 98.2 F (36.8 C)  SpO2: 100%    Body mass index is 36.81 kg/m.  General: Alert, no acute distress Cardiovascular: No pedal edema Respiratory: No cyanosis, no use of accessory  musculature Skin: No lesions in the area of chief complaint Neurologic: Sensation intact distally Psychiatric: Patient is competent for consent with normal mood and affect Lymphatic: No axillary or cervical lymphadenopathy   Assessment/Plan: ADJACENT SEGMENT DISEASE AND STENOSIS INVOLVING LUMBAR 2 - LUMBAR 3,  Plan for Procedure(s): RIGHT LUMBAR two to LUMBAR three LATERAL INTERBODY FUSION WITH INSTRUMENTATION AND ALLOGRAFT   Norva Karvonen, MD 08/14/2018 8:17 AM

## 2018-08-14 NOTE — Progress Notes (Signed)
Pharmacy Antibiotic Note  Walter White. is a 68 y.o. male admitted on 08/14/2018 for lumbar fusion surgery. S/p lumbar fusion, pharmacy has been consulted for Vancomycin dosing for 1 dose 12 hours post-op unless patient has a drain, then continue vancomycin until discontinued by physician..  Received vancomycin 1500 mg IV x1 preop today at 07:48.  No drain.  CrCl 70 ml/min  Plan: Vancomycin 1000 mg IV x1 at 20:00 tonight. Pharmacy will sign off.   Height: 5\' 7"  (170.2 cm) Weight: 235 lb (106.6 kg) IBW/kg (Calculated) : 66.1  Temp (24hrs), Avg:97.8 F (36.6 C), Min:97.6 F (36.4 C), Max:98.2 F (36.8 C)  No results for input(s): WBC, CREATININE, LATICACIDVEN, VANCOTROUGH, VANCOPEAK, VANCORANDOM, GENTTROUGH, GENTPEAK, GENTRANDOM, TOBRATROUGH, TOBRAPEAK, TOBRARND, AMIKACINPEAK, AMIKACINTROU, AMIKACIN in the last 168 hours.  Estimated Creatinine Clearance: 70.8 mL/min (by C-G formula based on SCr of 1.13 mg/dL).    Allergies  Allergen Reactions  . Pork-Derived Products Shortness Of Breath  . Shellfish Allergy Shortness Of Breath  . Lovastatin Other (See Comments)    Bone pain   . Penicillins     UNSPECIFIED CHILDHOOD REACTION  Has patient had a PCN reaction causing immediate rash, facial/tongue/throat swelling, SOB or lightheadedness with hypotension: Unknown Has patient had a PCN reaction causing severe rash involving mucus membranes or skin necrosis: Unknown Has patient had a PCN reaction that required hospitalization: Unknown Has patient had a PCN reaction occurring within the last 10 years: No If all of the above answers are "NO", then may proceed with Cephalosporin use.   Thank you for allowing pharmacy to be a part of this patient's care. Nicole Cella, RPh Clinical Pharmacist [phone] Please check AMION for all [place] phone numbers After 10:00 PM, call [place] [phone] 08/14/2018 3:35 PM

## 2018-08-14 NOTE — Anesthesia Postprocedure Evaluation (Signed)
Anesthesia Post Note  Patient: Sasan Wilkie.  Procedure(s) Performed: RIGHT LUMBAR two to LUMBAR three LATERAL INTERBODY FUSION WITH INSTRUMENTATION AND ALLOGRAFT (Right Spine Lumbar)     Patient location during evaluation: PACU Anesthesia Type: General Level of consciousness: awake and alert Pain management: pain level controlled Vital Signs Assessment: post-procedure vital signs reviewed and stable Respiratory status: spontaneous breathing, nonlabored ventilation, respiratory function stable and patient connected to nasal cannula oxygen Cardiovascular status: blood pressure returned to baseline and stable Postop Assessment: no apparent nausea or vomiting Anesthetic complications: no    Last Vitals:  Vitals:   08/14/18 1230 08/14/18 1255  BP: 127/66 134/76  Pulse: 67 60  Resp: 14 18  Temp: 36.4 C   SpO2: 100% 96%    Last Pain:  Vitals:   08/14/18 1430  TempSrc:   PainSc: 7                  Idy Rawling P Chevon Fomby

## 2018-08-14 NOTE — Transfer of Care (Signed)
Immediate Anesthesia Transfer of Care Note  Patient: Walter White.  Procedure(s) Performed: RIGHT LUMBAR two to LUMBAR three LATERAL INTERBODY FUSION WITH INSTRUMENTATION AND ALLOGRAFT (Right Spine Lumbar)  Patient Location: PACU  Anesthesia Type:General  Level of Consciousness: drowsy  Airway & Oxygen Therapy: Patient Spontanous Breathing and Patient connected to nasal cannula oxygen  Post-op Assessment: Report given to RN, Post -op Vital signs reviewed and stable and Patient moving all extremities  Post vital signs: Reviewed and stable  Last Vitals:  Vitals Value Taken Time  BP 127/80 08/14/18 1136  Temp    Pulse 79 08/14/18 1138  Resp 15 08/14/18 1138  SpO2 97 % 08/14/18 1138  Vitals shown include unvalidated device data.  Last Pain:  Vitals:   08/14/18 0713  TempSrc:   PainSc: 7       Patients Stated Pain Goal: 2 (67/20/91 9802)  Complications: No apparent anesthesia complications

## 2018-08-14 NOTE — Anesthesia Procedure Notes (Signed)
Procedure Name: Intubation Date/Time: 08/14/2018 8:51 AM Performed by: Strider Vallance T, CRNA Pre-anesthesia Checklist: Patient identified, Emergency Drugs available, Suction available and Patient being monitored Patient Re-evaluated:Patient Re-evaluated prior to induction Oxygen Delivery Method: Circle system utilized Preoxygenation: Pre-oxygenation with 100% oxygen Induction Type: IV induction and Rapid sequence Ventilation: Mask ventilation without difficulty Laryngoscope Size: Miller and 3 Grade View: Grade I Tube type: Oral Tube size: 7.5 mm Number of attempts: 1 Airway Equipment and Method: Patient positioned with wedge pillow and Stylet Placement Confirmation: ETT inserted through vocal cords under direct vision,  positive ETCO2 and breath sounds checked- equal and bilateral Secured at: 22 cm Tube secured with: Tape Dental Injury: Teeth and Oropharynx as per pre-operative assessment

## 2018-08-15 ENCOUNTER — Inpatient Hospital Stay (HOSPITAL_COMMUNITY): Payer: Medicare Other | Admitting: Certified Registered"

## 2018-08-15 ENCOUNTER — Encounter (HOSPITAL_COMMUNITY): Payer: Self-pay | Admitting: Anesthesiology

## 2018-08-15 ENCOUNTER — Encounter (HOSPITAL_COMMUNITY)
Admission: RE | Disposition: A | Discharge status: Home / Self Care | Payer: Self-pay | Source: Home / Self Care | Attending: Orthopedic Surgery

## 2018-08-15 ENCOUNTER — Inpatient Hospital Stay (HOSPITAL_COMMUNITY): Admission: RE | Admit: 2018-08-15 | Payer: Medicare Other | Source: Home / Self Care | Admitting: Orthopedic Surgery

## 2018-08-15 ENCOUNTER — Inpatient Hospital Stay (HOSPITAL_COMMUNITY): Payer: Medicare Other

## 2018-08-15 SURGERY — POSTERIOR LUMBAR FUSION 1 LEVEL
Anesthesia: General | Site: Spine Lumbar

## 2018-08-15 MED ORDER — LACTATED RINGERS IV SOLN
INTRAVENOUS | Status: DC | PRN
  Administered 2018-08-15: 07:00:00 via INTRAVENOUS

## 2018-08-15 MED ORDER — PROPOFOL 1000 MG/100ML IV EMUL
INTRAVENOUS | Status: AC
  Filled 2018-08-15: qty 200

## 2018-08-15 MED ORDER — LIDOCAINE 2% (20 MG/ML) 5 ML SYRINGE
INTRAMUSCULAR | Status: DC | PRN
  Administered 2018-08-15: 40 mg via INTRAVENOUS

## 2018-08-15 MED ORDER — 0.9 % SODIUM CHLORIDE (POUR BTL) OPTIME
TOPICAL | Status: DC | PRN
  Administered 2018-08-15: 1000 mL

## 2018-08-15 MED ORDER — MIDAZOLAM HCL 5 MG/5ML IJ SOLN
INTRAMUSCULAR | Status: DC | PRN
  Administered 2018-08-15: 2 mg via INTRAVENOUS

## 2018-08-15 MED ORDER — EPHEDRINE SULFATE-NACL 50-0.9 MG/10ML-% IV SOSY
PREFILLED_SYRINGE | INTRAVENOUS | Status: DC | PRN
  Administered 2018-08-15: 20 mg via INTRAVENOUS

## 2018-08-15 MED ORDER — KETAMINE HCL 10 MG/ML IJ SOLN
INTRAMUSCULAR | Status: DC | PRN
  Administered 2018-08-15: 10 mg via INTRAVENOUS
  Administered 2018-08-15: 20 mg via INTRAVENOUS
  Administered 2018-08-15: 30 mg via INTRAVENOUS

## 2018-08-15 MED ORDER — PROMETHAZINE HCL 25 MG/ML IJ SOLN: 6.2500 mg | INTRAMUSCULAR | Status: DC | PRN

## 2018-08-15 MED ORDER — ALBUMIN HUMAN 5 % IV SOLN
INTRAVENOUS | Status: DC | PRN
  Administered 2018-08-15: 08:00:00 via INTRAVENOUS

## 2018-08-15 MED ORDER — SUCCINYLCHOLINE CHLORIDE 200 MG/10ML IV SOSY
PREFILLED_SYRINGE | INTRAVENOUS | Status: DC | PRN
  Administered 2018-08-15: 100 mg via INTRAVENOUS

## 2018-08-15 MED ORDER — PROMETHAZINE HCL 25 MG/ML IJ SOLN
6.2500 mg | INTRAMUSCULAR | Status: AC
  Administered 2018-08-15: 6.25 mg via INTRAVENOUS
  Filled 2018-08-15: qty 1

## 2018-08-15 MED ORDER — VANCOMYCIN HCL IN DEXTROSE 1-5 GM/200ML-% IV SOLN
INTRAVENOUS | Status: AC
  Filled 2018-08-15: qty 200

## 2018-08-15 MED ORDER — PHENYLEPHRINE HCL-NACL 10-0.9 MG/250ML-% IV SOLN
INTRAVENOUS | Status: AC
  Filled 2018-08-15: qty 500

## 2018-08-15 MED ORDER — BUPIVACAINE-EPINEPHRINE (PF) 0.25% -1:200000 IJ SOLN
INTRAMUSCULAR | Status: AC
  Filled 2018-08-15: qty 30

## 2018-08-15 MED ORDER — BUPIVACAINE-EPINEPHRINE 0.25% -1:200000 IJ SOLN
INTRAMUSCULAR | Status: DC | PRN
  Administered 2018-08-15: 10 mL

## 2018-08-15 MED ORDER — PROPOFOL 500 MG/50ML IV EMUL
INTRAVENOUS | Status: DC | PRN
  Administered 2018-08-15: 100 ug/kg/min via INTRAVENOUS

## 2018-08-15 MED ORDER — BUPIVACAINE LIPOSOME 1.3 % IJ SUSP
20.0000 mL | INTRAMUSCULAR | Status: AC
  Administered 2018-08-15: 20 mL
  Filled 2018-08-15: qty 20

## 2018-08-15 MED ORDER — KETAMINE HCL 50 MG/5ML IJ SOSY
PREFILLED_SYRINGE | INTRAMUSCULAR | Status: AC
  Filled 2018-08-15: qty 10

## 2018-08-15 MED ORDER — THROMBIN 20000 UNITS EX SOLR
CUTANEOUS | Status: DC | PRN
  Administered 2018-08-15: 20 mL via TOPICAL

## 2018-08-15 MED ORDER — HYDROMORPHONE HCL 1 MG/ML IJ SOLN: 0.2500 mg | INTRAMUSCULAR | Status: DC | PRN

## 2018-08-15 MED ORDER — METHYLENE BLUE 0.5 % INJ SOLN
INTRAVENOUS | Status: AC
  Filled 2018-08-15: qty 10

## 2018-08-15 MED ORDER — SODIUM CHLORIDE 0.9 % IV SOLN
INTRAVENOUS | Status: DC | PRN
  Administered 2018-08-15: 30 ug/min via INTRAVENOUS

## 2018-08-15 MED ORDER — OXYCODONE HCL 5 MG PO TABS: 5.0000 mg | ORAL_TABLET | Freq: Once | ORAL | Status: DC | PRN

## 2018-08-15 MED ORDER — VANCOMYCIN HCL 1000 MG IV SOLR
INTRAVENOUS | Status: DC | PRN
  Administered 2018-08-15: 1000 mg via INTRAVENOUS

## 2018-08-15 MED ORDER — OXYCODONE HCL 5 MG/5ML PO SOLN: 5.0000 mg | Freq: Once | ORAL | Status: DC | PRN

## 2018-08-15 MED ORDER — FENTANYL CITRATE (PF) 100 MCG/2ML IJ SOLN
INTRAMUSCULAR | Status: DC | PRN
  Administered 2018-08-15: 50 ug via INTRAVENOUS
  Administered 2018-08-15: 100 ug via INTRAVENOUS
  Administered 2018-08-15 (×2): 25 ug via INTRAVENOUS

## 2018-08-15 MED ORDER — ACETAMINOPHEN 10 MG/ML IV SOLN
INTRAVENOUS | Status: AC
  Filled 2018-08-15: qty 200

## 2018-08-15 MED ORDER — PROPOFOL 10 MG/ML IV BOLUS
INTRAVENOUS | Status: DC | PRN
  Administered 2018-08-15: 180 mg via INTRAVENOUS

## 2018-08-15 MED ORDER — THROMBIN 20000 UNITS EX SOLR
CUTANEOUS | Status: AC
  Filled 2018-08-15: qty 20000

## 2018-08-15 MED ORDER — ONDANSETRON HCL 4 MG/2ML IJ SOLN
INTRAMUSCULAR | Status: DC | PRN
  Administered 2018-08-15: 4 mg via INTRAVENOUS

## 2018-08-15 MED ORDER — ACETAMINOPHEN 10 MG/ML IV SOLN
INTRAVENOUS | Status: DC | PRN
  Administered 2018-08-15: 1000 mg via INTRAVENOUS

## 2018-08-15 MED ORDER — ACETAMINOPHEN 10 MG/ML IV SOLN: 1000.0000 mg | Freq: Once | INTRAVENOUS | Status: DC | PRN

## 2018-08-15 SURGICAL SUPPLY — 93 items
BENZOIN TINCTURE PRP APPL 2/3 (GAUZE/BANDAGES/DRESSINGS) ×3 IMPLANT
BLADE CLIPPER SURG (BLADE) ×3 IMPLANT
BONE VIVIGEN FORMABLE 1.3CC (Bone Implant) ×3 IMPLANT
BUR PRESCISION 1.7 ELITE (BURR) ×3 IMPLANT
BUR ROUND FLUTED 5 RND (BURR) ×2 IMPLANT
BUR ROUND FLUTED 5MM RND (BURR) ×1
BUR ROUND PRECISION 4.0 (BURR) IMPLANT
BUR ROUND PRECISION 4.0MM (BURR)
BUR SABER RD CUTTING 3.0 (BURR) IMPLANT
BUR SABER RD CUTTING 3.0MM (BURR)
CARTRIDGE OIL MAESTRO DRILL (MISCELLANEOUS) ×1 IMPLANT
CLOSURE WOUND 1/2 X4 (GAUZE/BANDAGES/DRESSINGS) ×2
CONNECTOR EXPEDIUM TI 55MM (Connector) ×6 IMPLANT
CONT SPEC 4OZ CLIKSEAL STRL BL (MISCELLANEOUS) ×3 IMPLANT
COVER MAYO STAND STRL (DRAPES) ×6 IMPLANT
COVER SURGICAL LIGHT HANDLE (MISCELLANEOUS) IMPLANT
COVER WAND RF STERILE (DRAPES) IMPLANT
DIFFUSER DRILL AIR PNEUMATIC (MISCELLANEOUS) ×3 IMPLANT
DRAIN CHANNEL 15F RND FF W/TCR (WOUND CARE) IMPLANT
DRAPE C-ARM 42X72 X-RAY (DRAPES) ×3 IMPLANT
DRAPE C-ARMOR (DRAPES) ×3 IMPLANT
DRAPE POUCH INSTRU U-SHP 10X18 (DRAPES) IMPLANT
DRAPE SURG 17X23 STRL (DRAPES) ×12 IMPLANT
DURAPREP 26ML APPLICATOR (WOUND CARE) ×3 IMPLANT
ELECT BLADE 4.0 EZ CLEAN MEGAD (MISCELLANEOUS)
ELECT CAUTERY BLADE 6.4 (BLADE) ×6 IMPLANT
ELECT REM PT RETURN 9FT ADLT (ELECTROSURGICAL) ×3
ELECTRODE BLDE 4.0 EZ CLN MEGD (MISCELLANEOUS) IMPLANT
ELECTRODE REM PT RTRN 9FT ADLT (ELECTROSURGICAL) ×1 IMPLANT
EVACUATOR SILICONE 100CC (DRAIN) IMPLANT
FEE INTRAOP MONITOR IMPULS NCS (MISCELLANEOUS) ×1 IMPLANT
FILTER STRAW FLUID ASPIR (MISCELLANEOUS) IMPLANT
GAUZE 4X4 16PLY RFD (DISPOSABLE) IMPLANT
GAUZE SPONGE 4X4 12PLY STRL (GAUZE/BANDAGES/DRESSINGS) IMPLANT
GLOVE BIO SURGEON STRL SZ7 (GLOVE) ×3 IMPLANT
GLOVE BIO SURGEON STRL SZ8 (GLOVE) ×3 IMPLANT
GLOVE BIOGEL PI IND STRL 7.0 (GLOVE) ×1 IMPLANT
GLOVE BIOGEL PI IND STRL 8 (GLOVE) ×1 IMPLANT
GLOVE BIOGEL PI INDICATOR 7.0 (GLOVE) ×2
GLOVE BIOGEL PI INDICATOR 8 (GLOVE) ×2
GLOVE SURG SS PI 7.5 STRL IVOR (GLOVE) ×18 IMPLANT
GOWN STRL REUS W/ TWL LRG LVL3 (GOWN DISPOSABLE) ×2 IMPLANT
GOWN STRL REUS W/ TWL XL LVL3 (GOWN DISPOSABLE) ×1 IMPLANT
GOWN STRL REUS W/TWL LRG LVL3 (GOWN DISPOSABLE) ×4
GOWN STRL REUS W/TWL XL LVL3 (GOWN DISPOSABLE) ×2
GUIDEWIRE BLUNT VIPER II 1.45 (WIRE) ×3 IMPLANT
GUIDEWIRE SHARP VIPER II (WIRE) ×6 IMPLANT
INTRAOP MONITOR FEE IMPULS NCS (MISCELLANEOUS) ×1
INTRAOP MONITOR FEE IMPULSE (MISCELLANEOUS) ×2
IV CATH 14GX2 1/4 (CATHETERS) ×3 IMPLANT
KIT ALARA NEURO ACCESS (KITS) ×6 IMPLANT
KIT BASIN OR (CUSTOM PROCEDURE TRAY) ×3 IMPLANT
KIT POSITION SURG JACKSON T1 (MISCELLANEOUS) ×3 IMPLANT
KIT TURNOVER KIT B (KITS) ×3 IMPLANT
MARKER SKIN DUAL TIP RULER LAB (MISCELLANEOUS) ×6 IMPLANT
NEEDLE 18GX1X1/2 (RX/OR ONLY) (NEEDLE) IMPLANT
NEEDLE 22X1 1/2 (OR ONLY) (NEEDLE) ×3 IMPLANT
NEEDLE HYPO 25GX1X1/2 BEV (NEEDLE) ×3 IMPLANT
NEEDLE SPNL 18GX3.5 QUINCKE PK (NEEDLE) ×3 IMPLANT
NS IRRIG 1000ML POUR BTL (IV SOLUTION) ×3 IMPLANT
OIL CARTRIDGE MAESTRO DRILL (MISCELLANEOUS) ×3
PACK LAMINECTOMY NEURO (CUSTOM PROCEDURE TRAY) ×3 IMPLANT
PACK UNIVERSAL I (CUSTOM PROCEDURE TRAY) ×3 IMPLANT
PAD ARMBOARD 7.5X6 YLW CONV (MISCELLANEOUS) IMPLANT
PATTIES SURGICAL .5 X1 (DISPOSABLE) IMPLANT
PATTIES SURGICAL .5X1.5 (GAUZE/BANDAGES/DRESSINGS) IMPLANT
PROBE PEDCLE PROBE MAGSTM DISP (MISCELLANEOUS) ×3 IMPLANT
ROD LORDOSED 75MM VIPER 2 (Rod) ×6 IMPLANT
SCREW SET SINGLE INNER MIS (Screw) ×6 IMPLANT
SCREW VIPER 2 XTAB POLY 5X45 (Screw) ×3 IMPLANT
SCREW XTAB POLY VIPER  6X45 (Screw) ×2 IMPLANT
SCREW XTAB POLY VIPER 6X45 (Screw) ×1 IMPLANT
SPONGE INTESTINAL PEANUT (DISPOSABLE) ×6 IMPLANT
SPONGE SURGIFOAM ABS GEL 100 (HEMOSTASIS) ×3 IMPLANT
STRIP CLOSURE SKIN 1/2X4 (GAUZE/BANDAGES/DRESSINGS) ×4 IMPLANT
SURGIFLO W/THROMBIN 8M KIT (HEMOSTASIS) IMPLANT
SUT MNCRL AB 4-0 PS2 18 (SUTURE) ×6 IMPLANT
SUT VIC AB 0 CT1 18XCR BRD 8 (SUTURE) ×1 IMPLANT
SUT VIC AB 0 CT1 8-18 (SUTURE) ×2
SUT VIC AB 1 CT1 18XCR BRD 8 (SUTURE) ×2 IMPLANT
SUT VIC AB 1 CT1 8-18 (SUTURE) ×4
SUT VIC AB 2-0 CP2 18 (SUTURE) IMPLANT
SUT VIC AB 2-0 CT2 18 VCP726D (SUTURE) ×9 IMPLANT
SYR 20ML LL LF (SYRINGE) ×3 IMPLANT
SYR BULB IRRIGATION 50ML (SYRINGE) ×3 IMPLANT
SYR CONTROL 10ML LL (SYRINGE) ×3 IMPLANT
SYR TB 1ML LUER SLIP (SYRINGE) ×3 IMPLANT
TAP CANN VIPER2 DL 5.0 (TAP) ×3 IMPLANT
TAP CANN VIPER2 DL 6.0 (TAP) ×3 IMPLANT
TAPE CLOTH 4X10 WHT NS (GAUZE/BANDAGES/DRESSINGS) ×3 IMPLANT
TRAY FOLEY MTR SLVR 16FR STAT (SET/KITS/TRAYS/PACK) IMPLANT
WATER STERILE IRR 1000ML POUR (IV SOLUTION) ×3 IMPLANT
YANKAUER SUCT BULB TIP NO VENT (SUCTIONS) ×3 IMPLANT

## 2018-08-15 NOTE — Evaluation (Signed)
Physical Therapy Evaluation Patient Details Name: Walter White. MRN: [id number] DOB: 16-Sep-1950 Today's Date: 08/15/2018   History of Present Illness  Pt is a 68 y/o male sp L2-3 lateral interbody fusion on 8/5 and s/p L2-3 Posterior spinal fusion on 8/6. PMH includes AAA, HTN, pre diabetes, L TKA, and back surgery.   Clinical Impression  Patient is s/p above surgery resulting in the deficits listed below (see PT Problem List). Pt with mild unsteadiness during gait requiring min to min guard A. Educated about back precautions and supine HEP. Reports wife can assist as needed at d/c.  Patient will benefit from skilled PT to increase their independence and safety with mobility (while adhering to their precautions) to allow discharge to the venue listed below.     Follow Up Recommendations No PT follow up;Supervision for mobility/OOB    Equipment Recommendations  None recommended by PT    Recommendations for Other Services       Precautions / Restrictions Precautions Precautions: Back Precaution Booklet Issued: Yes (comment) Precaution Comments: Reviewed back precautions with pt Required Braces or Orthoses: Spinal Brace Spinal Brace: Thoracolumbosacral orthotic;Applied in sitting position Restrictions Weight Bearing Restrictions: No      Mobility  Bed Mobility Overal bed mobility: Needs Assistance Bed Mobility: Rolling;Sidelying to Sit;Sit to Sidelying Rolling: Min guard Sidelying to sit: Min guard     Sit to sidelying: Min guard General bed mobility comments: Min guard for safety and to ensure log roll technique. Verbal cues for sequencing.   Transfers Overall transfer level: Needs assistance Equipment used: None Transfers: Sit to/from Stand Sit to Stand: Min guard         General transfer comment: Min guard for steadying assist.   Ambulation/Gait Ambulation/Gait assistance: Min assist;Min guard Gait Distance (Feet): 200 Feet Assistive device: None Gait  Pattern/deviations: Step-through pattern;Decreased stride length Gait velocity: Decreased    General Gait Details: Min guard to occasional min A for steadying assist throughout gait. Pt with mild LOB X2-3 throughout gait. Educated about generalized walking program to perform at home.   Stairs            Wheelchair Mobility    Modified Rankin (Stroke Patients Only)       Balance Overall balance assessment: Needs assistance Sitting-balance support: No upper extremity supported;Feet supported Sitting balance-Leahy Scale: Good     Standing balance support: No upper extremity supported;During functional activity Standing balance-Leahy Scale: Fair Standing balance comment: no UE support                              Pertinent Vitals/Pain Pain Assessment: 0-10 Pain Score: 10-Worst pain ever Pain Location: back Pain Descriptors / Indicators: Aching;Operative site guarding Pain Intervention(s): Limited activity within patient's tolerance;Monitored during session;Repositioned    Home Living Family/patient expects to be discharged to:: Private residence Living Arrangements: Spouse/significant other Available Help at Discharge: Family;Available 24 hours/day Type of Home: House Home Access: Stairs to enter Entrance Stairs-Rails: None Entrance Stairs-Number of Steps: 1 Home Layout: One level Home Equipment: Walker - 2 wheels;Bedside commode;Shower seat      Prior Function Level of Independence: Independent               Hand Dominance        Extremity/Trunk Assessment   Upper Extremity Assessment Upper Extremity Assessment: Defer to OT evaluation    Lower Extremity Assessment Lower Extremity Assessment: Generalized weakness    Cervical / Trunk  Assessment Cervical / Trunk Assessment: Other exceptions Cervical / Trunk Exceptions: s/p lumbar surgery  Communication   Communication: No difficulties  Cognition Arousal/Alertness:  Awake/alert Behavior During Therapy: WFL for tasks assessed/performed Overall Cognitive Status: Within Functional Limits for tasks assessed                                        General Comments General comments (skin integrity, edema, etc.): Pt's wife present throughout session.     Exercises     Assessment/Plan    PT Assessment Patient needs continued PT services  PT Problem List Decreased strength;Decreased balance;Decreased mobility;Decreased knowledge of precautions;Pain       PT Treatment Interventions Gait training;Stair training;Functional mobility training;Therapeutic activities;Therapeutic exercise;Balance training;Patient/family education    PT Goals (Current goals can be found in the Care Plan section)  Acute Rehab PT Goals Patient Stated Goal: to go home PT Goal Formulation: With patient Time For Goal Achievement: 08/29/18 Potential to Achieve Goals: Good    Frequency Min 5X/week   Barriers to discharge        Co-evaluation               AM-PAC PT "6 Clicks" Mobility  Outcome Measure Help needed turning from your back to your side while in a flat bed without using bedrails?: A Little Help needed moving from lying on your back to sitting on the side of a flat bed without using bedrails?: A Little Help needed moving to and from a bed to a chair (including a wheelchair)?: A Little Help needed standing up from a chair using your arms (e.g., wheelchair or bedside chair)?: A Little Help needed to walk in hospital room?: A Little Help needed climbing 3-5 steps with a railing? : A Lot 6 Click Score: 17    End of Session Equipment Utilized During Treatment: Gait belt;Back brace Activity Tolerance: Patient tolerated treatment well Patient left: in bed;with call bell/phone within reach;with family/visitor present Nurse Communication: Mobility status PT Visit Diagnosis: Unsteadiness on feet (R26.81);Muscle weakness (generalized) (M62.81)     Time: 8828-0034 PT Time Calculation (min) (ACUTE ONLY): 18 min   Charges:   PT Evaluation $PT Eval Low Complexity: [place], PT, DPT  Acute Rehabilitation Services  Pager: [phone] Office: [phone]   Rudean Hitt 08/15/2018, 4:46 PM

## 2018-08-15 NOTE — Anesthesia Procedure Notes (Signed)
Procedure Name: Intubation Date/Time: 08/15/2018 7:40 AM Performed by: Cleda Daub, CRNA Pre-anesthesia Checklist: Patient identified, Emergency Drugs available, Suction available and Patient being monitored Patient Re-evaluated:Patient Re-evaluated prior to induction Oxygen Delivery Method: Circle system utilized Preoxygenation: Pre-oxygenation with 100% oxygen Induction Type: IV induction and Rapid sequence Laryngoscope Size: Miller and 2 Grade View: Grade I Tube type: Oral Tube size: 7.5 mm Number of attempts: 1 Airway Equipment and Method: Stylet Placement Confirmation: ETT inserted through vocal cords under direct vision,  positive ETCO2 and breath sounds checked- equal and bilateral Secured at: 23 cm Tube secured with: Tape Dental Injury: Teeth and Oropharynx as per pre-operative assessment

## 2018-08-15 NOTE — Op Note (Signed)
PATIENT NAME: Walter White.   MEDICAL RECORD NO.:   [id number]    PHYSICIAN:  Phylliss Bob, MD      DATE OF BIRTH: 11-Feb-1950   DATE OF PROCEDURE: 08/15/2018                                                                           OPERATIVE REPORT   PREOPERATIVE DIAGNOSES: 1. Lumbar radiculopathy 2.S/p previous L3-S1 fusion 3.Adjacent segment degeneration and stenosis at L2/3 4.  Status post lateral interbody fusion at L2-3, requiring posterior instrumentation and fusion  POSTOPERATIVE DIAGNOSES: 1. Lumbar radiculopathy 2.S/p previous L3-S1 fusion 3.Adjacent segment degeneration and stenosis at L2/3 4.  Status post lateral interbody fusion at L2-3, requiring posterior instrumentation and fusion   PROCEDURE (Stage 2 of 2):  1.  Posterior spinal fusion, L2-3 2.  Placement of posterior segmental instrumentation, extended up to the L2 level (to connect with the patient's previous L3-S1 fusion construct) 3.  Intraoperative use of fluoroscopy 4.  Use of morselized allograft - Vivigen   SURGEON:  Phylliss Bob, MD   ASSISTANT:  Pricilla Holm PA-C.   ANESTHESIA:  General endotracheal anesthesia.   COMPLICATIONS:  None.   DISPOSITION:  Stable.   ESTIMATED BLOOD LOSS:  Minimal.   INDICATIONS:  Briefly, the patient is a very pleasant 68 year old male who did present to me with pain in the bilateral legs.    Please refer to the patient's operative report dated 08/14/2018 for a more detailed account of the patient's indications for surgery.  The patient did present today for stage 2 of what was to be a staged procedure.   DESCRIPTION OF PROCEDURE:  On 08/15/2018, the patient was brought to surgery and general endotracheal anesthesia was administered.  Back was prepped and draped in the usual sterile fashion.  The pedicles were marked out using fluoroscopy.  At this point, I did make paramedian incisions lateral to the patient's previous instrumentation, and lateral to the L2  pedicles.  The bilateral L2-3 facet joints were subperiosteally exposed and decorticated using a high-speed drill.  Allograft in the form of Vivigen was packed into the posterior lateral gutters and facet joints at L2-3. At this point, Jamshidi's were advanced across the L2 pedicles bilaterally.  Guidewires were placed, and I did use a 5 mm tap over the guidewires. Triggered EMG was used to test the taps to ensure that they were not in the vicinity of any neurologic structures.  A 5 x 45 mm screw was advanced into the L2 pedicle on the right, and a 6 x 45 mm screw was placed on the left.  A rod was then secured into the tulip heads of the screws.  A connector was then placed at the inferior aspect of the rod, and the rod was secured to the previously placed segmental rod spanning L3-S1.  Caps were then placed and a final locking procedure was performed.  I was very pleased with the final AP and lateral fluoroscopic images. The wound was copiously irrigated.  I was very pleased with the AP and lateral fluoroscopic images.  The wound was then closed using #1 Vicryl followed by 2-0 Vicryl followed by 4-0 Monocryl.  Benzoin and Steri-Strips  were applied over the left lateral wound and left posterior wound, followed by sterile dressing.     Of note, I did use neurologic monitoring throughout the entire surgery, and there was no sustained EMG activity noted throughout the entire surgery. All instrument counts were correct at the termination of the procedure.   Of note, Pricilla Holm was my assistant throughout surgery, and did aid in retraction, suctioning, and closure.  Phylliss Bob, MD

## 2018-08-15 NOTE — Progress Notes (Signed)
Patient taken to OR at this time. Report given to anesthetist/receiving staff. Patient in no signs of distress at the time of transfer.

## 2018-08-15 NOTE — Anesthesia Postprocedure Evaluation (Signed)
Anesthesia Post Note  Patient: Walter White.  Procedure(s) Performed: POSTERIOR SPINAL FUSION LUMBAR TWO-LUMBAR THREE WITH INSTRUMENTATION AND ALLOGRAFT (N/A Spine Lumbar)     Patient location during evaluation: PACU Anesthesia Type: General Level of consciousness: awake and alert and oriented Pain management: pain level controlled Vital Signs Assessment: post-procedure vital signs reviewed and stable Respiratory status: spontaneous breathing, nonlabored ventilation and respiratory function stable Cardiovascular status: blood pressure returned to baseline Postop Assessment: no apparent nausea or vomiting Anesthetic complications: no    Last Vitals:  Vitals:   08/15/18 1124 08/15/18 1159  BP:  113/74  Pulse: 89 71  Resp: 13 16  Temp: 36.9 C 36.8 C  SpO2: 95% 99%    Last Pain:  Vitals:   08/15/18 1159  TempSrc: Oral  PainSc:                  Brennan Bailey

## 2018-08-15 NOTE — H&P (Signed)
Patient tolerated stage 1 of his procedure well yesterday, and presents today for stage 2. Will proceed as planned (L2/3 PSF with instrumentation).

## 2018-08-15 NOTE — Transfer of Care (Signed)
Immediate Anesthesia Transfer of Care Note  Patient: Walter White.  Procedure(s) Performed: POSTERIOR SPINAL FUSION LUMBAR TWO-LUMBAR THREE WITH INSTRUMENTATION AND ALLOGRAFT (N/A Spine Lumbar)  Patient Location: PACU  Anesthesia Type:General  Level of Consciousness: drowsy  Airway & Oxygen Therapy: Patient Spontanous Breathing and Patient connected to face mask oxygen  Post-op Assessment: Report given to RN and Post -op Vital signs reviewed and stable  Post vital signs: Reviewed and stable  Last Vitals:  Vitals Value Taken Time  BP 96/61 08/15/18 1015  Temp    Pulse 103 08/15/18 1019  Resp 14 08/15/18 1019  SpO2 97 % 08/15/18 1019  Vitals shown include unvalidated device data.  Last Pain:  Vitals:   08/15/18 0449  TempSrc:   PainSc: Asleep      Patients Stated Pain Goal: 3 (69/45/03 8882)  Complications: No apparent anesthesia complications

## 2018-08-16 MED ORDER — DIAZEPAM 5 MG PO TABS: 5.0000 mg | ORAL_TABLET | Freq: Four times a day (QID) | ORAL | 0 refills | Status: DC | PRN

## 2018-08-16 MED ORDER — OXYCODONE-ACETAMINOPHEN 5-325 MG PO TABS: 1.0000 | ORAL_TABLET | ORAL | 0 refills | Status: DC | PRN

## 2018-08-16 NOTE — Progress Notes (Signed)
Patient is discharged from room 3C11 at this time. Alert but a little drowsy from pain medication earlier. Patient and spouse insisted on going home and that patient will sleep it off in his recliner and he is not comfortable in the hospital bed. IV site d/c'd and instructions read to patient and spouse with understanding verbalized. Left unit via wheelchair with all belongings at side.

## 2018-08-16 NOTE — Progress Notes (Signed)
    Patient doing well PO Day 1 S/P lumbar fusion. Leg pain is resolved, expected PO LBP well controlled with meds, TLSO brace at bedside. Pt eager to progress home   Physical Exam: Vitals:   08/16/18 0429 08/16/18 0741  BP: 117/74 108/61  Pulse: 93 (!) 107  Resp: 20 18  Temp: 100.1 F (37.8 C) 97.9 F (36.6 C)  SpO2: 91% 93%    Dressing in place, CDI, pt sitting up eating comfortably  NVI  POD #1 s/p L2-3 Lumbar fusion, resolved leg pain  - up with PT/OT, encourage ambulation - Percocet for pain, Valium for muscle spasms  -Sent electronically to pharmacy - likely d/c home today with f/u in 2 weeks

## 2018-08-16 NOTE — Evaluation (Addendum)
Occupational Therapy Evaluation Patient Details Name: Walter White. MRN: [id number] DOB: 07/28/1950 Today's Date: 08/16/2018    History of Present Illness 68 y/o male sp L2-3 lateral interbody fusion on 8/5 and s/p L2-3 Posterior spinal fusion on 8/6. PMH includes AAA, HTN, pre diabetes, L TKA, and back surgery.    Clinical Impression   Patient is s/p L2-3 lateral body fusion surgery resulting in functional limitations due to the deficits listed below (see OT problem list). Pt currently mod (A) for basic transfer hand held (A). Pt with decreased BP with symtpoms. Requesting RN hold discharge and OT re attempt patient later this afternoon.  Patient will benefit from skilled OT acutely to increase independence and safety with ADLS to allow discharge home with family (A).  Addendum: Revisiting unit 11:15 AM to arrange for second session with RN staff. OT informed that patient has discharged home with wife at this time. OT was unable to provide OT evaluation information due to discharge. BP was noted to be 71/71 (51) supine at 1000AM supine     Follow Up Recommendations  No OT follow up    Equipment Recommendations       Recommendations for Other Services       Precautions / Restrictions Precautions Precautions: Back Precaution Booklet Issued: Yes (comment) Precaution Comments: attempting to review precautions with patient but unable to remain aroused Required Braces or Orthoses: Spinal Brace Spinal Brace: Thoracolumbosacral orthotic;Applied in sitting position Restrictions Weight Bearing Restrictions: No      Mobility Bed Mobility Overal bed mobility: Needs Assistance Bed Mobility: Sit to Sidelying         Sit to sidelying: Min assist General bed mobility comments: pt able to place BIL LE in bed but needs (A) to control sit to side due to arousal  Transfers Overall transfer level: Needs assistance Equipment used: None Transfers: Sit to/from Stand Sit to Stand: Min  assist         General transfer comment: pt progressed to mOD (A) and focused attention    Balance Overall balance assessment: Needs assistance Sitting-balance support: Feet supported;Bilateral upper extremity supported Sitting balance-Leahy Scale: Poor Sitting balance - Comments: lateral lean in chair on arrival due to lethargic   Standing balance support: Single extremity supported Standing balance-Leahy Scale: Fair Standing balance comment: require support to balance                           ADL either performed or assessed with clinical judgement   ADL Overall ADL's : Needs assistance/impaired     Grooming: Maximal assistance   Upper Body Bathing: Maximal assistance   Lower Body Bathing: Maximal assistance   Upper Body Dressing : Maximal assistance   Lower Body Dressing: Maximal assistance   Toilet Transfer: Maximal assistance             General ADL Comments: pt leaning over in chair on arrival due to fatigue. pt falling back to sleep before OT could finish introduction. pt (A) from chair to bed at this time requiring mod (A) for transfer. Pt with vitals obtained. pt returned to supine until pt more aroused to resume.  Recommending pt does not discharge at this time despite wife on the way to pick up patient pt attempting to take brace off and holding strap then hand falling down and decreased arousal. Pt unable to sustain attention to complete task. Pt states "I know it " when therapist states "[name] you  cant stay awake for me" Rn Daphne in room at the time and reports patient will not discharge until after lunch. Ot expressing need for second session.      Vision Baseline Vision/History: Wears glasses Wears Glasses: Reading only       Perception     Praxis      Pertinent Vitals/Pain Pain Assessment: Faces Faces Pain Scale: Hurts a little bit Pain Location: back Pain Descriptors / Indicators: Operative site guarding Pain Intervention(s):  Monitored during session;Repositioned     Hand Dominance Right   Extremity/Trunk Assessment Upper Extremity Assessment Upper Extremity Assessment: Difficult to assess due to impaired cognition   Lower Extremity Assessment Lower Extremity Assessment: Defer to PT evaluation   Cervical / Trunk Assessment Cervical / Trunk Assessment: Other exceptions Cervical / Trunk Exceptions: s/p surg   Communication Communication Communication: No difficulties   Cognition Arousal/Alertness: Lethargic;Suspect due to medications Behavior During Therapy: [facility] for tasks assessed/performed Overall Cognitive Status: Impaired/Different from baseline Area of Impairment: Awareness                           Awareness: Emergent   General Comments: pt answering questions but unable to demonstrate more than focused attention. pt noted to have decreased BP and RN called to room to address concerns   General Comments  BP after standing now in sitting 93/48 (61) supine 51/33 (40) repeat BP supine with RN adjusting cuff 71/41 (51) pt lethargic and in safe positioning    Exercises     Shoulder Instructions      Home Living Family/patient expects to be discharged to:: Private residence Living Arrangements: Spouse/significant other Available Help at Discharge: Family;Available 24 hours/day Type of Home: House Home Access: Stairs to enter [employer] of Steps: 1 Entrance Stairs-Rails: None Home Layout: One level     Bathroom Shower/Tub: Walk-in shower;Tub/shower unit   Bathroom Toilet: Handicapped height Bathroom Accessibility: Yes How Accessible: Accessible via walker Home Equipment: [place] - 2 wheels;Bedside commode;Shower seat          Prior Functioning/Environment Level of Independence: Independent                 OT Problem List: Decreased strength;Decreased activity tolerance;Impaired balance (sitting and/or standing);Decreased cognition;Decreased safety  awareness;Decreased knowledge of use of DME or AE;Decreased knowledge of precautions;Cardiopulmonary status limiting activity;Obesity;Pain      OT Treatment/Interventions: Self-care/ADL training;Therapeutic exercise;Neuromuscular education;Energy conservation;DME and/or AE instruction;Manual therapy;Modalities;Therapeutic activities;Cognitive remediation/compensation;Patient/family education;Balance training    OT Goals(Current goals can be found in the care plan section) Acute Rehab OT Goals Patient Stated Goal: to wake up OT Goal Formulation: Patient unable to participate in goal setting Time For Goal Achievement: 08/23/18 Potential to Achieve Goals: Good  OT Frequency: Min 3X/week   Barriers to D/C:            Co-evaluation              AM-PAC OT "6 Clicks" Daily Activity     Outcome Measure Help from another person eating meals?: A Little Help from another person taking care of personal grooming?: A Lot Help from another person toileting, which includes using toliet, bedpan, or urinal?: A Lot Help from another person bathing (including washing, rinsing, drying)?: A Lot Help from another person to put on and taking off regular upper body clothing?: A Lot Help from another person to put on and taking off regular lower body clothing?: A Lot 6 Click Score: 13  End of Session Equipment Utilized During Treatment: Back brace Nurse Communication: Mobility status;Precautions;Weight bearing status  Activity Tolerance: Patient limited by lethargy;Patient limited by fatigue Patient left: in bed;with call bell/phone within reach;with nursing/sitter in room  OT Visit Diagnosis: Unsteadiness on feet (R26.81);Muscle weakness (generalized) (M62.81)                Time: 0917-1000 OT Time Calculation (min): 43 min Charges:  OT General Charges $OT Visit: 1 Visit OT Evaluation $OT Eval Moderate Complexity: 1 Mod OT Treatments $Self Care/Home Management : 8-22 mins   Jeri Modena,  OTR/L  Acute Rehabilitation Services Pager: [phone] Office: [phone] .   Jeri Modena 08/16/2018, 11:19 AM

## 2018-08-16 NOTE — Progress Notes (Signed)
Physical Therapy Treatment Patient Details Name: Walter White. MRN: [id number] DOB: May 16, 1950 Today's Date: 08/16/2018    History of Present Illness Pt is a 68 y/o male sp L2-3 lateral interbody fusion on 8/5 and s/p L2-3 Posterior spinal fusion on 8/6. PMH includes AAA, HTN, pre diabetes, L TKA, and back surgery.     PT Comments    Pt progressing well with post-op mobility. Continues to be a little unsteady with mobility without UE support, but has a cane and walker at home he can use if needbe. Grossly min guard with OOB mobility. Pt was educated on precautions, brace application/wearing schedule, car transfer, and appropriate activity progression. Will continue to follow and progress as able per POC.     Follow Up Recommendations  No PT follow up;Supervision for mobility/OOB     Equipment Recommendations  None recommended by PT    Recommendations for Other Services       Precautions / Restrictions Precautions Precautions: Back Precaution Booklet Issued: Yes (comment) Precaution Comments: Reviewed back precautions with pt Required Braces or Orthoses: Spinal Brace Spinal Brace: Thoracolumbosacral orthotic;Applied in sitting position Restrictions Weight Bearing Restrictions: No    Mobility  Bed Mobility Overal bed mobility: Needs Assistance Bed Mobility: Supine to Sit           General bed mobility comments: Pt exited bed with HOB elevated as he typically sleeps in recliner at home.   Transfers Overall transfer level: Needs assistance Equipment used: None Transfers: Sit to/from Stand Sit to Stand: Min guard         General transfer comment: Min guard for steadying assist.   Ambulation/Gait Ambulation/Gait assistance: Min assist;Min guard Gait Distance (Feet): 250 Feet Assistive device: None Gait Pattern/deviations: Step-through pattern;Decreased stride length Gait velocity: Decreased  Gait velocity interpretation: <1.8 ft/sec, indicate of risk for  recurrent falls General Gait Details: Min guard to occasional min A for steadying assist throughout gait. Pt with mild LOB X2-3 throughout gait. Fairly significant lateral sway favoring L side during ambulation.    Stairs Stairs: Yes Stairs assistance: Min assist Stair Management: No rails;Step to pattern;Forwards(With HHA) Number of Stairs: 8 General stair comments: VC's for sequencing and general safety with stair negotiation. Pt unsteady without railing use and with poor posture. Pt able to make corrective changes but unable to maintain without further cueing. Therapist close with hands-on support to simulate home environment without railing.   Wheelchair Mobility    Modified Rankin (Stroke Patients Only)       Balance Overall balance assessment: Needs assistance Sitting-balance support: No upper extremity supported;Feet supported Sitting balance-Leahy Scale: Good     Standing balance support: No upper extremity supported;During functional activity Standing balance-Leahy Scale: Fair Standing balance comment: no UE support                             Cognition Arousal/Alertness: Awake/alert Behavior During Therapy: WFL for tasks assessed/performed Overall Cognitive Status: Within Functional Limits for tasks assessed                                        Exercises      General Comments        Pertinent Vitals/Pain Pain Assessment: Faces Faces Pain Scale: Hurts a little bit Pain Location: back Pain Descriptors / Indicators: Operative site guarding Pain Intervention(s): Monitored during session;Repositioned  Home Living                      Prior Function            PT Goals (current goals can now be found in the care plan section) Acute Rehab PT Goals Patient Stated Goal: to go home PT Goal Formulation: With patient Time For Goal Achievement: 08/29/18 Potential to Achieve Goals: Good Progress towards PT goals:  Progressing toward goals    Frequency    Min 5X/week      PT Plan Current plan remains appropriate    Co-evaluation              AM-PAC PT "6 Clicks" Mobility   Outcome Measure  Help needed turning from your back to your side while in a flat bed without using bedrails?: A Little Help needed moving from lying on your back to sitting on the side of a flat bed without using bedrails?: A Little Help needed moving to and from a bed to a chair (including a wheelchair)?: A Little Help needed standing up from a chair using your arms (e.g., wheelchair or bedside chair)?: A Little Help needed to walk in hospital room?: A Little Help needed climbing 3-5 steps with a railing? : A Lot 6 Click Score: 17    End of Session Equipment Utilized During Treatment: Gait belt;Back brace Activity Tolerance: Patient tolerated treatment well Patient left: in chair;with call bell/phone within reach Nurse Communication: Mobility status PT Visit Diagnosis: Unsteadiness on feet (R26.81);Muscle weakness (generalized) (M62.81)     Time: 4008-6761 PT Time Calculation (min) (ACUTE ONLY): 25 min  Charges:  $Gait Training: 23-37 mins                     Rolinda Roan, PT, DPT Acute Rehabilitation Services Pager: [phone] Office: [place] 08/16/2018, 9:12 AM

## 2018-08-17 DIAGNOSIS — D472 Monoclonal gammopathy: Secondary | ICD-10-CM | POA: Diagnosis not present

## 2018-08-17 DIAGNOSIS — F329 Major depressive disorder, single episode, unspecified: Secondary | ICD-10-CM | POA: Diagnosis not present

## 2018-08-17 DIAGNOSIS — G5622 Lesion of ulnar nerve, left upper limb: Secondary | ICD-10-CM | POA: Diagnosis not present

## 2018-08-17 DIAGNOSIS — J329 Chronic sinusitis, unspecified: Secondary | ICD-10-CM | POA: Diagnosis not present

## 2018-08-17 DIAGNOSIS — M419 Scoliosis, unspecified: Secondary | ICD-10-CM | POA: Diagnosis not present

## 2018-08-17 DIAGNOSIS — I773 Arterial fibromuscular dysplasia: Secondary | ICD-10-CM | POA: Diagnosis not present

## 2018-08-17 DIAGNOSIS — G629 Polyneuropathy, unspecified: Secondary | ICD-10-CM | POA: Diagnosis not present

## 2018-08-17 DIAGNOSIS — F419 Anxiety disorder, unspecified: Secondary | ICD-10-CM | POA: Diagnosis not present

## 2018-08-17 DIAGNOSIS — M4802 Spinal stenosis, cervical region: Secondary | ICD-10-CM | POA: Diagnosis not present

## 2018-08-17 DIAGNOSIS — Z4789 Encounter for other orthopedic aftercare: Secondary | ICD-10-CM | POA: Diagnosis not present

## 2018-08-17 DIAGNOSIS — M5416 Radiculopathy, lumbar region: Secondary | ICD-10-CM | POA: Diagnosis not present

## 2018-08-17 DIAGNOSIS — M48062 Spinal stenosis, lumbar region with neurogenic claudication: Secondary | ICD-10-CM | POA: Diagnosis not present

## 2018-08-17 DIAGNOSIS — M1712 Unilateral primary osteoarthritis, left knee: Secondary | ICD-10-CM | POA: Diagnosis not present

## 2018-08-17 DIAGNOSIS — E785 Hyperlipidemia, unspecified: Secondary | ICD-10-CM | POA: Diagnosis not present

## 2018-08-20 DIAGNOSIS — Z4789 Encounter for other orthopedic aftercare: Secondary | ICD-10-CM | POA: Diagnosis not present

## 2018-08-20 DIAGNOSIS — M48062 Spinal stenosis, lumbar region with neurogenic claudication: Secondary | ICD-10-CM | POA: Diagnosis not present

## 2018-08-20 DIAGNOSIS — M5416 Radiculopathy, lumbar region: Secondary | ICD-10-CM | POA: Diagnosis not present

## 2018-08-20 DIAGNOSIS — G629 Polyneuropathy, unspecified: Secondary | ICD-10-CM | POA: Diagnosis not present

## 2018-08-20 DIAGNOSIS — M4802 Spinal stenosis, cervical region: Secondary | ICD-10-CM | POA: Diagnosis not present

## 2018-08-20 DIAGNOSIS — M1712 Unilateral primary osteoarthritis, left knee: Secondary | ICD-10-CM | POA: Diagnosis not present

## 2018-08-22 DIAGNOSIS — M1712 Unilateral primary osteoarthritis, left knee: Secondary | ICD-10-CM | POA: Diagnosis not present

## 2018-08-22 DIAGNOSIS — Z4789 Encounter for other orthopedic aftercare: Secondary | ICD-10-CM | POA: Diagnosis not present

## 2018-08-22 DIAGNOSIS — M5416 Radiculopathy, lumbar region: Secondary | ICD-10-CM | POA: Diagnosis not present

## 2018-08-22 DIAGNOSIS — G629 Polyneuropathy, unspecified: Secondary | ICD-10-CM | POA: Diagnosis not present

## 2018-08-22 DIAGNOSIS — M4802 Spinal stenosis, cervical region: Secondary | ICD-10-CM | POA: Diagnosis not present

## 2018-08-22 DIAGNOSIS — M48062 Spinal stenosis, lumbar region with neurogenic claudication: Secondary | ICD-10-CM | POA: Diagnosis not present

## 2018-08-28 DIAGNOSIS — G629 Polyneuropathy, unspecified: Secondary | ICD-10-CM | POA: Diagnosis not present

## 2018-08-28 DIAGNOSIS — M1712 Unilateral primary osteoarthritis, left knee: Secondary | ICD-10-CM | POA: Diagnosis not present

## 2018-08-28 DIAGNOSIS — M4802 Spinal stenosis, cervical region: Secondary | ICD-10-CM | POA: Diagnosis not present

## 2018-08-28 DIAGNOSIS — M5416 Radiculopathy, lumbar region: Secondary | ICD-10-CM | POA: Diagnosis not present

## 2018-08-28 DIAGNOSIS — M48062 Spinal stenosis, lumbar region with neurogenic claudication: Secondary | ICD-10-CM | POA: Diagnosis not present

## 2018-08-28 DIAGNOSIS — Z4789 Encounter for other orthopedic aftercare: Secondary | ICD-10-CM | POA: Diagnosis not present

## 2018-08-30 DIAGNOSIS — Z4789 Encounter for other orthopedic aftercare: Secondary | ICD-10-CM | POA: Diagnosis not present

## 2018-08-30 DIAGNOSIS — Z9889 Other specified postprocedural states: Secondary | ICD-10-CM | POA: Diagnosis not present

## 2018-08-30 DIAGNOSIS — M1712 Unilateral primary osteoarthritis, left knee: Secondary | ICD-10-CM | POA: Diagnosis not present

## 2018-08-30 DIAGNOSIS — G629 Polyneuropathy, unspecified: Secondary | ICD-10-CM | POA: Diagnosis not present

## 2018-08-30 DIAGNOSIS — M5416 Radiculopathy, lumbar region: Secondary | ICD-10-CM | POA: Diagnosis not present

## 2018-08-30 DIAGNOSIS — M4802 Spinal stenosis, cervical region: Secondary | ICD-10-CM | POA: Diagnosis not present

## 2018-08-30 DIAGNOSIS — M48062 Spinal stenosis, lumbar region with neurogenic claudication: Secondary | ICD-10-CM | POA: Diagnosis not present

## 2018-09-03 DIAGNOSIS — G629 Polyneuropathy, unspecified: Secondary | ICD-10-CM | POA: Diagnosis not present

## 2018-09-03 DIAGNOSIS — M1712 Unilateral primary osteoarthritis, left knee: Secondary | ICD-10-CM | POA: Diagnosis not present

## 2018-09-03 DIAGNOSIS — Z4789 Encounter for other orthopedic aftercare: Secondary | ICD-10-CM | POA: Diagnosis not present

## 2018-09-03 DIAGNOSIS — M4802 Spinal stenosis, cervical region: Secondary | ICD-10-CM | POA: Diagnosis not present

## 2018-09-03 DIAGNOSIS — M5416 Radiculopathy, lumbar region: Secondary | ICD-10-CM | POA: Diagnosis not present

## 2018-09-03 DIAGNOSIS — M48062 Spinal stenosis, lumbar region with neurogenic claudication: Secondary | ICD-10-CM | POA: Diagnosis not present

## 2018-09-04 NOTE — Discharge Summary (Signed)
Patient ID: Walter White. MRN: [id number] DOB/AGE: 01/27/1950 68 y.o.  Admit date: 08/14/2018 Discharge date: 08/16/2018  Admission Diagnoses:  Active Problems:   Radiculopathy   Discharge Diagnoses:  Same  Past Medical History:  Diagnosis Date  . AAA (abdominal aortic aneurysm) (HCC)    4.0 cm AAA 08/22/17 U/S  . Allergy    Allergic Rhinitis  . Arthritis    spinal steniosis  . Benign prostate hyperplasia    with lower urinary tract symptoms  . Carpal tunnel syndrome on right   . Cataracts, both eyes   . Chronic back pain    caused by spinal stenosis  . Elevated PSA   . GERD (gastroesophageal reflux disease)   . Hyperlipidemia   . Hypertension    Essential.    . MGUS (monoclonal gammopathy of unknown significance)   . Microhematuria   . OA (osteoarthritis) of knee   . Pneumonia    Hx -when very young  . Pre-diabetes   . Seasonal allergies   . Spinal stenosis    of lumbosacral region.  Causes chronic back pain.  [name] Upper abdominal pain     Surgeries: Procedure(s): POSTERIOR SPINAL FUSION LUMBAR TWO-LUMBAR THREE WITH INSTRUMENTATION AND ALLOGRAFT on 08/15/2018   Consultants: None  Discharged Condition: Improved  Hospital Course: Walter White. is an 68 y.o. male who was admitted 08/14/2018 for operative treatment of radiculopathy. Patient has severe unremitting pain that affects sleep, daily activities, and work/hobbies. After pre-op clearance the patient was taken to the operating room on 08/15/2018 and underwent  Procedure(s): POSTERIOR SPINAL FUSION LUMBAR TWO-LUMBAR THREE WITH INSTRUMENTATION AND ALLOGRAFT.    Patient was given perioperative antibiotics:  Anti-infectives (From admission, onward)   Start     Dose/Rate Route Frequency Ordered Stop   08/14/18 2000  vancomycin (VANCOCIN) IVPB 1000 mg/200 mL premix     1,000 mg 200 mL/hr over 60 Minutes Intravenous  Once 08/14/18 1548 08/15/18 1234   08/14/18 0630  vancomycin (VANCOCIN) 1,500 mg in  sodium chloride 0.9 % 500 mL IVPB     1,500 mg 250 mL/hr over 120 Minutes Intravenous To [facility]Stay [facility] 08/13/18 1240 08/14/18 0948       Patient was given sequential compression devices, early ambulation to prevent DVT.  Patient benefited maximally from hospital stay and there were no complications.    Recent vital signs: BP 108/61 (BP Location: Right Arm)   Pulse (!) 107   Temp 97.9 F (36.6 C) (Oral)   Resp 18   Ht 5\' 7"  (1.702 m)   Wt 106.6 kg   SpO2 93%   BMI 36.81 kg/m    Discharge Medications:   Allergies as of 08/16/2018      Reactions   Pork-derived Products Shortness Of Breath   Shellfish Allergy Shortness Of Breath   Lovastatin Other (See Comments)   Bone pain    Penicillins    UNSPECIFIED CHILDHOOD REACTION  Has patient had a PCN reaction causing immediate rash, facial/tongue/throat swelling, SOB or lightheadedness with hypotension: Unknown Has patient had a PCN reaction causing severe rash involving mucus membranes or skin necrosis: Unknown Has patient had a PCN reaction that required hospitalization: Unknown Has patient had a PCN reaction occurring within the last 10 years: No If all of the above answers are "NO", then may proceed with Cephalosporin use.      Medication List    STOP taking these medications   gabapentin 600 MG tablet Commonly known as: NEURONTIN  HYDROcodone-acetaminophen 5-325 MG tablet Commonly known as: NORCO/VICODIN     TAKE these medications   diazepam 5 MG tablet Commonly known as: VALIUM Take 1 tablet (5 mg total) by mouth every 6 (six) hours as needed for muscle spasms.   docusate sodium 100 MG capsule Commonly known as: COLACE Take 100 mg by mouth daily.   JUICE PLUS FIBRE PO Take 6 capsules by mouth daily. Juice Plus+ [place]chard Blend 2 capsules in the morning Juice Plus+ Garden Blend 2 capsules in the morning Juice Plus+ Vineyard Blend 2 capsule in the morning   lisinopril-hydrochlorothiazide 20-25 MG tablet  Commonly known as: ZESTORETIC Take 1 tablet by mouth daily.   MAGNESIUM CITRATE PO Take 750 mg by mouth daily.   methocarbamol 750 MG tablet Commonly known as: ROBAXIN Take 750 mg by mouth 2 (two) times a day.   oxyCODONE-acetaminophen 5-325 MG tablet Commonly known as: PERCOCET/ROXICET Take 1-2 tablets by mouth every 4 (four) hours as needed for moderate pain or severe pain.   pantoprazole 40 MG tablet Commonly known as: PROTONIX Take 40 mg by mouth daily.   rosuvastatin 5 MG tablet Commonly known as: CRESTOR Take 5 mg by mouth daily.   tamsulosin 0.4 MG Caps capsule Commonly known as: FLOMAX Take 0.4 mg by mouth at bedtime.   VISINE OP Place 1 drop into both eyes daily as needed (irritation).       Diagnostic Studies: Dg Lumbar Spine 2-3 Views  Result Date: 08/15/2018 CLINICAL DATA:  Intraop posterior spinal fixation EXAM: LUMBAR SPINE - 2-3 VIEW; DG C-ARM 61-120 MIN COMPARISON:  None. FINDINGS: Three intraoperative fluoroscopic guided images were obtained for posterior spinal fixation. There is additional L2-L3 spinal rod fixation seen. Fluoro time 152 seconds. IMPRESSION: Three fluoroscopic guided images from additional fixation at L2-L3. Electronically Signed   By: Prudencio Pair M.D.   On: 08/15/2018 10:12   Dg Lumbar Spine 2-3 Views  Result Date: 08/14/2018 CLINICAL DATA:  L2-3 X lif EXAM: LUMBAR SPINE - 2-3 VIEW; DG C-ARM 61-120 MIN COMPARISON:  Lumbar MRI 01/15/2018 FINDINGS: Two intraprocedural images show Xlif hardware above a pre-existing subjacent fusion. No unexpected finding. IMPRESSION: Fluoroscopy for L2-3 fusion. Electronically Signed   By: Monte Fantasia M.D.   On: 08/14/2018 11:25   Dg C-arm 1-60 Min  Result Date: 08/15/2018 CLINICAL DATA:  Intraop posterior spinal fixation EXAM: LUMBAR SPINE - 2-3 VIEW; DG C-ARM 61-120 MIN COMPARISON:  None. FINDINGS: Three intraoperative fluoroscopic guided images were obtained for posterior spinal fixation. There is  additional L2-L3 spinal rod fixation seen. Fluoro time 152 seconds. IMPRESSION: Three fluoroscopic guided images from additional fixation at L2-L3. Electronically Signed   By: Prudencio Pair M.D.   On: 08/15/2018 10:12   Dg C-arm 1-60 Min  Result Date: 08/14/2018 CLINICAL DATA:  L2-3 X lif EXAM: LUMBAR SPINE - 2-3 VIEW; DG C-ARM 61-120 MIN COMPARISON:  Lumbar MRI 01/15/2018 FINDINGS: Two intraprocedural images show Xlif hardware above a pre-existing subjacent fusion. No unexpected finding. IMPRESSION: Fluoroscopy for L2-3 fusion. Electronically Signed   By: Monte Fantasia M.D.   On: 08/14/2018 11:25    Disposition:    POD #1 s/p L2-3 Lumbar fusion, resolved leg pain  - up with PT/OT, encourage ambulation - Percocet for pain, Valium for muscle spasms             -Sent electronically to pharmacy - likely d/c home today with f/u in 2 weeks   Signed: Lennie Muckle Josecarlos Harriott 09/04/2018, 9:09 AM

## 2018-09-23 DIAGNOSIS — Z23 Encounter for immunization: Secondary | ICD-10-CM | POA: Diagnosis not present

## 2018-09-27 DIAGNOSIS — Z9889 Other specified postprocedural states: Secondary | ICD-10-CM | POA: Diagnosis not present

## 2018-09-27 DIAGNOSIS — M5416 Radiculopathy, lumbar region: Secondary | ICD-10-CM | POA: Diagnosis not present

## 2018-09-29 NOTE — Progress Notes (Signed)
[place]  [phone] [phone]    ID: Walter White. DOB: Nov 02, 1950  MR#: [id number]  FBP#:[id number]  Patient Care Team: Deland Pretty, MD as PCP - General (Internal Medicine) Kathie Rhodes, MD as Consulting Physician (Urology) Frederik Pear, MD as Consulting Physician (Orthopedic Surgery) Phylliss Bob, MD as Consulting Physician (Orthopedic Surgery) Magrinat, Virgie Dad, MD as Consulting Physician (Hematology and Oncology) OTHER MD:  [phone] 2020: Phone visit   CHIEF COMPLAINT: M-GUS  CURRENT TREATMENT: observation   HISTORY OF CURRENT ILLNESS: Walter White. is followed by Dr. Shelia Media, who noted a high total protein at 8.8 with an albumin of 4.0.  An SPEP was obtained on 03/05/2018, showing an M spike of 2.3.  Immunofixation was not performed.  The patient was also found to be mildly anemic, and to have a slightly low white cell count.  Given these findings he is being referred for evaluation of plasma cell dyscrasia, possibly myeloma.  The patient's subsequent history is as detailed below.   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 He was last seen here on 04/09/2018.   He continues under observation.  The most important event since his last visit here of course was his posterior spinal fusion which he under went August 15, 2018 under [facility]  He tells me he did generally well with the procedure.  He was wearing a full brace but now only a partial, and hopes that can come off in a couple of weeks so he can exercise and rehab more fully.  Right now he still very restricted  Since his last visit here, he underwent a bone scan on 06/21/2018 showing: LEFT knee prosthesis without scintigraphic evidence of aseptic loosening or infection. Mild degenerative type uptake at RIGHT knee.  He also underwent a lumbar spine and C-arm xray on 08/14/2018 showing: Two intraprocedural images show Xlif  hardware above a pre-existing subjacent fusion. No unexpected finding.  REVIEW OF SYSTEMS: Walter White is using hydrocodone for the pain at night and some Tylenol in the morning.  He is not using nonsteroidals.  He says they tend to upset his stomach.  He is not significantly constipated from the hydrocodone.  He has had no intercurrent fevers, rash, or bleeding problems.  Weight has been stable.  Overall aside from the intervening surgery a detailed review of systems is noncontributory   PAST MEDICAL HISTORY: Past Medical History:  Diagnosis Date  . AAA (abdominal aortic aneurysm) (HCC)    4.0 cm AAA 08/22/17 U/S  . Allergy    Allergic Rhinitis  . Arthritis    spinal steniosis  . Benign prostate hyperplasia    with lower urinary tract symptoms  . Carpal tunnel syndrome on right   . Cataracts, both eyes   . Chronic back pain    caused by spinal stenosis  . Elevated PSA   . GERD (gastroesophageal reflux disease)   . Hyperlipidemia   . Hypertension    Essential.    . MGUS (monoclonal gammopathy of unknown significance)   . Microhematuria   . OA (osteoarthritis) of knee   . Pneumonia    Hx -when very young  . Pre-diabetes   . Seasonal allergies   . Spinal stenosis    of lumbosacral region.  Causes chronic back pain.  [name] Upper abdominal pain      PAST SURGICAL HISTORY: Past Surgical History:  Procedure Laterality Date  . ABDOMINAL EXPOSURE N/A 09/05/2017  Procedure: ABDOMINAL EXPOSURE;  Surgeon: Rosetta Posner, MD;  Location: [facility];  Service: Vascular;  Laterality: N/A;  . ANTERIOR LAT LUMBAR FUSION Left 01/26/2016   Procedure: LEFT SIDED LATERAL INTERBODY FUSION, LUMBAR 3-4 WITH INSTRUMENTATION AND ALLOGRAFT;  Surgeon: Phylliss Bob, MD;  Location: [place];  Service: Orthopedics;  Laterality: Left;  LEFT SIDED LATERAL INTERBODY FUSION, LUMBAR 3-4 WITH INSTRUMENTATION AND ALLOGRAFT  . ANTERIOR LAT LUMBAR FUSION Right 08/14/2018   Procedure: RIGHT LUMBAR two to LUMBAR three LATERAL  INTERBODY FUSION WITH INSTRUMENTATION AND ALLOGRAFT;  Surgeon: Phylliss Bob, MD;  Location: [place];  Service: Orthopedics;  Laterality: Right;  . ANTERIOR LATERAL LUMBAR FUSION WITH PERCUTANEOUS SCREW 1 LEVEL N/A 09/05/2017   Procedure: LUMBAR 5 - SACRUM 1 ANTERIOR LUMBAR INTERBODY FUSION WITH INSTRUMENTATION AND ALLOGRAFT;  Surgeon: Phylliss Bob, MD;  Location: [place];  Service: Orthopedics;  Laterality: N/A;  . BACK SURGERY  01/2015   dr. Lynann Bologna. [phone].  [name] CARPAL TUNNEL RELEASE     right   . CATARACT EXTRACTION, BILATERAL    . COLONOSCOPY    . COLONOSCOPY  2010   polyp  . ELBOW SURGERY     right ulnar release  . ELBOW SURGERY     LEFT CTR  . JOINT REPLACEMENT     left- Sept 2016  . KNEE ARTHROSCOPY  11/23/2010   Procedure: ARTHROSCOPY KNEE;  Surgeon: Kerin Salen;  Location: [place];  Service: Orthopedics;  Laterality: Left;  arthroscopy left knee  . KNEE ARTHROSCOPY     left x 2  . Knee scar tissue removal Left 2018  . LATERAL FUSION LUMBAR SPINE  01./17/2018  . polypectomy  2010  . SHOULDER ARTHROSCOPY Left    x2  . TONSILLECTOMY    . TOTAL KNEE ARTHROPLASTY Left 09/21/2014   Procedure: TOTAL KNEE ARTHROPLASTY;  Surgeon: Frederik Pear, MD;  Location: [place];  Service: Orthopedics;  Laterality: Left;  . ULNAR NERVE TRANSPOSITION Left 04/16/2017   Procedure: LEFT ULNAR NEUROPLASTY AT THE WRIST AND TRANSPOSITION AT THE ELBOW;  Surgeon: Milly Jakob, MD;  Location: [place];  Service: Orthopedics;  Laterality: Left;  . WISDOM TOOTH EXTRACTION       FAMILY HISTORY: Family History  Problem Relation Age of Onset  . Cancer Mother   . Liver disease Father   . Colon cancer Neg Hx   . Colon polyps Neg Hx   . Esophageal cancer Neg Hx   . Rectal cancer Neg Hx   . Stomach cancer Neg Hx    Walter White's father died from heart failure at age 63. Patients' mother died from lung cancer at age 26. The patient has 1 sister, who is deceased due to a  motor vehicle accident. Patient denies anyone in her family having breast, ovarian, prostate, or pancreatic cancer.    SOCIAL HISTORY:  Walter White is retired from the [employer]. His wife, [name], is a retired Pharmacist, hospital. Walter White's son Walter White graduated with his PhD in History, but he currently works at [employer] in [employer]. Walter White's other son Walter White, is a Counsellor for [employer] and coaches football at [employer]. Anhad has two grandchildren. He attends [employer].    ADVANCED DIRECTIVES: Walter White's wife, [name], is automatically his healthcare power of attorney.     HEALTH MAINTENANCE: Social History   Tobacco Use  . Smoking status: Former Smoker    Quit date: 11/21/1980    Years since quitting: 37.8  .  Smokeless tobacco: Never Used  Substance Use Topics  . Alcohol use: Yes    Alcohol/week: 0.0 standard drinks    Comment: Very rare  . Drug use: No    Colonoscopy: due 04/2018  Bone density:    Allergies  Allergen Reactions  . Pork-Derived Products Shortness Of Breath  . Shellfish Allergy Shortness Of Breath  . Lovastatin Other (See Comments)    Bone pain   . Penicillins     UNSPECIFIED CHILDHOOD REACTION  Has patient had a PCN reaction causing immediate rash, facial/tongue/throat swelling, SOB or lightheadedness with hypotension: Unknown Has patient had a PCN reaction causing severe rash involving mucus membranes or skin necrosis: Unknown Has patient had a PCN reaction that required hospitalization: Unknown Has patient had a PCN reaction occurring within the last 10 years: No If all of the above answers are "NO", then may proceed with Cephalosporin use.     Current Outpatient Medications  Medication Sig Dispense Refill  . diazepam (VALIUM) 5 MG tablet Take 1 tablet (5 mg total) by mouth every 6 (six) hours as needed for muscle spasms. 30 tablet 0  . docusate sodium (COLACE) 100 MG capsule Take 100 mg by mouth  daily.     [name] lisinopril-hydrochlorothiazide (PRINZIDE,ZESTORETIC) 20-25 MG tablet Take 1 tablet by mouth daily.    [name] MAGNESIUM CITRATE PO Take 750 mg by mouth daily.    . methocarbamol (ROBAXIN) 750 MG tablet Take 750 mg by mouth 2 (two) times a day.    . Nutritional Supplements (JUICE PLUS FIBRE PO) Take 6 capsules by mouth daily. Juice Plus+ Orchard Blend 2 capsules in the morning Juice Plus+ Garden Blend 2 capsules in the morning Juice Plus+ Vineyard Blend 2 capsule in the morning    . oxyCODONE-acetaminophen (PERCOCET/ROXICET) 5-325 MG tablet Take 1-2 tablets by mouth every 4 (four) hours as needed for moderate pain or severe pain. 30 tablet 0  . pantoprazole (PROTONIX) 40 MG tablet Take 40 mg by mouth daily.    . rosuvastatin (CRESTOR) 5 MG tablet Take 5 mg by mouth daily.    . Tamsulosin HCl (FLOMAX) 0.4 MG CAPS Take 0.4 mg by mouth at bedtime.     . Tetrahydrozoline HCl (VISINE OP) Place 1 drop into both eyes daily as needed (irritation).     No current facility-administered medications for this visit.      OBJECTIVE: Older white man who appears stated age  68:   09/30/18 1454  BP: (!) 146/92  Pulse: 66  Resp: 18  Temp: 98.2 F (36.8 C)  SpO2: 99%   Wt Readings from Last 3 Encounters:  09/30/18 239 lb 1.6 oz (108.5 kg)  08/14/18 235 lb (106.6 kg)  08/07/18 244 lb 3 oz (110.8 kg)   Body mass index is 37.45 kg/m.    ECOG FS:1 - Symptomatic but completely ambulatory  Ocular: Sclerae unicteric, pupils round and equal Ear-nose-throat: Wearing a mask Lymphatic: No cervical or supraclavicular adenopathy Lungs no rales or rhonchi Heart regular rate and rhythm Abd soft, nontender, positive bowel sounds MSK brace in place Neuro: non-focal, well-oriented, appropriate affect   LAB RESULTS:  CMP     Component Value Date/Time   NA 138 09/30/2018 1435   K 3.9 09/30/2018 1435   CL 103 09/30/2018 1435   CO2 27 09/30/2018 1435   GLUCOSE 114 (H) 09/30/2018 1435   BUN  17 09/30/2018 1435   CREATININE 0.94 09/30/2018 1435   CREATININE 1.01 03/19/2018 1047   CALCIUM 9.7 09/30/2018  1435   PROT 8.7 (H) 09/30/2018 1435   ALBUMIN 4.0 09/30/2018 1435   AST 41 09/30/2018 1435   AST 30 03/19/2018 1047   ALT 26 09/30/2018 1435   ALT 26 03/19/2018 1047   ALKPHOS 94 09/30/2018 1435   BILITOT 0.5 09/30/2018 1435   BILITOT 0.5 03/19/2018 1047   GFRNONAA >60 09/30/2018 1435   GFRNONAA >60 03/19/2018 1047   GFRAA >60 09/30/2018 1435   GFRAA >60 03/19/2018 1047    Lab Results  Component Value Date   TOTALPROTELP 8.3 03/19/2018     Lab Results  Component Value Date   KPAFRELGTCHN 32.8 (H) 03/19/2018   LAMBDASER 9.8 03/19/2018   KAPLAMBRATIO 3.35 (H) 03/19/2018    Lab Results  Component Value Date   WBC 4.2 09/30/2018   NEUTROABS 2.4 09/30/2018   HGB 11.4 (L) 09/30/2018   HCT 33.5 (L) 09/30/2018   MCV 92.3 09/30/2018   PLT 179 09/30/2018    '@LASTCHEMISTRY'$ @  No results found for: LABCA2  No components found for: [id number]  No results for input(s): INR in the last 168 hours.  No results found for: LABCA2  No results found for: ZHG992  No results found for: EQA834  No results found for: HDQ222  No results found for: CA2729  No components found for: HGQUANT  No results found for: CEA1 / No results found for: CEA1   No results found for: AFPTUMOR  No results found for: CHROMOGRNA  No results found for: PSA1  Appointment on 09/30/2018  Component Date Value Ref Range Status  . Sodium 09/30/2018 138  135 - 145 mmol/L Final  . Potassium 09/30/2018 3.9  3.5 - 5.1 mmol/L Final  . Chloride 09/30/2018 103  98 - 111 mmol/L Final  . CO2 09/30/2018 27  22 - 32 mmol/L Final  . Glucose, Bld 09/30/2018 114* 70 - 99 mg/dL Final  . BUN 09/30/2018 17  8 - 23 mg/dL Final  . Creatinine, Ser 09/30/2018 0.94  0.61 - 1.24 mg/dL Final  . Calcium 09/30/2018 9.7  8.9 - 10.3 mg/dL Final  . Total Protein 09/30/2018 8.7* 6.5 - 8.1 g/dL Final  . Albumin  09/30/2018 4.0  3.5 - 5.0 g/dL Final  . AST 09/30/2018 41  15 - 41 U/L Final  . ALT 09/30/2018 26  0 - 44 U/L Final  . Alkaline Phosphatase 09/30/2018 94  38 - 126 U/L Final  . Total Bilirubin 09/30/2018 0.5  0.3 - 1.2 mg/dL Final  . GFR calc non Af Amer 09/30/2018 >60  >60 mL/min Final  . GFR calc Af Amer 09/30/2018 >60  >60 mL/min Final  . Anion gap 09/30/2018 8  5 - 15 Final   Performed at [facility], [place] [address] [place], [place] [address]  . WBC 09/30/2018 4.2  4.0 - 10.5 K/uL Final  . RBC 09/30/2018 3.63* 4.22 - 5.81 MIL/uL Final  . Hemoglobin 09/30/2018 11.4* 13.0 - 17.0 g/dL Final  . HCT 09/30/2018 33.5* 39.0 - 52.0 % Final  . MCV 09/30/2018 92.3  80.0 - 100.0 fL Final  . MCH 09/30/2018 31.4  26.0 - 34.0 pg Final  . MCHC 09/30/2018 34.0  30.0 - 36.0 g/dL Final  . RDW 09/30/2018 13.2  11.5 - 15.5 % Final  . Platelets 09/30/2018 179  150 - 400 K/uL Final  . nRBC 09/30/2018 0.0  0.0 - 0.2 % Final  . Neutrophils Relative % 09/30/2018 59  % Final  . Neutro Abs 09/30/2018 2.4  1.7 - 7.7 K/uL Final  . Lymphocytes Relative 09/30/2018 29  % Final  . Lymphs Abs 09/30/2018 1.2  0.7 - 4.0 K/uL Final  . Monocytes Relative 09/30/2018 10  % Final  . Monocytes Absolute 09/30/2018 0.4  0.1 - 1.0 K/uL Final  . Eosinophils Relative 09/30/2018 1  % Final  . Eosinophils Absolute 09/30/2018 0.1  0.0 - 0.5 K/uL Final  . Basophils Relative 09/30/2018 1  % Final  . Basophils Absolute 09/30/2018 0.0  0.0 - 0.1 K/uL Final  . Immature Granulocytes 09/30/2018 0  % Final  . Abs Immature Granulocytes 09/30/2018 0.01  0.00 - 0.07 K/uL Final   Performed at [facility], [address] [place], [place] [address]    (this displays the last labs from the last 3 days)  Lab Results  Component Value Date   TOTALPROTELP 8.3 03/19/2018   (this displays SPEP labs)  Lab Results  Component Value Date   KPAFRELGTCHN 32.8 (H) 03/19/2018   LAMBDASER 9.8  03/19/2018   KAPLAMBRATIO 3.35 (H) 03/19/2018   (kappa/lambda light chains)  No results found for: HGBA, HGBA2QUANT, HGBFQUANT, HGBSQUAN (Hemoglobinopathy evaluation)   No results found for: LDH  No results found for: IRON, TIBC, IRONPCTSAT (Iron and TIBC)  No results found for: FERRITIN  Urinalysis    Component Value Date/Time   COLORURINE YELLOW 08/07/2018 0944   APPEARANCEUR CLEAR 08/07/2018 0944   LABSPEC 1.019 08/07/2018 0944   PHURINE 5.0 08/07/2018 [place] 08/07/2018 [place] 08/07/2018 0944   BILIRUBINUR NEGATIVE 08/07/2018 0944   KETONESUR NEGATIVE 08/07/2018 0944   PROTEINUR NEGATIVE 08/07/2018 0944   UROBILINOGEN 0.2 09/10/2014 0852   NITRITE NEGATIVE 08/07/2018 0944   LEUKOCYTESUR MODERATE (A) 08/07/2018 0944     STUDIES:  No results found.   ELIGIBLE FOR AVAILABLE RESEARCH PROTOCOL: no   ASSESSMENT: 68 y.o. [place], [place] man with a diagnosis of M-GUS confirmed March 2020 with the following parameters:  2.1 g M spike on SPEP obtained 03/05/2018, IgG kappa by IFE  Kappa/lambda ratio 3.35, kappa at 32.8 mg/L  Beta-2-microglobulin 2.0  IgG/A/M 2606/21/38  WBC 3.2, ANC 2.4, Hb 11.6, MCV 92.0, platelets 155  Creat 1.01, Ca++ 9.7, alb 4.0  Bone studies: no lytic lesions  Bone marrow biopsy 04/02/2018: 9% plasma cells, kappa restricted  (1) anemia: Work-up pending    PLAN: Adonai did well with this recent surgery which is not related to his M-Gus.  I suggest that he consider taking Aleve and Tylenol together with food and perhaps that will give him a little bit more pain relief.  He does have a stable mild anemia and we will add a basic anemia work-up to his next set of labs.  I will send him the results of today's MGUS labs as soon as they become available.  I do not expect any significant change.  He will have repeat lab work in 6 and 12 months and return to see me a week after the 42-monthlabs  He knows to call for  any other issue that may develop before the next visit.     Magrinat, GVirgie Dad MD  09/30/18 3:26 PM Medical Oncology and H[facility] [place] [id number]. [phone]   Fax. [phone] I, AJacqualyn Poseyam acting as a sEducation administratorfor GChauncey Cruel MD.   I, GLurline DelMD, have reviewed the above documentation for accuracy and completeness,  and I agree with the above.

## 2018-09-30 ENCOUNTER — Other Ambulatory Visit: Payer: Self-pay

## 2018-09-30 ENCOUNTER — Inpatient Hospital Stay: Payer: Medicare Other | Attending: Oncology | Admitting: Oncology

## 2018-09-30 ENCOUNTER — Inpatient Hospital Stay: Payer: Medicare Other

## 2018-09-30 VITALS — BP 146/92 | HR 66 | Temp 98.2°F | Resp 18 | Ht 67.0 in | Wt 239.1 lb

## 2018-09-30 DIAGNOSIS — D472 Monoclonal gammopathy: Secondary | ICD-10-CM

## 2018-09-30 DIAGNOSIS — E8809 Other disorders of plasma-protein metabolism, not elsewhere classified: Secondary | ICD-10-CM | POA: Insufficient documentation

## 2018-09-30 DIAGNOSIS — D72819 Decreased white blood cell count, unspecified: Secondary | ICD-10-CM | POA: Diagnosis not present

## 2018-09-30 DIAGNOSIS — D649 Anemia, unspecified: Secondary | ICD-10-CM | POA: Diagnosis not present

## 2018-09-30 DIAGNOSIS — Z79899 Other long term (current) drug therapy: Secondary | ICD-10-CM | POA: Insufficient documentation

## 2018-09-30 DIAGNOSIS — Z791 Long term (current) use of non-steroidal anti-inflammatories (NSAID): Secondary | ICD-10-CM | POA: Insufficient documentation

## 2018-09-30 DIAGNOSIS — E785 Hyperlipidemia, unspecified: Secondary | ICD-10-CM | POA: Insufficient documentation

## 2018-09-30 DIAGNOSIS — I10 Essential (primary) hypertension: Secondary | ICD-10-CM | POA: Insufficient documentation

## 2018-09-30 DIAGNOSIS — Z87891 Personal history of nicotine dependence: Secondary | ICD-10-CM | POA: Insufficient documentation

## 2018-09-30 LAB — CBC WITH DIFFERENTIAL/PLATELET
Abs Immature Granulocytes: 0.01 10*3/uL (ref 0.00–0.07)
Basophils Absolute: 0 10*3/uL (ref 0.0–0.1)
Basophils Relative: 1 %
Eosinophils Absolute: 0.1 10*3/uL (ref 0.0–0.5)
Eosinophils Relative: 1 %
HCT: 33.5 % — ABNORMAL LOW (ref 39.0–52.0)
Hemoglobin: 11.4 g/dL — ABNORMAL LOW (ref 13.0–17.0)
Immature Granulocytes: 0 %
Lymphocytes Relative: 29 %
Lymphs Abs: 1.2 10*3/uL (ref 0.7–4.0)
MCH: 31.4 pg (ref 26.0–34.0)
MCHC: 34 g/dL (ref 30.0–36.0)
MCV: 92.3 fL (ref 80.0–100.0)
Monocytes Absolute: 0.4 10*3/uL (ref 0.1–1.0)
Monocytes Relative: 10 %
Neutro Abs: 2.4 10*3/uL (ref 1.7–7.7)
Neutrophils Relative %: 59 %
Platelets: 179 10*3/uL (ref 150–400)
RBC: 3.63 MIL/uL — ABNORMAL LOW (ref 4.22–5.81)
RDW: 13.2 % (ref 11.5–15.5)
WBC: 4.2 10*3/uL (ref 4.0–10.5)
nRBC: 0 % (ref 0.0–0.2)

## 2018-09-30 LAB — COMPREHENSIVE METABOLIC PANEL
ALT: 26 U/L (ref 0–44)
AST: 41 U/L (ref 15–41)
Albumin: 4 g/dL (ref 3.5–5.0)
Alkaline Phosphatase: 94 U/L (ref 38–126)
Anion gap: 8 (ref 5–15)
BUN: 17 mg/dL (ref 8–23)
CO2: 27 mmol/L (ref 22–32)
Calcium: 9.7 mg/dL (ref 8.9–10.3)
Chloride: 103 mmol/L (ref 98–111)
Creatinine, Ser: 0.94 mg/dL (ref 0.61–1.24)
GFR calc Af Amer: >60 mL/min (ref >60)
GFR calc non Af Amer: >60 mL/min (ref >60)
Glucose, Bld: 114 mg/dL — ABNORMAL HIGH (ref 70–99)
Potassium: 3.9 mmol/L (ref 3.5–5.1)
Sodium: 138 mmol/L (ref 135–145)
Total Bilirubin: 0.5 mg/dL (ref 0.3–1.2)
Total Protein: 8.7 g/dL — ABNORMAL HIGH (ref 6.5–8.1)

## 2018-10-01 ENCOUNTER — Telehealth: Payer: Self-pay | Admitting: Oncology

## 2018-10-01 LAB — KAPPA/LAMBDA LIGHT CHAINS
Kappa free light chain: 49.2 mg/L — ABNORMAL HIGH (ref 3.3–19.4)
Kappa, lambda light chain ratio: 4.78 — ABNORMAL HIGH (ref 0.26–1.65)
Lambda free light chains: 10.3 mg/L (ref 5.7–26.3)

## 2018-10-01 NOTE — Telephone Encounter (Signed)
I left a message regarding schedule  

## 2018-10-02 LAB — MULTIPLE MYELOMA PANEL, SERUM
Albumin SerPl Elph-Mcnc: 3.9 g/dL (ref 2.9–4.4)
Albumin/Glob SerPl: 0.9 (ref 0.7–1.7)
Alpha 1: 0.3 g/dL (ref 0.0–0.4)
Alpha2 Glob SerPl Elph-Mcnc: 0.9 g/dL (ref 0.4–1.0)
B-Globulin SerPl Elph-Mcnc: 0.9 g/dL (ref 0.7–1.3)
Gamma Glob SerPl Elph-Mcnc: 2.4 g/dL — ABNORMAL HIGH (ref 0.4–1.8)
Globulin, Total: 4.5 g/dL — ABNORMAL HIGH (ref 2.2–3.9)
IgA: 20 mg/dL — ABNORMAL LOW (ref 61–437)
IgG (Immunoglobin G), Serum: 2900 mg/dL — ABNORMAL HIGH (ref 603–1613)
IgM (Immunoglobulin M), Srm: 40 mg/dL (ref 20–172)
M Protein SerPl Elph-Mcnc: 2.1 g/dL — ABNORMAL HIGH
Total Protein ELP: 8.4 g/dL (ref 6.0–8.5)

## 2018-10-03 DIAGNOSIS — H6122 Impacted cerumen, left ear: Secondary | ICD-10-CM | POA: Diagnosis not present

## 2018-10-04 ENCOUNTER — Encounter: Payer: Self-pay | Admitting: Oncology

## 2018-10-04 ENCOUNTER — Other Ambulatory Visit: Payer: Self-pay | Admitting: Oncology

## 2018-10-08 DIAGNOSIS — M1711 Unilateral primary osteoarthritis, right knee: Secondary | ICD-10-CM | POA: Diagnosis not present

## 2018-10-15 DIAGNOSIS — H0102B Squamous blepharitis left eye, upper and lower eyelids: Secondary | ICD-10-CM | POA: Diagnosis not present

## 2018-10-15 DIAGNOSIS — H04123 Dry eye syndrome of bilateral lacrimal glands: Secondary | ICD-10-CM | POA: Diagnosis not present

## 2018-10-15 DIAGNOSIS — H0102A Squamous blepharitis right eye, upper and lower eyelids: Secondary | ICD-10-CM | POA: Diagnosis not present

## 2018-10-15 DIAGNOSIS — Z961 Presence of intraocular lens: Secondary | ICD-10-CM | POA: Diagnosis not present

## 2018-10-24 DIAGNOSIS — M1711 Unilateral primary osteoarthritis, right knee: Secondary | ICD-10-CM | POA: Diagnosis not present

## 2018-10-24 DIAGNOSIS — Z6835 Body mass index (BMI) 35.0-35.9, adult: Secondary | ICD-10-CM | POA: Diagnosis not present

## 2018-10-29 DIAGNOSIS — Z23 Encounter for immunization: Secondary | ICD-10-CM | POA: Diagnosis not present

## 2018-10-31 DIAGNOSIS — M1711 Unilateral primary osteoarthritis, right knee: Secondary | ICD-10-CM | POA: Diagnosis not present

## 2018-11-11 DIAGNOSIS — M5416 Radiculopathy, lumbar region: Secondary | ICD-10-CM | POA: Diagnosis not present

## 2018-11-11 DIAGNOSIS — Z9889 Other specified postprocedural states: Secondary | ICD-10-CM | POA: Diagnosis not present

## 2018-11-13 DIAGNOSIS — M6281 Muscle weakness (generalized): Secondary | ICD-10-CM | POA: Diagnosis not present

## 2018-11-13 DIAGNOSIS — M4326 Fusion of spine, lumbar region: Secondary | ICD-10-CM | POA: Diagnosis not present

## 2018-11-14 DIAGNOSIS — M1711 Unilateral primary osteoarthritis, right knee: Secondary | ICD-10-CM | POA: Diagnosis not present

## 2018-11-20 DIAGNOSIS — M4326 Fusion of spine, lumbar region: Secondary | ICD-10-CM | POA: Diagnosis not present

## 2018-11-20 DIAGNOSIS — M6281 Muscle weakness (generalized): Secondary | ICD-10-CM | POA: Diagnosis not present

## 2018-11-25 DIAGNOSIS — M4326 Fusion of spine, lumbar region: Secondary | ICD-10-CM | POA: Diagnosis not present

## 2018-11-25 DIAGNOSIS — M6281 Muscle weakness (generalized): Secondary | ICD-10-CM | POA: Diagnosis not present

## 2018-11-27 DIAGNOSIS — M6281 Muscle weakness (generalized): Secondary | ICD-10-CM | POA: Diagnosis not present

## 2018-11-27 DIAGNOSIS — M4326 Fusion of spine, lumbar region: Secondary | ICD-10-CM | POA: Diagnosis not present

## 2018-12-02 DIAGNOSIS — M4326 Fusion of spine, lumbar region: Secondary | ICD-10-CM | POA: Diagnosis not present

## 2018-12-02 DIAGNOSIS — M6281 Muscle weakness (generalized): Secondary | ICD-10-CM | POA: Diagnosis not present

## 2018-12-03 DIAGNOSIS — M6281 Muscle weakness (generalized): Secondary | ICD-10-CM | POA: Diagnosis not present

## 2018-12-03 DIAGNOSIS — M4326 Fusion of spine, lumbar region: Secondary | ICD-10-CM | POA: Diagnosis not present

## 2018-12-09 DIAGNOSIS — M4326 Fusion of spine, lumbar region: Secondary | ICD-10-CM | POA: Diagnosis not present

## 2018-12-09 DIAGNOSIS — M6281 Muscle weakness (generalized): Secondary | ICD-10-CM | POA: Diagnosis not present

## 2018-12-11 DIAGNOSIS — M6281 Muscle weakness (generalized): Secondary | ICD-10-CM | POA: Diagnosis not present

## 2018-12-11 DIAGNOSIS — M4326 Fusion of spine, lumbar region: Secondary | ICD-10-CM | POA: Diagnosis not present

## 2018-12-16 DIAGNOSIS — M4326 Fusion of spine, lumbar region: Secondary | ICD-10-CM | POA: Diagnosis not present

## 2018-12-16 DIAGNOSIS — M6281 Muscle weakness (generalized): Secondary | ICD-10-CM | POA: Diagnosis not present

## 2018-12-23 DIAGNOSIS — M545 Low back pain: Secondary | ICD-10-CM | POA: Diagnosis not present

## 2019-01-13 DIAGNOSIS — M4326 Fusion of spine, lumbar region: Secondary | ICD-10-CM | POA: Diagnosis not present

## 2019-01-13 DIAGNOSIS — M6281 Muscle weakness (generalized): Secondary | ICD-10-CM | POA: Diagnosis not present

## 2019-01-15 DIAGNOSIS — M4326 Fusion of spine, lumbar region: Secondary | ICD-10-CM | POA: Diagnosis not present

## 2019-01-15 DIAGNOSIS — M6281 Muscle weakness (generalized): Secondary | ICD-10-CM | POA: Diagnosis not present

## 2019-01-22 DIAGNOSIS — M6281 Muscle weakness (generalized): Secondary | ICD-10-CM | POA: Diagnosis not present

## 2019-01-22 DIAGNOSIS — M4326 Fusion of spine, lumbar region: Secondary | ICD-10-CM | POA: Diagnosis not present

## 2019-01-23 DIAGNOSIS — M4326 Fusion of spine, lumbar region: Secondary | ICD-10-CM | POA: Diagnosis not present

## 2019-01-23 DIAGNOSIS — M6281 Muscle weakness (generalized): Secondary | ICD-10-CM | POA: Diagnosis not present

## 2019-01-27 DIAGNOSIS — M6281 Muscle weakness (generalized): Secondary | ICD-10-CM | POA: Diagnosis not present

## 2019-01-27 DIAGNOSIS — M4326 Fusion of spine, lumbar region: Secondary | ICD-10-CM | POA: Diagnosis not present

## 2019-01-29 DIAGNOSIS — M4326 Fusion of spine, lumbar region: Secondary | ICD-10-CM | POA: Diagnosis not present

## 2019-01-29 DIAGNOSIS — M6281 Muscle weakness (generalized): Secondary | ICD-10-CM | POA: Diagnosis not present

## 2019-01-30 ENCOUNTER — Ambulatory Visit: Payer: Medicare Other | Attending: Internal Medicine

## 2019-01-30 DIAGNOSIS — M6281 Muscle weakness (generalized): Secondary | ICD-10-CM | POA: Diagnosis not present

## 2019-01-30 DIAGNOSIS — M4326 Fusion of spine, lumbar region: Secondary | ICD-10-CM | POA: Diagnosis not present

## 2019-01-30 DIAGNOSIS — Z23 Encounter for immunization: Secondary | ICD-10-CM | POA: Insufficient documentation

## 2019-01-30 NOTE — Progress Notes (Signed)
   [id number] [facility]  Name:  Walter White.    MRN: [id number] DOB: 13-Oct-1950  01/30/2019  Walter White was observed post Covid-19 immunization for 15 minutes without incidence. He was provided with Vaccine Information Sheet and instruction to access the V-Safe system.   Walter White was instructed to call 911 with any severe reactions post vaccine: [name] Difficulty breathing  . Swelling of your face and throat  . A fast heartbeat  . A bad rash all over your body  . Dizziness and weakness    Immunizations Administered    Name Date Dose VIS Date Route   Pfizer COVID-19 Vaccine 01/30/2019  1:24 PM 0.3 mL 12/20/2018 Intramuscular   Manufacturer: [place]   Lot: [id number]   [place]: [id number]

## 2019-02-04 DIAGNOSIS — M545 Low back pain: Secondary | ICD-10-CM | POA: Diagnosis not present

## 2019-02-04 DIAGNOSIS — M7061 Trochanteric bursitis, right hip: Secondary | ICD-10-CM | POA: Diagnosis not present

## 2019-02-20 ENCOUNTER — Ambulatory Visit: Payer: Medicare Other | Attending: Internal Medicine

## 2019-02-20 DIAGNOSIS — Z23 Encounter for immunization: Secondary | ICD-10-CM | POA: Insufficient documentation

## 2019-02-20 NOTE — Progress Notes (Signed)
   [id number] Vaccination [facility]  Name:  Walter White.    MRN: [id number] DOB: 15-Sep-1950  02/20/2019  Mr. Walter White was observed post Covid-19 immunization for 15 minutes without incidence. He was provided with Vaccine Information Sheet and instruction to access the V-Safe system.   Mr. Walter White was instructed to call 911 with any severe reactions post vaccine: [name] Difficulty breathing  . Swelling of your face and throat  . A fast heartbeat  . A bad rash all over your body  . Dizziness and weakness    Immunizations Administered    Name Date Dose VIS Date Route   Pfizer COVID-19 Vaccine 02/20/2019  1:23 PM 0.3 mL 12/20/2018 Intramuscular   Manufacturer: [place]   Lot: [id number]   [place]: [id number]

## 2019-03-03 ENCOUNTER — Telehealth: Payer: Self-pay | Admitting: Internal Medicine

## 2019-03-03 NOTE — Telephone Encounter (Signed)
Pt was scheduled for colon at the hospital last year at the hospital due to flag of difficult airway. He did not have procedure as he did not want to due to covid and did not want it at the hospital. Pt is wanting to reschedule but states he has had surgery since then and did not have any issues with anesthesia. He wants to know if he still need to have procedure at hospital. Please advise.

## 2019-03-03 NOTE — Telephone Encounter (Signed)
Linda,  Yes, he is not a difficult intubation. He is cleared for anesthetic care at [facility].  Thanks,  Osvaldo Angst

## 2019-03-03 NOTE — Telephone Encounter (Signed)
Patient calling- stating that he had a colonoscopy scheduled for last year. He ended up cancelling because of covid and he did not want to have procedure in the hospital. He states that the told him he was a difficult intubation and that's why he needed to have the procedure at the hospital. He states that he talked to someone last year that cleared that up- but he would like to reschedule colonoscopy. He is asking if needs to have it at the hospital or if he is able to have it in the [facility]. He says if he absolutely has to have it at the hospital that it is fine.

## 2019-03-12 ENCOUNTER — Encounter: Payer: Self-pay | Admitting: Internal Medicine

## 2019-03-12 NOTE — Telephone Encounter (Signed)
Pt was scheduled for colon in July but it was cancelled due to Covid. Walter White reviewed pts chart and he is not a diff intubation and can be done in the [place]. Does pt need and OV or can the colon just be rescheduled. Please advise.

## 2019-03-12 NOTE — Telephone Encounter (Signed)
See note below from Dr. Henrene Pastor, pt may have colon scheduled in the [place].

## 2019-03-12 NOTE — Telephone Encounter (Signed)
Colon scheduled on 4/13 at [facility].

## 2019-03-12 NOTE — Telephone Encounter (Signed)
Pt calling about the messages below. He wants to know if he can r/s his procedure. Pls call him at his cell # [phone].

## 2019-03-12 NOTE — Telephone Encounter (Signed)
No office visit.  Schedule colonoscopy in the [place].  Thanks

## 2019-03-28 ENCOUNTER — Telehealth (HOSPITAL_COMMUNITY): Payer: Self-pay

## 2019-03-28 NOTE — Telephone Encounter (Signed)

## 2019-03-31 ENCOUNTER — Other Ambulatory Visit: Payer: Self-pay

## 2019-03-31 ENCOUNTER — Ambulatory Visit (INDEPENDENT_AMBULATORY_CARE_PROVIDER_SITE_OTHER): Payer: Medicare Other | Admitting: Physician Assistant

## 2019-03-31 ENCOUNTER — Ambulatory Visit (HOSPITAL_COMMUNITY)
Admission: RE | Admit: 2019-03-31 | Discharge: 2019-03-31 | Disposition: A | Discharge status: Home / Self Care | Payer: Medicare Other | Source: Ambulatory Visit | Attending: Surgery | Admitting: Surgery

## 2019-03-31 ENCOUNTER — Other Ambulatory Visit: Payer: Self-pay | Admitting: Oncology

## 2019-03-31 ENCOUNTER — Inpatient Hospital Stay: Payer: Medicare Other | Attending: Oncology

## 2019-03-31 ENCOUNTER — Other Ambulatory Visit: Payer: Self-pay | Admitting: *Deleted

## 2019-03-31 VITALS — BP 144/81 | HR 78 | Temp 98.2°F | Resp 18 | Ht 67.5 in | Wt 234.0 lb

## 2019-03-31 DIAGNOSIS — I714 Abdominal aortic aneurysm, without rupture, unspecified: Secondary | ICD-10-CM

## 2019-03-31 DIAGNOSIS — Z87891 Personal history of nicotine dependence: Secondary | ICD-10-CM | POA: Diagnosis not present

## 2019-03-31 DIAGNOSIS — Z791 Long term (current) use of non-steroidal anti-inflammatories (NSAID): Secondary | ICD-10-CM | POA: Diagnosis not present

## 2019-03-31 DIAGNOSIS — Z79899 Other long term (current) drug therapy: Secondary | ICD-10-CM | POA: Diagnosis not present

## 2019-03-31 DIAGNOSIS — D72819 Decreased white blood cell count, unspecified: Secondary | ICD-10-CM | POA: Diagnosis not present

## 2019-03-31 DIAGNOSIS — E785 Hyperlipidemia, unspecified: Secondary | ICD-10-CM | POA: Diagnosis not present

## 2019-03-31 DIAGNOSIS — E8809 Other disorders of plasma-protein metabolism, not elsewhere classified: Secondary | ICD-10-CM | POA: Diagnosis not present

## 2019-03-31 DIAGNOSIS — D649 Anemia, unspecified: Secondary | ICD-10-CM | POA: Insufficient documentation

## 2019-03-31 DIAGNOSIS — I10 Essential (primary) hypertension: Secondary | ICD-10-CM | POA: Diagnosis not present

## 2019-03-31 DIAGNOSIS — D472 Monoclonal gammopathy: Secondary | ICD-10-CM

## 2019-03-31 LAB — IRON AND TIBC
Iron: 82 ug/dL (ref 42–163)
Saturation Ratios: 23 % (ref 20–55)
TIBC: 359 ug/dL (ref 202–409)
UIBC: 276 ug/dL (ref 117–376)

## 2019-03-31 LAB — COMPREHENSIVE METABOLIC PANEL
ALT: 37 U/L (ref 0–44)
AST: 39 U/L (ref 15–41)
Albumin: 4 g/dL (ref 3.5–5.0)
Alkaline Phosphatase: 87 U/L (ref 38–126)
Anion gap: 9 (ref 5–15)
BUN: 25 mg/dL — ABNORMAL HIGH (ref 8–23)
CO2: 25 mmol/L (ref 22–32)
Calcium: 9.6 mg/dL (ref 8.9–10.3)
Chloride: 104 mmol/L (ref 98–111)
Creatinine, Ser: 1.05 mg/dL (ref 0.61–1.24)
GFR calc Af Amer: >60 mL/min (ref >60)
GFR calc non Af Amer: >60 mL/min (ref >60)
Glucose, Bld: 178 mg/dL — ABNORMAL HIGH (ref 70–99)
Potassium: 4 mmol/L (ref 3.5–5.1)
Sodium: 138 mmol/L (ref 135–145)
Total Bilirubin: 0.7 mg/dL (ref 0.3–1.2)
Total Protein: 8.8 g/dL — ABNORMAL HIGH (ref 6.5–8.1)

## 2019-03-31 LAB — RETICULOCYTES
Immature Retic Fract: 16.9 % — ABNORMAL HIGH (ref 2.3–15.9)
RBC.: 3.87 MIL/uL — ABNORMAL LOW (ref 4.22–5.81)
Retic Count, Absolute: 78.2 10*3/uL (ref 19.0–186.0)
Retic Ct Pct: 2 % (ref 0.4–3.1)

## 2019-03-31 LAB — PROTEIN / CREATININE RATIO, URINE
Creatinine, Urine: 102.92 mg/dL
Protein Creatinine Ratio: 0.07 mg/mg{Cre} (ref 0.00–0.15)
Total Protein, Urine: 7 mg/dL

## 2019-03-31 LAB — CBC WITH DIFFERENTIAL/PLATELET
Abs Immature Granulocytes: 0 10*3/uL (ref 0.00–0.07)
Basophils Absolute: 0 10*3/uL (ref 0.0–0.1)
Basophils Relative: 0 %
Eosinophils Absolute: 0 10*3/uL (ref 0.0–0.5)
Eosinophils Relative: 1 %
HCT: 35.7 % — ABNORMAL LOW (ref 39.0–52.0)
Hemoglobin: 12 g/dL — ABNORMAL LOW (ref 13.0–17.0)
Immature Granulocytes: 0 %
Lymphocytes Relative: 14 %
Lymphs Abs: 0.7 10*3/uL (ref 0.7–4.0)
MCH: 31.3 pg (ref 26.0–34.0)
MCHC: 33.6 g/dL (ref 30.0–36.0)
MCV: 93 fL (ref 80.0–100.0)
Monocytes Absolute: 0.3 10*3/uL (ref 0.1–1.0)
Monocytes Relative: 6 %
Neutro Abs: 3.6 10*3/uL (ref 1.7–7.7)
Neutrophils Relative %: 79 %
Platelets: 169 10*3/uL (ref 150–400)
RBC: 3.84 MIL/uL — ABNORMAL LOW (ref 4.22–5.81)
RDW: 13.4 % (ref 11.5–15.5)
WBC: 4.6 10*3/uL (ref 4.0–10.5)
nRBC: 0 % (ref 0.0–0.2)

## 2019-03-31 LAB — FOLATE: Folate: 17.4 ng/mL (ref >5.9)

## 2019-03-31 LAB — VITAMIN B12: Vitamin B-12: 299 pg/mL (ref 180–914)

## 2019-03-31 LAB — FERRITIN: Ferritin: 588 ng/mL — ABNORMAL HIGH (ref 24–336)

## 2019-03-31 LAB — LACTATE DEHYDROGENASE: LDH: 220 U/L — ABNORMAL HIGH (ref 98–192)

## 2019-03-31 NOTE — Progress Notes (Signed)
Patient ID: Walter White., male   DOB: 27-Apr-1950, 69 y.o.   MRN: [id number]  Reason for Consult: AAA (follow-up)   Referred by Deland Pretty, MD  Subjective:     HPI:  Walter White. is a 69 y.o. male with a history of abdominal aortic aneurysm.  He has a history of lumbar disc degeneration, stenosis and radiculopathy.  During his initial work-up an aortic aneurysm was noted.  He underwent anterior approach by Dr. Donnetta Hutching.  He subsequently has undergone L3-S1 fusion and underwent a second posterior spinal fusion (L2-3) on 08/15/2018 by Dr. Lynann Bologna.  He has undergone a total of 4 back surgeries.   He returns today for routine surveillance.   He has chronic back pain that is well managed. He has had intermittent stomach upset and diarrhea his primary care physician feels this is most likely due to medication.  No sudden abdominal pain.  He denies leg pain, tingling or numbness.  He states in the past he developed burning of the right heel that he attributed to sleeping in his recliner which he has done for years.  He was prescribed gabapentin with good results.  He is also been told recently his hemoglobin is low and he had a bone marrow biopsy.  He is to undergo colonoscopy nest month.   He has been vaccinated for Covid-19. Past Medical History:  Diagnosis Date  . AAA (abdominal aortic aneurysm) (HCC)    4.0 cm AAA 08/22/17 U/S  . Allergy    Allergic Rhinitis  . Arthritis    spinal steniosis  . Benign prostate hyperplasia    with lower urinary tract symptoms  . Carpal tunnel syndrome on right   . Cataracts, both eyes   . Chronic back pain    caused by spinal stenosis  . Elevated PSA   . GERD (gastroesophageal reflux disease)   . Hyperlipidemia   . Hypertension    Essential.    . MGUS (monoclonal gammopathy of unknown significance)   . Microhematuria   . OA (osteoarthritis) of knee   . Pneumonia    Hx -when very young  . Pre-diabetes   . Seasonal allergies   .  Spinal stenosis    of lumbosacral region.  Causes chronic back pain.  [name] Upper abdominal pain    Family History  Problem Relation Age of Onset  . Cancer Mother   . Liver disease Father   . Colon cancer Neg Hx   . Colon polyps Neg Hx   . Esophageal cancer Neg Hx   . Rectal cancer Neg Hx   . Stomach cancer Neg Hx    Past Surgical History:  Procedure Laterality Date  . ABDOMINAL EXPOSURE N/A 09/05/2017   Procedure: ABDOMINAL EXPOSURE;  Surgeon: Rosetta Posner, MD;  Location: [facility];  Service: Vascular;  Laterality: N/A;  . ANTERIOR LAT LUMBAR FUSION Left 01/26/2016   Procedure: LEFT SIDED LATERAL INTERBODY FUSION, LUMBAR 3-4 WITH INSTRUMENTATION AND ALLOGRAFT;  Surgeon: Phylliss Bob, MD;  Location: [facility];  Service: Orthopedics;  Laterality: Left;  LEFT SIDED LATERAL INTERBODY FUSION, LUMBAR 3-4 WITH INSTRUMENTATION AND ALLOGRAFT  . ANTERIOR LAT LUMBAR FUSION Right 08/14/2018   Procedure: RIGHT LUMBAR two to LUMBAR three LATERAL INTERBODY FUSION WITH INSTRUMENTATION AND ALLOGRAFT;  Surgeon: Phylliss Bob, MD;  Location: [place];  Service: Orthopedics;  Laterality: Right;  . ANTERIOR LATERAL LUMBAR FUSION WITH PERCUTANEOUS SCREW 1 LEVEL N/A 09/05/2017   Procedure: LUMBAR 5 - SACRUM 1 ANTERIOR  LUMBAR INTERBODY FUSION WITH INSTRUMENTATION AND ALLOGRAFT;  Surgeon: Phylliss Bob, MD;  Location: [facility];  Service: Orthopedics;  Laterality: N/A;  . BACK SURGERY  01/2015   dr. Lynann Bologna. [id number].  [name] CARPAL TUNNEL RELEASE     right   . CATARACT EXTRACTION, BILATERAL    . COLONOSCOPY    . COLONOSCOPY  2010   polyp  . ELBOW SURGERY     right ulnar release  . ELBOW SURGERY     LEFT CTR  . JOINT REPLACEMENT     left- Sept 2016  . KNEE ARTHROSCOPY  11/23/2010   Procedure: ARTHROSCOPY KNEE;  Surgeon: Kerin Salen;  Location: [place];  Service: Orthopedics;  Laterality: Left;  arthroscopy left knee  . KNEE ARTHROSCOPY     left x 2  . Knee scar tissue removal Left 2018  . LATERAL  FUSION LUMBAR SPINE  01./17/2018  . polypectomy  2010  . SHOULDER ARTHROSCOPY Left    x2  . TONSILLECTOMY    . TOTAL KNEE ARTHROPLASTY Left 09/21/2014   Procedure: TOTAL KNEE ARTHROPLASTY;  Surgeon: Frederik Pear, MD;  Location: [place];  Service: Orthopedics;  Laterality: Left;  . ULNAR NERVE TRANSPOSITION Left 04/16/2017   Procedure: LEFT ULNAR NEUROPLASTY AT THE WRIST AND TRANSPOSITION AT THE ELBOW;  Surgeon: Milly Jakob, MD;  Location: [place];  Service: Orthopedics;  Laterality: Left;  . WISDOM TOOTH EXTRACTION      Short Social History:  Social History   Tobacco Use  . Smoking status: Former Smoker    Quit date: 11/21/1980    Years since quitting: 38.3  . Smokeless tobacco: Never Used  Substance Use Topics  . Alcohol use: Yes    Alcohol/week: 0.0 standard drinks    Comment: Very rare    Allergies  Allergen Reactions  . Pork-Derived Products Shortness Of Breath  . Shellfish Allergy Shortness Of Breath  . Lovastatin Other (See Comments)    Bone pain   . Penicillins     UNSPECIFIED CHILDHOOD REACTION  Has patient had a PCN reaction causing immediate rash, facial/tongue/throat swelling, SOB or lightheadedness with hypotension: Unknown Has patient had a PCN reaction causing severe rash involving mucus membranes or skin necrosis: Unknown Has patient had a PCN reaction that required hospitalization: Unknown Has patient had a PCN reaction occurring within the last 10 years: No If all of the above answers are "NO", then may proceed with Cephalosporin use.     Current Outpatient Medications  Medication Sig Dispense Refill  . diazepam (VALIUM) 5 MG tablet Take 1 tablet (5 mg total) by mouth every 6 (six) hours as needed for muscle spasms. 30 tablet 0  . docusate sodium (COLACE) 100 MG capsule Take 100 mg by mouth daily.     [name] lisinopril-hydrochlorothiazide (PRINZIDE,ZESTORETIC) 20-25 MG tablet Take 1 tablet by mouth daily.    [name] MAGNESIUM CITRATE PO Take 750 mg  by mouth daily.    . methocarbamol (ROBAXIN) 750 MG tablet Take 750 mg by mouth 2 (two) times a day.    . Nutritional Supplements (JUICE PLUS FIBRE PO) Take 6 capsules by mouth daily. Juice Plus+ Orchard Blend 2 capsules in the morning Juice Plus+ Garden Blend 2 capsules in the morning Juice Plus+ Vineyard Blend 2 capsule in the morning    . oxyCODONE-acetaminophen (PERCOCET/ROXICET) 5-325 MG tablet Take 1-2 tablets by mouth every 4 (four) hours as needed for moderate pain or severe pain. 30 tablet 0  . pantoprazole (PROTONIX)  40 MG tablet Take 40 mg by mouth daily.    . rosuvastatin (CRESTOR) 5 MG tablet Take 5 mg by mouth daily.    . Tamsulosin HCl (FLOMAX) 0.4 MG CAPS Take 0.4 mg by mouth at bedtime.     . Tetrahydrozoline HCl (VISINE OP) Place 1 drop into both eyes daily as needed (irritation).     No current facility-administered medications for this visit.    REVIEW OF SYSTEMS  Review of Systems  Constitutional:  Constitutional negative. HENT: HENT negative.  Eyes: Eyes negative.  Respiratory: Respiratory negative.  Cardiovascular: Cardiovascular negative.  GI: Gastrointestinal negative.  Musculoskeletal: Positive for back pain.  Neurological: Positive for left heel burning controlled with gabapentin.  Hematologic: Hematologic/lymphatic negative.  Psychiatric: Psychiatric negative.     Objective:  Objective   Vitals:   03/31/19 0910  Weight: 234 lb (106.1 kg)  Height: 5' 7.5" (1.715 m)   Physical Exam  General appearance: The patient is a well-developed well-nourished male in no apparent distress CV: Heart rate and rhythm are regular.  No carotid bruits Lungs: Clear to auscultation bilaterally Abdomen: Soft without pulsatile mass Extremities: Normal active range of motion.  Normal strength. Pulse exam: The patient has bilateral 2+ brachial, radial, femoral, popliteal and posterior tibial artery pulses.  Data: aortic ultrasound  August 22, 2017 largest aortic  diameter = 4.0 cm  Today: (March 22,2021) Largest diameter = 4.0 cm (AP)      Assessment/Plan:    69 year old male with history of AAA. There is been no change in size of the abdominal aortic aneurysm as compared to August 2019.    Barbie Banner, PA-C [facility]  [facility] MD: Trula Slade

## 2019-04-01 ENCOUNTER — Other Ambulatory Visit: Payer: Self-pay | Admitting: *Deleted

## 2019-04-01 DIAGNOSIS — I714 Abdominal aortic aneurysm, without rupture, unspecified: Secondary | ICD-10-CM

## 2019-04-01 LAB — KAPPA/LAMBDA LIGHT CHAINS
Kappa free light chain: 44.9 mg/L — ABNORMAL HIGH (ref 3.3–19.4)
Kappa, lambda light chain ratio: 4.08 — ABNORMAL HIGH (ref 0.26–1.65)
Lambda free light chains: 11 mg/L (ref 5.7–26.3)

## 2019-04-02 LAB — MULTIPLE MYELOMA PANEL, SERUM
Albumin SerPl Elph-Mcnc: 4 g/dL (ref 2.9–4.4)
Albumin/Glob SerPl: 1 (ref 0.7–1.7)
Alpha 1: 0.2 g/dL (ref 0.0–0.4)
Alpha2 Glob SerPl Elph-Mcnc: 0.7 g/dL (ref 0.4–1.0)
B-Globulin SerPl Elph-Mcnc: 0.9 g/dL (ref 0.7–1.3)
Gamma Glob SerPl Elph-Mcnc: 2.5 g/dL — ABNORMAL HIGH (ref 0.4–1.8)
Globulin, Total: 4.4 g/dL — ABNORMAL HIGH (ref 2.2–3.9)
IgA: 18 mg/dL — ABNORMAL LOW (ref 61–437)
IgG (Immunoglobin G), Serum: 2890 mg/dL — ABNORMAL HIGH (ref 603–1613)
IgM (Immunoglobulin M), Srm: 36 mg/dL (ref 20–172)
M Protein SerPl Elph-Mcnc: 2.2 g/dL — ABNORMAL HIGH
Total Protein ELP: 8.4 g/dL (ref 6.0–8.5)

## 2019-04-03 ENCOUNTER — Other Ambulatory Visit: Payer: Self-pay | Admitting: Oncology

## 2019-04-03 ENCOUNTER — Encounter: Payer: Self-pay | Admitting: Oncology

## 2019-04-08 ENCOUNTER — Other Ambulatory Visit: Payer: Self-pay

## 2019-04-08 ENCOUNTER — Ambulatory Visit (AMBULATORY_SURGERY_CENTER): Payer: Self-pay

## 2019-04-08 VITALS — Temp 97.3°F | Ht 67.75 in | Wt 234.0 lb

## 2019-04-08 DIAGNOSIS — Z860100 Personal history of colon polyps, unspecified: Secondary | ICD-10-CM

## 2019-04-08 MED ORDER — NA SULFATE-K SULFATE-MG SULF 17.5-3.13-1.6 GM/177ML PO SOLN: 1.0000 | Freq: Once | ORAL | 0 refills | Status: AC

## 2019-04-08 NOTE — Progress Notes (Signed)
No allergies to soy or egg Pt is not on blood thinners or diet pills Denies issues with sedation/intubation Denies atrial flutter/fib Denies constipation   Emmi instructions given to pt  Pt is aware of Covid safety and care partner requirements.  

## 2019-04-14 ENCOUNTER — Telehealth: Payer: Self-pay | Admitting: Internal Medicine

## 2019-04-14 NOTE — Telephone Encounter (Signed)
Pt states with his medicare and his [place] [employer] Suprep is [id number]   His procedure is next week  Sample Suprep Lot [id number]  Exp 01/23  Pt to pick up sample 3rd Floor this week

## 2019-04-14 NOTE — Telephone Encounter (Signed)
Returned call- no answer-LMTRC  Lelan Pons PV

## 2019-04-22 ENCOUNTER — Other Ambulatory Visit: Payer: Self-pay

## 2019-04-22 ENCOUNTER — Ambulatory Visit (AMBULATORY_SURGERY_CENTER): Payer: Medicare Other | Admitting: Internal Medicine

## 2019-04-22 ENCOUNTER — Encounter: Payer: Self-pay | Admitting: Internal Medicine

## 2019-04-22 VITALS — BP 121/75 | HR 71 | Temp 96.9°F | Resp 15 | Ht 67.5 in | Wt 234.0 lb

## 2019-04-22 DIAGNOSIS — D122 Benign neoplasm of ascending colon: Secondary | ICD-10-CM | POA: Diagnosis not present

## 2019-04-22 DIAGNOSIS — Z86010 Personal history of colonic polyps: Secondary | ICD-10-CM | POA: Diagnosis not present

## 2019-04-22 MED ORDER — SODIUM CHLORIDE 0.9 % IV SOLN: 500.0000 mL | Freq: Once | INTRAVENOUS | Status: DC

## 2019-04-22 NOTE — Progress Notes (Signed)
Called to room to assist during endoscopic procedure.  Patient ID and intended procedure confirmed with present staff. Received instructions for my participation in the procedure from the performing physician.  

## 2019-04-22 NOTE — Patient Instructions (Signed)
Handout on polyps given. ° °YOU HAD AN ENDOSCOPIC PROCEDURE TODAY AT THE [facility]:   Refer to the procedure report that was given to you for any specific questions about what was found during the examination.  If the procedure report does not answer your questions, please call your gastroenterologist to clarify.  If you requested that your care partner not be given the details of your procedure findings, then the procedure report has been included in a sealed envelope for you to review at your convenience later. ° °YOU SHOULD EXPECT: Some feelings of bloating in the abdomen. Passage of more gas than usual.  Walking can help get rid of the air that was put into your GI tract during the procedure and reduce the bloating. If you had a lower endoscopy (such as a colonoscopy or flexible sigmoidoscopy) you may notice spotting of blood in your stool or on the toilet paper. If you underwent a bowel prep for your procedure, you may not have a normal bowel movement for a few days. ° °Please Note:  You might notice some irritation and congestion in your nose or some drainage.  This is from the oxygen used during your procedure.  There is no need for concern and it should clear up in a day or so. ° °SYMPTOMS TO REPORT IMMEDIATELY: ° °Following lower endoscopy (colonoscopy or flexible sigmoidoscopy): ° Excessive amounts of blood in the stool ° Significant tenderness or worsening of abdominal pains ° Swelling of the abdomen that is new, acute ° Fever of 100°F or higher ° °For urgent or emergent issues, a gastroenterologist can be reached at any hour by calling [phone]. °Do not use MyChart messaging for urgent concerns.  ° ° °DIET:  We do recommend a small meal at first, but then you may proceed to your regular diet.  Drink plenty of fluids but you should avoid alcoholic beverages for 24 hours. ° °ACTIVITY:  You should plan to take it easy for the rest of today and you should NOT DRIVE or use heavy machinery  until tomorrow (because of the sedation medicines used during the test).   ° °FOLLOW UP: °Our staff will call the number listed on your records 48-72 hours following your procedure to check on you and address any questions or concerns that you may have regarding the information given to you following your procedure. If we do not reach you, we will leave a message.  We will attempt to reach you two times.  During this call, we will ask if you have developed any symptoms of COVID 19. If you develop any symptoms (ie: fever, flu-like symptoms, shortness of breath, cough etc.) before then, please call [phone].  If you test positive for Covid 19 in the 2 weeks post procedure, please call and report this information to us.   ° °If any biopsies were taken you will be contacted by phone or by letter within the next 1-3 weeks.  Please call us at [phone] if you have not heard about the biopsies in 3 weeks.  ° ° °SIGNATURES/CONFIDENTIALITY: °You and/or your care partner have signed paperwork which will be entered into your electronic medical record.  These signatures attest to the fact that that the information above on your After Visit Summary has been reviewed and is understood.  Full responsibility of the confidentiality of this discharge information lies with you and/or your care-partner.  °

## 2019-04-22 NOTE — Op Note (Signed)
[facility] Patient Name: Walter White Procedure Date: 04/22/2019 10:36 AM MRN: [id number] Endoscopist: Docia Chuck. Henrene Pastor , MD Age: 69 Referring MD:  Date of Birth: 11-17-1950 Gender: Male Account #: [id number] Procedure:                Colonoscopy with cold snare polypectomy x 4 Indications:              High risk colon cancer surveillance: Personal                            history of multiple (3 or more) adenomas. Previous                            examinations 2010, 2013, 2016 Medicines:                Monitored Anesthesia Care Procedure:                Pre-Anesthesia Assessment:                           - Prior to the procedure, a History and Physical                            was performed, and patient medications and                            allergies were reviewed. The patient's tolerance of                            previous anesthesia was also reviewed. The risks                            and benefits of the procedure and the sedation                            options and risks were discussed with the patient.                            All questions were answered, and informed consent                            was obtained. Prior Anticoagulants: The patient has                            taken no previous anticoagulant or antiplatelet                            agents. ASA Grade Assessment: II - A patient with                            mild systemic disease. After reviewing the risks                            and benefits, the patient was deemed in  satisfactory condition to undergo the procedure.                           After obtaining informed consent, the colonoscope                            was passed under direct vision. Throughout the                            procedure, the patient's blood pressure, pulse, and                            oxygen saturations were monitored continuously. The   Colonoscope was introduced through the anus and                            advanced to the the cecum, identified by                            appendiceal orifice and ileocecal valve. The                            ileocecal valve, appendiceal orifice, and rectum                            were photographed. The quality of the bowel                            preparation was good. The colonoscopy was performed                            without difficulty. The patient tolerated the                            procedure well. The bowel preparation used was                            SUPREP via split dose instruction. Scope In: 10:51:48 AM Scope Out: 11:07:50 AM Scope Withdrawal Time: 0 hours 11 minutes 24 seconds  Total Procedure Duration: 0 hours 16 minutes 2 seconds  Findings:                 Four polyps were found in the ascending colon. The                            polyps were 2 to 3 mm in size. These polyps were                            removed with a cold snare. Resection and retrieval                            were complete.                           Internal hemorrhoids were  found during retroflexion.                           The exam was otherwise without abnormality on                            direct and retroflexion views. Complications:            No immediate complications. Estimated blood loss:                            None. Estimated Blood Loss:     Estimated blood loss: none. Impression:               - Four 2 to 3 mm polyps in the ascending colon,                            removed with a cold snare. Resected and retrieved.                           - Internal hemorrhoids.                           - The examination was otherwise normal on direct                            and retroflexion views. Recommendation:           - Repeat colonoscopy in 3 - 5 years for                            surveillance.                           - Patient has a contact number  available for                            emergencies. The signs and symptoms of potential                            delayed complications were discussed with the                            patient. Return to normal activities tomorrow.                            Written discharge instructions were provided to the                            patient.                           - Resume previous diet.                           - Continue present medications.                           -  Await pathology results. Docia Chuck. Henrene Pastor, MD 04/22/2019 11:15:39 AM This report has been signed electronically.

## 2019-04-22 NOTE — Progress Notes (Signed)
LC - temp  cw - vs  Pt's states no medical or surgical changes since previsit or office visit.

## 2019-04-22 NOTE — Progress Notes (Signed)
PT taken to PACU. Monitors in place. VSS. Report given to RN. 

## 2019-04-24 ENCOUNTER — Telehealth: Payer: Self-pay

## 2019-04-24 ENCOUNTER — Telehealth: Payer: Self-pay | Admitting: *Deleted

## 2019-04-24 ENCOUNTER — Encounter: Payer: Self-pay | Admitting: Internal Medicine

## 2019-04-24 NOTE — Telephone Encounter (Signed)
Called [phone] and left a messaged we tried to reach pt for a follow up call. maw

## 2019-04-24 NOTE — Telephone Encounter (Signed)
Attempted f/u phone call. No answer. Left message. °

## 2019-04-30 ENCOUNTER — Telehealth: Payer: Self-pay | Admitting: Internal Medicine

## 2019-04-30 NOTE — Telephone Encounter (Signed)
Pt's wife reported that pt is experiencing recurring bouts of diarrhea.  She stated that pt had discussed with Dr. Henrene Pastor after his colonoscopy 4/13 about having an EGD.  Is it OK to book a direct EGD or pt needs an OV first?

## 2019-04-30 NOTE — Telephone Encounter (Signed)
Left message to call back  

## 2019-04-30 NOTE — Telephone Encounter (Signed)
Pt calling and wants to be scheduled to see Dr. Henrene Pastor. Pt has been having diarrhea and it has not gotten any better. Pt scheduled to see Dr. Henrene Pastor 05/08/19 at 8:40am. Pts wife aware of appt.

## 2019-05-07 ENCOUNTER — Ambulatory Visit: Payer: Medicare Other | Admitting: Internal Medicine

## 2019-05-07 DIAGNOSIS — R972 Elevated prostate specific antigen [PSA]: Secondary | ICD-10-CM | POA: Diagnosis not present

## 2019-05-08 ENCOUNTER — Ambulatory Visit (INDEPENDENT_AMBULATORY_CARE_PROVIDER_SITE_OTHER): Payer: Medicare Other | Admitting: Internal Medicine

## 2019-05-08 ENCOUNTER — Encounter: Payer: Self-pay | Admitting: Internal Medicine

## 2019-05-08 ENCOUNTER — Other Ambulatory Visit: Payer: Self-pay

## 2019-05-08 VITALS — BP 130/84 | HR 76 | Temp 97.9°F | Ht 67.5 in | Wt 230.0 lb

## 2019-05-08 DIAGNOSIS — R194 Change in bowel habit: Secondary | ICD-10-CM | POA: Diagnosis not present

## 2019-05-08 DIAGNOSIS — R197 Diarrhea, unspecified: Secondary | ICD-10-CM | POA: Diagnosis not present

## 2019-05-08 DIAGNOSIS — Z86010 Personal history of colonic polyps: Secondary | ICD-10-CM

## 2019-05-08 NOTE — Patient Instructions (Signed)
Discontinue Colace.    Begin 2 tablespoons of Metamucil daily

## 2019-05-08 NOTE — Progress Notes (Signed)
HISTORY OF PRESENT ILLNESS:  Walter White. is a 69 y.o. male who presents to the for evaluation of change in bowel habits over the past 6 to 12 months.  He reports that with certain food items such as spicy foods or hot dogs he might experience gastric upset which is followed by diarrhea the next day.  This occurs once every few weeks.  No nausea, vomiting, bleeding, or weight loss.  He did undergo complete colonoscopy April 22, 2019 for polyp surveillance.  He was found to have 4 diminutive adenomas and internal hemorrhoids.  Otherwise normal exam.  Follow-up in 3 years recommended.  Patient did undergo CT scan of the abdomen and pelvis without contrast August 2019.  No acute findings.  Laboratories from March 31, 2019 show unremarkable comprehensive metabolic panel.  CBC with hemoglobin 12.0.  Patient does tell me that he felt like his Crestor resulted in gastric upset.  He does take Colace daily.  This was recommended to avoid constipation as he is on tramadol.  He has no constipation.  He has stopped this for about 1 week and thinks that this is helping.  No other complaints  REVIEW OF SYSTEMS:  All non-GI ROS negative unless otherwise stated in the HPI except for back pain  Past Medical History:  Diagnosis Date  . AAA (abdominal aortic aneurysm) (HCC)    4.0 cm AAA 08/22/17 U/S  . Allergy    Allergic Rhinitis  . Arthritis    spinal steniosis  . Benign prostate hyperplasia    with lower urinary tract symptoms  . Carpal tunnel syndrome on right   . Cataracts, both eyes   . Chronic back pain    caused by spinal stenosis  . Elevated PSA   . GERD (gastroesophageal reflux disease)   . Hyperlipidemia   . Hypertension    Essential.    . MGUS (monoclonal gammopathy of unknown significance)   . Microhematuria   . OA (osteoarthritis) of knee   . Pneumonia    Hx -when very young  . Pre-diabetes   . Seasonal allergies   . Spinal stenosis    of lumbosacral region.  Causes chronic back  pain.  [name] Upper abdominal pain     Past Surgical History:  Procedure Laterality Date  . ABDOMINAL EXPOSURE N/A 09/05/2017   Procedure: ABDOMINAL EXPOSURE;  Surgeon: Rosetta Posner, MD;  Location[facility] OR;  Service: Vascular;  Laterality: N/A;  . ANTERIOR LAT LUMBAR FUSION Left 01/26/2016   Procedure: LEFT SIDED LATERAL INTERBODY FUSION, LUMBAR 3-4 WITH INSTRUMENTATION AND ALLOGRAFT;  Surgeon: Phylliss Bob, MD;  Location: [place];  Service: Orthopedics;  Laterality: Left;  LEFT SIDED LATERAL INTERBODY FUSION, LUMBAR 3-4 WITH INSTRUMENTATION AND ALLOGRAFT  . ANTERIOR LAT LUMBAR FUSION Right 08/14/2018   Procedure: RIGHT LUMBAR two to LUMBAR three LATERAL INTERBODY FUSION WITH INSTRUMENTATION AND ALLOGRAFT;  Surgeon: Phylliss Bob, MD;  Location: [place];  Service: Orthopedics;  Laterality: Right;  . ANTERIOR LATERAL LUMBAR FUSION WITH PERCUTANEOUS SCREW 1 LEVEL N/A 09/05/2017   Procedure: LUMBAR 5 - SACRUM 1 ANTERIOR LUMBAR INTERBODY FUSION WITH INSTRUMENTATION AND ALLOGRAFT;  Surgeon: Phylliss Bob, MD;  Location: [place];  Service: Orthopedics;  Laterality: N/A;  . BACK SURGERY  01/2015   dr. Lynann Bologna. [id number].  [name] CARPAL TUNNEL RELEASE     right   . CATARACT EXTRACTION, BILATERAL    . COLONOSCOPY    . COLONOSCOPY  [id number]   polyp  . ELBOW SURGERY  right ulnar release  . ELBOW SURGERY     LEFT CTR  . JOINT REPLACEMENT     left- Sept 2016  . KNEE ARTHROSCOPY  11/23/2010   Procedure: ARTHROSCOPY KNEE;  Surgeon: Kerin Salen;  Location: [place];  Service: Orthopedics;  Laterality: Left;  arthroscopy left knee  . KNEE ARTHROSCOPY     left x 2  . Knee scar tissue removal Left 2018  . LATERAL FUSION LUMBAR SPINE  01./17/2018  . polypectomy  2010  . SHOULDER ARTHROSCOPY Left    x2  . TONSILLECTOMY    . TOTAL KNEE ARTHROPLASTY Left 09/21/2014   Procedure: TOTAL KNEE ARTHROPLASTY;  Surgeon: Frederik Pear, MD;  Location: [place];  Service: Orthopedics;  Laterality: Left;  .  ULNAR NERVE TRANSPOSITION Left 04/16/2017   Procedure: LEFT ULNAR NEUROPLASTY AT THE WRIST AND TRANSPOSITION AT THE ELBOW;  Surgeon: Milly Jakob, MD;  Location: [place];  Service: Orthopedics;  Laterality: Left;  . [place] EXTRACTION      Social History [place].  reports that he quit smoking about 38 years ago. He has never used smokeless tobacco. He reports current alcohol use. He reports that he does not use drugs.  family history includes Cancer in his mother; Colon cancer (age of onset: 64) in his maternal uncle; Liver disease in his father.  Allergies  Allergen Reactions  . Pork-Derived Products Shortness Of Breath  . Shellfish Allergy Shortness Of Breath  . Lovastatin Other (See Comments)    Bone pain   . Penicillins     UNSPECIFIED CHILDHOOD REACTION  Has patient had a PCN reaction causing immediate rash, facial/tongue/throat swelling, SOB or lightheadedness with hypotension: Unknown Has patient had a PCN reaction causing severe rash involving mucus membranes or skin necrosis: Unknown Has patient had a PCN reaction that required hospitalization: Unknown Has patient had a PCN reaction occurring within the last 10 years: No If all of the above answers are "NO", then may proceed with Cephalosporin use.        PHYSICAL EXAMINATION: Vital signs: BP 130/84   Pulse 76   Temp 97.9 F (36.6 C)   Ht 5' 7.5" (1.715 m)   Wt 230 lb (104.3 kg)   BMI 35.49 kg/m   Constitutional: generally well-appearing, no acute distress Psychiatric: alert and oriented x3, cooperative Eyes: extraocular movements intact, anicteric, conjunctiva pink Mouth: oral pharynx moist, no lesions Neck: supple no lymphadenopathy Cardiovascular: heart regular rate and rhythm, no murmur Lungs: clear to auscultation bilaterally Abdomen: soft, nontender, nondistended, no obvious ascites, no peritoneal signs, normal bowel sounds, no organomegaly Rectal: Normal at recent  colonoscopy Extremities: no clubbing, cyanosis, or lower extremity edema bilaterally Skin: no lesions on visible extremities Neuro: No focal deficits.  Cranial nerves intact  ASSESSMENT:  1.  Intermittent problems with gastric upset followed by diarrhea.  Chronic and stable.  No worrisome features.  May be related to dietary indiscretion.  Possibly medication reaction as noted above. 2.  History of adenomatous colon polyps.  Recent surveillance exam as noted  PLAN:  1.  Hold Colace 2.  Add Metamucil 2 tablespoons daily 3.  Hold Crestor for 1 month.  If his abdomen continues to do well then resume Crestor.  If more abdominal complaints ensue after resuming Crestor, then speak to his prescribing physician regarding alternative therapies, as this may explain his gastric upset. 4.  Surveillance colonoscopy in 3 years 5.  Office follow-up in 6 months. A  total time of 30 minutes was spent preparing to see the patient, reviewing test and x-rays and endoscopy reports, obtaining comprehensive history and performing comprehensive physical exam, counseling the patient regarding his above listed issues, making therapeutic recommendations regarding his medications and documenting clinical information in the health record

## 2019-05-29 DIAGNOSIS — M25551 Pain in right hip: Secondary | ICD-10-CM | POA: Diagnosis not present

## 2019-05-29 DIAGNOSIS — M25552 Pain in left hip: Secondary | ICD-10-CM | POA: Diagnosis not present

## 2019-05-29 DIAGNOSIS — M1711 Unilateral primary osteoarthritis, right knee: Secondary | ICD-10-CM | POA: Diagnosis not present

## 2019-06-26 DIAGNOSIS — M1711 Unilateral primary osteoarthritis, right knee: Secondary | ICD-10-CM | POA: Diagnosis not present

## 2019-06-27 DIAGNOSIS — M47816 Spondylosis without myelopathy or radiculopathy, lumbar region: Secondary | ICD-10-CM | POA: Diagnosis not present

## 2019-07-24 DIAGNOSIS — M47816 Spondylosis without myelopathy or radiculopathy, lumbar region: Secondary | ICD-10-CM | POA: Diagnosis not present

## 2019-08-25 DIAGNOSIS — M545 Low back pain: Secondary | ICD-10-CM | POA: Diagnosis not present

## 2019-09-09 DIAGNOSIS — Z79899 Other long term (current) drug therapy: Secondary | ICD-10-CM | POA: Diagnosis not present

## 2019-09-09 DIAGNOSIS — E559 Vitamin D deficiency, unspecified: Secondary | ICD-10-CM | POA: Diagnosis not present

## 2019-09-09 DIAGNOSIS — M129 Arthropathy, unspecified: Secondary | ICD-10-CM | POA: Diagnosis not present

## 2019-09-09 DIAGNOSIS — M5136 Other intervertebral disc degeneration, lumbar region: Secondary | ICD-10-CM | POA: Diagnosis not present

## 2019-09-23 DIAGNOSIS — M5136 Other intervertebral disc degeneration, lumbar region: Secondary | ICD-10-CM | POA: Diagnosis not present

## 2019-09-23 DIAGNOSIS — G894 Chronic pain syndrome: Secondary | ICD-10-CM | POA: Diagnosis not present

## 2019-09-23 DIAGNOSIS — Z79899 Other long term (current) drug therapy: Secondary | ICD-10-CM | POA: Diagnosis not present

## 2019-09-23 DIAGNOSIS — E559 Vitamin D deficiency, unspecified: Secondary | ICD-10-CM | POA: Diagnosis not present

## 2019-10-01 ENCOUNTER — Inpatient Hospital Stay: Payer: Medicare Other

## 2019-10-08 ENCOUNTER — Ambulatory Visit: Payer: Medicare Other | Admitting: Oncology

## 2019-10-10 ENCOUNTER — Inpatient Hospital Stay: Payer: Medicare Other | Attending: Oncology

## 2019-10-10 ENCOUNTER — Other Ambulatory Visit: Payer: Self-pay

## 2019-10-10 DIAGNOSIS — D472 Monoclonal gammopathy: Secondary | ICD-10-CM | POA: Insufficient documentation

## 2019-10-10 DIAGNOSIS — Z79899 Other long term (current) drug therapy: Secondary | ICD-10-CM | POA: Diagnosis not present

## 2019-10-10 DIAGNOSIS — D649 Anemia, unspecified: Secondary | ICD-10-CM | POA: Diagnosis not present

## 2019-10-10 LAB — COMPREHENSIVE METABOLIC PANEL
ALT: 26 U/L (ref 0–44)
AST: 31 U/L (ref 15–41)
Albumin: 3.7 g/dL (ref 3.5–5.0)
Alkaline Phosphatase: 89 U/L (ref 38–126)
Anion gap: 3 — ABNORMAL LOW (ref 5–15)
BUN: 25 mg/dL — ABNORMAL HIGH (ref 8–23)
CO2: 27 mmol/L (ref 22–32)
Calcium: 9.4 mg/dL (ref 8.9–10.3)
Chloride: 105 mmol/L (ref 98–111)
Creatinine, Ser: 1.2 mg/dL (ref 0.61–1.24)
GFR calc Af Amer: >60 mL/min (ref >60)
GFR calc non Af Amer: >60 mL/min (ref >60)
Glucose, Bld: 169 mg/dL — ABNORMAL HIGH (ref 70–99)
Potassium: 4.1 mmol/L (ref 3.5–5.1)
Sodium: 135 mmol/L (ref 135–145)
Total Bilirubin: 0.7 mg/dL (ref 0.3–1.2)
Total Protein: 8.6 g/dL — ABNORMAL HIGH (ref 6.5–8.1)

## 2019-10-10 LAB — PROTEIN / CREATININE RATIO, URINE
Creatinine, Urine: 160.41 mg/dL
Protein Creatinine Ratio: 0.06 mg/mg{Cre} (ref 0.00–0.15)
Total Protein, Urine: 10 mg/dL

## 2019-10-10 LAB — CBC WITH DIFFERENTIAL/PLATELET
Abs Immature Granulocytes: 0 10*3/uL (ref 0.00–0.07)
Basophils Absolute: 0 10*3/uL (ref 0.0–0.1)
Basophils Relative: 1 %
Eosinophils Absolute: 0.1 10*3/uL (ref 0.0–0.5)
Eosinophils Relative: 4 %
HCT: 35.3 % — ABNORMAL LOW (ref 39.0–52.0)
Hemoglobin: 11.8 g/dL — ABNORMAL LOW (ref 13.0–17.0)
Immature Granulocytes: 0 %
Lymphocytes Relative: 30 %
Lymphs Abs: 0.8 10*3/uL (ref 0.7–4.0)
MCH: 31.1 pg (ref 26.0–34.0)
MCHC: 33.4 g/dL (ref 30.0–36.0)
MCV: 92.9 fL (ref 80.0–100.0)
Monocytes Absolute: 0.3 10*3/uL (ref 0.1–1.0)
Monocytes Relative: 9 %
Neutro Abs: 1.5 10*3/uL — ABNORMAL LOW (ref 1.7–7.7)
Neutrophils Relative %: 56 %
Platelets: 130 10*3/uL — ABNORMAL LOW (ref 150–400)
RBC: 3.8 MIL/uL — ABNORMAL LOW (ref 4.22–5.81)
RDW: 13.1 % (ref 11.5–15.5)
WBC: 2.8 10*3/uL — ABNORMAL LOW (ref 4.0–10.5)
nRBC: 0 % (ref 0.0–0.2)

## 2019-10-13 LAB — KAPPA/LAMBDA LIGHT CHAINS
Kappa free light chain: 66.7 mg/L — ABNORMAL HIGH (ref 3.3–19.4)
Kappa, lambda light chain ratio: 5.38 — ABNORMAL HIGH (ref 0.26–1.65)
Lambda free light chains: 12.4 mg/L (ref 5.7–26.3)

## 2019-10-13 LAB — MULTIPLE MYELOMA PANEL, SERUM
Albumin SerPl Elph-Mcnc: 3.7 g/dL (ref 2.9–4.4)
Albumin/Glob SerPl: 0.9 (ref 0.7–1.7)
Alpha 1: 0.3 g/dL (ref 0.0–0.4)
Alpha2 Glob SerPl Elph-Mcnc: 0.8 g/dL (ref 0.4–1.0)
B-Globulin SerPl Elph-Mcnc: 1 g/dL (ref 0.7–1.3)
Gamma Glob SerPl Elph-Mcnc: 2.5 g/dL — ABNORMAL HIGH (ref 0.4–1.8)
Globulin, Total: 4.6 g/dL — ABNORMAL HIGH (ref 2.2–3.9)
IgA: 16 mg/dL — ABNORMAL LOW (ref 61–437)
IgG (Immunoglobin G), Serum: 2851 mg/dL — ABNORMAL HIGH (ref 603–1613)
IgM (Immunoglobulin M), Srm: 34 mg/dL (ref 15–143)
M Protein SerPl Elph-Mcnc: 2.1 g/dL — ABNORMAL HIGH
Total Protein ELP: 8.3 g/dL (ref 6.0–8.5)

## 2019-10-16 DIAGNOSIS — Z23 Encounter for immunization: Secondary | ICD-10-CM | POA: Diagnosis not present

## 2019-10-16 DIAGNOSIS — H0102B Squamous blepharitis left eye, upper and lower eyelids: Secondary | ICD-10-CM | POA: Diagnosis not present

## 2019-10-16 DIAGNOSIS — H26492 Other secondary cataract, left eye: Secondary | ICD-10-CM | POA: Diagnosis not present

## 2019-10-16 DIAGNOSIS — H0102A Squamous blepharitis right eye, upper and lower eyelids: Secondary | ICD-10-CM | POA: Diagnosis not present

## 2019-10-16 DIAGNOSIS — H04123 Dry eye syndrome of bilateral lacrimal glands: Secondary | ICD-10-CM | POA: Diagnosis not present

## 2019-10-16 DIAGNOSIS — Z961 Presence of intraocular lens: Secondary | ICD-10-CM | POA: Diagnosis not present

## 2019-10-16 NOTE — Progress Notes (Signed)
[place]  [phone] [phone]    ID: Walter White. DOB: [birth year]  MR#: [id number]  BSJ#:[id number]  Patient Care Team: Walter Pretty, MD as PCP - General (Internal Medicine) Walter Rhodes, MD as Consulting Physician (Urology) Walter Pear, MD as Consulting Physician (Orthopedic Surgery) Walter Bob, MD as Consulting Physician (Orthopedic Surgery) Walter White, Walter Dad, MD as Consulting Physician (Hematology and Oncology) OTHER MD:   CHIEF COMPLAINT: M-GUS  CURRENT TREATMENT: observation   INTERVAL HISTORY: Walter White returns today for follow-up of his M-GUS. He continues under observation.    Since his last visit, he underwent colonoscopy on 04/22/2019 under Walter White. Pathology from the four removed polyps ([id number]) showed tubular adenomas. Repeat colonoscopy recommended in 3 years.  He has not undergone any scans since his last visit.  We are following his monoclonal spike and his kappa lambda ratio.  Both show no significant trend: Results for Walter White, Walter White. "[name]" (MRN [id number]) as of 10/17/2019 10:41  Ref. Range 03/19/2018 10:47 09/30/2018 14:36 03/31/2019 11:45 10/10/2019 08:41  M Protein SerPl Elph-Mcnc Latest Ref Range: Not Observed g/dL 2.1 (H) 2.1 (H) 2.2 (H) 2.1 (H)   Results for Walter White, Walter White. "[name]" (MRN [id number]) as of 10/17/2019 10:41  Ref. Range 03/19/2018 10:47 09/30/2018 14:35 03/31/2019 11:45 10/10/2019 08:41  Kappa, lamda light chain ratio Latest Ref Range: 0.26 - 1.65  3.35 (H) 4.78 (H) 4.08 (H) 5.38 (H)   REVIEW OF SYSTEMS: Walter White has significant disability from back pain.  He had a prior surgery and has been told by orthopedics that further surgery is not possible.  He was referred to a pain clinic and he is very dissatisfied with the options they are giving him which is just narcotics, with no other possibilities.  He has had a Pfizer vaccine x3 with good tolerance.  He is not able to walk a mile.  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  Aside from these issues a detailed review of systems today was stable   HISTORY OF CURRENT ILLNESS: From the original intake note:   Walter White. is followed by Walter White, who noted a high total protein at 8.8 with an albumin of 4.0.  An SPEP was obtained on 03/05/2018, showing an M spike of 2.3.  Immunofixation was not performed.  The patient was also found to be mildly anemic, and to have a slightly low white cell count.  Given these findings he is being referred for evaluation of plasma cell dyscrasia, possibly myeloma.  The patient's subsequent history is as detailed below.   PAST MEDICAL HISTORY: Past Medical History:  Diagnosis Date  . AAA (abdominal aortic aneurysm) (HCC)    4.0 cm AAA 08/22/17 U/S  . Allergy    Allergic Rhinitis  . Arthritis    spinal steniosis  . Benign prostate hyperplasia    with lower urinary tract symptoms  . Carpal tunnel syndrome on right   . Cataracts, both eyes   . Chronic back pain    caused by spinal stenosis  . Elevated PSA   . GERD (gastroesophageal reflux disease)   . Hyperlipidemia   . Hypertension    Essential.    . MGUS (monoclonal gammopathy of unknown significance)   . Microhematuria   . OA (osteoarthritis) of knee   . Pneumonia    Hx -when very young  . Pre-diabetes   . Seasonal allergies   .  Spinal stenosis    of lumbosacral region.  Causes chronic back pain.  [name] Upper abdominal pain     PAST SURGICAL HISTORY: Past Surgical History:  Procedure Laterality Date  . ABDOMINAL EXPOSURE N/A 09/05/2017   Procedure: ABDOMINAL EXPOSURE;  Surgeon: Walter Posner, MD;  Location: [facility] OR;  Service: Vascular;  Laterality: N/A;  . ANTERIOR LAT LUMBAR FUSION Left 01/26/2016   Procedure: LEFT SIDED LATERAL INTERBODY FUSION, LUMBAR 3-4 WITH INSTRUMENTATION AND ALLOGRAFT;  Surgeon: Walter Bob, MD;  Location: [place];  Service: Orthopedics;  Laterality: Left;  LEFT SIDED  LATERAL INTERBODY FUSION, LUMBAR 3-4 WITH INSTRUMENTATION AND ALLOGRAFT  . ANTERIOR LAT LUMBAR FUSION Right 08/14/2018   Procedure: RIGHT LUMBAR two to LUMBAR three LATERAL INTERBODY FUSION WITH INSTRUMENTATION AND ALLOGRAFT;  Surgeon: Walter Bob, MD;  Location: [place];  Service: Orthopedics;  Laterality: Right;  . ANTERIOR LATERAL LUMBAR FUSION WITH PERCUTANEOUS SCREW 1 LEVEL N/A 09/05/2017   Procedure: LUMBAR 5 - SACRUM 1 ANTERIOR LUMBAR INTERBODY FUSION WITH INSTRUMENTATION AND ALLOGRAFT;  Surgeon: Walter Bob, MD;  Location: [place];  Service: Orthopedics;  Laterality: N/A;  . BACK SURGERY  01/2015   dr. Lynann White. [phone].  [name] CARPAL TUNNEL RELEASE     right   . CATARACT EXTRACTION, BILATERAL    . COLONOSCOPY    . COLONOSCOPY  [phone]   polyp  . ELBOW SURGERY     right ulnar release  . ELBOW SURGERY     LEFT CTR  . JOINT REPLACEMENT     left- Sept 2016  . KNEE ARTHROSCOPY  11/23/2010   Procedure: ARTHROSCOPY KNEE;  Surgeon: Walter White;  Location: [place];  Service: Orthopedics;  Laterality: Left;  arthroscopy left knee  . KNEE ARTHROSCOPY     left x 2  . Knee scar tissue removal Left 2018  . LATERAL FUSION LUMBAR SPINE  01./17/2018  . polypectomy  2010  . SHOULDER ARTHROSCOPY Left    x2  . TONSILLECTOMY    . TOTAL KNEE ARTHROPLASTY Left 09/21/2014   Procedure: TOTAL KNEE ARTHROPLASTY;  Surgeon: Walter Pear, MD;  Location: [place];  Service: Orthopedics;  Laterality: Left;  . ULNAR NERVE TRANSPOSITION Left 04/16/2017   Procedure: LEFT ULNAR NEUROPLASTY AT THE WRIST AND TRANSPOSITION AT THE ELBOW;  Surgeon: Walter Jakob, MD;  Location: [place];  Service: Orthopedics;  Laterality: Left;  . WISDOM TOOTH EXTRACTION      FAMILY HISTORY: Family History  Problem Relation Age of Onset  . Cancer Mother   . Liver disease Father   . Colon cancer Maternal Uncle 70  . Colon polyps Neg Hx   . Esophageal cancer Neg Hx   . Rectal cancer Neg Hx     . Stomach cancer Neg Hx    Walter White's father died from heart failure at age 53. Patients' mother died from lung cancer at age 45. The patient has 1 sister, who is deceased due to a motor vehicle accident. Patient denies anyone in her family having breast, ovarian, prostate, or pancreatic cancer.    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 His wife, Walter White, is a retired Pharmacist, hospital. Walter White's son Walter White graduated with his PhD in History, but he currently works at [employer] in [employer]. Abbott's other son Walter White, is a Counsellor for [employer] and coaches football at [employer].  Walter White developed a pulmonary embolus in 2021, likely related to sedentary occupation and overweight.  He is recovering well from that.  Walter White has two grandchildren. He attends [employer].    ADVANCED DIRECTIVES: Tanya's wife, Walter White, is automatically his healthcare power of attorney.     HEALTH MAINTENANCE: Social History   Tobacco Use  . Smoking status: Former Smoker    Quit date: 11/21/1980    Years since quitting: 38.9  . Smokeless tobacco: Never Used  Vaping Use  . Vaping Use: Never used  Substance Use Topics  . Alcohol use: Yes    Alcohol/week: 0.0 standard drinks    Comment: Very rare  . Drug use: No    Colonoscopy: 04/2019 (Walter White), repeat 2024  Bone density:    Allergies  Allergen Reactions  . Pork-Derived Products Shortness Of Breath  . Shellfish Allergy Shortness Of Breath  . Lovastatin Other (See Comments)    Bone pain   . Penicillins     UNSPECIFIED CHILDHOOD REACTION  Has patient had a PCN reaction causing immediate rash, facial/tongue/throat swelling, SOB or lightheadedness with hypotension: Unknown Has patient had a PCN reaction causing severe rash involving mucus membranes or skin necrosis: Unknown Has patient had a PCN reaction that required hospitalization: Unknown Has patient had a PCN reaction  occurring within the last 10 years: No If all of the above answers are "NO", then may proceed with Cephalosporin use.     Current Outpatient Medications  Medication Sig Dispense Refill  . diclofenac (VOLTAREN) 75 MG EC tablet Take 75 mg by mouth 2 (two) times daily.    [name] docusate sodium (COLACE) 100 MG capsule Take 100 mg by mouth daily.     . Gabapentin (NEURONTIN PO) Take by mouth.    [name] lisinopril-hydrochlorothiazide (PRINZIDE,ZESTORETIC) 20-25 MG tablet Take 1 tablet by mouth daily.    . methocarbamol (ROBAXIN) 750 MG tablet Take 750 mg by mouth 2 (two) times a day.    . Nutritional Supplements (JUICE PLUS FIBRE PO) Take 6 capsules by mouth daily. Juice Plus+ Orchard Blend 2 capsules in the morning Juice Plus+ Garden Blend 2 capsules in the morning Juice Plus+ Vineyard Blend 2 capsule in the morning    . pantoprazole (PROTONIX) 40 MG tablet Take 40 mg by mouth daily.    . rosuvastatin (CRESTOR) 5 MG tablet Take 5 mg by mouth daily.    . Tamsulosin HCl (FLOMAX) 0.4 MG CAPS Take 0.4 mg by mouth at bedtime.     . Tetrahydrozoline HCl (VISINE OP) Place 1 drop into both eyes daily as needed (irritation).    [name] tiZANidine (ZANAFLEX) 4 MG tablet Take 4 mg by mouth at bedtime.    . traMADol (ULTRAM) 50 MG tablet Take 50 mg by mouth 2 (two) times daily as needed.     No current facility-administered medications for this visit.     OBJECTIVE: White man wearing a brace  Vitals:   10/17/19 1023  BP: (!) 143/69  Pulse: (!) 111  Resp: 18  Temp: 97.8 F (36.6 C)  SpO2: 98%   Wt Readings from Last 3 Encounters:  10/17/19 239 lb 4.8 oz (108.5 kg)  05/08/19 230 lb (104.3 kg)  04/22/19 234 lb (106.1 kg)   Body mass index is 36.93 kg/m.    ECOG FS:1 - Symptomatic but completely ambulatory  Sclerae unicteric, EOMs intact Wearing a mask No cervical or supraclavicular adenopathy Lungs no rales or rhonchi Heart regular rate and rhythm Abd soft, nontender, positive bowel sounds MSK no  focal spinal tenderness to moderate palpation Neuro: nonfocal,  well oriented, appropriate affect   LAB RESULTS:  CMP     Component Value Date/Time   NA 135 10/10/2019 0841   K 4.1 10/10/2019 0841   CL 105 10/10/2019 0841   CO2 27 10/10/2019 0841   GLUCOSE 169 (H) 10/10/2019 0841   BUN 25 (H) 10/10/2019 0841   CREATININE 1.20 10/10/2019 0841   CREATININE 1.01 03/19/2018 1047   CALCIUM 9.4 10/10/2019 0841   PROT 8.6 (H) 10/10/2019 0841   ALBUMIN 3.7 10/10/2019 0841   AST 31 10/10/2019 0841   AST 30 03/19/2018 1047   ALT 26 10/10/2019 0841   ALT 26 03/19/2018 1047   ALKPHOS 89 10/10/2019 0841   BILITOT 0.7 10/10/2019 0841   BILITOT 0.5 03/19/2018 1047   GFRNONAA >60 10/10/2019 0841   GFRNONAA >60 03/19/2018 1047   GFRAA >60 10/10/2019 0841   GFRAA >60 03/19/2018 1047    Lab Results  Component Value Date   TOTALPROTELP 8.3 10/10/2019   Lab Results  Component Value Date   WBC 2.8 (L) 10/10/2019   NEUTROABS 1.5 (L) 10/10/2019   HGB 11.8 (L) 10/10/2019   HCT 35.3 (L) 10/10/2019   MCV 92.9 10/10/2019   PLT 130 (L) 10/10/2019   No results found for: LABCA2  No components found for: ASTMHD622  No results for input(s): INR in the last 168 hours.  No results found for: LABCA2  No results found for: WLN989  No results found for: QJJ941  No results found for: DEY814  No results found for: CA2729  No components found for: HGQUANT  No results found for: CEA1 / No results found for: CEA1   No results found for: AFPTUMOR  No results found for: CHROMOGRNA  No results found for: PSA1  Lab Results  Component Value Date   KPAFRELGTCHN 66.7 (H) 10/10/2019   LAMBDASER 12.4 10/10/2019   KAPLAMBRATIO 5.38 (H) 10/10/2019   (kappa/lambda light chains)  No results found for: HGBA, HGBA2QUANT, HGBFQUANT, HGBSQUAN (Hemoglobinopathy evaluation)   Lab Results  Component Value Date   LDH 220 (H) 03/31/2019    Lab Results  Component Value Date   IRON 82  03/31/2019   TIBC 359 03/31/2019   IRONPCTSAT 23 03/31/2019   (Iron and TIBC)  Lab Results  Component Value Date   FERRITIN 588 (H) 03/31/2019    Urinalysis    Component Value Date/Time   COLORURINE YELLOW 08/07/2018 [place] 08/07/2018 0944   LABSPEC 1.019 08/07/2018 [place] 5.0 08/07/2018 [place] 08/07/2018 0944   HGBUR NEGATIVE 08/07/2018 0944   BILIRUBINUR NEGATIVE 08/07/2018 0944   KETONESUR NEGATIVE 08/07/2018 0944   PROTEINUR NEGATIVE 08/07/2018 0944   UROBILINOGEN 0.2 09/10/2014 0852   NITRITE NEGATIVE 08/07/2018 0944   LEUKOCYTESUR MODERATE (A) 08/07/2018 0944     STUDIES:  No results found.   ELIGIBLE FOR AVAILABLE RESEARCH PROTOCOL: no   ASSESSMENT: 69 y.o. [place], [place] man with a diagnosis of M-GUS confirmed March 2020 with the following parameters:  2.1 g M spike on SPEP obtained 03/05/2018, IgG kappa by IFE  Kappa/lambda ratio 3.35, kappa at 32.8 mg/L  Beta-2-microglobulin 2.0  IgG/A/M 2606/21/38  WBC 3.2, ANC 2.4, Hb 11.6, MCV 92.0, platelets 155  Creat 1.01, Ca++ 9.7, alb 4.0  Bone studies: no lytic lesions  Bone marrow biopsy 04/02/2018: 9% plasma cells, kappa restricted  (1) anemia: Consistent with "anemia of chronic disease"   (a) normal reticulocytes, folate, B12, and iron studies March 2021  PLAN: [name]'s MGUS is very stable.  It does not require any intervention at present.  We are going to continue to follow his lab work every 6 months with visits every year.  He would be interested in a second opinion from neurosurgery regarding his prior back surgery, whether further surgery is possible or would be helpful, and if not whether there are other options then narcotics to control his back pain.  I am placing a referral to [place] neurosurgery, specifically [facility], in hopes that they can offer him some help.  In the meantime I have suggested he try Aleve 220 mg with Tylenol 500 mg taken together  up to 3 times a day as a baseline for pain control  We discussed his very mild anemia which is going to be "anemia of chronic illness".  This is not causing him any problems and it is not progressive  Total encounter time 25 minutes.*   Lennox Dolberry, Walter Dad, MD  10/17/19 10:44 AM Medical Oncology and Hematology [facility] [place], [place] [address] Tel. [phone]    Fax. [phone]   I, Wilburn Mylar, am acting as scribe for Dr. Virgie White. Parris Signer.  I, Lurline Del MD, have reviewed the above documentation for accuracy and completeness, and I agree with the above.    *Total Encounter Time as defined by the Centers for Medicare and Medicaid Services includes, in addition to the face-to-face time of a patient visit (documented in the note above) non-face-to-face time: obtaining and reviewing outside history, ordering and reviewing medications, tests or procedures, care coordination (communications with other health care professionals or caregivers) and documentation in the medical record.

## 2019-10-17 ENCOUNTER — Inpatient Hospital Stay (HOSPITAL_BASED_OUTPATIENT_CLINIC_OR_DEPARTMENT_OTHER): Payer: Medicare Other | Admitting: Oncology

## 2019-10-17 ENCOUNTER — Other Ambulatory Visit: Payer: Self-pay

## 2019-10-17 ENCOUNTER — Encounter: Payer: Self-pay | Admitting: Oncology

## 2019-10-17 VITALS — BP 143/69 | HR 111 | Temp 97.8°F | Resp 18 | Ht 67.5 in | Wt 239.3 lb

## 2019-10-17 DIAGNOSIS — D649 Anemia, unspecified: Secondary | ICD-10-CM | POA: Diagnosis not present

## 2019-10-17 DIAGNOSIS — D472 Monoclonal gammopathy: Secondary | ICD-10-CM

## 2019-10-17 DIAGNOSIS — Z79899 Other long term (current) drug therapy: Secondary | ICD-10-CM | POA: Diagnosis not present

## 2019-10-21 ENCOUNTER — Telehealth: Payer: Self-pay | Admitting: Oncology

## 2019-10-21 DIAGNOSIS — G894 Chronic pain syndrome: Secondary | ICD-10-CM | POA: Diagnosis not present

## 2019-10-21 DIAGNOSIS — M5136 Other intervertebral disc degeneration, lumbar region: Secondary | ICD-10-CM | POA: Diagnosis not present

## 2019-10-21 DIAGNOSIS — Z79899 Other long term (current) drug therapy: Secondary | ICD-10-CM | POA: Diagnosis not present

## 2019-10-21 NOTE — Telephone Encounter (Signed)
Scheduled per 10/8 los. Called and left a msg. Referral sent via [facility]

## 2019-10-29 DIAGNOSIS — Z23 Encounter for immunization: Secondary | ICD-10-CM | POA: Diagnosis not present

## 2019-11-13 DIAGNOSIS — S63619A Unspecified sprain of unspecified finger, initial encounter: Secondary | ICD-10-CM | POA: Diagnosis not present

## 2019-11-17 DIAGNOSIS — M4316 Spondylolisthesis, lumbar region: Secondary | ICD-10-CM | POA: Diagnosis not present

## 2019-11-18 ENCOUNTER — Other Ambulatory Visit: Payer: Self-pay | Admitting: Neurosurgery

## 2019-11-18 ENCOUNTER — Other Ambulatory Visit (HOSPITAL_COMMUNITY): Payer: Self-pay | Admitting: Neurosurgery

## 2019-11-18 DIAGNOSIS — M4316 Spondylolisthesis, lumbar region: Secondary | ICD-10-CM

## 2019-11-21 DIAGNOSIS — M5136 Other intervertebral disc degeneration, lumbar region: Secondary | ICD-10-CM | POA: Diagnosis not present

## 2019-11-21 DIAGNOSIS — G894 Chronic pain syndrome: Secondary | ICD-10-CM | POA: Diagnosis not present

## 2019-11-21 DIAGNOSIS — Z79899 Other long term (current) drug therapy: Secondary | ICD-10-CM | POA: Diagnosis not present

## 2019-11-27 ENCOUNTER — Ambulatory Visit (HOSPITAL_COMMUNITY)
Admission: RE | Admit: 2019-11-27 | Discharge: 2019-11-27 | Disposition: A | Discharge status: Home / Self Care | Payer: Medicare Other | Source: Ambulatory Visit | Attending: Neurosurgery | Admitting: Neurosurgery

## 2019-11-27 ENCOUNTER — Other Ambulatory Visit: Payer: Self-pay

## 2019-11-27 DIAGNOSIS — Z981 Arthrodesis status: Secondary | ICD-10-CM | POA: Insufficient documentation

## 2019-11-27 DIAGNOSIS — M4316 Spondylolisthesis, lumbar region: Secondary | ICD-10-CM | POA: Diagnosis not present

## 2019-11-27 DIAGNOSIS — M4326 Fusion of spine, lumbar region: Secondary | ICD-10-CM | POA: Diagnosis not present

## 2019-11-27 DIAGNOSIS — M5441 Lumbago with sciatica, right side: Secondary | ICD-10-CM | POA: Diagnosis not present

## 2019-11-27 DIAGNOSIS — M5136 Other intervertebral disc degeneration, lumbar region: Secondary | ICD-10-CM | POA: Diagnosis not present

## 2019-11-27 MED ORDER — ONDANSETRON HCL 4 MG/2ML IJ SOLN: 4.0000 mg | Freq: Four times a day (QID) | INTRAMUSCULAR | Status: DC | PRN

## 2019-11-27 MED ORDER — IOHEXOL 180 MG/ML  SOLN
20.0000 mL | Freq: Once | INTRAMUSCULAR | Status: AC | PRN
  Administered 2019-11-27: 20 mL via INTRATHECAL

## 2019-11-27 MED ORDER — DIAZEPAM 5 MG PO TABS
10.0000 mg | ORAL_TABLET | Freq: Once | ORAL | Status: AC
  Administered 2019-11-27: 10 mg via ORAL

## 2019-11-27 MED ORDER — OXYCODONE HCL 5 MG PO TABS: 5.0000 mg | ORAL_TABLET | ORAL | Status: DC | PRN

## 2019-11-27 MED ORDER — LIDOCAINE HCL (PF) 1 % IJ SOLN
5.0000 mL | Freq: Once | INTRAMUSCULAR | Status: AC
  Administered 2019-11-27: 5 mL via INTRADERMAL

## 2019-11-27 MED ORDER — DIAZEPAM 5 MG PO TABS
ORAL_TABLET | ORAL | Status: AC
  Filled 2019-11-27: qty 1

## 2019-11-27 NOTE — Discharge Instructions (Signed)

## 2019-11-27 NOTE — Progress Notes (Signed)
Discharge instructions reviewed with patient and family. Verbalized understanding. 

## 2019-11-27 NOTE — Op Note (Signed)
*   No surgery found * Lumbar Myelogram  PATIENT:  Walter White. is a 69 y.o. male  PRE-OPERATIVE DIAGNOSIS:  LUmbago with sciatica  POST-OPERATIVE DIAGNOSIS:  same  PROCEDURE:  Lumbar Myelogram  SURGEON:  Kolston Lacount  ANESTHESIA:   local LOCAL MEDICATIONS USED:  LIDOCAINE  and Amount: 6 ml Procedure Note: [name]. is a 69 y.o. male Was taken to the fluoroscopy suite and  positioned prone on the fluoroscopy table. His back was prepared and draped in a sterile manner. I infiltrated 6 cc lidocaine into the lumbar region. I then introduced a spinal needle into the thecal sac at the L3/4  interlaminar space. I infiltrated 20cc of Isovue 180 into the thecal sac. Fluoroscopy showed the needle and contrast in the thecal sac. Janene Harvey. tolerated the procedure well. he Will be taken to CT for evaluation.     PATIENT DISPOSITION:  Short Stay

## 2019-11-28 ENCOUNTER — Other Ambulatory Visit: Payer: Self-pay | Admitting: Oncology

## 2019-12-18 ENCOUNTER — Other Ambulatory Visit: Payer: Self-pay | Admitting: Neurosurgery

## 2019-12-18 DIAGNOSIS — I10 Essential (primary) hypertension: Secondary | ICD-10-CM | POA: Diagnosis not present

## 2019-12-18 DIAGNOSIS — M5136 Other intervertebral disc degeneration, lumbar region: Secondary | ICD-10-CM | POA: Diagnosis not present

## 2019-12-18 DIAGNOSIS — Z6837 Body mass index (BMI) 37.0-37.9, adult: Secondary | ICD-10-CM | POA: Diagnosis not present

## 2019-12-22 DIAGNOSIS — Z79899 Other long term (current) drug therapy: Secondary | ICD-10-CM | POA: Diagnosis not present

## 2019-12-22 DIAGNOSIS — M5136 Other intervertebral disc degeneration, lumbar region: Secondary | ICD-10-CM | POA: Diagnosis not present

## 2019-12-22 DIAGNOSIS — G894 Chronic pain syndrome: Secondary | ICD-10-CM | POA: Diagnosis not present

## 2019-12-30 DIAGNOSIS — M1711 Unilateral primary osteoarthritis, right knee: Secondary | ICD-10-CM | POA: Diagnosis not present

## 2019-12-30 DIAGNOSIS — M79644 Pain in right finger(s): Secondary | ICD-10-CM | POA: Diagnosis not present

## 2019-12-30 DIAGNOSIS — M65331 Trigger finger, right middle finger: Secondary | ICD-10-CM | POA: Diagnosis not present

## 2020-01-07 DIAGNOSIS — Z85828 Personal history of other malignant neoplasm of skin: Secondary | ICD-10-CM | POA: Diagnosis not present

## 2020-01-07 DIAGNOSIS — L570 Actinic keratosis: Secondary | ICD-10-CM | POA: Diagnosis not present

## 2020-01-07 DIAGNOSIS — X32XXXD Exposure to sunlight, subsequent encounter: Secondary | ICD-10-CM | POA: Diagnosis not present

## 2020-01-07 DIAGNOSIS — L820 Inflamed seborrheic keratosis: Secondary | ICD-10-CM | POA: Diagnosis not present

## 2020-01-07 DIAGNOSIS — Z08 Encounter for follow-up examination after completed treatment for malignant neoplasm: Secondary | ICD-10-CM | POA: Diagnosis not present

## 2020-01-13 NOTE — Progress Notes (Signed)
[address] [place] [address] [place] - [address] [place] [address] Phone: [phone] Fax: [phone]      Your procedure is scheduled on Friday, January 16, 2020.  Report to [facility] Main Entrance "A" at 06:00 A.M., and check in at the Admitting office.  Call this number if you have problems the morning of surgery:  [phone]  Call [phone] if you have any questions prior to your surgery date Monday-Friday 8am-4pm   Remember:  Do not eat or drink after midnight the night before your surgery    Take these medicines the morning of surgery with A SIP OF WATER : Gabapentin (NEURONTIN) HYDROcodone-acetaminophen (NORCO/VICODIN) Pantoprazole (PROTONIX) Rosuvastatin (CRESTOR)  As of today, STOP taking any Aspirin (unless otherwise instructed by your surgeon) Celecoxib (CELEBREX) Aleve, Naproxen, Ibuprofen, Motrin, Advil, Goody's, BC's, all herbal medications, fish oil, and all vitamins.                     Do not wear jewelry, make up.            Do not wear lotions, powders, colognes, or deodorant.            Do not shave 48 hours prior to surgery.  Men may shave face and neck.            Do not bring valuables to the hospital.            [facility] is not responsible for any belongings or valuables.  Do NOT Smoke (Tobacco/Vaping) or drink Alcohol 24 hours prior to your procedure If you use a CPAP at night, you may bring all equipment for your overnight stay.   Contacts, glasses, dentures or bridgework may not be worn into surgery.      For patients admitted to the hospital, discharge time will be determined by your treatment team.   Patients discharged the day of surgery will not be allowed to drive home, and someone needs to stay with them for 24 hours.    Special instructions:   [place]- Preparing For Surgery  Before surgery, you can play an important role. Because skin is not sterile, your skin needs to be as free of germs  as possible. You can reduce the number of germs on your skin by washing with CHG (chlorahexidine gluconate) Soap before surgery.  CHG is an antiseptic cleaner which kills germs and bonds with the skin to continue killing germs even after washing.    Oral Hygiene is also important to reduce your risk of infection.  Remember - BRUSH YOUR TEETH THE MORNING OF SURGERY WITH YOUR REGULAR TOOTHPASTE  Please do not use if you have an allergy to CHG or antibacterial soaps. If your skin becomes reddened/irritated stop using the CHG.  Do not shave (including legs and underarms) for at least 48 hours prior to first CHG shower. It is OK to shave your face.  Please follow these instructions carefully.   1. Shower the NIGHT BEFORE SURGERY and the MORNING OF SURGERY with CHG Soap.   2. If you chose to wash your hair, wash your hair first as usual with your normal shampoo.  3. After you shampoo, rinse your hair and body thoroughly to remove the shampoo.  4. Use CHG as you would any other liquid soap. You can apply CHG directly to the skin and wash gently with a scrungie or a clean washcloth.   5. Apply the CHG Soap to  your body ONLY FROM THE NECK DOWN.  Do not use on open wounds or open sores. Avoid contact with your eyes, ears, mouth and genitals (private parts). Wash Face and genitals (private parts)  with your normal soap.   6. Wash thoroughly, paying special attention to the area where your surgery will be performed.  7. Thoroughly rinse your body with warm water from the neck down.  8. DO NOT shower/wash with your normal soap after using and rinsing off the CHG Soap.  9. Pat yourself dry with a CLEAN TOWEL.  10. Wear CLEAN PAJAMAS to bed the night before surgery  11. Place CLEAN SHEETS on your bed the night of your first shower and DO NOT SLEEP WITH PETS.   Day of Surgery: Wear Clean/Comfortable clothing the morning of surgery Do not apply any deodorants/lotions.   Remember to brush your teeth  WITH YOUR REGULAR TOOTHPASTE.   Please read over the following fact sheets that you were given.

## 2020-01-14 ENCOUNTER — Encounter (HOSPITAL_COMMUNITY)
Admission: RE | Admit: 2020-01-14 | Discharge: 2020-01-14 | Disposition: A | Discharge status: Home / Self Care | Payer: Medicare Other | Source: Ambulatory Visit | Attending: Neurosurgery | Admitting: Neurosurgery

## 2020-01-14 ENCOUNTER — Encounter (HOSPITAL_COMMUNITY): Payer: Self-pay

## 2020-01-14 ENCOUNTER — Other Ambulatory Visit (HOSPITAL_COMMUNITY): Payer: Medicare Other

## 2020-01-14 ENCOUNTER — Other Ambulatory Visit: Payer: Self-pay

## 2020-01-14 DIAGNOSIS — M48062 Spinal stenosis, lumbar region with neurogenic claudication: Secondary | ICD-10-CM | POA: Diagnosis not present

## 2020-01-14 DIAGNOSIS — Z880 Allergy status to penicillin: Secondary | ICD-10-CM | POA: Diagnosis not present

## 2020-01-14 DIAGNOSIS — Z6837 Body mass index (BMI) 37.0-37.9, adult: Secondary | ICD-10-CM | POA: Diagnosis not present

## 2020-01-14 DIAGNOSIS — M1990 Unspecified osteoarthritis, unspecified site: Secondary | ICD-10-CM | POA: Diagnosis not present

## 2020-01-14 DIAGNOSIS — M4316 Spondylolisthesis, lumbar region: Secondary | ICD-10-CM | POA: Diagnosis not present

## 2020-01-14 DIAGNOSIS — Z981 Arthrodesis status: Secondary | ICD-10-CM | POA: Diagnosis not present

## 2020-01-14 DIAGNOSIS — I10 Essential (primary) hypertension: Secondary | ICD-10-CM | POA: Diagnosis not present

## 2020-01-14 DIAGNOSIS — Z87891 Personal history of nicotine dependence: Secondary | ICD-10-CM | POA: Diagnosis not present

## 2020-01-14 DIAGNOSIS — Z91013 Allergy to seafood: Secondary | ICD-10-CM | POA: Diagnosis not present

## 2020-01-14 DIAGNOSIS — Z20822 Contact with and (suspected) exposure to covid-19: Secondary | ICD-10-CM | POA: Diagnosis not present

## 2020-01-14 DIAGNOSIS — Z91014 Allergy to mammalian meats: Secondary | ICD-10-CM | POA: Diagnosis not present

## 2020-01-14 DIAGNOSIS — Z01812 Encounter for preprocedural laboratory examination: Secondary | ICD-10-CM | POA: Insufficient documentation

## 2020-01-14 HISTORY — DX: Anemia, unspecified: D64.9

## 2020-01-14 LAB — CBC
HCT: 37.6 % — ABNORMAL LOW (ref 39.0–52.0)
Hemoglobin: 12.2 g/dL — ABNORMAL LOW (ref 13.0–17.0)
MCH: 30.3 pg (ref 26.0–34.0)
MCHC: 32.4 g/dL (ref 30.0–36.0)
MCV: 93.5 fL (ref 80.0–100.0)
Platelets: 148 10*3/uL — ABNORMAL LOW (ref 150–400)
RBC: 4.02 MIL/uL — ABNORMAL LOW (ref 4.22–5.81)
RDW: 13.7 % (ref 11.5–15.5)
WBC: 4.1 10*3/uL (ref 4.0–10.5)
nRBC: 0 % (ref 0.0–0.2)

## 2020-01-14 LAB — SURGICAL PCR SCREEN
MRSA, PCR: NEGATIVE
Staphylococcus aureus: NEGATIVE

## 2020-01-14 LAB — BASIC METABOLIC PANEL
Anion gap: 9 (ref 5–15)
BUN: 31 mg/dL — ABNORMAL HIGH (ref 8–23)
CO2: 25 mmol/L (ref 22–32)
Calcium: 9.4 mg/dL (ref 8.9–10.3)
Chloride: 102 mmol/L (ref 98–111)
Creatinine, Ser: 1.1 mg/dL (ref 0.61–1.24)
GFR, Estimated: >60 mL/min (ref >60)
Glucose, Bld: 198 mg/dL — ABNORMAL HIGH (ref 70–99)
Potassium: 3.8 mmol/L (ref 3.5–5.1)
Sodium: 136 mmol/L (ref 135–145)

## 2020-01-14 LAB — TYPE AND SCREEN
ABO/RH(D): O POS
Antibody Screen: NEGATIVE

## 2020-01-14 NOTE — Progress Notes (Signed)
PCP - Merri Brunette  Cardiologist - denies  PPM/ICD - n/a  Chest x-ray - n/a EKG - 01/14/20 Stress Test -denies  ECHO - denies Cardiac Cath - denies  Sleep Study - denies  Blood Thinner Instructions: none  COVID TEST- 01/15/20  Anesthesia review: no  Patient denies shortness of breath, fever, cough and chest pain at PAT appointment   All instructions explained to the patient, with a verbal understanding of the material. Patient agrees to go over the instructions while at home for a better understanding. Patient also instructed to self quarantine after being tested for COVID-19. The opportunity to ask questions was provided.

## 2020-01-15 ENCOUNTER — Other Ambulatory Visit (HOSPITAL_COMMUNITY)
Admission: RE | Admit: 2020-01-15 | Discharge: 2020-01-15 | Disposition: A | Discharge status: Home / Self Care | Payer: Medicare Other | Source: Ambulatory Visit | Attending: Neurosurgery | Admitting: Neurosurgery

## 2020-01-15 DIAGNOSIS — Z01818 Encounter for other preprocedural examination: Secondary | ICD-10-CM | POA: Insufficient documentation

## 2020-01-15 DIAGNOSIS — Z20822 Contact with and (suspected) exposure to covid-19: Secondary | ICD-10-CM | POA: Insufficient documentation

## 2020-01-15 LAB — SARS CORONAVIRUS 2 (TAT 6-24 HRS): SARS Coronavirus 2: NEGATIVE

## 2020-01-15 NOTE — Anesthesia Preprocedure Evaluation (Addendum)
Anesthesia Evaluation  Patient identified by MRN, date of birth, ID band Patient awake    Reviewed: Allergy & Precautions, H&P , NPO status , Patient's Chart, lab work & pertinent test results  Airway Mallampati: II  TM Distance: >3 FB Neck ROM: Full    Dental no notable dental hx. (+) Teeth Intact, Dental Advisory Given   Pulmonary neg pulmonary ROS, former smoker,    Pulmonary exam normal breath sounds clear to auscultation       Cardiovascular Exercise Tolerance: Good hypertension, Pt. on medications  Rhythm:Regular Rate:Normal     Neuro/Psych negative neurological ROS  negative psych ROS   GI/Hepatic Neg liver ROS, GERD  Medicated,  Endo/Other  negative endocrine ROSMorbid obesity  Renal/GU negative Renal ROS  negative genitourinary   Musculoskeletal  (+) Arthritis , Osteoarthritis,    Abdominal   Peds  Hematology  (+) Blood dyscrasia, anemia ,   Anesthesia Other Findings   Reproductive/Obstetrics negative OB ROS                            Anesthesia Physical Anesthesia Plan  ASA: III  Anesthesia Plan: General   Post-op Pain Management:    Induction: Intravenous  PONV Risk Score and Plan: 3 and Ondansetron, Dexamethasone and Midazolam  Airway Management Planned: Oral ETT  Additional Equipment:   Intra-op Plan:   Post-operative Plan: Extubation in OR  Informed Consent: I have reviewed the patients History and Physical, chart, labs and discussed the procedure including the risks, benefits and alternatives for the proposed anesthesia with the patient or authorized representative who has indicated his/her understanding and acceptance.     Dental advisory given  Plan Discussed with: CRNA  Anesthesia Plan Comments:        Anesthesia Quick Evaluation

## 2020-01-16 ENCOUNTER — Inpatient Hospital Stay (HOSPITAL_COMMUNITY)
Admission: RE | Disposition: A | Discharge status: Home / Self Care | Payer: Self-pay | Source: Home / Self Care | Attending: Neurosurgery

## 2020-01-16 ENCOUNTER — Inpatient Hospital Stay (HOSPITAL_COMMUNITY)
Admission: RE | Admit: 2020-01-16 | Discharge: 2020-01-17 | DRG: 455 | Disposition: A | Discharge status: Home / Self Care | Payer: Medicare Other | Attending: Neurosurgery | Admitting: Neurosurgery

## 2020-01-16 ENCOUNTER — Inpatient Hospital Stay (HOSPITAL_COMMUNITY): Payer: Medicare Other | Admitting: Anesthesiology

## 2020-01-16 ENCOUNTER — Encounter (HOSPITAL_COMMUNITY): Payer: Self-pay | Admitting: Neurosurgery

## 2020-01-16 ENCOUNTER — Other Ambulatory Visit: Payer: Self-pay

## 2020-01-16 ENCOUNTER — Inpatient Hospital Stay (HOSPITAL_COMMUNITY): Payer: Medicare Other

## 2020-01-16 DIAGNOSIS — Z91014 Allergy to mammalian meats: Secondary | ICD-10-CM

## 2020-01-16 DIAGNOSIS — Z91013 Allergy to seafood: Secondary | ICD-10-CM

## 2020-01-16 DIAGNOSIS — M5136 Other intervertebral disc degeneration, lumbar region: Secondary | ICD-10-CM | POA: Diagnosis not present

## 2020-01-16 DIAGNOSIS — M1990 Unspecified osteoarthritis, unspecified site: Secondary | ICD-10-CM | POA: Diagnosis present

## 2020-01-16 DIAGNOSIS — Z79891 Long term (current) use of opiate analgesic: Secondary | ICD-10-CM

## 2020-01-16 DIAGNOSIS — M5135 Other intervertebral disc degeneration, thoracolumbar region: Secondary | ICD-10-CM | POA: Diagnosis not present

## 2020-01-16 DIAGNOSIS — M48062 Spinal stenosis, lumbar region with neurogenic claudication: Principal | ICD-10-CM | POA: Diagnosis present

## 2020-01-16 DIAGNOSIS — Z981 Arthrodesis status: Secondary | ICD-10-CM | POA: Diagnosis not present

## 2020-01-16 DIAGNOSIS — I10 Essential (primary) hypertension: Secondary | ICD-10-CM | POA: Diagnosis not present

## 2020-01-16 DIAGNOSIS — M4320 Fusion of spine, site unspecified: Secondary | ICD-10-CM | POA: Diagnosis present

## 2020-01-16 DIAGNOSIS — K219 Gastro-esophageal reflux disease without esophagitis: Secondary | ICD-10-CM | POA: Diagnosis not present

## 2020-01-16 DIAGNOSIS — Z6837 Body mass index (BMI) 37.0-37.9, adult: Secondary | ICD-10-CM

## 2020-01-16 DIAGNOSIS — Z880 Allergy status to penicillin: Secondary | ICD-10-CM | POA: Diagnosis not present

## 2020-01-16 DIAGNOSIS — M4316 Spondylolisthesis, lumbar region: Secondary | ICD-10-CM | POA: Diagnosis present

## 2020-01-16 DIAGNOSIS — M4306 Spondylolysis, lumbar region: Secondary | ICD-10-CM

## 2020-01-16 DIAGNOSIS — D649 Anemia, unspecified: Secondary | ICD-10-CM | POA: Diagnosis not present

## 2020-01-16 DIAGNOSIS — Z79899 Other long term (current) drug therapy: Secondary | ICD-10-CM

## 2020-01-16 DIAGNOSIS — Z87891 Personal history of nicotine dependence: Secondary | ICD-10-CM | POA: Diagnosis not present

## 2020-01-16 DIAGNOSIS — Z20822 Contact with and (suspected) exposure to covid-19: Secondary | ICD-10-CM | POA: Diagnosis not present

## 2020-01-16 SURGERY — POSTERIOR LUMBAR FUSION 1 LEVEL
Anesthesia: General

## 2020-01-16 MED ORDER — AMISULPRIDE (ANTIEMETIC) 5 MG/2ML IV SOLN: 10.0000 mg | Freq: Once | INTRAVENOUS | Status: AC

## 2020-01-16 MED ORDER — PHENYLEPHRINE 40 MCG/ML (10ML) SYRINGE FOR IV PUSH (FOR BLOOD PRESSURE SUPPORT)
PREFILLED_SYRINGE | INTRAVENOUS | Status: AC
  Filled 2020-01-16: qty 10

## 2020-01-16 MED ORDER — MIDAZOLAM HCL 2 MG/2ML IJ SOLN
INTRAMUSCULAR | Status: AC
  Filled 2020-01-16: qty 2

## 2020-01-16 MED ORDER — KETAMINE HCL 50 MG/5ML IJ SOSY
PREFILLED_SYRINGE | INTRAMUSCULAR | Status: AC
  Filled 2020-01-16: qty 10

## 2020-01-16 MED ORDER — MAGNESIUM CITRATE PO SOLN: 1.0000 | Freq: Once | ORAL | Status: DC | PRN

## 2020-01-16 MED ORDER — BUPIVACAINE HCL (PF) 0.5 % IJ SOLN
INTRAMUSCULAR | Status: DC | PRN
  Administered 2020-01-16: 10 mL
  Administered 2020-01-16: 20 mL

## 2020-01-16 MED ORDER — MIDAZOLAM HCL 2 MG/2ML IJ SOLN
INTRAMUSCULAR | Status: DC | PRN
  Administered 2020-01-16: 2 mg via INTRAVENOUS

## 2020-01-16 MED ORDER — VANCOMYCIN HCL 1500 MG/300ML IV SOLN
1500.0000 mg | INTRAVENOUS | Status: AC
  Administered 2020-01-16: 1500 mg via INTRAVENOUS
  Filled 2020-01-16: qty 300

## 2020-01-16 MED ORDER — THROMBIN 20000 UNITS EX SOLR
CUTANEOUS | Status: AC
  Filled 2020-01-16: qty 20000

## 2020-01-16 MED ORDER — HYDROXYZINE HCL 50 MG/ML IM SOLN
50.0000 mg | Freq: Four times a day (QID) | INTRAMUSCULAR | Status: DC | PRN
  Administered 2020-01-16: 50 mg via INTRAMUSCULAR
  Filled 2020-01-16: qty 1

## 2020-01-16 MED ORDER — DEXAMETHASONE SODIUM PHOSPHATE 10 MG/ML IJ SOLN
INTRAMUSCULAR | Status: DC | PRN
  Administered 2020-01-16: 10 mg via INTRAVENOUS

## 2020-01-16 MED ORDER — AMISULPRIDE (ANTIEMETIC) 5 MG/2ML IV SOLN
INTRAVENOUS | Status: AC
  Administered 2020-01-16: 10 mg via INTRAVENOUS
  Filled 2020-01-16: qty 2

## 2020-01-16 MED ORDER — POTASSIUM CHLORIDE IN NACL 20-0.9 MEQ/L-% IV SOLN: INTRAVENOUS | Status: DC

## 2020-01-16 MED ORDER — SODIUM CHLORIDE 0.9 % IV SOLN: 250.0000 mL | INTRAVENOUS | Status: DC

## 2020-01-16 MED ORDER — CHLORHEXIDINE GLUCONATE CLOTH 2 % EX PADS: 6.0000 | MEDICATED_PAD | Freq: Once | CUTANEOUS | Status: DC

## 2020-01-16 MED ORDER — OXYCODONE HCL 5 MG PO TABS
5.0000 mg | ORAL_TABLET | ORAL | Status: DC | PRN
  Administered 2020-01-17: 5 mg via ORAL
  Filled 2020-01-16 (×2): qty 1

## 2020-01-16 MED ORDER — SUGAMMADEX SODIUM 200 MG/2ML IV SOLN
INTRAVENOUS | Status: DC | PRN
  Administered 2020-01-16: 200 mg via INTRAVENOUS

## 2020-01-16 MED ORDER — OXYCODONE HCL ER 10 MG PO T12A
10.0000 mg | EXTENDED_RELEASE_TABLET | Freq: Two times a day (BID) | ORAL | Status: DC
  Administered 2020-01-16 – 2020-01-17 (×2): 10 mg via ORAL
  Filled 2020-01-16 (×2): qty 1

## 2020-01-16 MED ORDER — THROMBIN 20000 UNITS EX SOLR
OROMUCOSAL | Status: DC | PRN
  Administered 2020-01-16: 20 mL via TOPICAL

## 2020-01-16 MED ORDER — PHENYLEPHRINE 40 MCG/ML (10ML) SYRINGE FOR IV PUSH (FOR BLOOD PRESSURE SUPPORT)
PREFILLED_SYRINGE | INTRAVENOUS | Status: DC | PRN
  Administered 2020-01-16 (×2): 80 ug via INTRAVENOUS

## 2020-01-16 MED ORDER — MORPHINE SULFATE (PF) 2 MG/ML IV SOLN: 2.0000 mg | INTRAVENOUS | Status: DC | PRN

## 2020-01-16 MED ORDER — ACETAMINOPHEN 500 MG PO TABS
1000.0000 mg | ORAL_TABLET | Freq: Once | ORAL | Status: AC
  Administered 2020-01-16: 1000 mg via ORAL
  Filled 2020-01-16: qty 2

## 2020-01-16 MED ORDER — ACETAMINOPHEN 10 MG/ML IV SOLN
INTRAVENOUS | Status: AC
  Filled 2020-01-16: qty 100

## 2020-01-16 MED ORDER — LIDOCAINE-EPINEPHRINE 0.5 %-1:200000 IJ SOLN
INTRAMUSCULAR | Status: AC
  Filled 2020-01-16: qty 1

## 2020-01-16 MED ORDER — 0.9 % SODIUM CHLORIDE (POUR BTL) OPTIME
TOPICAL | Status: DC | PRN
  Administered 2020-01-16: 1000 mL

## 2020-01-16 MED ORDER — HYDROMORPHONE HCL 1 MG/ML IJ SOLN
INTRAMUSCULAR | Status: AC
  Administered 2020-01-16: 0.5 mg via INTRAVENOUS
  Filled 2020-01-16: qty 1

## 2020-01-16 MED ORDER — DIAZEPAM 5 MG PO TABS
5.0000 mg | ORAL_TABLET | Freq: Four times a day (QID) | ORAL | Status: DC | PRN
  Administered 2020-01-16 – 2020-01-17 (×2): 5 mg via ORAL
  Filled 2020-01-16 (×3): qty 1

## 2020-01-16 MED ORDER — ROSUVASTATIN CALCIUM 5 MG PO TABS
5.0000 mg | ORAL_TABLET | Freq: Every day | ORAL | Status: DC
Start: 2020-01-16 — End: 2020-01-17
  Administered 2020-01-16 – 2020-01-17 (×2): 5 mg via ORAL
  Filled 2020-01-16 (×2): qty 1

## 2020-01-16 MED ORDER — HEMOSTATIC AGENTS (NO CHARGE) OPTIME
TOPICAL | Status: DC | PRN
  Administered 2020-01-16: 1 via TOPICAL

## 2020-01-16 MED ORDER — SODIUM CHLORIDE 0.9% FLUSH
3.0000 mL | Freq: Two times a day (BID) | INTRAVENOUS | Status: DC
  Administered 2020-01-16: 3 mL via INTRAVENOUS

## 2020-01-16 MED ORDER — TAMSULOSIN HCL 0.4 MG PO CAPS
0.4000 mg | ORAL_CAPSULE | Freq: Every day | ORAL | Status: DC
  Administered 2020-01-16: 0.4 mg via ORAL
  Filled 2020-01-16: qty 1

## 2020-01-16 MED ORDER — FENTANYL CITRATE (PF) 250 MCG/5ML IJ SOLN
INTRAMUSCULAR | Status: DC | PRN
  Administered 2020-01-16: 50 ug via INTRAVENOUS
  Administered 2020-01-16: 100 ug via INTRAVENOUS
  Administered 2020-01-16 (×2): 50 ug via INTRAVENOUS

## 2020-01-16 MED ORDER — SODIUM CHLORIDE 0.9% FLUSH: 3.0000 mL | INTRAVENOUS | Status: DC | PRN

## 2020-01-16 MED ORDER — CELECOXIB 200 MG PO CAPS
200.0000 mg | ORAL_CAPSULE | Freq: Two times a day (BID) | ORAL | Status: DC
  Administered 2020-01-16 – 2020-01-17 (×2): 200 mg via ORAL
  Filled 2020-01-16 (×2): qty 1

## 2020-01-16 MED ORDER — LACTATED RINGERS IV SOLN: INTRAVENOUS | Status: DC | PRN

## 2020-01-16 MED ORDER — LISINOPRIL-HYDROCHLOROTHIAZIDE 20-25 MG PO TABS: 1.0000 | ORAL_TABLET | Freq: Every day | ORAL | Status: DC

## 2020-01-16 MED ORDER — ACETAMINOPHEN 325 MG PO TABS
650.0000 mg | ORAL_TABLET | ORAL | Status: DC | PRN
Start: 2020-01-16 — End: 2020-01-17

## 2020-01-16 MED ORDER — ALBUMIN HUMAN 5 % IV SOLN: INTRAVENOUS | Status: DC | PRN

## 2020-01-16 MED ORDER — ONDANSETRON HCL 4 MG/2ML IJ SOLN
INTRAMUSCULAR | Status: AC
  Filled 2020-01-16: qty 2

## 2020-01-16 MED ORDER — DOCUSATE SODIUM 100 MG PO CAPS
100.0000 mg | ORAL_CAPSULE | Freq: Two times a day (BID) | ORAL | Status: DC
  Administered 2020-01-16 – 2020-01-17 (×2): 100 mg via ORAL
  Filled 2020-01-16 (×2): qty 1

## 2020-01-16 MED ORDER — HYDROMORPHONE HCL 1 MG/ML IJ SOLN
0.2500 mg | INTRAMUSCULAR | Status: DC | PRN
  Administered 2020-01-16 (×2): 0.5 mg via INTRAVENOUS

## 2020-01-16 MED ORDER — ONDANSETRON HCL 4 MG/2ML IJ SOLN: 4.0000 mg | Freq: Four times a day (QID) | INTRAMUSCULAR | Status: DC | PRN

## 2020-01-16 MED ORDER — ONDANSETRON HCL 4 MG/2ML IJ SOLN: 4.0000 mg | Freq: Once | INTRAMUSCULAR | Status: DC

## 2020-01-16 MED ORDER — ROCURONIUM BROMIDE 10 MG/ML (PF) SYRINGE
PREFILLED_SYRINGE | INTRAVENOUS | Status: AC
  Filled 2020-01-16: qty 10

## 2020-01-16 MED ORDER — MENTHOL 3 MG MT LOZG: 1.0000 | LOZENGE | OROMUCOSAL | Status: DC | PRN

## 2020-01-16 MED ORDER — CHLORHEXIDINE GLUCONATE 0.12 % MT SOLN
15.0000 mL | Freq: Once | OROMUCOSAL | Status: AC
  Administered 2020-01-16: 15 mL via OROMUCOSAL
  Filled 2020-01-16: qty 15

## 2020-01-16 MED ORDER — ONDANSETRON HCL 4 MG/2ML IJ SOLN
INTRAMUSCULAR | Status: DC | PRN
  Administered 2020-01-16: 4 mg via INTRAVENOUS

## 2020-01-16 MED ORDER — ROCURONIUM BROMIDE 10 MG/ML (PF) SYRINGE
PREFILLED_SYRINGE | INTRAVENOUS | Status: DC | PRN
  Administered 2020-01-16: 80 mg via INTRAVENOUS
  Administered 2020-01-16: 20 mg via INTRAVENOUS

## 2020-01-16 MED ORDER — BISACODYL 5 MG PO TBEC: 5.0000 mg | DELAYED_RELEASE_TABLET | Freq: Every day | ORAL | Status: DC | PRN

## 2020-01-16 MED ORDER — SENNOSIDES-DOCUSATE SODIUM 8.6-50 MG PO TABS: 1.0000 | ORAL_TABLET | Freq: Every evening | ORAL | Status: DC | PRN

## 2020-01-16 MED ORDER — GABAPENTIN 600 MG PO TABS
600.0000 mg | ORAL_TABLET | Freq: Every morning | ORAL | Status: DC
Start: 2020-01-17 — End: 2020-01-17
  Administered 2020-01-17: 600 mg via ORAL
  Filled 2020-01-16: qty 1

## 2020-01-16 MED ORDER — DEXAMETHASONE SODIUM PHOSPHATE 10 MG/ML IJ SOLN
INTRAMUSCULAR | Status: AC
  Filled 2020-01-16: qty 1

## 2020-01-16 MED ORDER — HYDROCHLOROTHIAZIDE 25 MG PO TABS
25.0000 mg | ORAL_TABLET | Freq: Every day | ORAL | Status: DC
  Administered 2020-01-16 – 2020-01-17 (×2): 25 mg via ORAL
  Filled 2020-01-16 (×2): qty 1

## 2020-01-16 MED ORDER — ORAL CARE MOUTH RINSE: 15.0000 mL | Freq: Once | OROMUCOSAL | Status: AC

## 2020-01-16 MED ORDER — OXYCODONE HCL 5 MG PO TABS
10.0000 mg | ORAL_TABLET | ORAL | Status: DC | PRN
  Administered 2020-01-16 – 2020-01-17 (×4): 10 mg via ORAL
  Filled 2020-01-16 (×4): qty 2

## 2020-01-16 MED ORDER — PROPOFOL 10 MG/ML IV BOLUS
INTRAVENOUS | Status: AC
  Filled 2020-01-16: qty 20

## 2020-01-16 MED ORDER — EPHEDRINE SULFATE-NACL 50-0.9 MG/10ML-% IV SOSY
PREFILLED_SYRINGE | INTRAVENOUS | Status: DC | PRN
  Administered 2020-01-16: 10 mg via INTRAVENOUS
  Administered 2020-01-16: 15 mg via INTRAVENOUS
  Administered 2020-01-16: 10 mg via INTRAVENOUS

## 2020-01-16 MED ORDER — PHENYLEPHRINE HCL-NACL 10-0.9 MG/250ML-% IV SOLN
INTRAVENOUS | Status: DC | PRN
  Administered 2020-01-16: 60 ug/min via INTRAVENOUS
  Administered 2020-01-16: 40 ug/min via INTRAVENOUS

## 2020-01-16 MED ORDER — ONDANSETRON HCL 4 MG PO TABS: 4.0000 mg | ORAL_TABLET | Freq: Four times a day (QID) | ORAL | Status: DC | PRN

## 2020-01-16 MED ORDER — PHENOL 1.4 % MT LIQD: 1.0000 | OROMUCOSAL | Status: DC | PRN

## 2020-01-16 MED ORDER — LIDOCAINE 2% (20 MG/ML) 5 ML SYRINGE
INTRAMUSCULAR | Status: DC | PRN
  Administered 2020-01-16: 60 mg via INTRAVENOUS

## 2020-01-16 MED ORDER — PANTOPRAZOLE SODIUM 40 MG PO TBEC
40.0000 mg | DELAYED_RELEASE_TABLET | Freq: Every day | ORAL | Status: DC
Start: 2020-01-16 — End: 2020-01-17
  Administered 2020-01-16 – 2020-01-17 (×2): 40 mg via ORAL
  Filled 2020-01-16 (×2): qty 1

## 2020-01-16 MED ORDER — LISINOPRIL 20 MG PO TABS
20.0000 mg | ORAL_TABLET | Freq: Every day | ORAL | Status: DC
  Administered 2020-01-16 – 2020-01-17 (×2): 20 mg via ORAL
  Filled 2020-01-16 (×2): qty 1

## 2020-01-16 MED ORDER — EPHEDRINE 5 MG/ML INJ
INTRAVENOUS | Status: AC
  Filled 2020-01-16: qty 10

## 2020-01-16 MED ORDER — HYDROMORPHONE HCL 1 MG/ML IJ SOLN
INTRAMUSCULAR | Status: AC
  Filled 2020-01-16: qty 1

## 2020-01-16 MED ORDER — KETAMINE HCL 10 MG/ML IJ SOLN
INTRAMUSCULAR | Status: DC | PRN
  Administered 2020-01-16: 50 mg via INTRAVENOUS

## 2020-01-16 MED ORDER — FENTANYL CITRATE (PF) 250 MCG/5ML IJ SOLN
INTRAMUSCULAR | Status: AC
  Filled 2020-01-16: qty 5

## 2020-01-16 MED ORDER — LIDOCAINE 2% (20 MG/ML) 5 ML SYRINGE
INTRAMUSCULAR | Status: AC
  Filled 2020-01-16: qty 5

## 2020-01-16 MED ORDER — ZOLPIDEM TARTRATE 5 MG PO TABS: 5.0000 mg | ORAL_TABLET | Freq: Every evening | ORAL | Status: DC | PRN

## 2020-01-16 MED ORDER — LIDOCAINE-EPINEPHRINE 0.5 %-1:200000 IJ SOLN
INTRAMUSCULAR | Status: DC | PRN
  Administered 2020-01-16: 10 mL

## 2020-01-16 MED ORDER — PROPOFOL 10 MG/ML IV BOLUS
INTRAVENOUS | Status: DC | PRN
  Administered 2020-01-16: 130 mg via INTRAVENOUS

## 2020-01-16 MED ORDER — THROMBIN (RECOMBINANT) 20000 UNITS EX SOLR
CUTANEOUS | Status: AC
  Filled 2020-01-16: qty 20000

## 2020-01-16 MED ORDER — PSYLLIUM 95 % PO PACK
1.0000 | PACK | Freq: Every day | ORAL | Status: DC
  Administered 2020-01-17: 1 via ORAL
  Filled 2020-01-16: qty 1

## 2020-01-16 MED ORDER — ACETAMINOPHEN 650 MG RE SUPP: 650.0000 mg | RECTAL | Status: DC | PRN

## 2020-01-16 MED ORDER — AMISULPRIDE (ANTIEMETIC) 5 MG/2ML IV SOLN
INTRAVENOUS | Status: AC
  Filled 2020-01-16: qty 2

## 2020-01-16 MED ORDER — BUPIVACAINE HCL (PF) 0.5 % IJ SOLN
INTRAMUSCULAR | Status: AC
  Filled 2020-01-16: qty 30

## 2020-01-16 MED ORDER — LACTATED RINGERS IV SOLN: INTRAVENOUS | Status: DC

## 2020-01-16 MED ORDER — VITAMIN D 25 MCG (1000 UNIT) PO TABS
5000.0000 [IU] | ORAL_TABLET | Freq: Every day | ORAL | Status: DC
  Administered 2020-01-17: 5000 [IU] via ORAL
  Filled 2020-01-16: qty 5

## 2020-01-16 SURGICAL SUPPLY — 69 items
BASKET BONE COLLECTION (BASKET) ×2 IMPLANT
BENZOIN TINCTURE PRP APPL 2/3 (GAUZE/BANDAGES/DRESSINGS) IMPLANT
BLADE BN FN 3.2XSTRL LF (MISCELLANEOUS) ×1 IMPLANT
BLADE BONE MILL FINE (MISCELLANEOUS) ×1
BLADE CLIPPER SURG (BLADE) ×2 IMPLANT
BUR MATCHSTICK NEURO 3.0 LAGG (BURR) ×2 IMPLANT
BUR PRECISION FLUTE 5.0 (BURR) ×2 IMPLANT
CAGE POST IBF 11X4D 26/9 (Cage) ×4 IMPLANT
CANISTER SUCT 3000ML PPV (MISCELLANEOUS) ×2 IMPLANT
CARTRIDGE OIL MAESTRO DRILL (MISCELLANEOUS) ×1 IMPLANT
CNTNR URN SCR LID CUP LEK RST (MISCELLANEOUS) ×2 IMPLANT
CONT SPEC 4OZ STRL OR WHT (MISCELLANEOUS) ×2
COVER BACK TABLE 60X90IN (DRAPES) ×2 IMPLANT
COVER WAND RF STERILE (DRAPES) IMPLANT
DECANTER SPIKE VIAL GLASS SM (MISCELLANEOUS) ×2 IMPLANT
DERMABOND ADVANCED (GAUZE/BANDAGES/DRESSINGS) ×1
DERMABOND ADVANCED .7 DNX12 (GAUZE/BANDAGES/DRESSINGS) ×1 IMPLANT
DIFFUSER DRILL AIR PNEUMATIC (MISCELLANEOUS) ×2 IMPLANT
DRAPE C-ARM 42X72 X-RAY (DRAPES) ×4 IMPLANT
DRAPE C-ARMOR (DRAPES) ×2 IMPLANT
DRAPE LAPAROTOMY 100X72X124 (DRAPES) ×2 IMPLANT
DRAPE SURG 17X23 STRL (DRAPES) ×2 IMPLANT
DRSG OPSITE POSTOP 4X12 (GAUZE/BANDAGES/DRESSINGS) ×2 IMPLANT
DURAPREP 26ML APPLICATOR (WOUND CARE) ×2 IMPLANT
ELECT REM PT RETURN 9FT ADLT (ELECTROSURGICAL) ×2
ELECTRODE REM PT RTRN 9FT ADLT (ELECTROSURGICAL) ×1 IMPLANT
EVACUATOR 1/8 PVC DRAIN (DRAIN) ×2 IMPLANT
GAUZE 4X4 16PLY RFD (DISPOSABLE) IMPLANT
GAUZE SPONGE 4X4 12PLY STRL (GAUZE/BANDAGES/DRESSINGS) IMPLANT
GAUZE SPONGE 4X4 16PLY XRAY LF (GAUZE/BANDAGES/DRESSINGS) ×2 IMPLANT
GLOVE ECLIPSE 6.5 STRL STRAW (GLOVE) ×4 IMPLANT
GLOVE ECLIPSE 7.5 STRL STRAW (GLOVE) ×6 IMPLANT
GLOVE EXAM NITRILE XL STR (GLOVE) IMPLANT
GOWN STRL REUS W/ TWL LRG LVL3 (GOWN DISPOSABLE) ×2 IMPLANT
GOWN STRL REUS W/ TWL XL LVL3 (GOWN DISPOSABLE) IMPLANT
GOWN STRL REUS W/TWL 2XL LVL3 (GOWN DISPOSABLE) ×4 IMPLANT
GOWN STRL REUS W/TWL LRG LVL3 (GOWN DISPOSABLE) ×2
GOWN STRL REUS W/TWL XL LVL3 (GOWN DISPOSABLE)
GRAFT BN 10X1XDBM MAGNIFUSE (Bone Implant) ×1 IMPLANT
GRAFT BONE MAGNIFUSE 1X10CM (Bone Implant) ×1 IMPLANT
KIT BASIN OR (CUSTOM PROCEDURE TRAY) ×2 IMPLANT
KIT POSITION SURG JACKSON T1 (MISCELLANEOUS) ×2 IMPLANT
KIT TURNOVER KIT B (KITS) ×2 IMPLANT
MARKER SKIN DUAL TIP RULER LAB (MISCELLANEOUS) ×2 IMPLANT
MILL MEDIUM DISP (BLADE) IMPLANT
NEEDLE HYPO 25X1 1.5 SAFETY (NEEDLE) ×2 IMPLANT
NEEDLE SPNL 18GX3.5 QUINCKE PK (NEEDLE) IMPLANT
NS IRRIG 1000ML POUR BTL (IV SOLUTION) ×2 IMPLANT
OIL CARTRIDGE MAESTRO DRILL (MISCELLANEOUS) ×2
PACK LAMINECTOMY NEURO (CUSTOM PROCEDURE TRAY) ×2 IMPLANT
PAD ARMBOARD 7.5X6 YLW CONV (MISCELLANEOUS) ×4 IMPLANT
ROD RELINE O-H CON M 5.0/6.0MM (Rod) ×4 IMPLANT
ROD RELINE TI LATERAL MED OFF (Rod) ×6 IMPLANT
ROD TEMPLATE RELINE SL (MISCELLANEOUS) ×2 IMPLANT
SCREW LOCK RELINE 5.5 TULIP (Screw) ×24 IMPLANT
SCREW RELINE-O POLY 5.5X40 (Screw) ×12 IMPLANT
SCREW RELINE-O POLY 6.5X45 (Screw) ×4 IMPLANT
SPONGE LAP 4X18 RFD (DISPOSABLE) IMPLANT
SPONGE SURGIFOAM ABS GEL 100 (HEMOSTASIS) ×2 IMPLANT
STRIP CLOSURE SKIN 1/2X4 (GAUZE/BANDAGES/DRESSINGS) IMPLANT
SUT PROLENE 6 0 BV (SUTURE) IMPLANT
SUT VIC AB 0 CT1 18XCR BRD8 (SUTURE) ×2 IMPLANT
SUT VIC AB 0 CT1 8-18 (SUTURE) ×2
SUT VIC AB 2-0 CT1 18 (SUTURE) ×4 IMPLANT
SUT VIC AB 3-0 SH 8-18 (SUTURE) ×4 IMPLANT
TOWEL GREEN STERILE (TOWEL DISPOSABLE) ×2 IMPLANT
TOWEL GREEN STERILE FF (TOWEL DISPOSABLE) ×2 IMPLANT
TRAY FOLEY MTR SLVR 16FR STAT (SET/KITS/TRAYS/PACK) ×2 IMPLANT
WATER STERILE IRR 1000ML POUR (IV SOLUTION) ×2 IMPLANT

## 2020-01-16 NOTE — Op Note (Signed)
01/16/2020  3:52 PM  PATIENT:  Walter White.  70 y.o. male with an existing arthrodesis from L2-S1. The L1/2 disc space has broken down, is markedly degenerated and listhesed causing severe spinal stenosis at the L1/2 level. The T12/L1 disc space is decrept. 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 POST-OPERATIVE DIAGNOSIS:  spondylolisthesis same PROCEDURE:  Procedure(s): Lumbar 1-2 Posterior lumbar interbody  Arthrodesis Laminectomy L1 beyond the needed exposure for a plif Synthes cages 58mmx 46mm x2, autograft morsels Posterolateral arthrodesis T11-L2 Segmental pedicle screw fixation T10-S1 Nuvasive hardware  SURGEON:  Surgeon(s): Ashok Pall, MD Newman Pies, MD  ASSISTANTS:Jenkins, Dellis Filbert  ANESTHESIA:   general  EBL:  Total I/O In: 2900 [I.V.:2000; Blood:150; IV Piggyback:750] Out: 5643 [Urine:1000; Blood:450]  BLOOD ADMINISTERED:150 CC CELLSAVER  CELL SAVER GIVEN:yes  COUNT:per nursing  DRAINS: (1) Hemovact drain(s) in the subfascial space with  Suction Open   SPECIMEN:  No Specimen  DICTATION: Walter White. is a 70 y.o. male whom was taken to the operating room intubated, and placed under a general anesthetic without difficulty. A foley catheter was placed under sterile conditions. He was positioned prone on a Jackson table with all pressure points properly padded.  His thoraco- lumbar region was prepped and draped in a sterile manner. I infiltrated 20cc's 1/2%lidocaine/1:2000,000 strength epinephrine into the planned incision. I opened the skin with a 10 blade and took the incision down to the thoracolumbar fascia. I exposed the lamina of L1/2, and t10-T12 in a subperiosteal fashion bilaterally. I confirmed my location with an intraoperative xray.  I placed self retaining retractors and started the decompression.  I decompressed the spinal canal at L1/2 via a complete laminectomy  of L1, inferior facetectomies L1 bilaterally, partial facetectomies of L2. I used the drill, and Kerrison punches to remove the lamina and facets. This allowed for decompression of the spinal canal, and the neural foramina of L1/2. The decompression was much beyond the needed bony exposure for a PLIF.  PLIF's were performed at L1/2 in the same fashion. I opened the disc space with a 15 blade then used a variety of instruments to remove the disc and prepare the space for the arthrodesis. I used curettes, rongeurs, punches, shavers for the disc space, and rasps in the discetomy. I measured the disc space and placed 60mm  Titanium  cages(synthes) into the disc space(s). The cages were packed with autograft morsels. Morsels were also placed around the cages in the disc space.  I decorticated the lamina of T11,12, and L2. I placed autograft morsels on the decorticated bone to complete the posterolateral arthrodesis. I placed pedicle screws at T10,11,12, and L1, using fluoroscopic guidance. I drilled a pilot hole, then cannulated the pedicle with a bone probe at each site. I then tapped each pedicle, assessing each site for pedicle violations. No cutouts were appreciated. Screws Harlin Heys) were then placed at each site without difficulty. We attached rods and connectors to the existing hardware spanning L2-S1. The rods were secured with locking caps. The locking caps were secured with torque limited screwdrivers. Final films were performed and the final construct appeared to be in good position.  We closed the wound in a layered fashion. I approximated the thoracolumbar fascia, subcutaneous, and subcuticular planes with vicryl sutures. I used dermabond and an occlusive bandage for a sterile dressing.     PLAN OF CARE: Admit to inpatient   PATIENT DISPOSITION:  as  Delay start of Pharmacological VTE agent (>24hrs) due to surgical blood loss or risk of bleeding:  yes

## 2020-01-16 NOTE — Anesthesia Postprocedure Evaluation (Signed)
Anesthesia Post Note  Patient: Quinnten Calvin.  Procedure(s) Performed: Lumbar 1-2 Posterior lateral and interbody fusion with pedicle screw fixation from Thoracic 10 to Lumbar 2 (N/A )     Patient location during evaluation: PACU Anesthesia Type: General Level of consciousness: awake and alert Pain management: pain level controlled Vital Signs Assessment: post-procedure vital signs reviewed and stable Respiratory status: spontaneous breathing, nonlabored ventilation and respiratory function stable Cardiovascular status: blood pressure returned to baseline and stable Postop Assessment: no apparent nausea or vomiting Anesthetic complications: no   No complications documented.  Last Vitals:  Vitals:   01/16/20 1515 01/16/20 1530  BP: (!) 164/63 117/63  Pulse: 94 88  Resp: 19 19  Temp:    SpO2: 100% 96%    Last Pain:  Vitals:   01/16/20 1530  TempSrc:   PainSc: Asleep                 Fallou Hulbert,W. EDMOND

## 2020-01-16 NOTE — Anesthesia Procedure Notes (Signed)
Procedure Name: Intubation Date/Time: 01/16/2020 9:11 AM Performed by: Reece Agar, CRNA Pre-anesthesia Checklist: Patient identified, Emergency Drugs available, Suction available and Patient being monitored Patient Re-evaluated:Patient Re-evaluated prior to induction Oxygen Delivery Method: Circle System Utilized Preoxygenation: Pre-oxygenation with 100% oxygen Induction Type: IV induction Ventilation: Mask ventilation without difficulty Laryngoscope Size: Mac and 4 Grade View: Grade I Tube type: Oral Tube size: 7.5 mm Number of attempts: 1 Airway Equipment and Method: Stylet Placement Confirmation: ETT inserted through vocal cords under direct vision,  positive ETCO2 and breath sounds checked- equal and bilateral Secured at: 23 cm Tube secured with: Tape Dental Injury: Teeth and Oropharynx as per pre-operative assessment

## 2020-01-16 NOTE — H&P (Signed)
BP (!) 146/76   Pulse 75   Temp 97.9 F (36.6 C) (Oral)   Resp 18   Ht 5\' 7"  (1.702 m)   Wt 107 kg   SpO2 98%   BMI 36.96 kg/m  Walter White comes in today for evaluation of pain that he has in his back.  He has had 4 operations, he says, in the last 4 years.  He has been operated on, on each occasion by Dr. Lynann Bologna.  He is fused now from L2-S1.  He has 2 lateral cages.  He has a posterior cage.  He has an anterior cage at 5-1.  He has iliac screws.  He says at the end of all this, his leg pain, which was prominent, is now gone, but his pain in his back is such when he wears a brace, he says it is about a 6-7; if he does not have his brace on, it about a 9 or 10.  He is only taking hydrocodone twice a day and says that will get him through, but around 3 o'clock in the afternoon, he does have the onset of fairly severe pain.  He has no bowel or bladder issues.  He weighs 240 pounds, temperature is 97.5, blood pressure is 156/87, pulse is 76.     On exam, he is alert, oriented by 4.  He answers all questions appropriately.  He has 5/5 strength in both the upper and lower extremities.  Minimal reflex at the knees and ankles.  Normal reflexes in biceps, triceps, and brachioradialis.  Pupils equal, round, and reactive to light.  Full extraocular movements.  Full visual fields.  Hearing intact to voice.  Symmetric facial movements.  He does have a BMI of 37.68.       On his patient registration, he says the pain first started on August 09, 2013.  He had his surgeries in 2017, 2018, 2019 and 2020; in 2020, he had a knee replacement; ulnar nerves transposition in 2019.  He takes Diclofenac, Lisinopril, Gabapentin, Methocarbamol,  Tizanidine, Hydrocodone, fiber capsules, Juice Plus supplements, Pantoprazole, Rosuvastatin, Tamsulosin.     Shellfish, beef, pork, dairy give him shortness of breath.  Penicillin causes an unknown reaction, but he does believe he is allergic to it.     Review of systems  positive for fatigue, muscle weakness, arm and leg pain, back pain.  He has a history of arthritis, hypertension aneurysms are present in the family history.  He does not feel weakness.  He can stand for approximately 10 minutes.  He does use tobacco.  He does have children.  He is married.  He does not drink alcohol.     Plain films show that he has a solid arthrodesis along with solid arthrodesis from L3-S1.  There was not a study done after his most recent surgery where he underwent a lateral cage at 2-3 and had pedicle screws placed at L2 a connector to the rest of the construct.   Walter White returns today after a myelogram post myelogram CT. It shows significant degenerative breakdown at L1-2 along with a listhesis.  He also has some air in the disc space at T12-L1.  I do believe that this is the reason for his discomfort.  He has more space in the canal, but there is some dye impingement at L1-2.  I explained to them that, yes, I do believe interbody arthrodesis at L1-2 would be beneficial.  His cord ends, really, at T12, so there is  no cord at this area just the quada. I do not think anything needs to be done at T12-L1 in the disc space, but I would take the screws up to T10 or T9 as they are now right up against the thoracolumbar junction and essentially going across it, we should just cross it.  They have asked whether or not this will be the last surgery to his spine, and I told him there is no real way of knowing and that the thoracic spine is far less susceptible to these kind of degenerative changes as it is fairly rigid anyway.  He has solid arthrodesis from every other level from T2 down to S1, so that is not causing a problem in terms of space, but is just the levron that the thoracolumbar junction is up against.  He would like to proceed with an operation.  Again, the degeneration and listhesis at L1-2 is quite severe.

## 2020-01-16 NOTE — Transfer of Care (Signed)
Immediate Anesthesia Transfer of Care Note  Patient: Walter White.  Procedure(s) Performed: Lumbar 1-2 Posterior lateral and interbody fusion with pedicle screw fixation from Thoracic 10 to Lumbar 2 (N/A )  Patient Location: PACU  Anesthesia Type:General  Level of Consciousness: drowsy  Airway & Oxygen Therapy: Patient Spontanous Breathing and Patient connected to face mask oxygen  Post-op Assessment: Report given to RN and Post -op Vital signs reviewed and stable  Post vital signs: Reviewed and stable  Last Vitals:  Vitals Value Taken Time  BP 121/57 01/16/20 1444  Temp 36.3 C 01/16/20 1445  Pulse 93 01/16/20 1449  Resp 19 01/16/20 1449  SpO2 100 % 01/16/20 1449  Vitals shown include unvalidated device data.  Last Pain:  Vitals:   01/16/20 1445  TempSrc:   PainSc: Asleep      Patients Stated Pain Goal: 3 (44/96/75 [id number])  Complications: No complications documented.

## 2020-01-16 NOTE — Progress Notes (Signed)
Orthopedic Tech Progress Note Patient Details:  Walter White 1950/03/14 [id number] Patient has brace  Patient ID: Walter Harvey., male   DOB: 1950/12/07, 70 y.o.   MRN: [id number]   Walter White 01/16/2020, 6:37 PM

## 2020-01-17 MED ORDER — OXYCODONE HCL ER 10 MG PO T12A: 10.0000 mg | EXTENDED_RELEASE_TABLET | Freq: Two times a day (BID) | ORAL | 0 refills | Status: DC

## 2020-01-17 MED ORDER — DOCUSATE SODIUM 100 MG PO CAPS: 100.0000 mg | ORAL_CAPSULE | Freq: Two times a day (BID) | ORAL | 0 refills | Status: DC

## 2020-01-17 MED ORDER — OXYCODONE HCL 10 MG PO TABS: 10.0000 mg | ORAL_TABLET | ORAL | 0 refills | Status: DC | PRN

## 2020-01-17 MED ORDER — DIAZEPAM 5 MG PO TABS: 5.0000 mg | ORAL_TABLET | Freq: Three times a day (TID) | ORAL | 0 refills | Status: DC | PRN

## 2020-01-17 NOTE — Discharge Summary (Signed)
Physician Discharge Summary     Providing Compassionate, Quality Care - Together   Patient ID: Walter White. MRN: [id number] DOB/AGE: Jan 04, 1951 70 y.o.  Admit date: 01/16/2020 Discharge date: 01/17/2020  Admission Diagnoses: Lumbar stenosis with neurogenic claudication  Discharge Diagnoses:  Active Problems:   Fusion of spine   Lumbar stenosis with neurogenic claudication   Discharged Condition: good  Hospital Course: Patient underwent an L1-2 posterior lumbar interbody fusion, with posterior lateral arthrodesis from T11-L2 by Dr. Christella Noa on 01/16/2020. He was admitted to 3C10 following recovery from anesthesia in the PACU. His postoperative course has been uncomplicated. He has worked with both physical and occupational therapies who feel the patient is ready for discharge home. He is ambulating independently and without difficulty. He is tolerating a normal diet. He is not having any bowel or bladder dysfunction. His pain is well-controlled with oral pain medication. He is ready for discharge home.   Consults: Rehabilitation medicine  Significant Diagnostic Studies: DG Lumbar Spine 2-3 Views  Result Date: 01/16/2020 CLINICAL DATA:  Lumbar spondylosis. Additional history provided: L1 and L2 PLIF and screw fixation T10-L2. Provided fluoroscopy time 2 minutes, 29 seconds (98.57 mGy). EXAM: LUMBAR SPINE - 2-3 VIEW; DG C-ARM 1-60 MIN COMPARISON:  Lumbar CT myelogram 11/27/2019. FINDINGS: PA and lateral view intraoperative fluoroscopic images of the thoracolumbar spine are submitted, 2 images total. The images demonstrate new bilateral pedicle screws at the T10, T11, T12 and L1 levels. Vertical interconnecting rods were not present at the time the images were taken. Additionally, an interbody spacer is now present at L1-L2. Overlying retractors. Partially visualized spinal fusion construct more inferiorly within the lumbar spine. IMPRESSION: Two intraoperative fluoroscopic images of the  thoracolumbar spine, as described. Electronically Signed   By: Kellie Simmering DO   On: 01/16/2020 13:08   DG C-Arm 1-60 Min  Result Date: 01/16/2020 CLINICAL DATA:  Lumbar spondylosis. Additional history provided: L1 and L2 PLIF and screw fixation T10-L2. Provided fluoroscopy time 2 minutes, 29 seconds (98.57 mGy). EXAM: LUMBAR SPINE - 2-3 VIEW; DG C-ARM 1-60 MIN COMPARISON:  Lumbar CT myelogram 11/27/2019. FINDINGS: PA and lateral view intraoperative fluoroscopic images of the thoracolumbar spine are submitted, 2 images total. The images demonstrate new bilateral pedicle screws at the T10, T11, T12 and L1 levels. Vertical interconnecting rods were not present at the time the images were taken. Additionally, an interbody spacer is now present at L1-L2. Overlying retractors. Partially visualized spinal fusion construct more inferiorly within the lumbar spine. IMPRESSION: Two intraoperative fluoroscopic images of the thoracolumbar spine, as described. Electronically Signed   By: Kellie Simmering DO   On: 01/16/2020 13:08     Treatments: Surgery:  Lumbar 1-2 Posterior lumbar interbody  Arthrodesis Laminectomy L1 beyond the needed exposure for a plif Synthes cages 31mmx 86mm x2, autograft morsels Posterolateral arthrodesis T11-L2 Segmental pedicle screw fixation T10-S1 Nuvasive hardware  Discharge Exam: Blood pressure (!) 98/56, pulse 75, temperature 98.5 F (36.9 C), temperature source Oral, resp. rate 18, height 5\' 7"  (1.702 m), weight 107 kg, SpO2 94 %.   Alert and oriented x 4 PERRLA CN II-XII grossly intact MAE, Strength and sensation intact Incision is covered with Honeycomb dressing and Steri Strips. Dressing has been reinforced with gauze; Dressing is intact with moderate amount of sanguinous drainage. Hemovac drain in place, charged  Disposition: Discharge disposition: 01-Home or Self Care       Discharge Instructions    Incentive spirometry RT   Complete by: As directed  Allergies as of 01/17/2020      Reactions   Pork-derived Products Shortness Of Breath   Shellfish Allergy Shortness Of Breath   Lovastatin Other (See Comments)   Bone pain    Penicillins    UNSPECIFIED CHILDHOOD REACTION  Has patient had a PCN reaction causing immediate rash, facial/tongue/throat swelling, SOB or lightheadedness with hypotension: Unknown Has patient had a PCN reaction causing severe rash involving mucus membranes or skin necrosis: Unknown Has patient had a PCN reaction that required hospitalization: Unknown Has patient had a PCN reaction occurring within the last 10 years: No If all of the above answers are "NO", then may proceed with Cephalosporin use.      Medication List    STOP taking these medications   celecoxib 200 MG capsule Commonly known as: CELEBREX   HYDROcodone-acetaminophen 5-325 MG tablet Commonly known as: NORCO/VICODIN     TAKE these medications   diazepam 5 MG tablet Commonly known as: VALIUM Take 1 tablet (5 mg total) by mouth every 8 (eight) hours as needed for muscle spasms.   docusate sodium 100 MG capsule Commonly known as: COLACE Take 1 capsule (100 mg total) by mouth 2 (two) times daily.   EQ DAILY FIBER PO Take 5 tablets by mouth daily.   gabapentin 600 MG tablet Commonly known as: NEURONTIN Take 600 mg by mouth in the morning.   lisinopril-hydrochlorothiazide 20-25 MG tablet Commonly known as: ZESTORETIC Take 1 tablet by mouth daily.   oxyCODONE 10 mg 12 hr tablet Commonly known as: OXYCONTIN Take 1 tablet (10 mg total) by mouth every 12 (twelve) hours.   Oxycodone HCl 10 MG Tabs Take 1 tablet (10 mg total) by mouth every 4 (four) hours as needed for severe pain ((score 7 to 10)).   pantoprazole 40 MG tablet Commonly known as: PROTONIX Take 40 mg by mouth daily.   rosuvastatin 5 MG tablet Commonly known as: CRESTOR Take 5 mg by mouth daily.   tamsulosin 0.4 MG Caps capsule Commonly known as: FLOMAX Take 0.4 mg  by mouth at bedtime.   Vitamin D 125 MCG (5000 UT) Caps Take 5,000 Units by mouth daily.       Follow-up Information    Ashok Pall, MD. Call on 01/19/2020.   Specialty: Neurosurgery Why: Call and talk to [name] to arrange a time on 01/19/2020 to get your drain removed. Contact information: [address] [place] [address] [place] [address] [phone]               Signed: Patricia Nettle 01/17/2020, 4:49 PM

## 2020-01-17 NOTE — Progress Notes (Signed)
Patient is discharged from room 3C10 at this time. Alert and in stable condition. IV site d/c'd and instructions read to patient and spouse with understanding verbalized and all questions answered. Left unit via wheelchair with all belongings at side. 

## 2020-01-17 NOTE — Evaluation (Signed)
Occupational Therapy Evaluation Patient Details Name: Walter White. MRN: [id number] DOB: 06/26/1950 Today's Date: 01/17/2020    History of Present Illness This 70 y.o. male admitted for L1-2 PLIF posterolateral arthrodeses T11-L2, ans segmental pedical screw fixation T-10-S1 due to spondylolisthesis.  PMH includes:spinal stenosis, AOA, HTN, AAA, s/p ulnar nerve transposition, s/p Lt TKA, s/p lumbar fusion x 4.   Clinical Impression   Pt admitted with above. He demonstrates the below listed deficits and will benefit from continued OT to maximize safety and independence with BADLs.  Pt recalled 1/3 back precautions, and requires min cues to adhere to them during ADLs.  He has AE and DME at home, and lives with wife, who is supportive.  Will follow while he is hospitalized to ensure safety with ADLs.       Follow Up Recommendations  No OT follow up;Supervision - Intermittent    Equipment Recommendations  None recommended by OT    Recommendations for Other Services       Precautions / Restrictions Precautions Precautions: Back Precaution Booklet Issued: Yes (comment) Precaution Comments: Handout in room.  Reviewed back precautions with him.  He requires mod cues to recall, and min cues to adhere to precautions during activity      Mobility Bed Mobility                    Transfers Overall transfer level: Needs assistance Equipment used: None Transfers: Sit to/from Stand;Stand Pivot Transfers Sit to Stand: Supervision Stand pivot transfers: Supervision            Balance Overall balance assessment: Mild deficits observed, not formally tested                                         ADL either performed or assessed with clinical judgement   ADL Overall ADL's : Needs assistance/impaired Eating/Feeding: Independent   Grooming: Wash/dry hands;Wash/dry face;Oral care;Supervision/safety;Standing Grooming Details (indicate cue type and reason):  reviewed safe technique Upper Body Bathing: Set up;Supervision/ safety;Sitting   Lower Body Bathing: Minimal assistance;Sit to/from stand Lower Body Bathing Details (indicate cue type and reason): unable to perform figure 4.  Discussed use of LH sponge/back brush.  He reports he will acquire Upper Body Dressing : Set up;Supervision/safety;Sitting   Lower Body Dressing: Supervision/safety;Sit to/from stand Lower Body Dressing Details (indicate cue type and reason): Pt has a sock aid at home, but was uncertain of how to use.  He was instructed in it's use, and reviewed how to use reacher to don pants.  Also discussed wearing slip on shoes, and using LH shoe horn to don. He verbalized understanding Toilet Transfer: Supervision/safety;Ambulation;Comfort height toilet Toilet Transfer Details (indicate cue type and reason): recommend installation of grab bar or use of 3in1.  He verbalized understanding Toileting- Water quality scientist and Hygiene: Supervision/safety;Sit to/from stand Toileting - Clothing Manipulation Details (indicate cue type and reason): Discussed safe technique for toileting, and broached use of toileting aid, but pt not interested Tub/ Banker: Walk-in shower   Functional mobility during ADLs: Supervision/safety       Vision         Perception     Praxis      Pertinent Vitals/Pain Pain Assessment: Faces Faces Pain Scale: Hurts a little bit Pain Location: back Pain Descriptors / Indicators: Operative site guarding Pain Intervention(s): Monitored during session     Hand  Dominance Right   Extremity/Trunk Assessment Upper Extremity Assessment Upper Extremity Assessment: Overall WFL for tasks assessed   Lower Extremity Assessment Lower Extremity Assessment: Defer to PT evaluation   Cervical / Trunk Assessment Cervical / Trunk Assessment: Other exceptions Cervical / Trunk Exceptions: s/p lumbar fusion   Communication Communication Communication: No  difficulties   Cognition Arousal/Alertness: Awake/alert Behavior During Therapy: WFL for tasks assessed/performed Overall Cognitive Status: Within Functional Limits for tasks assessed                                 General Comments: Pt verbose and often self distracts   General Comments       Exercises     Shoulder Instructions      Home Living Family/patient expects to be discharged to:: Private residence Living Arrangements: Spouse/significant other Available Help at Discharge: Family;Available PRN/intermittently Type of Home: House Home Access: Stairs to enter [employer] of Steps: 1 Entrance Stairs-Rails: None Home Layout: One level     Bathroom Shower/Tub: Occupational psychologist: Handicapped height     Home Equipment: Environmental consultant - 2 wheels;Bedside commode;Shower seat;Adaptive equipment Adaptive Equipment: Reacher;Sock aid;Long-handled shoe horn        Prior Functioning/Environment Level of Independence: Independent        Comments: pt does endorse balance deficit PTA        OT Problem List: Decreased activity tolerance;Impaired balance (sitting and/or standing);Decreased safety awareness;Decreased knowledge of use of DME or AE;Decreased knowledge of precautions;Obesity;Pain      OT Treatment/Interventions: Self-care/ADL training;DME and/or AE instruction;Patient/family education;Balance training;Therapeutic activities    OT Goals(Current goals can be found in the care plan section) Acute Rehab OT Goals Patient Stated Goal: did not state OT Goal Formulation: With patient Time For Goal Achievement: 01/31/20 Potential to Achieve Goals: Good ADL Goals Pt Will Perform Lower Body Dressing: with supervision;with adaptive equipment;sit to/from stand Pt Will Perform Toileting - Clothing Manipulation and hygiene: with supervision;sit to/from stand;with adaptive equipment Additional ADL Goal #1: Pt will demonstrate indepedence with  back precautions during ADLs.  OT Frequency: Min 2X/week   Barriers to D/C:            Co-evaluation              AM-PAC OT "6 Clicks" Daily Activity     Outcome Measure Help from another person eating meals?: None Help from another person taking care of personal grooming?: A Little Help from another person toileting, which includes using toliet, bedpan, or urinal?: A Little Help from another person bathing (including washing, rinsing, drying)?: A Little Help from another person to put on and taking off regular upper body clothing?: A Little Help from another person to put on and taking off regular lower body clothing?: A Little 6 Click Score: 19   End of Session Nurse Communication: Mobility status  Activity Tolerance: Patient tolerated treatment well Patient left: in chair;with call bell/phone within reach  OT Visit Diagnosis: Pain;Unsteadiness on feet (R26.81) Pain - part of body:  (back)                Time: 4332-9518 OT Time Calculation (min): 31 min Charges:  OT General Charges $OT Visit: 1 Visit OT Evaluation $OT Eval Moderate Complexity: 1 Mod OT Treatments $Self Care/Home Management : 8-22 mins  Nilsa Nutting., OTR/L Acute Rehabilitation Services Pager [phone] Office [place], [place] 01/17/2020, 9:59 AM

## 2020-01-17 NOTE — Discharge Instructions (Addendum)
Wound Care Leave incision open to air. Do not shower until drain is removed. Do not scrub directly on incision.  Do not put any creams, lotions, or ointments on incision. Activity Walk each and every day, increasing distance each day. No lifting greater than 5 lbs.  Avoid bending, arching, and twisting. No driving for 2 weeks; may ride as a passenger locally. If provided with back brace, wear when out of bed.  It is not necessary to wear in bed. Diet Resume your normal diet.  Return to Work Will be discussed at you follow up appointment. Call Your Doctor If Any of These Occur Redness, drainage, or swelling at the wound.  Temperature greater than 101 degrees. Severe pain not relieved by pain medication. Incision starts to come apart. Follow Up Appt Call Monday to get drain removed and to schedule an appointment for 2-4 weeks ([phone]) from now or for problems.  If you have any hardware placed in your spine, you will need an x-ray before your appointment.

## 2020-01-17 NOTE — Progress Notes (Signed)
Providing Compassionate, Quality Care - Together   Subjective: Patient reports his pain is much improved. He would like to go home. Nursing staff report Hemovac drain with 160 mL out overnight and 100 mL out since this 3:30 am.  Objective: Vital signs in last 24 hours: Temp:  [97.3 F (36.3 C)-98.6 F (37 C)] 97.9 F (36.6 C) (01/08 0744) Pulse Rate:  [68-97] 97 (01/08 0744) Resp:  [10-20] 18 (01/08 0744) BP: (103-164)/(55-98) 103/55 (01/08 0744) SpO2:  [93 %-100 %] 100 % (01/08 0744)  Intake/Output from previous day: 01/07 0701 - 01/08 0700 In: 2900 [I.V.:2000; Blood:150; IV Piggyback:750] Out: 2085 [Urine:1350; Drains:285; Blood:450] Intake/Output this shift: Total I/O In: -  Out: 100 [Drains:100]  Alert and oriented x 4 PERRLA CN II-XII grossly intact MAE, Strength and sensation intact Incision is covered with Honeycomb dressing and Steri Strips. Dressing has been reinforced with gauze; Dressing is intact with moderate amount of sanguinous drainage.   Lab Results: No results for input(s): WBC, HGB, HCT, PLT in the last 72 hours. BMET No results for input(s): NA, K, CL, CO2, GLUCOSE, BUN, CREATININE, CALCIUM in the last 72 hours.  Studies/Results: DG Lumbar Spine 2-3 Views  Result Date: 01/16/2020 CLINICAL DATA:  Lumbar spondylosis. Additional history provided: L1 and L2 PLIF and screw fixation T10-L2. Provided fluoroscopy time 2 minutes, 29 seconds (98.57 mGy). EXAM: LUMBAR SPINE - 2-3 VIEW; DG C-ARM 1-60 MIN COMPARISON:  Lumbar CT myelogram 11/27/2019. FINDINGS: PA and lateral view intraoperative fluoroscopic images of the thoracolumbar spine are submitted, 2 images total. The images demonstrate new bilateral pedicle screws at the T10, T11, T12 and L1 levels. Vertical interconnecting rods were not present at the time the images were taken. Additionally, an interbody spacer is now present at L1-L2. Overlying retractors. Partially visualized spinal fusion construct more  inferiorly within the lumbar spine. IMPRESSION: Two intraoperative fluoroscopic images of the thoracolumbar spine, as described. Electronically Signed   By: Kellie Simmering DO   On: 01/16/2020 13:08   DG C-Arm 1-60 Min  Result Date: 01/16/2020 CLINICAL DATA:  Lumbar spondylosis. Additional history provided: L1 and L2 PLIF and screw fixation T10-L2. Provided fluoroscopy time 2 minutes, 29 seconds (98.57 mGy). EXAM: LUMBAR SPINE - 2-3 VIEW; DG C-ARM 1-60 MIN COMPARISON:  Lumbar CT myelogram 11/27/2019. FINDINGS: PA and lateral view intraoperative fluoroscopic images of the thoracolumbar spine are submitted, 2 images total. The images demonstrate new bilateral pedicle screws at the T10, T11, T12 and L1 levels. Vertical interconnecting rods were not present at the time the images were taken. Additionally, an interbody spacer is now present at L1-L2. Overlying retractors. Partially visualized spinal fusion construct more inferiorly within the lumbar spine. IMPRESSION: Two intraoperative fluoroscopic images of the thoracolumbar spine, as described. Electronically Signed   By: Kellie Simmering DO   On: 01/16/2020 13:08    Assessment/Plan: The patient is one day status post L1-2 posterior lumbar interbody fusion, with posterior lateral arthrodesis from T11-L2 by Dr. Christella Noa. The patient's drain is continuing to put out a considerable amount. The drain will be kept in for now and possibly discontinued in the morning. If output slow, patient can possibly go home tomorrow. The patient's wife has brought in his TLSO brace from home.   LOS: 1 day     Viona Gilmore, DNP, AGNP-C Nurse Practitioner  [facility] [place] [address] [place], [place], [place] [address] P: [phone]    F: [phone]  01/17/2020, 10:17 AM

## 2020-01-17 NOTE — Evaluation (Signed)
Physical Therapy Evaluation Patient Details Name: Walter White. MRN: [id number] DOB: [birth year] Today's Date: 01/17/2020   History of Present Illness  This 70 y.o. male admitted for L1-2 PLIF posterolateral arthrodeses T11-L2, ans segmental pedical screw fixation T-10-S1 due to spondylolisthesis.  PMH includes:spinal stenosis, AOA, HTN, AAA, s/p ulnar nerve transposition, s/p Lt TKA, s/p lumbar fusion x 4.    Clinical Impression  Pt admitted with above diagnosis. At the time of PT eval, pt was able to demonstrate transfers and ambulation with gross supervision for safety. Pt was educated on precautions, appropriate activity progression, and car transfer. Pt currently with functional limitations due to the deficits listed below (see PT Problem List). Pt will benefit from skilled PT to increase their independence and safety with mobility to allow discharge to the venue listed below.      Follow Up Recommendations No PT follow up;Supervision for mobility/OOB    Equipment Recommendations  None recommended by PT    Recommendations for Other Services       Precautions / Restrictions Precautions Precautions: Back Precaution Booklet Issued: Yes (comment) Precaution Comments: Handout in room.  Reviewed back precautions with him.  He requires mod cues to recall, and min cues to adhere to precautions during activity Restrictions Weight Bearing Restrictions: No      Mobility  Bed Mobility               General bed mobility comments: Pt was received sitting up in the recliner. Verbally reviewed log roll technique.    Transfers Overall transfer level: Needs assistance Equipment used: None Transfers: Sit to/from [employer] Sit to Stand: Supervision Stand pivot transfers: Supervision       General transfer comment: Impulsive to stand however initially I thought it was due to urgency to urinate and get to the bathroom. Light supervision for power-up to full  standing position. No assist required.  Ambulation/Gait Ambulation/Gait assistance: Supervision Gait Distance (Feet): 400 Feet Assistive device: None Gait Pattern/deviations: Step-through pattern;Decreased stride length;Trunk flexed Gait velocity: Decreased Gait velocity interpretation: 1.31 - 2.62 ft/sec, indicative of limited community ambulator General Gait Details: VC's for improved posture. Grossly no overt LOB noted however occasionally pt appeared mildly unsteady. No assist required to recover however.  Stairs            Wheelchair Mobility    Modified Rankin (Stroke Patients Only)       Balance Overall balance assessment: Mild deficits observed, not formally tested                                           Pertinent Vitals/Pain Pain Assessment: Faces Faces Pain Scale: Hurts a little bit Pain Location: back Pain Descriptors / Indicators: Operative site guarding Pain Intervention(s): Limited activity within patient's tolerance;Monitored during session;Repositioned    Home Living Family/patient expects to be discharged to:: Private residence Living Arrangements: Spouse/significant other Available Help at Discharge: Family;Available PRN/intermittently Type of Home: House Home Access: Stairs to enter Entrance Stairs-Rails: None Entrance Stairs-Number of Steps: 1 Home Layout: One level Home Equipment: Walker - 2 wheels;Bedside commode;Shower seat;Adaptive equipment      Prior Function Level of Independence: Independent         Comments: pt does endorse balance deficit PTA     Hand Dominance   Dominant Hand: Right    Extremity/Trunk Assessment   Upper Extremity Assessment Upper Extremity  Assessment: Defer to OT evaluation    Lower Extremity Assessment Lower Extremity Assessment: Generalized weakness (Consistent with pre-op diagnosis)    Cervical / Trunk Assessment Cervical / Trunk Assessment: Other exceptions Cervical /  Trunk Exceptions: s/p lumbar fusion  Communication   Communication: No difficulties  Cognition Arousal/Alertness: Awake/alert Behavior During Therapy: WFL for tasks assessed/performed Overall Cognitive Status: Within Functional Limits for tasks assessed                                 General Comments: Pt verbose and often self distracts      General Comments      Exercises     Assessment/Plan    PT Assessment Patient needs continued PT services  PT Problem List Decreased strength;Decreased activity tolerance;Decreased balance;Decreased mobility;Decreased knowledge of use of DME;Decreased safety awareness;Decreased knowledge of precautions;Pain       PT Treatment Interventions DME instruction;Gait training;Functional mobility training;Therapeutic activities;Therapeutic exercise;Neuromuscular re-education;Patient/family education    PT Goals (Current goals can be found in the Care Plan section)  Acute Rehab PT Goals Patient Stated Goal: did not state PT Goal Formulation: With patient Time For Goal Achievement: 01/24/20 Potential to Achieve Goals: Good    Frequency Min 5X/week   Barriers to discharge        Co-evaluation               AM-PAC PT "6 Clicks" Mobility  Outcome Measure Help needed turning from your back to your side while in a flat bed without using bedrails?: None Help needed moving from lying on your back to sitting on the side of a flat bed without using bedrails?: A Little Help needed moving to and from a bed to a chair (including a wheelchair)?: A Little Help needed standing up from a chair using your arms (e.g., wheelchair or bedside chair)?: A Little Help needed to walk in hospital room?: A Little Help needed climbing 3-5 steps with a railing? : A Little 6 Click Score: 19    End of Session Equipment Utilized During Treatment: Gait belt Activity Tolerance: Patient tolerated treatment well Patient left: in chair;with call  bell/phone within reach Nurse Communication: Mobility status PT Visit Diagnosis: Unsteadiness on feet (R26.81);Pain Pain - part of body:  (incision site)    Time: 9211-9417 PT Time Calculation (min) (ACUTE ONLY): 20 min   Charges:   PT Evaluation $PT Eval Moderate Complexity: 1 Mod          Rolinda Roan, PT, DPT Acute Rehabilitation Services Pager: [phone] Office: [phone]   Thelma Comp 01/17/2020, 10:23 AM

## 2020-01-19 DIAGNOSIS — M4316 Spondylolisthesis, lumbar region: Secondary | ICD-10-CM | POA: Diagnosis not present

## 2020-01-19 DIAGNOSIS — M5136 Other intervertebral disc degeneration, lumbar region: Secondary | ICD-10-CM | POA: Diagnosis not present

## 2020-01-19 MED FILL — Thrombin (Recombinant) For Soln 20000 Unit: CUTANEOUS | Qty: 1 | Status: AC

## 2020-01-20 MED FILL — Heparin Sodium (Porcine) Inj 1000 Unit/ML: INTRAMUSCULAR | Qty: 30 | Status: AC

## 2020-01-20 MED FILL — Sodium Chloride IV Soln 0.9%: INTRAVENOUS | Qty: 1000 | Status: AC

## 2020-02-19 DIAGNOSIS — M4316 Spondylolisthesis, lumbar region: Secondary | ICD-10-CM | POA: Diagnosis not present

## 2020-02-19 DIAGNOSIS — Z6837 Body mass index (BMI) 37.0-37.9, adult: Secondary | ICD-10-CM | POA: Diagnosis not present

## 2020-02-19 DIAGNOSIS — I10 Essential (primary) hypertension: Secondary | ICD-10-CM | POA: Diagnosis not present

## 2020-02-24 DIAGNOSIS — E8809 Other disorders of plasma-protein metabolism, not elsewhere classified: Secondary | ICD-10-CM | POA: Diagnosis not present

## 2020-02-24 DIAGNOSIS — I10 Essential (primary) hypertension: Secondary | ICD-10-CM | POA: Diagnosis not present

## 2020-02-24 DIAGNOSIS — R7303 Prediabetes: Secondary | ICD-10-CM | POA: Diagnosis not present

## 2020-02-24 DIAGNOSIS — E1122 Type 2 diabetes mellitus with diabetic chronic kidney disease: Secondary | ICD-10-CM | POA: Diagnosis not present

## 2020-02-24 DIAGNOSIS — Z125 Encounter for screening for malignant neoplasm of prostate: Secondary | ICD-10-CM | POA: Diagnosis not present

## 2020-02-24 DIAGNOSIS — E785 Hyperlipidemia, unspecified: Secondary | ICD-10-CM | POA: Diagnosis not present

## 2020-03-03 DIAGNOSIS — J309 Allergic rhinitis, unspecified: Secondary | ICD-10-CM | POA: Diagnosis not present

## 2020-03-03 DIAGNOSIS — Z0001 Encounter for general adult medical examination with abnormal findings: Secondary | ICD-10-CM | POA: Diagnosis not present

## 2020-03-03 DIAGNOSIS — D638 Anemia in other chronic diseases classified elsewhere: Secondary | ICD-10-CM | POA: Diagnosis not present

## 2020-03-03 DIAGNOSIS — K219 Gastro-esophageal reflux disease without esophagitis: Secondary | ICD-10-CM | POA: Diagnosis not present

## 2020-03-03 DIAGNOSIS — N401 Enlarged prostate with lower urinary tract symptoms: Secondary | ICD-10-CM | POA: Diagnosis not present

## 2020-03-03 DIAGNOSIS — E1122 Type 2 diabetes mellitus with diabetic chronic kidney disease: Secondary | ICD-10-CM | POA: Diagnosis not present

## 2020-03-03 DIAGNOSIS — R972 Elevated prostate specific antigen [PSA]: Secondary | ICD-10-CM | POA: Diagnosis not present

## 2020-03-03 DIAGNOSIS — D72819 Decreased white blood cell count, unspecified: Secondary | ICD-10-CM | POA: Diagnosis not present

## 2020-03-03 DIAGNOSIS — D229 Melanocytic nevi, unspecified: Secondary | ICD-10-CM | POA: Diagnosis not present

## 2020-03-03 DIAGNOSIS — I714 Abdominal aortic aneurysm, without rupture: Secondary | ICD-10-CM | POA: Diagnosis not present

## 2020-03-03 DIAGNOSIS — E785 Hyperlipidemia, unspecified: Secondary | ICD-10-CM | POA: Diagnosis not present

## 2020-03-03 DIAGNOSIS — I10 Essential (primary) hypertension: Secondary | ICD-10-CM | POA: Diagnosis not present

## 2020-03-04 DIAGNOSIS — M1711 Unilateral primary osteoarthritis, right knee: Secondary | ICD-10-CM | POA: Diagnosis not present

## 2020-03-08 DIAGNOSIS — L821 Other seborrheic keratosis: Secondary | ICD-10-CM | POA: Diagnosis not present

## 2020-03-08 DIAGNOSIS — D225 Melanocytic nevi of trunk: Secondary | ICD-10-CM | POA: Diagnosis not present

## 2020-03-08 DIAGNOSIS — X32XXXD Exposure to sunlight, subsequent encounter: Secondary | ICD-10-CM | POA: Diagnosis not present

## 2020-03-08 DIAGNOSIS — L570 Actinic keratosis: Secondary | ICD-10-CM | POA: Diagnosis not present

## 2020-03-25 DIAGNOSIS — M1711 Unilateral primary osteoarthritis, right knee: Secondary | ICD-10-CM | POA: Diagnosis not present

## 2020-03-25 DIAGNOSIS — M1812 Unilateral primary osteoarthritis of first carpometacarpal joint, left hand: Secondary | ICD-10-CM | POA: Diagnosis not present

## 2020-04-06 DIAGNOSIS — M1711 Unilateral primary osteoarthritis, right knee: Secondary | ICD-10-CM | POA: Diagnosis not present

## 2020-04-08 DIAGNOSIS — M19042 Primary osteoarthritis, left hand: Secondary | ICD-10-CM | POA: Diagnosis not present

## 2020-04-08 DIAGNOSIS — M19041 Primary osteoarthritis, right hand: Secondary | ICD-10-CM | POA: Diagnosis not present

## 2020-04-12 DIAGNOSIS — M19042 Primary osteoarthritis, left hand: Secondary | ICD-10-CM | POA: Diagnosis not present

## 2020-04-12 DIAGNOSIS — M19041 Primary osteoarthritis, right hand: Secondary | ICD-10-CM | POA: Diagnosis not present

## 2020-04-16 ENCOUNTER — Other Ambulatory Visit: Payer: Self-pay

## 2020-04-16 ENCOUNTER — Inpatient Hospital Stay: Payer: Medicare Other | Attending: Oncology

## 2020-04-16 DIAGNOSIS — Z79899 Other long term (current) drug therapy: Secondary | ICD-10-CM | POA: Diagnosis not present

## 2020-04-16 DIAGNOSIS — D649 Anemia, unspecified: Secondary | ICD-10-CM | POA: Insufficient documentation

## 2020-04-16 DIAGNOSIS — D472 Monoclonal gammopathy: Secondary | ICD-10-CM | POA: Diagnosis not present

## 2020-04-16 LAB — CBC WITH DIFFERENTIAL/PLATELET
Abs Immature Granulocytes: 0.01 10*3/uL (ref 0.00–0.07)
Basophils Absolute: 0 10*3/uL (ref 0.0–0.1)
Basophils Relative: 1 %
Eosinophils Absolute: 0.1 10*3/uL (ref 0.0–0.5)
Eosinophils Relative: 3 %
HCT: 32.2 % — ABNORMAL LOW (ref 39.0–52.0)
Hemoglobin: 10.6 g/dL — ABNORMAL LOW (ref 13.0–17.0)
Immature Granulocytes: 0 %
Lymphocytes Relative: 27 %
Lymphs Abs: 0.9 10*3/uL (ref 0.7–4.0)
MCH: 29.9 pg (ref 26.0–34.0)
MCHC: 32.9 g/dL (ref 30.0–36.0)
MCV: 91 fL (ref 80.0–100.0)
Monocytes Absolute: 0.3 10*3/uL (ref 0.1–1.0)
Monocytes Relative: 9 %
Neutro Abs: 2 10*3/uL (ref 1.7–7.7)
Neutrophils Relative %: 60 %
Platelets: 144 10*3/uL — ABNORMAL LOW (ref 150–400)
RBC: 3.54 MIL/uL — ABNORMAL LOW (ref 4.22–5.81)
RDW: 14.5 % (ref 11.5–15.5)
WBC: 3.4 10*3/uL — ABNORMAL LOW (ref 4.0–10.5)
nRBC: 0 % (ref 0.0–0.2)

## 2020-04-16 LAB — COMPREHENSIVE METABOLIC PANEL
ALT: 23 U/L (ref 0–44)
AST: 23 U/L (ref 15–41)
Albumin: 3.8 g/dL (ref 3.5–5.0)
Alkaline Phosphatase: 89 U/L (ref 38–126)
Anion gap: 9 (ref 5–15)
BUN: 24 mg/dL — ABNORMAL HIGH (ref 8–23)
CO2: 24 mmol/L (ref 22–32)
Calcium: 8.9 mg/dL (ref 8.9–10.3)
Chloride: 104 mmol/L (ref 98–111)
Creatinine, Ser: 1.14 mg/dL (ref 0.61–1.24)
GFR, Estimated: >60 mL/min (ref >60)
Glucose, Bld: 126 mg/dL — ABNORMAL HIGH (ref 70–99)
Potassium: 4 mmol/L (ref 3.5–5.1)
Sodium: 137 mmol/L (ref 135–145)
Total Bilirubin: 0.7 mg/dL (ref 0.3–1.2)
Total Protein: 8.8 g/dL — ABNORMAL HIGH (ref 6.5–8.1)

## 2020-04-16 LAB — PROTEIN / CREATININE RATIO, URINE
Creatinine, Urine: 151.25 mg/dL
Protein Creatinine Ratio: 0.07 mg/mg{Cre} (ref 0.00–0.15)
Total Protein, Urine: 11 mg/dL

## 2020-04-19 LAB — MULTIPLE MYELOMA PANEL, SERUM
Albumin SerPl Elph-Mcnc: 3.6 g/dL (ref 2.9–4.4)
Albumin/Glob SerPl: 0.8 (ref 0.7–1.7)
Alpha 1: 0.3 g/dL (ref 0.0–0.4)
Alpha2 Glob SerPl Elph-Mcnc: 0.8 g/dL (ref 0.4–1.0)
B-Globulin SerPl Elph-Mcnc: 0.9 g/dL (ref 0.7–1.3)
Gamma Glob SerPl Elph-Mcnc: 2.7 g/dL — ABNORMAL HIGH (ref 0.4–1.8)
Globulin, Total: 4.7 g/dL — ABNORMAL HIGH (ref 2.2–3.9)
IgA: 16 mg/dL — ABNORMAL LOW (ref 61–437)
IgG (Immunoglobin G), Serum: 3349 mg/dL — ABNORMAL HIGH (ref 603–1613)
IgM (Immunoglobulin M), Srm: 29 mg/dL (ref 15–143)
M Protein SerPl Elph-Mcnc: 2.5 g/dL — ABNORMAL HIGH
Total Protein ELP: 8.3 g/dL (ref 6.0–8.5)

## 2020-04-19 LAB — KAPPA/LAMBDA LIGHT CHAINS
Kappa free light chain: 54.4 mg/L — ABNORMAL HIGH (ref 3.3–19.4)
Kappa, lambda light chain ratio: 5.79 — ABNORMAL HIGH (ref 0.26–1.65)
Lambda free light chains: 9.4 mg/L (ref 5.7–26.3)

## 2020-04-20 ENCOUNTER — Other Ambulatory Visit: Payer: Self-pay | Admitting: Oncology

## 2020-04-20 ENCOUNTER — Encounter: Payer: Self-pay | Admitting: Oncology

## 2020-04-20 DIAGNOSIS — I10 Essential (primary) hypertension: Secondary | ICD-10-CM | POA: Diagnosis not present

## 2020-04-20 DIAGNOSIS — M4316 Spondylolisthesis, lumbar region: Secondary | ICD-10-CM | POA: Diagnosis not present

## 2020-04-20 DIAGNOSIS — Z6836 Body mass index (BMI) 36.0-36.9, adult: Secondary | ICD-10-CM | POA: Diagnosis not present

## 2020-04-20 DIAGNOSIS — M1711 Unilateral primary osteoarthritis, right knee: Secondary | ICD-10-CM | POA: Diagnosis not present

## 2020-04-21 ENCOUNTER — Telehealth: Payer: Self-pay | Admitting: Oncology

## 2020-04-21 NOTE — Telephone Encounter (Signed)
Scheduled per 04/07 scheduled message, patient has been called and notified.

## 2020-04-22 DIAGNOSIS — R42 Dizziness and giddiness: Secondary | ICD-10-CM | POA: Diagnosis not present

## 2020-04-22 DIAGNOSIS — E785 Hyperlipidemia, unspecified: Secondary | ICD-10-CM | POA: Diagnosis not present

## 2020-04-22 DIAGNOSIS — E1122 Type 2 diabetes mellitus with diabetic chronic kidney disease: Secondary | ICD-10-CM | POA: Diagnosis not present

## 2020-04-22 DIAGNOSIS — E1169 Type 2 diabetes mellitus with other specified complication: Secondary | ICD-10-CM | POA: Diagnosis not present

## 2020-04-22 DIAGNOSIS — I10 Essential (primary) hypertension: Secondary | ICD-10-CM | POA: Diagnosis not present

## 2020-04-29 DIAGNOSIS — N401 Enlarged prostate with lower urinary tract symptoms: Secondary | ICD-10-CM | POA: Insufficient documentation

## 2020-04-29 DIAGNOSIS — R972 Elevated prostate specific antigen [PSA]: Secondary | ICD-10-CM | POA: Insufficient documentation

## 2020-04-29 DIAGNOSIS — Z789 Other specified health status: Secondary | ICD-10-CM | POA: Insufficient documentation

## 2020-04-29 DIAGNOSIS — R3129 Other microscopic hematuria: Secondary | ICD-10-CM | POA: Insufficient documentation

## 2020-04-29 DIAGNOSIS — Z7982 Long term (current) use of aspirin: Secondary | ICD-10-CM | POA: Insufficient documentation

## 2020-04-29 DIAGNOSIS — E1169 Type 2 diabetes mellitus with other specified complication: Secondary | ICD-10-CM | POA: Insufficient documentation

## 2020-04-29 DIAGNOSIS — J309 Allergic rhinitis, unspecified: Secondary | ICD-10-CM | POA: Insufficient documentation

## 2020-04-29 DIAGNOSIS — H8110 Benign paroxysmal vertigo, unspecified ear: Secondary | ICD-10-CM | POA: Diagnosis not present

## 2020-04-29 DIAGNOSIS — K219 Gastro-esophageal reflux disease without esophagitis: Secondary | ICD-10-CM | POA: Insufficient documentation

## 2020-04-29 DIAGNOSIS — Z0001 Encounter for general adult medical examination with abnormal findings: Secondary | ICD-10-CM | POA: Insufficient documentation

## 2020-04-29 DIAGNOSIS — E1121 Type 2 diabetes mellitus with diabetic nephropathy: Secondary | ICD-10-CM | POA: Insufficient documentation

## 2020-04-29 DIAGNOSIS — Q825 Congenital non-neoplastic nevus: Secondary | ICD-10-CM | POA: Insufficient documentation

## 2020-04-29 DIAGNOSIS — R7303 Prediabetes: Secondary | ICD-10-CM | POA: Insufficient documentation

## 2020-04-29 DIAGNOSIS — K439 Ventral hernia without obstruction or gangrene: Secondary | ICD-10-CM | POA: Insufficient documentation

## 2020-04-29 DIAGNOSIS — I10 Essential (primary) hypertension: Secondary | ICD-10-CM | POA: Insufficient documentation

## 2020-04-29 DIAGNOSIS — E785 Hyperlipidemia, unspecified: Secondary | ICD-10-CM | POA: Insufficient documentation

## 2020-04-29 DIAGNOSIS — I714 Abdominal aortic aneurysm, without rupture, unspecified: Secondary | ICD-10-CM | POA: Insufficient documentation

## 2020-04-29 DIAGNOSIS — R1010 Upper abdominal pain, unspecified: Secondary | ICD-10-CM | POA: Insufficient documentation

## 2020-04-29 DIAGNOSIS — D72819 Decreased white blood cell count, unspecified: Secondary | ICD-10-CM | POA: Insufficient documentation

## 2020-04-29 DIAGNOSIS — R292 Abnormal reflex: Secondary | ICD-10-CM | POA: Insufficient documentation

## 2020-04-30 DIAGNOSIS — M19042 Primary osteoarthritis, left hand: Secondary | ICD-10-CM | POA: Diagnosis not present

## 2020-04-30 DIAGNOSIS — M19041 Primary osteoarthritis, right hand: Secondary | ICD-10-CM | POA: Diagnosis not present

## 2020-05-04 DIAGNOSIS — Z9181 History of falling: Secondary | ICD-10-CM | POA: Diagnosis not present

## 2020-05-04 DIAGNOSIS — M4316 Spondylolisthesis, lumbar region: Secondary | ICD-10-CM | POA: Diagnosis not present

## 2020-05-05 ENCOUNTER — Ambulatory Visit (INDEPENDENT_AMBULATORY_CARE_PROVIDER_SITE_OTHER): Payer: Medicare Other | Admitting: Ophthalmology

## 2020-05-05 ENCOUNTER — Encounter (INDEPENDENT_AMBULATORY_CARE_PROVIDER_SITE_OTHER): Payer: Self-pay | Admitting: Ophthalmology

## 2020-05-05 ENCOUNTER — Other Ambulatory Visit: Payer: Self-pay

## 2020-05-05 DIAGNOSIS — H33312 Horseshoe tear of retina without detachment, left eye: Secondary | ICD-10-CM | POA: Diagnosis not present

## 2020-05-05 DIAGNOSIS — H3581 Retinal edema: Secondary | ICD-10-CM

## 2020-05-05 DIAGNOSIS — Z961 Presence of intraocular lens: Secondary | ICD-10-CM

## 2020-05-05 DIAGNOSIS — H35033 Hypertensive retinopathy, bilateral: Secondary | ICD-10-CM | POA: Diagnosis not present

## 2020-05-05 DIAGNOSIS — I10 Essential (primary) hypertension: Secondary | ICD-10-CM | POA: Diagnosis not present

## 2020-05-05 DIAGNOSIS — H8110 Benign paroxysmal vertigo, unspecified ear: Secondary | ICD-10-CM | POA: Diagnosis not present

## 2020-05-05 DIAGNOSIS — H26492 Other secondary cataract, left eye: Secondary | ICD-10-CM | POA: Diagnosis not present

## 2020-05-05 DIAGNOSIS — M19049 Primary osteoarthritis, unspecified hand: Secondary | ICD-10-CM | POA: Diagnosis not present

## 2020-05-05 DIAGNOSIS — H43812 Vitreous degeneration, left eye: Secondary | ICD-10-CM | POA: Diagnosis not present

## 2020-05-05 MED ORDER — PREDNISOLONE ACETATE 1 % OP SUSP: 1.0000 [drp] | Freq: Four times a day (QID) | OPHTHALMIC | 0 refills | Status: AC

## 2020-05-05 NOTE — Progress Notes (Signed)
[facility] Note  05/05/2020     CHIEF COMPLAINT Patient presents for Retina Evaluation (Pt here for retinal eval referred from Dr. [name] for HST OD. Pt states on Monday that he noticed spot in vision OD that then progressed to a dark veil Tuesday morning. No other ocular symptoms, no discomfort. )   HISTORY OF PRESENT ILLNESS: Walter White. is a 70 y.o. male who presents to the clinic today for:   HPI    Retina Evaluation    In right eye.  This started 3 days ago.  Duration of 3 days.  I, the attending physician,  performed the HPI with the patient and updated documentation appropriately. Additional comments: Pt here for retinal eval referred from Dr. [name] for HST OD. Pt states on Monday that he noticed spot in vision OD that then progressed to a dark veil Tuesday morning. No other ocular symptoms, no discomfort.        Last edited by [name], MD on 05/05/2020  9:53 PM. (History)    pt here on the referral of Dr. [name] for concern of HST OS, he states on Monday, he was mowing grass and thought he got something in his eye and scratched it, he states yesterday when he got home Monday afternoon, he had a small dot in his vision, but by the time he woke up, his vision was more "cloudy", pt has had cataract sx with Dr. Katy Fitch  Referring physician: [name], MD [address] [place],  [place] [address]  HISTORICAL INFORMATION:   Selected notes from the MEDICAL RECORD NUMBER Referred by Dr. [name] LEE:  Ocular Hx- PMH-    CURRENT MEDICATIONS: Current Outpatient Medications (Ophthalmic Drugs)  Medication Sig  . prednisoLONE acetate (PRED FORTE) 1 % ophthalmic suspension Place 1 drop into the left eye 4 (four) times daily for 7 days.   No current facility-administered medications for this visit. (Ophthalmic Drugs)   Current Outpatient Medications (Other)  Medication Sig  . Cholecalciferol (VITAMIN D) 125  MCG (5000 UT) CAPS Take 5,000 Units by mouth daily.  . diazepam (VALIUM) 5 MG tablet Take 1 tablet (5 mg total) by mouth every 8 (eight) hours as needed for muscle spasms.  [name] docusate sodium (COLACE) 100 MG capsule Take 1 capsule (100 mg total) by mouth 2 (two) times daily.  [name] gabapentin (NEURONTIN) 600 MG tablet Take 600 mg by mouth in the morning.   [name] lisinopril-hydrochlorothiazide (PRINZIDE,ZESTORETIC) 20-25 MG tablet Take 1 tablet by mouth daily.  [name] oxyCODONE (OXYCONTIN) 10 mg 12 hr tablet Take 1 tablet (10 mg total) by mouth every 12 (twelve) hours.  [name] oxyCODONE 10 MG TABS Take 1 tablet (10 mg total) by mouth every 4 (four) hours as needed for severe pain ((score 7 to 10)).  [name] pantoprazole (PROTONIX) 40 MG tablet Take 40 mg by mouth daily.  . Psyllium (EQ DAILY FIBER PO) Take 5 tablets by mouth daily.  . rosuvastatin (CRESTOR) 5 MG tablet Take 5 mg by mouth daily.  . Tamsulosin HCl (FLOMAX) 0.4 MG CAPS Take 0.4 mg by mouth at bedtime.    No current facility-administered medications for this visit. (Other)      REVIEW OF SYSTEMS: ROS    Positive for: Cardiovascular, Eyes   Last edited by Kingsley Spittle, COT on 05/05/2020  2:36 PM. (History)       ALLERGIES Allergies  Allergen Reactions  .  Pork-Derived Products Shortness Of Breath  . Shellfish Allergy Shortness Of Breath  . Lovastatin Other (See Comments)    Bone pain   . Penicillins     UNSPECIFIED CHILDHOOD REACTION  Has patient had a PCN reaction causing immediate rash, facial/tongue/throat swelling, SOB or lightheadedness with hypotension: Unknown Has patient had a PCN reaction causing severe rash involving mucus membranes or skin necrosis: Unknown Has patient had a PCN reaction that required hospitalization: Unknown Has patient had a PCN reaction occurring within the last 10 years: No If all of the above answers are "NO", then may proceed with Cephalosporin use.     PAST MEDICAL HISTORY Past Medical History:   Diagnosis Date  . AAA (abdominal aortic aneurysm) (HCC)    4.0 cm AAA 08/22/17 U/S  . Allergy    Allergic Rhinitis  . Anemia   . Arthritis    spinal steniosis  . Benign prostate hyperplasia    with lower urinary tract symptoms  . Carpal tunnel syndrome on right   . Cataracts, both eyes   . Chronic back pain    caused by spinal stenosis  . Elevated PSA   . GERD (gastroesophageal reflux disease)   . Hyperlipidemia   . Hypertension    Essential.    . MGUS (monoclonal gammopathy of unknown significance)   . Microhematuria   . OA (osteoarthritis) of knee   . Pneumonia    Hx -when very young  . Pre-diabetes   . Seasonal allergies   . Spinal stenosis    of lumbosacral region.  Causes chronic back pain.  [name] Upper abdominal pain    Past Surgical History:  Procedure Laterality Date  . ABDOMINAL EXPOSURE N/A 09/05/2017   Procedure: ABDOMINAL EXPOSURE;  Surgeon: Rosetta Posner, MD;  Location: [facility] OR;  Service: Vascular;  Laterality: N/A;  . ANTERIOR LAT LUMBAR FUSION Left 01/26/2016   Procedure: LEFT SIDED LATERAL INTERBODY FUSION, LUMBAR 3-4 WITH INSTRUMENTATION AND ALLOGRAFT;  Surgeon: Phylliss Bob, MD;  Location: [place];  Service: Orthopedics;  Laterality: Left;  LEFT SIDED LATERAL INTERBODY FUSION, LUMBAR 3-4 WITH INSTRUMENTATION AND ALLOGRAFT  . ANTERIOR LAT LUMBAR FUSION Right 08/14/2018   Procedure: RIGHT LUMBAR two to LUMBAR three LATERAL INTERBODY FUSION WITH INSTRUMENTATION AND ALLOGRAFT;  Surgeon: Phylliss Bob, MD;  Location: [place];  Service: Orthopedics;  Laterality: Right;  . ANTERIOR LATERAL LUMBAR FUSION WITH PERCUTANEOUS SCREW 1 LEVEL N/A 09/05/2017   Procedure: LUMBAR 5 - SACRUM 1 ANTERIOR LUMBAR INTERBODY FUSION WITH INSTRUMENTATION AND ALLOGRAFT;  Surgeon: Phylliss Bob, MD;  Location: [facility];  Service: Orthopedics;  Laterality: N/A;  . BACK SURGERY  01/2015   dr. Lynann Bologna. [phone].  [name] CARPAL TUNNEL RELEASE     right   . CATARACT EXTRACTION, BILATERAL    . COLONOSCOPY     . COLONOSCOPY  [id number]   polyp  . ELBOW SURGERY     right ulnar release  . ELBOW SURGERY     LEFT CTR  . EYE SURGERY    . JOINT REPLACEMENT     left- Sept 2016  . KNEE ARTHROSCOPY  11/23/2010   Procedure: ARTHROSCOPY KNEE;  Surgeon: Kerin Salen;  Location: [facility];  Service: Orthopedics;  Laterality: Left;  arthroscopy left knee  . KNEE ARTHROSCOPY     left x 2  . Knee scar tissue removal Left 2018  . LATERAL FUSION LUMBAR SPINE  01./17/2018  . polypectomy  2010  . SHOULDER ARTHROSCOPY Left    x2  .  TONSILLECTOMY    . TOTAL KNEE ARTHROPLASTY Left 09/21/2014   Procedure: TOTAL KNEE ARTHROPLASTY;  Surgeon: Frederik Pear, MD;  Location: [facility];  Service: Orthopedics;  Laterality: Left;  . ULNAR NERVE TRANSPOSITION Left 04/16/2017   Procedure: LEFT ULNAR NEUROPLASTY AT THE WRIST AND TRANSPOSITION AT THE ELBOW;  Surgeon: Milly Jakob, MD;  Location: [facility];  Service: Orthopedics;  Laterality: Left;  . WISDOM TOOTH EXTRACTION      FAMILY HISTORY Family History  Problem Relation Age of Onset  . Cancer Mother   . Liver disease Father   . Colon cancer Maternal Uncle 35  . Colon polyps Neg Hx   . Esophageal cancer Neg Hx   . Rectal cancer Neg Hx   . Stomach cancer Neg Hx     SOCIAL HISTORY Social History   Tobacco Use  . Smoking status: Former Smoker    Quit date: 11/21/1980    Years since quitting: 39.4  . Smokeless tobacco: Never Used  Vaping Use  . Vaping Use: Never used  Substance Use Topics  . Alcohol use: Yes    Alcohol/week: 0.0 standard drinks    Comment: Very rare  . Drug use: No         OPHTHALMIC EXAM:  Base Eye Exam    Visual Acuity (Snellen - Linear)      Right Left   Dist [place] 20/30 +2 20/30 +2   Dist ph [place] 20/25 NI       Tonometry (Tonopen, 2:44 PM)      Right Left   Pressure 11 13       Pupils      Dark Light Shape React APD   Right 4 4 Round Minimal None   Left 4 4 Round Minimal None  Pt has  already been dilated by Dr. Zenia Resides office. MS       Visual Fields (Counting fingers)      Left Right    Full Full       Extraocular Movement      Right Left    Full, Ortho Full, Ortho       Neuro/Psych    Oriented x3: Yes   Mood/Affect: Normal        Slit Lamp and Fundus Exam    Slit Lamp Exam      Right Left   Lids/Lashes Dermatochalasis - upper lid Dermatochalasis - upper lid   Conjunctiva/Sclera White and quiet White and quiet   Cornea arcus, well healed cataract wound arcus, well healed cataract wound   Anterior Chamber Deep and quiet Deep and quiet   Iris Round and moderately dilated Round and moderately dilated   Lens PC IOL in good position PC IOL in good position   Vitreous Vitreous syneresis Vitreous syneresis, Posterior vitreous detachment, blood stained vitreous condensations       Fundus Exam      Right Left   Disc Pink and Sharp Partially obscured, mild Pallor, Sharp rim   C/D Ratio 0.2 0.2   Macula Flat, Blunted foveal reflex, RPE mottling, No heme or edema Partially obscured, grossly flat, No heme or edema   Vessels mild attenuation, Copper wiring, mild tortuousity mild attenuation   Periphery Attached    Attached, horseshoe tear at 0230 with focal punctate heme surrounding, mild pre-retinal heme settled inferiorly             IMAGING AND PROCEDURES  Imaging and Procedures for 05/05/2020  OCT, Retina - OU - Both  Eyes       Right Eye Quality was good. Central Foveal Thickness: 285. Progression has no prior data. Findings include normal foveal contour, no IRF, no SRF (Partial PVD).   Left Eye Quality was good. Central Foveal Thickness: 282. Progression has no prior data. Findings include normal foveal contour, no IRF, no SRF (+vitreous opacities).   Notes *Images captured and stored on drive  Diagnosis / Impression:  NFP; no IRF/SRF OU +vitreous opacities OS   Clinical management:  See below  Abbreviations: NFP - Normal foveal profile.  CME - cystoid macular edema. PED - pigment epithelial detachment. IRF - intraretinal fluid. SRF - subretinal fluid. EZ - ellipsoid zone. ERM - epiretinal membrane. ORA - outer retinal atrophy. ORT - outer retinal tubulation. SRHM - subretinal hyper-reflective material. IRHM - intraretinal hyper-reflective material        Repair Retinal Breaks, Laser - OS - Left Eye       LASER PROCEDURE NOTE  Procedure:  Barrier laser retinopexy using laser indirect ophthalmoscope, LEFT eye   Diagnosis:   Retinal tear, LEFT eye                     Flap tear at 0230 o'clock anterior to equator   Surgeon: [name], MD, PhD  Anesthesia: Topical  Informed consent obtained, operative eye marked, and time out performed prior to initiation of laser.   Laser settings:  Lumenis [id number] laser indirect ophthalmoscope Power: 280 mW Duration: 70 msec  # spots: 474  Placement of laser: Laser was placed in three confluent rows around the posterior aspect of the flap tear at 230 oclock. Media opacities prevented good laser uptake  Around the anterior aspect of the tear and the decision was made to complete the repair via cryopexy -- see other procedure note.  Complications: None.  Patient tolerated the procedure well and received written and verbal post-procedure care information/education.        Repair Retinal Breaks, Cryotherapy - OS - Left Eye       CRYOPEXY PROCEDURE NOTE  Preoperative Diagnosis:       Retinal Tear, LEFT EYE Postoperative Diagnosis:     Same  Surgeon:              [name], M.D./Ph.D. Assistant:   Monica Becton, C.O.T. Anesthesia Type:            Subconjunctival lidocaine injection   The patient's LEFT eye was marked and a timeout was performed to verify the correct patient, the correct site, and correct procedure. One of drop of Proparacaine was given in the operative eye. A local subconjunctival block was performed utilizing 2% Lidocaine using a 30 gauge needle from the  1-5 clock hours. An eyelid speculum was inserted into the operative eye. Using indirect ophthalmoloscopy, the retinal tear was identified. Cryotherapy was appropriately utilized in the areas of the retinal tear at the 0230 oclock hour.  A small amount of Maxitrol ointment was instilled to the operative eye.  The patient was given a prescription for Pred-Forte gtts to be used QID in the operative eye for 7 days.                 ASSESSMENT/PLAN:    ICD-10-CM   1. Retinal tear of left eye  H33.312 Repair Retinal Breaks, Laser - OS - Left Eye    Repair Retinal Breaks, Cryotherapy - OS - Left Eye  2. Retinal edema  H35.81 OCT, Retina - OU -  Both Eyes  3. Essential hypertension  I10   4. Hypertensive retinopathy of both eyes  H35.033   5. Pseudophakia, both eyes  Z96.1     1,2. Retinal tear, OS  - Acute PVD on Monday, 4.25.22, w/ symptomatic floaters  - was seen by [facility] today -- HST noted OS - The incidence, risk factors, and natural history of retinal tear was discussed with patient.   - Potential treatment options including laser retinopexy and cryotherapy discussed with patient. - horseshoe tear located at 0230 with punctate heme surrounding - recommend laser retinopexy OS today - RBA of procedure discussed, questions answered - informed consent obtained and signed - see procedure note -- unable to complete anterior portion of pexy and converted to cryopexy  - RBA of procedure discussed, questions answered  - informed consent obtained and signed for cryopexy - see procedure note  - start PF QID x7 days - f/u in 2 wks, DFE, OCT  3,4. Hypertensive retinopathy OU - discussed importance of tight BP control - monitor  5. Pseudophakia OU  - s/p CE/IOL  - IOL in good position, doing well  - monitor   Ophthalmic Meds Ordered this visit:  Meds ordered this encounter  Medications  . prednisoLONE acetate (PRED FORTE) 1 % ophthalmic suspension    Sig: Place 1 drop  into the left eye 4 (four) times daily for 7 days.    Dispense:  10 mL    Refill:  0      Return in about 2 weeks (around 05/19/2020) for f/u retinal tear OS, DFE, OCT.  There are no Patient Instructions on file for this visit.   Explained the diagnoses, plan, and follow up with the patient and they expressed understanding.  Patient expressed understanding of the importance of proper follow up care.   This document serves as a record of services personally performed by Gardiner Sleeper, MD, PhD. It was created on their behalf by San Jetty. Owens Shark, OA an ophthalmic technician. The creation of this record is the provider's dictation and/or activities during the visit.    Electronically signed by: San Jetty. Owens Shark, [place] 04.27.2022 10:09 PM   Gardiner Sleeper, M.D., Ph.D. Diseases & Surgery of the Retina and Vitreous [facility]  I have reviewed the above documentation for accuracy and completeness, and I agree with the above. Gardiner Sleeper, M.D., Ph.D. 05/05/20 10:09 PM  Abbreviations: M myopia (nearsighted); A astigmatism; H hyperopia (farsighted); P presbyopia; Mrx spectacle prescription;  CTL contact lenses; OD right eye; OS left eye; OU both eyes  XT exotropia; ET esotropia; PEK punctate epithelial keratitis; PEE punctate epithelial erosions; DES dry eye syndrome; MGD meibomian gland dysfunction; ATs artificial tears; PFAT's preservative free artificial tears; [place] nuclear sclerotic cataract; PSC posterior subcapsular cataract; ERM epi-retinal membrane; PVD posterior vitreous detachment; RD retinal detachment; DM diabetes mellitus; DR diabetic retinopathy; NPDR non-proliferative diabetic retinopathy; PDR proliferative diabetic retinopathy; CSME clinically significant macular edema; DME diabetic macular edema; dbh dot blot hemorrhages; CWS cotton wool spot; POAG primary open angle glaucoma; C/D cup-to-disc ratio; HVF humphrey visual field; GVF goldmann visual field; OCT  optical coherence tomography; IOP intraocular pressure; BRVO Branch retinal vein occlusion; CRVO central retinal vein occlusion; CRAO central retinal artery occlusion; BRAO branch retinal artery occlusion; RT retinal tear; SB scleral buckle; PPV pars plana vitrectomy; VH Vitreous hemorrhage; PRP panretinal laser photocoagulation; IVK intravitreal kenalog; VMT vitreomacular traction; MH Macular hole;  NVD neovascularization of the disc;  NVE neovascularization elsewhere; AREDS age related eye disease study; ARMD age related macular degeneration; POAG primary open angle glaucoma; EBMD epithelial/anterior basement membrane dystrophy; ACIOL anterior chamber intraocular lens; IOL intraocular lens; PCIOL posterior chamber intraocular lens; Phaco/IOL phacoemulsification with intraocular lens placement; [place] photorefractive keratectomy; LASIK laser assisted in situ keratomileusis; HTN hypertension; DM diabetes mellitus; COPD chronic obstructive pulmonary disease

## 2020-05-06 DIAGNOSIS — Z9181 History of falling: Secondary | ICD-10-CM | POA: Diagnosis not present

## 2020-05-06 DIAGNOSIS — M4316 Spondylolisthesis, lumbar region: Secondary | ICD-10-CM | POA: Diagnosis not present

## 2020-05-07 ENCOUNTER — Ambulatory Visit (HOSPITAL_COMMUNITY)
Admission: RE | Admit: 2020-05-07 | Discharge: 2020-05-07 | Disposition: A | Discharge status: Home / Self Care | Payer: Medicare Other | Source: Ambulatory Visit | Attending: Vascular Surgery | Admitting: Vascular Surgery

## 2020-05-07 ENCOUNTER — Ambulatory Visit (INDEPENDENT_AMBULATORY_CARE_PROVIDER_SITE_OTHER): Payer: Medicare Other | Admitting: Physician Assistant

## 2020-05-07 ENCOUNTER — Other Ambulatory Visit: Payer: Self-pay

## 2020-05-07 VITALS — BP 144/84 | HR 67 | Temp 98.6°F | Resp 20 | Ht 67.0 in | Wt 229.7 lb

## 2020-05-07 DIAGNOSIS — I714 Abdominal aortic aneurysm, without rupture, unspecified: Secondary | ICD-10-CM

## 2020-05-07 NOTE — Progress Notes (Signed)
Office Note     CC:  follow up Requesting Provider:  Deland Pretty, MD  HPI: Walter White. is a 70 y.o. (18-Jan-1950) male who presents for annual follow-up of abdominal aortic aneurysm.  At his last visit his aneurysm was unchanged in size.  He has chronic back pain but no episodes of significant sudden back or abdominal pain.  He remains very active without complaints of claudication or rest pain.  He recently underwent left eye surgery for detached retina.  He is compliant with his statin daily.  He quit smoking 40 years ago.  He is not diabetic.   Past Medical History:  Diagnosis Date  . AAA (abdominal aortic aneurysm) (HCC)    4.0 cm AAA 08/22/17 U/S  . Allergy    Allergic Rhinitis  . Anemia   . Arthritis    spinal steniosis  . Benign prostate hyperplasia    with lower urinary tract symptoms  . Carpal tunnel syndrome on right   . Cataracts, both eyes   . Chronic back pain    caused by spinal stenosis  . Elevated PSA   . GERD (gastroesophageal reflux disease)   . Hyperlipidemia   . Hypertension    Essential.    . MGUS (monoclonal gammopathy of unknown significance)   . Microhematuria   . OA (osteoarthritis) of knee   . Pneumonia    Hx -when very young  . Pre-diabetes   . Seasonal allergies   . Spinal stenosis    of lumbosacral region.  Causes chronic back pain.  [name] Upper abdominal pain     Past Surgical History:  Procedure Laterality Date  . ABDOMINAL EXPOSURE N/A 09/05/2017   Procedure: ABDOMINAL EXPOSURE;  Surgeon: Rosetta Posner, MD;  Location: [facility] OR;  Service: Vascular;  Laterality: N/A;  . ANTERIOR LAT LUMBAR FUSION Left 01/26/2016   Procedure: LEFT SIDED LATERAL INTERBODY FUSION, LUMBAR 3-4 WITH INSTRUMENTATION AND ALLOGRAFT;  Surgeon: Phylliss Bob, MD;  Location: [place];  Service: Orthopedics;  Laterality: Left;  LEFT SIDED LATERAL INTERBODY FUSION, LUMBAR 3-4 WITH INSTRUMENTATION AND ALLOGRAFT  . ANTERIOR LAT LUMBAR FUSION Right 08/14/2018   Procedure:  RIGHT LUMBAR two to LUMBAR three LATERAL INTERBODY FUSION WITH INSTRUMENTATION AND ALLOGRAFT;  Surgeon: Phylliss Bob, MD;  Location: [place];  Service: Orthopedics;  Laterality: Right;  . ANTERIOR LATERAL LUMBAR FUSION WITH PERCUTANEOUS SCREW 1 LEVEL N/A 09/05/2017   Procedure: LUMBAR 5 - SACRUM 1 ANTERIOR LUMBAR INTERBODY FUSION WITH INSTRUMENTATION AND ALLOGRAFT;  Surgeon: Phylliss Bob, MD;  Location: [place];  Service: Orthopedics;  Laterality: N/A;  . BACK SURGERY  01/2015   dr. Lynann Bologna. [id number].  [name] CARPAL TUNNEL RELEASE     right   . CATARACT EXTRACTION, BILATERAL    . COLONOSCOPY    . COLONOSCOPY  [id number]   polyp  . ELBOW SURGERY     right ulnar release  . ELBOW SURGERY     LEFT CTR  . EYE SURGERY    . JOINT REPLACEMENT     left- Sept 2016  . KNEE ARTHROSCOPY  11/23/2010   Procedure: ARTHROSCOPY KNEE;  Surgeon: Kerin Salen;  Location: [place];  Service: Orthopedics;  Laterality: Left;  arthroscopy left knee  . KNEE ARTHROSCOPY     left x 2  . Knee scar tissue removal Left 2018  . LATERAL FUSION LUMBAR SPINE  01./17/2018  . polypectomy  2010  . SHOULDER ARTHROSCOPY Left    x2  . TONSILLECTOMY    .  TOTAL KNEE ARTHROPLASTY Left 09/21/2014   Procedure: TOTAL KNEE ARTHROPLASTY;  Surgeon: Frederik Pear, MD;  Location: [place];  Service: Orthopedics;  Laterality: Left;  . ULNAR NERVE TRANSPOSITION Left 04/16/2017   Procedure: LEFT ULNAR NEUROPLASTY AT THE WRIST AND TRANSPOSITION AT THE ELBOW;  Surgeon: Milly Jakob, MD;  Location: [place];  Service: Orthopedics;  Laterality: Left;  . WISDOM TOOTH EXTRACTION      Social History   Socioeconomic History  . Marital status: Married    Spouse name: [name]   . Number of children: 2  . Years of education: Some colle  . Highest education level: Not on file  Occupational History  . Occupation: Retired  Tobacco Use  . Smoking status: Former Smoker    Quit date: 11/21/1980    Years since  quitting: 39.4  . Smokeless tobacco: Never Used  Vaping Use  . Vaping Use: Never used  Substance and Sexual Activity  . Alcohol use: Yes    Alcohol/week: 0.0 standard drinks    Comment: Very rare  . Drug use: No  . Sexual activity: Not on file  Other Topics Concern  . Not on file  Social History Narrative   Lives with wife   Caffeine use: tea every day   Social Determinants of Health   Financial Resource Strain: Not on file  Food Insecurity: Not on file  Transportation Needs: Not on file  Physical Activity: Not on file  Stress: Not on file  Social Connections: Not on file  Intimate Partner Violence: Not on file   Family History  Problem Relation Age of Onset  . Cancer Mother   . Liver disease Father   . Colon cancer Maternal Uncle 56  . Colon polyps Neg Hx   . Esophageal cancer Neg Hx   . Rectal cancer Neg Hx   . Stomach cancer Neg Hx     Current Outpatient Medications  Medication Sig Dispense Refill  . CELEBREX 200 MG capsule Take 200 mg by mouth 2 (two) times daily.    . Cholecalciferol (VITAMIN D) 125 MCG (5000 UT) CAPS Take 5,000 Units by mouth daily.    [name] docusate sodium (COLACE) 100 MG capsule Take 1 capsule (100 mg total) by mouth 2 (two) times daily. 10 capsule 0  . fluticasone (FLONASE) 50 MCG/ACT nasal spray     . lisinopril-hydrochlorothiazide (PRINZIDE,ZESTORETIC) 20-25 MG tablet Take 1 tablet by mouth daily.    . pantoprazole (PROTONIX) 40 MG tablet Take 40 mg by mouth daily.    . prednisoLONE acetate (PRED FORTE) 1 % ophthalmic suspension Place 1 drop into the left eye 4 (four) times daily for 7 days. 10 mL 0  . Psyllium (EQ DAILY FIBER PO) Take 5 tablets by mouth daily.    . rosuvastatin (CRESTOR) 5 MG tablet Take 5 mg by mouth daily.    . Semaglutide,0.25 or 0.5MG /DOS, (OZEMPIC, 0.25 OR 0.5 MG/DOSE,) 2 MG/1.5ML SOPN     . Tamsulosin HCl (FLOMAX) 0.4 MG CAPS Take 0.4 mg by mouth at bedtime.     . traMADol (ULTRAM) 50 MG tablet     .  HYDROcodone-acetaminophen (NORCO) 7.5-325 MG tablet Take 1 tablet by mouth 4 (four) times daily as needed. (Patient not taking: Reported on 05/07/2020)     No current facility-administered medications for this visit.    Allergies  Allergen Reactions  . Pork-Derived Products Shortness Of Breath  . Shellfish Allergy Shortness Of Breath  . Lovastatin Other (See Comments)  Bone pain   . Penicillins     UNSPECIFIED CHILDHOOD REACTION  Has patient had a PCN reaction causing immediate rash, facial/tongue/throat swelling, SOB or lightheadedness with hypotension: Unknown Has patient had a PCN reaction causing severe rash involving mucus membranes or skin necrosis: Unknown Has patient had a PCN reaction that required hospitalization: Unknown Has patient had a PCN reaction occurring within the last 10 years: No If all of the above answers are "NO", then may proceed with Cephalosporin use.      REVIEW OF SYSTEMS:   [X]  denotes positive finding, [ ]  denotes negative finding Cardiac  Comments:  Chest pain or chest pressure:    Shortness of breath upon exertion:    Short of breath when lying flat:    Irregular heart rhythm:        Vascular    Pain in calf, thigh, or hip brought on by ambulation:    Pain in feet at night that wakes you up from your sleep:     Blood clot in your veins:    Leg swelling:         Pulmonary    Oxygen at home:    Productive cough:     Wheezing:         Neurologic    Sudden weakness in arms or legs:     Sudden numbness in arms or legs:     Sudden onset of difficulty speaking or slurred speech:    Temporary loss of vision in one eye:     Problems with dizziness:         Gastrointestinal    Blood in stool:     Vomited blood:         Genitourinary    Burning when urinating:     Blood in urine:        Psychiatric    Major depression:         Hematologic    Bleeding problems:    Problems with blood clotting too easily:        Skin    Rashes or  ulcers:        Constitutional    Fever or chills:      PHYSICAL EXAMINATION:  Vitals:   05/07/20 0928  BP: (!) 144/84  Pulse: 67  Resp: 20  Temp: 98.6 F (37 C)  TempSrc: Temporal  SpO2: 99%  Weight: 229 lb 11.2 oz (104.2 kg)  Height: 5\' 7"  (1.702 m)    General:  WDWN in NAD; vital signs documented above Gait: Not observed HENT: WNL, normocephalic Pulmonary: normal non-labored breathing , without Rales, rhonchi,  wheezing Cardiac: regular HR, without  Murmurs without carotid bruit Abdomen: soft, NT, no masses Skin: without rashes Vascular Exam/Pulses: 2+ bilateral brachial, radial, femoral, dorsalis pedis and posterior tibial pulses. Extremities: without ischemic changes, without Gangrene , without cellulitis; without open wounds;  Musculoskeletal: no muscle wasting or atrophy  Neurologic: A&O X 3;  No focal weakness or paresthesias are detected Psychiatric:  The pt has Normal affect.   Non-Invasive Vascular Imaging:   05/07/2020 Abdominal Aorta: The largest aortic measurement is 2.3 cm and based on  limited visualization.  Technically difficult exam due to overlying bowel gas and body habitus.       ASSESSMENT/PLAN:: 71 y.o. male here for follow up for abdominal aortic aneurysm.  His study today was suboptimal due to overlying bowel gas.  I have recommended we repeat the study in 6 months and have asked him  to avoid foods that cause intestinal gas and to eat a clear liquid dinner the night before his study.  He will be n.p.o. that morning and will take Gas-X prior to duplex exam.  Barbie Banner, PA-C [facility] [phone]  Clinic MD:   Dr. Trula Slade on call

## 2020-05-12 ENCOUNTER — Other Ambulatory Visit: Payer: Self-pay

## 2020-05-12 DIAGNOSIS — I714 Abdominal aortic aneurysm, without rupture, unspecified: Secondary | ICD-10-CM

## 2020-05-12 DIAGNOSIS — H8110 Benign paroxysmal vertigo, unspecified ear: Secondary | ICD-10-CM | POA: Diagnosis not present

## 2020-05-12 DIAGNOSIS — M4316 Spondylolisthesis, lumbar region: Secondary | ICD-10-CM | POA: Diagnosis not present

## 2020-05-12 DIAGNOSIS — Z9181 History of falling: Secondary | ICD-10-CM | POA: Diagnosis not present

## 2020-05-14 DIAGNOSIS — Z9181 History of falling: Secondary | ICD-10-CM | POA: Diagnosis not present

## 2020-05-14 DIAGNOSIS — M4316 Spondylolisthesis, lumbar region: Secondary | ICD-10-CM | POA: Diagnosis not present

## 2020-05-14 IMAGING — CT CT BIOPSY
1 of 9 series · 3 of 16 positions shown, 4 images · non-contrast
Comparison: none

CLINICAL DATA: Chronic lower back pain. Prior L3-L5 fusion.
Diagnostic injection of bilateral L5-S1 facet joints requested to
evaluate pain generator.

[Series 19: needle -guided injection · axial · 0.93mm/px · z∈[+1054,+1104]mm · 3 of 26 slices shown, 4 images]
[im 1/26  soft-tissue]
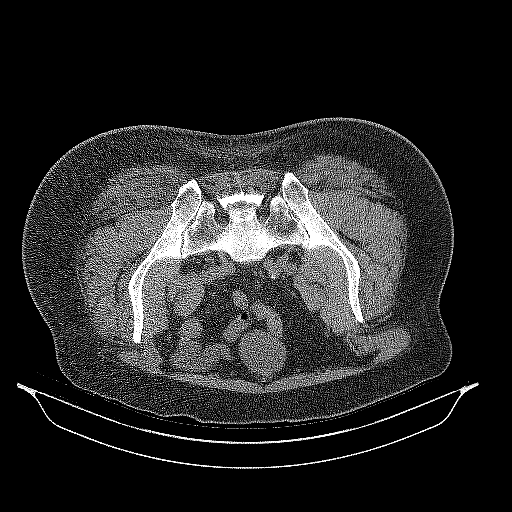
[im 1/26  bone]
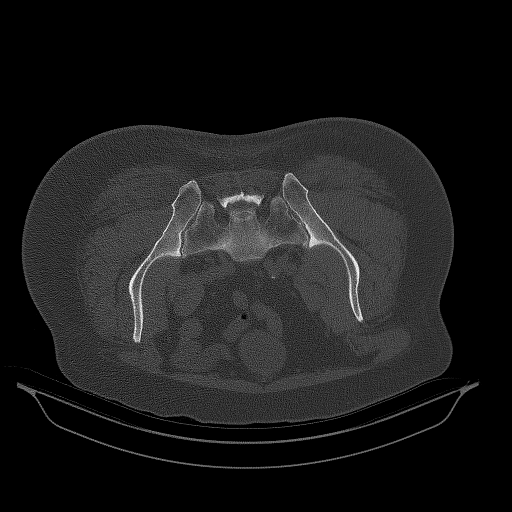
[im 13/26  soft-tissue]
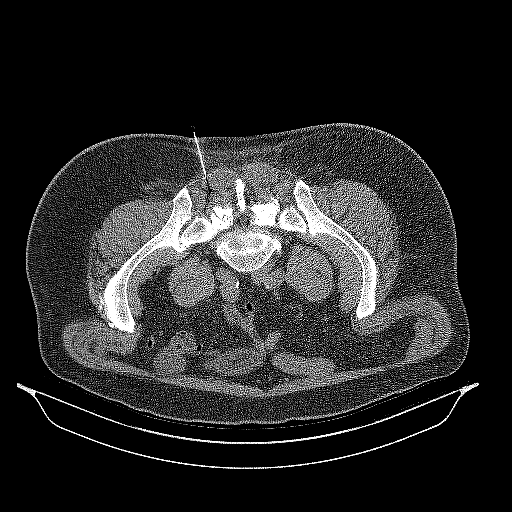
[im 26/26  soft-tissue]
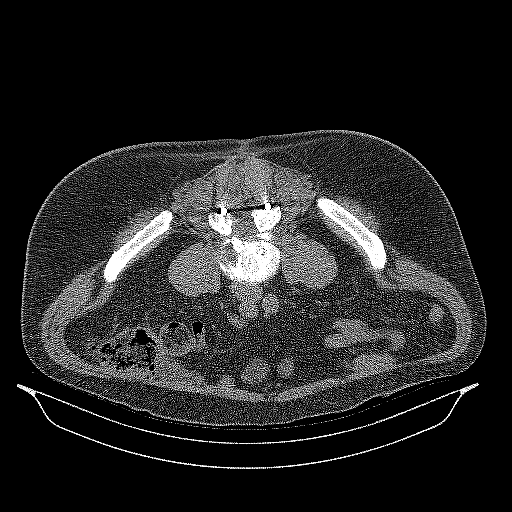

[3 of 16 positions shown; findings below may reference images not displayed]

PROCEDURE:
CT-GUIDED BILATERAL L5-S1 FACET JOINT INJECTION

After a thorough discussion of risks and benefits of the procedure,
including bleeding, infection, injury to nerves, blood vessels, and
adjacent structures, verbal and written consent was obtained. The
patient was placed prone on the CT table and localization was
performed over the L5-S1 facet joints. Target sites were marked
using CT guidance. The skin was prepped and draped in the usual
sterile fashion using Betadine soap.

After local anesthesia with 1% lidocaine without epinephrine and
subsequent deep anesthesia, a 3.5 inch 22 gauge spinal needle was
advanced into the left L5-S1 facet joint. Injection of 0.5 ml
Isovue-M 200 confirmed intra-articular placement. No vascular uptake
present. Subsequently, 1 cc of 0.5% bupivacaine was injected into
the left L5-S1 facet joint. Needle was removed.

After local anesthesia with 1% lidocaine without epinephrine and
subsequent deep anesthesia, a 3.5 inch 22 gauge spinal needle was
advanced into the right L5-S1 facet joint. Injection of 0.5 ml
Isovue-M 200 confirmed intra-articular placement. No vascular uptake
present. Subsequently, 1 cc of 0.5% bupivacaine was injected into
the right L5-S1 facet joint. Needle was removed and a sterile
dressing applied.

No complications were observed. The patient was observed and
released under the care of a driver after 30 minutes.
IMPRESSION: Successful CT guided bilateral L5-S1 facet joint injections with
anesthetic only.

## 2020-05-17 DIAGNOSIS — M19041 Primary osteoarthritis, right hand: Secondary | ICD-10-CM | POA: Diagnosis not present

## 2020-05-17 DIAGNOSIS — M19042 Primary osteoarthritis, left hand: Secondary | ICD-10-CM | POA: Diagnosis not present

## 2020-05-18 DIAGNOSIS — Z9181 History of falling: Secondary | ICD-10-CM | POA: Diagnosis not present

## 2020-05-18 DIAGNOSIS — M4316 Spondylolisthesis, lumbar region: Secondary | ICD-10-CM | POA: Diagnosis not present

## 2020-05-19 NOTE — Progress Notes (Signed)
[facility] Note  05/21/2020     CHIEF COMPLAINT Patient presents for Retina Follow Up   HISTORY OF PRESENT ILLNESS: Walter White. is a 70 y.o. male who presents to the clinic today for:   HPI    Retina Follow Up    Patient presents with  Retinal Break/Detachment.  In left eye.  Duration of 2 weeks.  I, the attending physician,  performed the HPI with the patient and updated documentation appropriately.          Comments    2 week follow up Retinopexy for RT OS- Since last visit he has noticed multiple areas of questionable blood OS.  Vision seems ok until the debris floats in his vision.        Last edited by [name], MD on 05/21/2020  4:50 PM. (History)    pt states he is still seeing "spider webs" in his vision,he feels like he is seeing more "dots" as well now  Referring physician: [name], MD [address] [place],  [place] [address]  HISTORICAL INFORMATION:   Selected notes from the MEDICAL RECORD NUMBER Referred by Dr. [name] LEE:  Ocular Hx- PMH-    CURRENT MEDICATIONS: No current outpatient medications on file. (Ophthalmic Drugs)   No current facility-administered medications for this visit. (Ophthalmic Drugs)   Current Outpatient Medications (Other)  Medication Sig  . CELEBREX 200 MG capsule Take 200 mg by mouth 2 (two) times daily.  . Cholecalciferol (VITAMIN D) 125 MCG (5000 UT) CAPS Take 5,000 Units by mouth daily.  [name] docusate sodium (COLACE) 100 MG capsule Take 1 capsule (100 mg total) by mouth 2 (two) times daily.  . fluticasone (FLONASE) 50 MCG/ACT nasal spray   . gabapentin (NEURONTIN) 100 MG capsule Take 100 mg by mouth 2 (two) times daily.  [name] lisinopril-hydrochlorothiazide (PRINZIDE,ZESTORETIC) 20-25 MG tablet Take 1 tablet by mouth daily.  . pantoprazole (PROTONIX) 40 MG tablet Take 40 mg by mouth daily.  . Psyllium (EQ DAILY FIBER PO) Take 5 tablets by mouth daily.  . rosuvastatin  (CRESTOR) 5 MG tablet Take 5 mg by mouth daily.  . Semaglutide,0.25 or 0.5MG /DOS, (OZEMPIC, 0.25 OR 0.5 MG/DOSE,) 2 MG/1.5ML SOPN   . Tamsulosin HCl (FLOMAX) 0.4 MG CAPS Take 0.4 mg by mouth at bedtime.   . traMADol (ULTRAM) 50 MG tablet   . HYDROcodone-acetaminophen (NORCO) 7.5-325 MG tablet Take 1 tablet by mouth 4 (four) times daily as needed. (Patient not taking: Reported on 05/21/2020)   No current facility-administered medications for this visit. (Other)      REVIEW OF SYSTEMS: ROS    Positive for: Cardiovascular, Eyes   Negative for: Constitutional, Gastrointestinal, Neurological, Skin, Genitourinary, Musculoskeletal, HENT, Endocrine, Respiratory, Psychiatric, Allergic/Imm, Heme/Lymph   Last edited by [name], COA on 05/21/2020  8:39 AM. (History)       ALLERGIES Allergies  Allergen Reactions  . Pork-Derived Products Shortness Of Breath  . Shellfish Allergy Shortness Of Breath  . Lovastatin Other (See Comments)    Bone pain   . Penicillins     UNSPECIFIED CHILDHOOD REACTION  Has patient had a PCN reaction causing immediate rash, facial/tongue/throat swelling, SOB or lightheadedness with hypotension: Unknown Has patient had a PCN reaction causing severe rash involving mucus membranes or skin necrosis: Unknown Has patient had a PCN reaction that required hospitalization: Unknown Has patient had a PCN reaction occurring within the last 10 years: No If all  of the above answers are "NO", then may proceed with Cephalosporin use.     PAST MEDICAL HISTORY Past Medical History:  Diagnosis Date  . AAA (abdominal aortic aneurysm) (HCC)    4.0 cm AAA 08/22/17 U/S  . Allergy    Allergic Rhinitis  . Anemia   . Arthritis    spinal steniosis  . Benign prostate hyperplasia    with lower urinary tract symptoms  . Carpal tunnel syndrome on right   . Cataracts, both eyes   . Chronic back pain    caused by spinal stenosis  . Elevated PSA   . GERD (gastroesophageal reflux  disease)   . Hyperlipidemia   . Hypertension    Essential.    . MGUS (monoclonal gammopathy of unknown significance)   . Microhematuria   . OA (osteoarthritis) of knee   . Pneumonia    Hx -when very young  . Pre-diabetes   . Seasonal allergies   . Spinal stenosis    of lumbosacral region.  Causes chronic back pain.  [name] Upper abdominal pain    Past Surgical History:  Procedure Laterality Date  . ABDOMINAL EXPOSURE N/A 09/05/2017   Procedure: ABDOMINAL EXPOSURE;  Surgeon: Rosetta Posner, MD;  Location: [facility];  Service: Vascular;  Laterality: N/A;  . ANTERIOR LAT LUMBAR FUSION Left 01/26/2016   Procedure: LEFT SIDED LATERAL INTERBODY FUSION, LUMBAR 3-4 WITH INSTRUMENTATION AND ALLOGRAFT;  Surgeon: Phylliss Bob, MD;  Location: [place];  Service: Orthopedics;  Laterality: Left;  LEFT SIDED LATERAL INTERBODY FUSION, LUMBAR 3-4 WITH INSTRUMENTATION AND ALLOGRAFT  . ANTERIOR LAT LUMBAR FUSION Right 08/14/2018   Procedure: RIGHT LUMBAR two to LUMBAR three LATERAL INTERBODY FUSION WITH INSTRUMENTATION AND ALLOGRAFT;  Surgeon: Phylliss Bob, MD;  Location: [place];  Service: Orthopedics;  Laterality: Right;  . ANTERIOR LATERAL LUMBAR FUSION WITH PERCUTANEOUS SCREW 1 LEVEL N/A 09/05/2017   Procedure: LUMBAR 5 - SACRUM 1 ANTERIOR LUMBAR INTERBODY FUSION WITH INSTRUMENTATION AND ALLOGRAFT;  Surgeon: Phylliss Bob, MD;  Location: [place];  Service: Orthopedics;  Laterality: N/A;  . BACK SURGERY  01/2015   dr. Lynann Bologna. [id number].  [name] CARPAL TUNNEL RELEASE     right   . CATARACT EXTRACTION, BILATERAL    . COLONOSCOPY    . COLONOSCOPY  [phone]   polyp  . ELBOW SURGERY     right ulnar release  . ELBOW SURGERY     LEFT CTR  . EYE SURGERY    . JOINT REPLACEMENT     left- Sept 2016  . KNEE ARTHROSCOPY  11/23/2010   Procedure: ARTHROSCOPY KNEE;  Surgeon: Kerin Salen;  Location: [place];  Service: Orthopedics;  Laterality: Left;  arthroscopy left knee  . KNEE ARTHROSCOPY     left x 2   . Knee scar tissue removal Left 2018  . LATERAL FUSION LUMBAR SPINE  01./17/2018  . polypectomy  2010  . SHOULDER ARTHROSCOPY Left    x2  . TONSILLECTOMY    . TOTAL KNEE ARTHROPLASTY Left 09/21/2014   Procedure: TOTAL KNEE ARTHROPLASTY;  Surgeon: Frederik Pear, MD;  Location: [place];  Service: Orthopedics;  Laterality: Left;  . ULNAR NERVE TRANSPOSITION Left 04/16/2017   Procedure: LEFT ULNAR NEUROPLASTY AT THE WRIST AND TRANSPOSITION AT THE ELBOW;  Surgeon: Milly Jakob, MD;  Location: [place];  Service: Orthopedics;  Laterality: Left;  . WISDOM TOOTH EXTRACTION      FAMILY HISTORY Family History  Problem Relation Age of Onset  .  Cancer Mother   . Liver disease Father   . Colon cancer Maternal Uncle 71  . Colon polyps Neg Hx   . Esophageal cancer Neg Hx   . Rectal cancer Neg Hx   . Stomach cancer Neg Hx     SOCIAL HISTORY Social History   Tobacco Use  . Smoking status: Former Smoker    Quit date: 11/21/1980    Years since quitting: 39.5  . Smokeless tobacco: Never Used  Vaping Use  . Vaping Use: Never used  Substance Use Topics  . Alcohol use: Yes    Alcohol/week: 0.0 standard drinks    Comment: Very rare  . Drug use: No         OPHTHALMIC EXAM:  Base Eye Exam    Visual Acuity (Snellen - Linear)      Right Left   Dist [place] 20/30 +2 20/30   Dist ph [place] 20/25 NI       Tonometry (Tonopen, 8:47 AM)      Right Left   Pressure 15 12       Pupils      Dark Light Shape React APD   Right 2 1 Round Slow None   Left 2 1 Round Slow None       Visual Fields (Counting fingers)      Left Right    Full Full       Neuro/Psych    Oriented x3: Yes   Mood/Affect: Normal       Dilation    Both eyes: 1.0% Mydriacyl, 2.5% Phenylephrine @ 8:48 AM        Slit Lamp and Fundus Exam    Slit Lamp Exam      Right Left   Lids/Lashes Dermatochalasis - upper lid Dermatochalasis - upper lid   Conjunctiva/Sclera White and quiet White and quiet   Cornea  arcus, well healed cataract wound arcus, well healed cataract wound   Anterior Chamber Deep and quiet Deep and quiet   Iris Round and moderately dilated Round and moderately dilated   Lens PC IOL in good position PC IOL in good position   Vitreous Vitreous syneresis Vitreous syneresis, Posterior vitreous detachment, blood stained vitreous condensations centrally; +vitreous debris       Fundus Exam      Right Left   Disc Pink and Sharp Partially obscured, 2-3+Pallor, Sharp rim   C/D Ratio 0.2 0.5   Macula Flat, Blunted foveal reflex, RPE mottling, No heme or edema Flat, hazy view   Vessels mild attenuation, Copper wiring, mild tortuousity attenuated   Periphery Attached    Attached, horseshoe tear at 0230 -- good laser and cryo changes surrounding, mild pre-retinal heme settled inferiorly             IMAGING AND PROCEDURES  Imaging and Procedures for 05/21/2020  OCT, Retina - OU - Both Eyes       Right Eye Quality was good. Central Foveal Thickness: 280. Progression has been stable. Findings include normal foveal contour, no IRF, no SRF (Partial PVD).   Left Eye Quality was good. Central Foveal Thickness: 290. Progression has been stable. Findings include normal foveal contour, no IRF, no SRF (+vitreous opacities).   Notes *Images captured and stored on drive  Diagnosis / Impression:  NFP; no IRF/SRF OU +vitreous opacities OS  Clinical management:  See below  Abbreviations: NFP - Normal foveal profile. CME - cystoid macular edema. PED - pigment epithelial detachment. IRF - intraretinal fluid. SRF - subretinal  fluid. EZ - ellipsoid zone. ERM - epiretinal membrane. ORA - outer retinal atrophy. ORT - outer retinal tubulation. SRHM - subretinal hyper-reflective material. IRHM - intraretinal hyper-reflective material                 ASSESSMENT/PLAN:    ICD-10-CM   1. Retinal tear of left eye  H33.312   2. Retinal edema  H35.81 OCT, Retina - OU - Both Eyes  3.  Essential hypertension  I10   4. Hypertensive retinopathy of both eyes  H35.033   5. Pseudophakia, both eyes  Z96.1     1,2. Retinal tear, OS  - Acute PVD on Monday, 4.25.22, w/ symptomatic floaters  - was seen by [facility] 4.27.22 -- HST noted OS - horseshoe tear located at 0230 with punctate heme surrounding - s/p laser retinopexy + cryopexy OS (04.27.22) -- good laser and cryo scarring in place - completed PF QID x7 days - no new RT/RD - significant vitreous debris and blood-stained vitreous condensations - f/u in 2 wks, DFE, OCT  3,4. Hypertensive retinopathy OU - discussed importance of tight BP control - monitor  5. Pseudophakia OU  - s/p CE/IOL  - IOL in good position, doing well  - monitor   Ophthalmic Meds Ordered this visit:  No orders of the defined types were placed in this encounter.     Return in about 2 weeks (around 06/04/2020) for f/u retinal tear OS, DFE, OCT.  There are no Patient Instructions on file for this visit.   Explained the diagnoses, plan, and follow up with the patient and they expressed understanding.  Patient expressed understanding of the importance of proper follow up care.   This document serves as a record of services personally performed by Gardiner Sleeper, MD, PhD. It was created on their behalf by [name], an ophthalmic technician. The creation of this record is the provider's dictation and/or activities during the visit.    Electronically signed by: [name] COA, 05/21/20  4:53 PM   This document serves as a record of services personally performed by Gardiner Sleeper, MD, PhD. It was created on their behalf by San Jetty. Owens Shark, OA an ophthalmic technician. The creation of this record is the provider's dictation and/or activities during the visit.    Electronically signed by: San Jetty. Owens Shark, [place] 05.13.2022 4:53 PM  Gardiner Sleeper, M.D., Ph.D. Diseases & Surgery of the Retina and [place] 05/21/2020  I have reviewed the above documentation for accuracy and completeness, and I agree with the above. Gardiner Sleeper, M.D., Ph.D. 05/21/20 4:53 PM   Abbreviations: M myopia (nearsighted); A astigmatism; H hyperopia (farsighted); P presbyopia; Mrx spectacle prescription;  CTL contact lenses; OD right eye; OS left eye; OU both eyes  XT exotropia; ET esotropia; PEK punctate epithelial keratitis; PEE punctate epithelial erosions; DES dry eye syndrome; MGD meibomian gland dysfunction; ATs artificial tears; PFAT's preservative free artificial tears; [place] nuclear sclerotic cataract; PSC posterior subcapsular cataract; ERM epi-retinal membrane; PVD posterior vitreous detachment; RD retinal detachment; DM diabetes mellitus; DR diabetic retinopathy; NPDR non-proliferative diabetic retinopathy; PDR proliferative diabetic retinopathy; CSME clinically significant macular edema; DME diabetic macular edema; dbh dot blot hemorrhages; CWS cotton wool spot; POAG primary open angle glaucoma; C/D cup-to-disc ratio; HVF humphrey visual field; GVF goldmann visual field; OCT optical coherence tomography; IOP intraocular pressure; BRVO Branch retinal vein occlusion; CRVO central retinal vein occlusion; CRAO central retinal artery occlusion; BRAO branch  retinal artery occlusion; RT retinal tear; SB scleral buckle; PPV pars plana vitrectomy; VH Vitreous hemorrhage; PRP panretinal laser photocoagulation; IVK intravitreal kenalog; VMT vitreomacular traction; MH Macular hole;  NVD neovascularization of the disc; NVE neovascularization elsewhere; AREDS age related eye disease study; ARMD age related macular degeneration; POAG primary open angle glaucoma; EBMD epithelial/anterior basement membrane dystrophy; ACIOL anterior chamber intraocular lens; IOL intraocular lens; PCIOL posterior chamber intraocular lens; Phaco/IOL phacoemulsification with intraocular lens placement; [place] photorefractive keratectomy; LASIK laser  assisted in situ keratomileusis; HTN hypertension; DM diabetes mellitus; COPD chronic obstructive pulmonary disease

## 2020-05-20 DIAGNOSIS — M25561 Pain in right knee: Secondary | ICD-10-CM | POA: Diagnosis not present

## 2020-05-20 DIAGNOSIS — M4316 Spondylolisthesis, lumbar region: Secondary | ICD-10-CM | POA: Diagnosis not present

## 2020-05-20 DIAGNOSIS — Z9181 History of falling: Secondary | ICD-10-CM | POA: Diagnosis not present

## 2020-05-20 DIAGNOSIS — M1711 Unilateral primary osteoarthritis, right knee: Secondary | ICD-10-CM | POA: Diagnosis not present

## 2020-05-21 ENCOUNTER — Encounter (INDEPENDENT_AMBULATORY_CARE_PROVIDER_SITE_OTHER): Payer: Self-pay | Admitting: Ophthalmology

## 2020-05-21 ENCOUNTER — Ambulatory Visit (INDEPENDENT_AMBULATORY_CARE_PROVIDER_SITE_OTHER): Payer: Medicare Other | Admitting: Ophthalmology

## 2020-05-21 ENCOUNTER — Other Ambulatory Visit: Payer: Self-pay

## 2020-05-21 DIAGNOSIS — H3581 Retinal edema: Secondary | ICD-10-CM

## 2020-05-21 DIAGNOSIS — H33312 Horseshoe tear of retina without detachment, left eye: Secondary | ICD-10-CM | POA: Diagnosis not present

## 2020-05-21 DIAGNOSIS — I10 Essential (primary) hypertension: Secondary | ICD-10-CM

## 2020-05-21 DIAGNOSIS — Z961 Presence of intraocular lens: Secondary | ICD-10-CM

## 2020-05-21 DIAGNOSIS — H35033 Hypertensive retinopathy, bilateral: Secondary | ICD-10-CM | POA: Diagnosis not present

## 2020-05-25 DIAGNOSIS — Z9181 History of falling: Secondary | ICD-10-CM | POA: Diagnosis not present

## 2020-05-25 DIAGNOSIS — M4316 Spondylolisthesis, lumbar region: Secondary | ICD-10-CM | POA: Diagnosis not present

## 2020-05-27 DIAGNOSIS — Z9181 History of falling: Secondary | ICD-10-CM | POA: Diagnosis not present

## 2020-05-27 DIAGNOSIS — M4316 Spondylolisthesis, lumbar region: Secondary | ICD-10-CM | POA: Diagnosis not present

## 2020-05-27 NOTE — Progress Notes (Signed)
[facility] Note  06/04/2020     CHIEF COMPLAINT Patient presents for Retina Follow Up   HISTORY OF PRESENT ILLNESS: Walter White. is a 70 y.o. male who presents to the clinic today for:   HPI    Retina Follow Up    Patient presents with  Retinal Break/Detachment.  In left eye.  Severity is moderate.  Duration of 6 weeks.  Since onset it is stable.  I, the attending physician,  performed the HPI with the patient and updated documentation appropriately.          Comments    Patient states vision the same OU. Occasional flash OS. Floaters seem a little bigger OS. Floaters get in the way OS when patient tries to read.       Last edited by [name], MD on 06/06/2020  2:53 AM. (History)    pt states he still have the floaters in his central vision, he states they are started to mess with his reading vision, while driving home last night he saw a couple fol in his left eye  Referring physician: [name], MD [address] [place],  [place] [address]  HISTORICAL INFORMATION:   Selected notes from the [facility] Referred by Dr. [name] LEE:  Ocular Hx- PMH-    CURRENT MEDICATIONS: No current outpatient medications on file. (Ophthalmic Drugs)   No current facility-administered medications for this visit. (Ophthalmic Drugs)   Current Outpatient Medications (Other)  Medication Sig  . CELEBREX 200 MG capsule Take 200 mg by mouth 2 (two) times daily.  . Cholecalciferol (VITAMIN D) 125 MCG (5000 UT) CAPS Take 5,000 Units by mouth daily.  [name] docusate sodium (COLACE) 100 MG capsule Take 1 capsule (100 mg total) by mouth 2 (two) times daily.  . fluticasone (FLONASE) 50 MCG/ACT nasal spray   . gabapentin (NEURONTIN) 100 MG capsule Take 100 mg by mouth 2 (two) times daily.  [name] lisinopril-hydrochlorothiazide (PRINZIDE,ZESTORETIC) 20-25 MG tablet Take 1 tablet by mouth daily.  . pantoprazole (PROTONIX) 40 MG tablet Take  40 mg by mouth daily.  . Psyllium (EQ DAILY FIBER PO) Take 5 tablets by mouth daily.  . rosuvastatin (CRESTOR) 5 MG tablet Take 5 mg by mouth daily.  . Semaglutide,0.25 or 0.5MG /DOS, (OZEMPIC, 0.25 OR 0.5 MG/DOSE,) 2 MG/1.5ML SOPN   . Tamsulosin HCl (FLOMAX) 0.4 MG CAPS Take 0.4 mg by mouth at bedtime.   . traMADol (ULTRAM) 50 MG tablet   . HYDROcodone-acetaminophen (NORCO) 7.5-325 MG tablet Take 1 tablet by mouth 4 (four) times daily as needed. (Patient not taking: No sig reported)   No current facility-administered medications for this visit. (Other)      REVIEW OF SYSTEMS: ROS    Positive for: Cardiovascular, Eyes   Negative for: Constitutional, Gastrointestinal, Neurological, Skin, Genitourinary, Musculoskeletal, HENT, Endocrine, Respiratory, Psychiatric, Allergic/Imm, Heme/Lymph   Last edited by Roselee Nova D, COT on 06/04/2020  8:25 AM. (History)       ALLERGIES Allergies  Allergen Reactions  . Pork-Derived Products Shortness Of Breath  . Shellfish Allergy Shortness Of Breath  . Lovastatin Other (See Comments)    Bone pain   . Penicillins     UNSPECIFIED CHILDHOOD REACTION  Has patient had a PCN reaction causing immediate rash, facial/tongue/throat swelling, SOB or lightheadedness with hypotension: Unknown Has patient had a PCN reaction causing severe rash involving mucus membranes or skin necrosis: Unknown Has patient had a PCN  reaction that required hospitalization: Unknown Has patient had a PCN reaction occurring within the last 10 years: No If all of the above answers are "NO", then may proceed with Cephalosporin use.     PAST MEDICAL HISTORY Past Medical History:  Diagnosis Date  . AAA (abdominal aortic aneurysm) (HCC)    4.0 cm AAA 08/22/17 U/S  . Allergy    Allergic Rhinitis  . Anemia   . Arthritis    spinal steniosis  . Benign prostate hyperplasia    with lower urinary tract symptoms  . Carpal tunnel syndrome on right   . Cataracts, both eyes   .  Chronic back pain    caused by spinal stenosis  . Elevated PSA   . GERD (gastroesophageal reflux disease)   . Hyperlipidemia   . Hypertension    Essential.    . MGUS (monoclonal gammopathy of unknown significance)   . Microhematuria   . OA (osteoarthritis) of knee   . Pneumonia    Hx -when very young  . Pre-diabetes   . Seasonal allergies   . Spinal stenosis    of lumbosacral region.  Causes chronic back pain.  [name] Upper abdominal pain    Past Surgical History:  Procedure Laterality Date  . ABDOMINAL EXPOSURE N/A 09/05/2017   Procedure: ABDOMINAL EXPOSURE;  Surgeon: Rosetta Posner, MD;  Location: [facility];  Service: Vascular;  Laterality: N/A;  . ANTERIOR LAT LUMBAR FUSION Left 01/26/2016   Procedure: LEFT SIDED LATERAL INTERBODY FUSION, LUMBAR 3-4 WITH INSTRUMENTATION AND ALLOGRAFT;  Surgeon: Phylliss Bob, MD;  Location: [facility];  Service: Orthopedics;  Laterality: Left;  LEFT SIDED LATERAL INTERBODY FUSION, LUMBAR 3-4 WITH INSTRUMENTATION AND ALLOGRAFT  . ANTERIOR LAT LUMBAR FUSION Right 08/14/2018   Procedure: RIGHT LUMBAR two to LUMBAR three LATERAL INTERBODY FUSION WITH INSTRUMENTATION AND ALLOGRAFT;  Surgeon: Phylliss Bob, MD;  Location: [place];  Service: Orthopedics;  Laterality: Right;  . ANTERIOR LATERAL LUMBAR FUSION WITH PERCUTANEOUS SCREW 1 LEVEL N/A 09/05/2017   Procedure: LUMBAR 5 - SACRUM 1 ANTERIOR LUMBAR INTERBODY FUSION WITH INSTRUMENTATION AND ALLOGRAFT;  Surgeon: Phylliss Bob, MD;  Location: [facility];  Service: Orthopedics;  Laterality: N/A;  . BACK SURGERY  01/2015   dr. Lynann Bologna. [id number].  [name] CARPAL TUNNEL RELEASE     right   . CATARACT EXTRACTION, BILATERAL    . COLONOSCOPY    . COLONOSCOPY  [id number]   polyp  . ELBOW SURGERY     right ulnar release  . ELBOW SURGERY     LEFT CTR  . EYE SURGERY    . JOINT REPLACEMENT     left- Sept 2016  . KNEE ARTHROSCOPY  11/23/2010   Procedure: ARTHROSCOPY KNEE;  Surgeon: Kerin Salen;  Location: [facility];   Service: Orthopedics;  Laterality: Left;  arthroscopy left knee  . KNEE ARTHROSCOPY     left x 2  . Knee scar tissue removal Left 2018  . LATERAL FUSION LUMBAR SPINE  01./17/2018  . polypectomy  2010  . SHOULDER ARTHROSCOPY Left    x2  . TONSILLECTOMY    . TOTAL KNEE ARTHROPLASTY Left 09/21/2014   Procedure: TOTAL KNEE ARTHROPLASTY;  Surgeon: Frederik Pear, MD;  Location: [place];  Service: Orthopedics;  Laterality: Left;  . ULNAR NERVE TRANSPOSITION Left 04/16/2017   Procedure: LEFT ULNAR NEUROPLASTY AT THE WRIST AND TRANSPOSITION AT THE ELBOW;  Surgeon: Milly Jakob, MD;  Location: [facility];  Service: Orthopedics;  Laterality: Left;  .  WISDOM TOOTH EXTRACTION      FAMILY HISTORY Family History  Problem Relation Age of Onset  . Cancer Mother   . Liver disease Father   . Colon cancer Maternal Uncle 50  . Colon polyps Neg Hx   . Esophageal cancer Neg Hx   . Rectal cancer Neg Hx   . Stomach cancer Neg Hx     SOCIAL HISTORY Social History   Tobacco Use  . Smoking status: Former Smoker    Quit date: 11/21/1980    Years since quitting: 39.5  . Smokeless tobacco: Never Used  Vaping Use  . Vaping Use: Never used  Substance Use Topics  . Alcohol use: Yes    Alcohol/week: 0.0 standard drinks    Comment: Very rare  . Drug use: No         OPHTHALMIC EXAM:  Base Eye Exam    Visual Acuity (Snellen - Linear)      Right Left   Dist [place] 20/25 +1 20/25   Dist ph [place] 20/20 -2 NI       Tonometry (Tonopen, 8:34 AM)      Right Left   Pressure 08 08       Pupils      Dark Light Shape React APD   Right 2 1 Round Brisk None   Left 2 1 Round Brisk None       Visual Fields (Counting fingers)      Left Right    Full Full       Extraocular Movement      Right Left    Full, Ortho Full, Ortho       Neuro/Psych    Oriented x3: Yes   Mood/Affect: Normal       Dilation    Both eyes: 1.0% Mydriacyl, 2.5% Phenylephrine @ 8:34 AM        Slit Lamp and  Fundus Exam    Slit Lamp Exam      Right Left   Lids/Lashes Dermatochalasis - upper lid Dermatochalasis - upper lid   Conjunctiva/Sclera White and quiet White and quiet   Cornea arcus, well healed cataract wound arcus, well healed cataract wound, trace PEE   Anterior Chamber Deep and quiet Deep and quiet   Iris Round and moderately dilated Round and moderately dilated   Lens PC IOL in good position PC IOL in good position   Vitreous Vitreous syneresis Vitreous syneresis, Posterior vitreous detachment, blood stained vitreous condensations centrally - improving and settling inferiorly; +vitreous debris       Fundus Exam      Right Left   Disc Pink and Sharp Partially obscured, 2-3+Pallor, Sharp rim   C/D Ratio 0.2 0.5   Macula Flat, Blunted foveal reflex, RPE mottling, No heme or edema Flat, hazy view   Vessels mild attenuation, Copper wiring, mild tortuousity attenuated, mild tortuousity   Periphery Attached    Attached, horseshoe tear at 0230 -- good laser and cryo changes surrounding, mild pre-retinal heme settled inferiorly, no new RT/RD          IMAGING AND PROCEDURES  Imaging and Procedures for 06/04/2020  OCT, Retina - OU - Both Eyes       Right Eye Quality was good. Central Foveal Thickness: 280. Progression has been stable. Findings include normal foveal contour, no IRF, no SRF (Partial PVD).   Left Eye Quality was good. Central Foveal Thickness: 285. Progression has improved. Findings include normal foveal contour, no IRF, no SRF, retinal drusen  (  Interval improvement in vitreous opacities).   Notes *Images captured and stored on drive  Diagnosis / Impression:  NFP; no IRF/SRF OU OS: Interval improvement in vitreous opacities  Clinical management:  See below  Abbreviations: NFP - Normal foveal profile. CME - cystoid macular edema. PED - pigment epithelial detachment. IRF - intraretinal fluid. SRF - subretinal fluid. EZ - ellipsoid zone. ERM - epiretinal membrane.  ORA - outer retinal atrophy. ORT - outer retinal tubulation. SRHM - subretinal hyper-reflective material. IRHM - intraretinal hyper-reflective material                 ASSESSMENT/PLAN:    ICD-10-CM   1. Retinal tear of left eye  H33.312   2. Retinal edema  H35.81 OCT, Retina - OU - Both Eyes  3. Essential hypertension  I10   4. Hypertensive retinopathy of both eyes  H35.033   5. Pseudophakia, both eyes  Z96.1     1,2. Retinal tear, OS  - Acute PVD on Monday, 4.25.22, w/ symptomatic floaters  - was seen by [facility] 4.27.22 -- HST noted OS - horseshoe tear located at 0230 with punctate heme surrounding - s/p laser retinopexy + cryopexy OS (04.27.22) -- good laser and cryo scarring in place - no new RT/RD - significant vitreous debris and blood-stained vitreous condensations -- improving - f/u in 2-3 wks, DFE, OCT  3,4. Hypertensive retinopathy OU - discussed importance of tight BP control - monitor   5. Pseudophakia OU  - s/p CE/IOL  - IOL in good position, doing well  - monitor   Ophthalmic Meds Ordered this visit:  No orders of the defined types were placed in this encounter.     Return for f/u 2-3 weeks, retinal tear OS, DFE, OCT.  There are no Patient Instructions on file for this visit.   Explained the diagnoses, plan, and follow up with the patient and they expressed understanding.  Patient expressed understanding of the importance of proper follow up care.   This document serves as a record of services personally performed by Gardiner Sleeper, MD, PhD. It was created on their behalf by [name], an ophthalmic technician. The creation of this record is the provider's dictation and/or activities during the visit.    Electronically signed by: [name] COA, 06/06/20  2:56 AM   This document serves as a record of services personally performed by Gardiner Sleeper, MD, PhD. It was created on their behalf by San Jetty. Owens Shark, OA an ophthalmic technician.  The creation of this record is the provider's dictation and/or activities during the visit.    Electronically signed by: San Jetty. Owens Shark, [place] 05.27.2022 2:56 AM   Gardiner Sleeper, M.D., Ph.D. Diseases & Surgery of the Retina and [place] 06/04/2020  I have reviewed the above documentation for accuracy and completeness, and I agree with the above. Gardiner Sleeper, M.D., Ph.D. 06/06/20 2:56 AM   Abbreviations: M myopia (nearsighted); A astigmatism; H hyperopia (farsighted); P presbyopia; Mrx spectacle prescription;  CTL contact lenses; OD right eye; OS left eye; OU both eyes  XT exotropia; ET esotropia; PEK punctate epithelial keratitis; PEE punctate epithelial erosions; DES dry eye syndrome; MGD meibomian gland dysfunction; ATs artificial tears; PFAT's preservative free artificial tears; [place] nuclear sclerotic cataract; PSC posterior subcapsular cataract; ERM epi-retinal membrane; PVD posterior vitreous detachment; RD retinal detachment; DM diabetes mellitus; DR diabetic retinopathy; NPDR non-proliferative diabetic retinopathy; PDR proliferative diabetic retinopathy; CSME clinically significant macular  edema; DME diabetic macular edema; dbh dot blot hemorrhages; CWS cotton wool spot; POAG primary open angle glaucoma; C/D cup-to-disc ratio; HVF humphrey visual field; GVF goldmann visual field; OCT optical coherence tomography; IOP intraocular pressure; BRVO Branch retinal vein occlusion; CRVO central retinal vein occlusion; CRAO central retinal artery occlusion; BRAO branch retinal artery occlusion; RT retinal tear; SB scleral buckle; PPV pars plana vitrectomy; VH Vitreous hemorrhage; PRP panretinal laser photocoagulation; IVK intravitreal kenalog; VMT vitreomacular traction; MH Macular hole;  NVD neovascularization of the disc; NVE neovascularization elsewhere; AREDS age related eye disease study; ARMD age related macular degeneration; POAG primary open angle glaucoma;  EBMD epithelial/anterior basement membrane dystrophy; ACIOL anterior chamber intraocular lens; IOL intraocular lens; PCIOL posterior chamber intraocular lens; Phaco/IOL phacoemulsification with intraocular lens placement; [place] photorefractive keratectomy; LASIK laser assisted in situ keratomileusis; HTN hypertension; DM diabetes mellitus; COPD chronic obstructive pulmonary disease

## 2020-06-01 DIAGNOSIS — Z9181 History of falling: Secondary | ICD-10-CM | POA: Diagnosis not present

## 2020-06-01 DIAGNOSIS — M4316 Spondylolisthesis, lumbar region: Secondary | ICD-10-CM | POA: Diagnosis not present

## 2020-06-03 DIAGNOSIS — M19049 Primary osteoarthritis, unspecified hand: Secondary | ICD-10-CM | POA: Diagnosis not present

## 2020-06-04 ENCOUNTER — Encounter (INDEPENDENT_AMBULATORY_CARE_PROVIDER_SITE_OTHER): Payer: Self-pay | Admitting: Ophthalmology

## 2020-06-04 ENCOUNTER — Other Ambulatory Visit: Payer: Self-pay

## 2020-06-04 ENCOUNTER — Ambulatory Visit (INDEPENDENT_AMBULATORY_CARE_PROVIDER_SITE_OTHER): Payer: Medicare Other | Admitting: Ophthalmology

## 2020-06-04 DIAGNOSIS — I10 Essential (primary) hypertension: Secondary | ICD-10-CM | POA: Diagnosis not present

## 2020-06-04 DIAGNOSIS — M4316 Spondylolisthesis, lumbar region: Secondary | ICD-10-CM | POA: Diagnosis not present

## 2020-06-04 DIAGNOSIS — H35033 Hypertensive retinopathy, bilateral: Secondary | ICD-10-CM

## 2020-06-04 DIAGNOSIS — H3581 Retinal edema: Secondary | ICD-10-CM | POA: Diagnosis not present

## 2020-06-04 DIAGNOSIS — H33312 Horseshoe tear of retina without detachment, left eye: Secondary | ICD-10-CM | POA: Diagnosis not present

## 2020-06-04 DIAGNOSIS — Z961 Presence of intraocular lens: Secondary | ICD-10-CM

## 2020-06-04 DIAGNOSIS — Z9181 History of falling: Secondary | ICD-10-CM | POA: Diagnosis not present

## 2020-06-06 ENCOUNTER — Encounter (INDEPENDENT_AMBULATORY_CARE_PROVIDER_SITE_OTHER): Payer: Self-pay | Admitting: Ophthalmology

## 2020-06-08 DIAGNOSIS — M19042 Primary osteoarthritis, left hand: Secondary | ICD-10-CM | POA: Diagnosis not present

## 2020-06-08 DIAGNOSIS — M19041 Primary osteoarthritis, right hand: Secondary | ICD-10-CM | POA: Diagnosis not present

## 2020-06-09 DIAGNOSIS — M4316 Spondylolisthesis, lumbar region: Secondary | ICD-10-CM | POA: Diagnosis not present

## 2020-06-09 DIAGNOSIS — Z9181 History of falling: Secondary | ICD-10-CM | POA: Diagnosis not present

## 2020-06-10 DIAGNOSIS — M4316 Spondylolisthesis, lumbar region: Secondary | ICD-10-CM | POA: Diagnosis not present

## 2020-06-10 DIAGNOSIS — Z9181 History of falling: Secondary | ICD-10-CM | POA: Diagnosis not present

## 2020-06-15 DIAGNOSIS — Z9181 History of falling: Secondary | ICD-10-CM | POA: Diagnosis not present

## 2020-06-15 DIAGNOSIS — M4316 Spondylolisthesis, lumbar region: Secondary | ICD-10-CM | POA: Diagnosis not present

## 2020-06-17 DIAGNOSIS — M4316 Spondylolisthesis, lumbar region: Secondary | ICD-10-CM | POA: Diagnosis not present

## 2020-06-17 DIAGNOSIS — Z9181 History of falling: Secondary | ICD-10-CM | POA: Diagnosis not present

## 2020-06-21 DIAGNOSIS — Z9181 History of falling: Secondary | ICD-10-CM | POA: Diagnosis not present

## 2020-06-21 DIAGNOSIS — M4316 Spondylolisthesis, lumbar region: Secondary | ICD-10-CM | POA: Diagnosis not present

## 2020-06-21 NOTE — Progress Notes (Signed)
[facility] Note  06/29/2020     CHIEF COMPLAINT Patient presents for Retina Follow Up   HISTORY OF PRESENT ILLNESS: Walter White. is a 70 y.o. male who presents to the clinic today for:  HPI     Retina Follow Up   Patient presents with  Retinal Break/Detachment.  In left eye.  Duration of 3 weeks.  Since onset it is gradually improving.  I, the attending physician,  performed the HPI with the patient and updated documentation appropriately.        Comments   3 1/2 week follow up Ret Tear OS-  Patient states the vision seems better.  He still has the black specks in vision.  Denies FOLs.  BS can't remember A1C unsure but PCP said it was within normal limits      Last edited by Walter Caffey, MD on 07/01/2020  1:30 AM.    Pt states vision seems to be getting sharper, but he is still seeing "black specks" in his vision, no new fol or shadow in vision   Referring physician: [name], MD [address] [place],  [place] [address]  HISTORICAL INFORMATION:   Selected notes from the MEDICAL RECORD NUMBER Referred by Dr. Wyatt White   CURRENT MEDICATIONS: No current outpatient medications on file. (Ophthalmic Drugs)   No current facility-administered medications for this visit. (Ophthalmic Drugs)   Current Outpatient Medications (Other)  Medication Sig   CELEBREX 200 MG capsule Take 200 mg by mouth 2 (two) times daily.   Cholecalciferol (VITAMIN D) 125 MCG (5000 UT) CAPS Take 5,000 Units by mouth daily.   docusate sodium (COLACE) 100 MG capsule Take 1 capsule (100 mg total) by mouth 2 (two) times daily.   fluticasone (FLONASE) 50 MCG/ACT nasal spray    gabapentin (NEURONTIN) 100 MG capsule Take 100 mg by mouth 2 (two) times daily.   lisinopril-hydrochlorothiazide (PRINZIDE,ZESTORETIC) 20-25 MG tablet Take 1 tablet by mouth daily.   pantoprazole (PROTONIX) 40 MG tablet Take 40 mg by mouth daily.   Psyllium (EQ DAILY FIBER PO)  Take 5 tablets by mouth daily.   rosuvastatin (CRESTOR) 5 MG tablet Take 5 mg by mouth daily.   Semaglutide,0.25 or 0.5MG/DOS, (OZEMPIC, 0.25 OR 0.5 MG/DOSE,) 2 MG/1.5ML SOPN    Tamsulosin HCl (FLOMAX) 0.4 MG CAPS Take 0.4 mg by mouth at bedtime.    traMADol (ULTRAM) 50 MG tablet    HYDROcodone-acetaminophen (NORCO) 7.5-325 MG tablet Take 1 tablet by mouth 4 (four) times daily as needed. (Patient not taking: No sig reported)   No current facility-administered medications for this visit. (Other)      REVIEW OF SYSTEMS: ROS   Positive for: Gastrointestinal, Musculoskeletal, Endocrine, Cardiovascular, Eyes Negative for: Constitutional, Neurological, Skin, Genitourinary, HENT, Respiratory, Psychiatric, Allergic/Imm, Heme/Lymph Last edited by Walter White, COA on 06/29/2020  8:28 AM.        ALLERGIES Allergies  Allergen Reactions   Pork-Derived Products Shortness Of Breath   Shellfish Allergy Shortness Of Breath   Lovastatin Other (See Comments)    Bone pain    Penicillins     UNSPECIFIED CHILDHOOD REACTION  Has patient had a PCN reaction causing immediate rash, facial/tongue/throat swelling, SOB or lightheadedness with hypotension: Unknown Has patient had a PCN reaction causing severe rash involving mucus membranes or skin necrosis: Unknown Has patient had a PCN reaction that required hospitalization: Unknown Has patient had a PCN reaction occurring within the last 10  years: No If all of the above answers are "NO", then may proceed with Cephalosporin use.     PAST MEDICAL HISTORY Past Medical History:  Diagnosis Date   AAA (abdominal aortic aneurysm) (HCC)    4.0 cm AAA 08/22/17 U/S   Allergy    Allergic Rhinitis   Anemia    Arthritis    spinal steniosis   Benign prostate hyperplasia    with lower urinary tract symptoms   Carpal tunnel syndrome on right    Cataracts, both eyes    Chronic back pain    caused by spinal stenosis   Elevated PSA    GERD (gastroesophageal  reflux disease)    Hyperlipidemia    Hypertension    Essential.     MGUS (monoclonal gammopathy of unknown significance)    Microhematuria    OA (osteoarthritis) of knee    Pneumonia    Hx -when very young   Pre-diabetes    Seasonal allergies    Spinal stenosis    of lumbosacral region.  Causes chronic back pain.   Upper abdominal pain    Past Surgical History:  Procedure Laterality Date   ABDOMINAL EXPOSURE N/A 09/05/2017   Procedure: ABDOMINAL EXPOSURE;  Surgeon: Walter Posner, MD;  Location: [facility] OR;  Service: Vascular;  Laterality: N/A;   ANTERIOR LAT LUMBAR FUSION Left 01/26/2016   Procedure: LEFT SIDED LATERAL INTERBODY FUSION, LUMBAR 3-4 WITH INSTRUMENTATION AND ALLOGRAFT;  Surgeon: Walter Bob, MD;  Location: [facility];  Service: Orthopedics;  Laterality: Left;  LEFT SIDED LATERAL INTERBODY FUSION, LUMBAR 3-4 WITH INSTRUMENTATION AND ALLOGRAFT   ANTERIOR LAT LUMBAR FUSION Right 08/14/2018   Procedure: RIGHT LUMBAR two to LUMBAR three LATERAL INTERBODY FUSION WITH INSTRUMENTATION AND ALLOGRAFT;  Surgeon: Walter Bob, MD;  Location: [facility];  Service: Orthopedics;  Laterality: Right;   ANTERIOR LATERAL LUMBAR FUSION WITH PERCUTANEOUS SCREW 1 LEVEL N/A 09/05/2017   Procedure: LUMBAR 5 - SACRUM 1 ANTERIOR LUMBAR INTERBODY FUSION WITH INSTRUMENTATION AND ALLOGRAFT;  Surgeon: Walter Bob, MD;  Location: [facility];  Service: Orthopedics;  Laterality: N/A;   BACK SURGERY  01/2015   dr. Lynann White. [phone].   CARPAL TUNNEL RELEASE     right    CATARACT EXTRACTION, BILATERAL     COLONOSCOPY     COLONOSCOPY  2010,2016   polyp   ELBOW SURGERY     right ulnar release   ELBOW SURGERY     LEFT CTR   EYE SURGERY     JOINT REPLACEMENT     left- Sept 2016   KNEE ARTHROSCOPY  11/23/2010   Procedure: ARTHROSCOPY KNEE;  Surgeon: Walter White;  Location: [facility];  Service: Orthopedics;  Laterality: Left;  arthroscopy left knee   KNEE ARTHROSCOPY     left x 2   Knee scar tissue  removal Left 2018   LATERAL FUSION LUMBAR SPINE  01./17/2018   polypectomy  2010   SHOULDER ARTHROSCOPY Left    x2   TONSILLECTOMY     TOTAL KNEE ARTHROPLASTY Left 09/21/2014   Procedure: TOTAL KNEE ARTHROPLASTY;  Surgeon: Walter Pear, MD;  Location: [facility];  Service: Orthopedics;  Laterality: Left;   ULNAR NERVE TRANSPOSITION Left 04/16/2017   Procedure: LEFT ULNAR NEUROPLASTY AT THE WRIST AND TRANSPOSITION AT THE ELBOW;  Surgeon: Milly Jakob, MD;  Location: [facility];  Service: Orthopedics;  Laterality: Left;   WISDOM TOOTH EXTRACTION      FAMILY HISTORY Family History  Problem Relation Age  of Onset   Cancer Mother    Liver disease Father    Colon cancer Maternal Uncle 12   Colon polyps Neg Hx    Esophageal cancer Neg Hx    Rectal cancer Neg Hx    Stomach cancer Neg Hx     SOCIAL HISTORY Social History   Tobacco Use   Smoking status: Former    Pack years: 0.00    Types: Cigarettes    Quit date: 11/21/1980    Years since quitting: 39.6   Smokeless tobacco: Never  Vaping Use   Vaping Use: Never used  Substance Use Topics   Alcohol use: Yes    Alcohol/week: 0.0 standard drinks    Comment: Very rare   Drug use: No         OPHTHALMIC EXAM:  Base Eye Exam     Visual Acuity (Snellen - Linear)       Right Left   Dist [place] 20/20 20/25   Dist ph [place]  NI         Tonometry (Tonopen, 8:32 AM)       Right Left   Pressure 10 6         Pupils       Dark Light Shape React APD   Right 2 1 Round Brisk None   Left 2 1 Round Brisk None         Visual Fields (Counting fingers)       Left Right    Full Full         Extraocular Movement       Right Left    Full Full         Neuro/Psych     Oriented x3: Yes   Mood/Affect: Normal         Dilation     Both eyes: 1.0% Mydriacyl, 2.5% Phenylephrine @ 8:32 AM           Slit Lamp and Fundus Exam     Slit Lamp Exam       Right Left   Lids/Lashes Dermatochalasis - upper  lid Dermatochalasis - upper lid   Conjunctiva/Sclera White and quiet White and quiet   Cornea arcus, well healed cataract wound arcus, well healed cataract wound   Anterior Chamber Deep and quiet Deep and quiet   Iris Round and moderately dilated Round and moderately dilated   Lens PC IOL in good position PC IOL in good position   Vitreous Vitreous syneresis Vitreous syneresis, Posterior vitreous detachment, blood stained vitreous condensations and debris settled inferiorly         Fundus Exam       Right Left   Disc Pink and Sharp 2-3+Pallor, Sharp rim   C/D Ratio 0.2 0.5   Macula Flat, Blunted foveal reflex, RPE mottling, No heme or edema Flat, hazy view - improved, Blunted foveal reflex, mild RPE mottling, No heme or edema   Vessels mild attenuation, mild tortuousity attenuated, mild tortuousity   Periphery Attached    Attached, horseshoe tear at 0230 -- good laser and cryo changes surrounding, mild pre-retinal heme settled inferiorly, no new RT/RD            IMAGING AND PROCEDURES  Imaging and Procedures for 06/29/2020  OCT, Retina - OU - Both Eyes       Right Eye Quality was good. Central Foveal Thickness: 278. Progression has been stable. Findings include normal foveal contour, no IRF, no SRF (Partial PVD).   Left Eye  Quality was good. Central Foveal Thickness: 286. Progression has improved. Findings include normal foveal contour, no IRF, no SRF, retinal drusen (Interval improvement in vitreous opacities).   Notes *Images captured and stored on drive  Diagnosis / Impression:  NFP; no IRF/SRF OU OS: Interval improvement in vitreous opacities  Clinical management:  See below  Abbreviations: NFP - Normal foveal profile. CME - cystoid macular edema. PED - pigment epithelial detachment. IRF - intraretinal fluid. SRF - subretinal fluid. EZ - ellipsoid zone. ERM - epiretinal membrane. ORA - outer retinal atrophy. ORT - outer retinal tubulation. SRHM - subretinal  hyper-reflective material. IRHM - intraretinal hyper-reflective material               ASSESSMENT/PLAN:    ICD-10-CM   1. Retinal tear of left eye  H33.312     2. Retinal edema  H35.81 OCT, Retina - OU - Both Eyes    3. Vitreous hemorrhage of left eye (HCC)  H43.12     4. Essential hypertension  I10     5. Hypertensive retinopathy of both eyes  H35.033     6. Pseudophakia, both eyes  Z96.1      1-3. Retinal tear w/ vitreous hemorrhage, OS  - Acute PVD on Monday, 4.25.22, w/ symptomatic floaters  - was [facility] by [facility] 4.27.22 -- HST noted OS - horseshoe tear located at 0230 with punctate heme surrounding - s/p laser retinopexy + cryopexy OS (04.27.22) -- good laser and cryo scarring in place - no new RT/RD - significant vitreous debris and blood-stained vitreous condensations -- improving - VH precautions reviewed -- minimize activities, keep head elevated, avoid ASA/NSAIDs/blood thinners as able - f/u in 4 wks, sooner prn - DFE, OCT  3,4. Hypertensive retinopathy OU - discussed importance of tight BP control - monitor   5. Pseudophakia OU  - s/p CE/IOL  - IOL in good position, doing well  - monitor  Ophthalmic Meds Ordered this visit:  No orders of the defined types were placed in this encounter.     Return in about 4 weeks (around 07/27/2020) for RT w/ VH OS s/p cryo, Dilated Exam, OCT.  There are no Patient Instructions on file for this visit.  Explained the diagnoses, plan, and follow up with the patient and they expressed understanding.  Patient expressed understanding of the importance of proper follow up care.   This document serves as a record of services personally performed by Gardiner Sleeper, MD, PhD. It was created on their behalf by Estill Bakes, COT an ophthalmic technician. The creation of this record is the provider's dictation and/or activities during the visit.    Electronically signed by: Estill Bakes, COT 6.13.22 @ 1:35 AM   This  document serves as a record of services personally performed by Gardiner Sleeper, MD, PhD. It was created on their behalf by San Jetty. Owens Shark, OA an ophthalmic technician. The creation of this record is the provider's dictation and/or activities during the visit.    Electronically signed by: San Jetty. Owens Shark, [place] 06.21.2022 1:35 AM   Gardiner Sleeper, M.D., Ph.D. Diseases & Surgery of the Retina and Vitreous [facility]  I have reviewed the above documentation for accuracy and completeness, and I agree with the above. Gardiner Sleeper, M.D., Ph.D. 07/01/20 1:35 AM  Abbreviations: M myopia (nearsighted); A astigmatism; H hyperopia (farsighted); P presbyopia; Mrx spectacle prescription;  CTL contact lenses; OD right eye; OS left eye; OU both  eyes  XT exotropia; ET esotropia; PEK punctate epithelial keratitis; PEE punctate epithelial erosions; DES dry eye syndrome; MGD meibomian gland dysfunction; ATs artificial tears; PFAT's preservative free artificial tears; [place] nuclear sclerotic cataract; PSC posterior subcapsular cataract; ERM epi-retinal membrane; PVD posterior vitreous detachment; RD retinal detachment; DM diabetes mellitus; DR diabetic retinopathy; NPDR non-proliferative diabetic retinopathy; PDR proliferative diabetic retinopathy; CSME clinically significant macular edema; DME diabetic macular edema; dbh dot blot hemorrhages; CWS cotton wool spot; POAG primary open angle glaucoma; C/D cup-to-disc ratio; HVF humphrey visual field; GVF goldmann visual field; OCT optical coherence tomography; IOP intraocular pressure; BRVO Branch retinal vein occlusion; CRVO central retinal vein occlusion; CRAO central retinal artery occlusion; BRAO branch retinal artery occlusion; RT retinal tear; SB scleral buckle; PPV pars plana vitrectomy; VH Vitreous hemorrhage; PRP panretinal laser photocoagulation; IVK intravitreal kenalog; VMT vitreomacular traction; MH Macular hole;  NVD neovascularization of  the disc; NVE neovascularization elsewhere; AREDS age related eye disease study; ARMD age related macular degeneration; POAG primary open angle glaucoma; EBMD epithelial/anterior basement membrane dystrophy; ACIOL anterior chamber intraocular lens; IOL intraocular lens; PCIOL posterior chamber intraocular lens; Phaco/IOL phacoemulsification with intraocular lens placement; [place] photorefractive keratectomy; LASIK laser assisted in situ keratomileusis; HTN hypertension; DM diabetes mellitus; COPD chronic obstructive pulmonary disease

## 2020-06-24 DIAGNOSIS — M4316 Spondylolisthesis, lumbar region: Secondary | ICD-10-CM | POA: Diagnosis not present

## 2020-06-24 DIAGNOSIS — Z9181 History of falling: Secondary | ICD-10-CM | POA: Diagnosis not present

## 2020-06-25 ENCOUNTER — Encounter (INDEPENDENT_AMBULATORY_CARE_PROVIDER_SITE_OTHER): Payer: Medicare Other | Admitting: Ophthalmology

## 2020-06-25 DIAGNOSIS — M19042 Primary osteoarthritis, left hand: Secondary | ICD-10-CM | POA: Diagnosis not present

## 2020-06-25 DIAGNOSIS — M19041 Primary osteoarthritis, right hand: Secondary | ICD-10-CM | POA: Diagnosis not present

## 2020-06-28 DIAGNOSIS — M4316 Spondylolisthesis, lumbar region: Secondary | ICD-10-CM | POA: Diagnosis not present

## 2020-06-28 DIAGNOSIS — Z9181 History of falling: Secondary | ICD-10-CM | POA: Diagnosis not present

## 2020-06-29 ENCOUNTER — Ambulatory Visit (INDEPENDENT_AMBULATORY_CARE_PROVIDER_SITE_OTHER): Payer: Medicare Other | Admitting: Ophthalmology

## 2020-06-29 ENCOUNTER — Other Ambulatory Visit: Payer: Self-pay

## 2020-06-29 DIAGNOSIS — Z961 Presence of intraocular lens: Secondary | ICD-10-CM

## 2020-06-29 DIAGNOSIS — H4312 Vitreous hemorrhage, left eye: Secondary | ICD-10-CM | POA: Diagnosis not present

## 2020-06-29 DIAGNOSIS — H33312 Horseshoe tear of retina without detachment, left eye: Secondary | ICD-10-CM

## 2020-06-29 DIAGNOSIS — H3581 Retinal edema: Secondary | ICD-10-CM

## 2020-06-29 DIAGNOSIS — H35033 Hypertensive retinopathy, bilateral: Secondary | ICD-10-CM | POA: Diagnosis not present

## 2020-06-29 DIAGNOSIS — I10 Essential (primary) hypertension: Secondary | ICD-10-CM

## 2020-06-30 DIAGNOSIS — M19041 Primary osteoarthritis, right hand: Secondary | ICD-10-CM | POA: Diagnosis not present

## 2020-06-30 DIAGNOSIS — M19042 Primary osteoarthritis, left hand: Secondary | ICD-10-CM | POA: Diagnosis not present

## 2020-07-01 ENCOUNTER — Encounter (INDEPENDENT_AMBULATORY_CARE_PROVIDER_SITE_OTHER): Payer: Self-pay | Admitting: Ophthalmology

## 2020-07-01 DIAGNOSIS — Z9181 History of falling: Secondary | ICD-10-CM | POA: Diagnosis not present

## 2020-07-01 DIAGNOSIS — M4316 Spondylolisthesis, lumbar region: Secondary | ICD-10-CM | POA: Diagnosis not present

## 2020-07-05 DIAGNOSIS — Z9181 History of falling: Secondary | ICD-10-CM | POA: Diagnosis not present

## 2020-07-05 DIAGNOSIS — Z85828 Personal history of other malignant neoplasm of skin: Secondary | ICD-10-CM | POA: Diagnosis not present

## 2020-07-05 DIAGNOSIS — D225 Melanocytic nevi of trunk: Secondary | ICD-10-CM | POA: Diagnosis not present

## 2020-07-05 DIAGNOSIS — S20462A Insect bite (nonvenomous) of left back wall of thorax, initial encounter: Secondary | ICD-10-CM | POA: Diagnosis not present

## 2020-07-05 DIAGNOSIS — Z08 Encounter for follow-up examination after completed treatment for malignant neoplasm: Secondary | ICD-10-CM | POA: Diagnosis not present

## 2020-07-05 DIAGNOSIS — Z1283 Encounter for screening for malignant neoplasm of skin: Secondary | ICD-10-CM | POA: Diagnosis not present

## 2020-07-05 DIAGNOSIS — M4316 Spondylolisthesis, lumbar region: Secondary | ICD-10-CM | POA: Diagnosis not present

## 2020-07-07 DIAGNOSIS — Z9181 History of falling: Secondary | ICD-10-CM | POA: Diagnosis not present

## 2020-07-07 DIAGNOSIS — M19042 Primary osteoarthritis, left hand: Secondary | ICD-10-CM | POA: Diagnosis not present

## 2020-07-07 DIAGNOSIS — M19041 Primary osteoarthritis, right hand: Secondary | ICD-10-CM | POA: Diagnosis not present

## 2020-07-07 DIAGNOSIS — M4316 Spondylolisthesis, lumbar region: Secondary | ICD-10-CM | POA: Diagnosis not present

## 2020-07-13 DIAGNOSIS — Z9181 History of falling: Secondary | ICD-10-CM | POA: Diagnosis not present

## 2020-07-13 DIAGNOSIS — M4316 Spondylolisthesis, lumbar region: Secondary | ICD-10-CM | POA: Diagnosis not present

## 2020-07-14 DIAGNOSIS — M19041 Primary osteoarthritis, right hand: Secondary | ICD-10-CM | POA: Diagnosis not present

## 2020-07-14 DIAGNOSIS — M19042 Primary osteoarthritis, left hand: Secondary | ICD-10-CM | POA: Diagnosis not present

## 2020-07-16 DIAGNOSIS — M4316 Spondylolisthesis, lumbar region: Secondary | ICD-10-CM | POA: Diagnosis not present

## 2020-07-16 DIAGNOSIS — Z9181 History of falling: Secondary | ICD-10-CM | POA: Diagnosis not present

## 2020-07-22 DIAGNOSIS — M19041 Primary osteoarthritis, right hand: Secondary | ICD-10-CM | POA: Diagnosis not present

## 2020-07-22 DIAGNOSIS — M19042 Primary osteoarthritis, left hand: Secondary | ICD-10-CM | POA: Diagnosis not present

## 2020-07-22 DIAGNOSIS — R972 Elevated prostate specific antigen [PSA]: Secondary | ICD-10-CM | POA: Diagnosis not present

## 2020-07-27 ENCOUNTER — Encounter: Payer: Self-pay | Admitting: Oncology

## 2020-07-27 DIAGNOSIS — M19042 Primary osteoarthritis, left hand: Secondary | ICD-10-CM | POA: Diagnosis not present

## 2020-07-27 DIAGNOSIS — M1711 Unilateral primary osteoarthritis, right knee: Secondary | ICD-10-CM | POA: Diagnosis not present

## 2020-07-27 DIAGNOSIS — M19041 Primary osteoarthritis, right hand: Secondary | ICD-10-CM | POA: Diagnosis not present

## 2020-07-27 DIAGNOSIS — M1712 Unilateral primary osteoarthritis, left knee: Secondary | ICD-10-CM | POA: Diagnosis not present

## 2020-07-28 ENCOUNTER — Inpatient Hospital Stay: Payer: Medicare Other

## 2020-07-28 ENCOUNTER — Telehealth: Payer: Self-pay | Admitting: Oncology

## 2020-07-28 NOTE — Telephone Encounter (Signed)
Scheduled appointment per 07/20 sch msg. Patient is aware.

## 2020-07-28 NOTE — Progress Notes (Signed)
[facility] Note  07/29/2020     CHIEF COMPLAINT Patient presents for Retina Follow Up   HISTORY OF PRESENT ILLNESS: Walter White. is a 70 y.o. male who presents to the clinic today for:  HPI     Retina Follow Up   Patient presents with  Other.  In left eye.  This started 4 weeks ago.  I, the attending physician,  performed the HPI with the patient and updated documentation appropriately.        Comments   Patient here for 4 weeks retina follow up for RT with [facility] OS repair 05-05-20. Patient states vision getting better. Still a little bit of flakiness. Still sees flashes of light temporally at night. No eye pain. Has a little discomfort.      Last edited by [name], MD on 07/29/2020 10:56 AM.     Pt states floaters seem to be getting better, but he has started to notice fol mostly at night, he has had a little bit of discomfort, but not pain   Referring physician: [name], MD [address] [place],  [place] [address]  HISTORICAL INFORMATION:   Selected notes from the MEDICAL RECORD NUMBER Referred by Dr. [name]   CURRENT MEDICATIONS: No current outpatient medications on file. (Ophthalmic Drugs)   No current facility-administered medications for this visit. (Ophthalmic Drugs)   Current Outpatient Medications (Other)  Medication Sig   CELEBREX 200 MG capsule Take 200 mg by mouth 2 (two) times daily.   Cholecalciferol (VITAMIN D) 125 MCG (5000 UT) CAPS Take 5,000 Units by mouth daily.   docusate sodium (COLACE) 100 MG capsule Take 1 capsule (100 mg total) by mouth 2 (two) times daily.   fluticasone (FLONASE) 50 MCG/ACT nasal spray    gabapentin (NEURONTIN) 100 MG capsule Take 100 mg by mouth 2 (two) times daily.   HYDROcodone-acetaminophen (NORCO) 7.5-325 MG tablet Take 1 tablet by mouth 4 (four) times daily as needed. (Patient not taking: No sig reported)   lisinopril-hydrochlorothiazide (PRINZIDE,ZESTORETIC)  20-25 MG tablet Take 1 tablet by mouth daily.   pantoprazole (PROTONIX) 40 MG tablet Take 40 mg by mouth daily.   Psyllium (EQ DAILY FIBER PO) Take 5 tablets by mouth daily.   rosuvastatin (CRESTOR) 5 MG tablet Take 5 mg by mouth daily.   Semaglutide,0.25 or 0.5MG /DOS, (OZEMPIC, 0.25 OR 0.5 MG/DOSE,) 2 MG/1.5ML SOPN    Tamsulosin HCl (FLOMAX) 0.4 MG CAPS Take 0.4 mg by mouth at bedtime.    traMADol (ULTRAM) 50 MG tablet    No current facility-administered medications for this visit. (Other)      REVIEW OF SYSTEMS: ROS   Positive for: Gastrointestinal, Musculoskeletal, Endocrine, Cardiovascular, Eyes Negative for: Constitutional, Neurological, Skin, Genitourinary, HENT, Respiratory, Psychiatric, Allergic/Imm, Heme/Lymph Last edited by Theodore Demark, COA on 07/29/2020  9:01 AM.         ALLERGIES Allergies  Allergen Reactions   Pork-Derived Products Shortness Of Breath   Shellfish Allergy Shortness Of Breath   Lovastatin Other (See Comments)    Bone pain    Penicillins     UNSPECIFIED CHILDHOOD REACTION  Has patient had a PCN reaction causing immediate rash, facial/tongue/throat swelling, SOB or lightheadedness with hypotension: Unknown Has patient had a PCN reaction causing severe rash involving mucus membranes or skin necrosis: Unknown Has patient had a PCN reaction that required hospitalization: Unknown Has patient had a PCN reaction occurring within the last 10 years:  No If all of the above answers are "NO", then may proceed with Cephalosporin use.     PAST MEDICAL HISTORY Past Medical History:  Diagnosis Date   AAA (abdominal aortic aneurysm) (HCC)    4.0 cm AAA 08/22/17 U/S   Allergy    Allergic Rhinitis   Anemia    Arthritis    spinal steniosis   Benign prostate hyperplasia    with lower urinary tract symptoms   Carpal tunnel syndrome on right    Cataracts, both eyes    Chronic back pain    caused by spinal stenosis   Elevated PSA    GERD  (gastroesophageal reflux disease)    Hyperlipidemia    Hypertension    Essential.     MGUS (monoclonal gammopathy of unknown significance)    Microhematuria    OA (osteoarthritis) of knee    Pneumonia    Hx -when very young   Pre-diabetes    Seasonal allergies    Spinal stenosis    of lumbosacral region.  Causes chronic back pain.   Upper abdominal pain    Past Surgical History:  Procedure Laterality Date   ABDOMINAL EXPOSURE N/A 09/05/2017   Procedure: ABDOMINAL EXPOSURE;  Surgeon: Rosetta Posner, MD;  Location: [facility] OR;  Service: Vascular;  Laterality: N/A;   ANTERIOR LAT LUMBAR FUSION Left 01/26/2016   Procedure: LEFT SIDED LATERAL INTERBODY FUSION, LUMBAR 3-4 WITH INSTRUMENTATION AND ALLOGRAFT;  Surgeon: Phylliss Bob, MD;  Location: [place];  Service: Orthopedics;  Laterality: Left;  LEFT SIDED LATERAL INTERBODY FUSION, LUMBAR 3-4 WITH INSTRUMENTATION AND ALLOGRAFT   ANTERIOR LAT LUMBAR FUSION Right 08/14/2018   Procedure: RIGHT LUMBAR two to LUMBAR three LATERAL INTERBODY FUSION WITH INSTRUMENTATION AND ALLOGRAFT;  Surgeon: Phylliss Bob, MD;  Location: [place];  Service: Orthopedics;  Laterality: Right;   ANTERIOR LATERAL LUMBAR FUSION WITH PERCUTANEOUS SCREW 1 LEVEL N/A 09/05/2017   Procedure: LUMBAR 5 - SACRUM 1 ANTERIOR LUMBAR INTERBODY FUSION WITH INSTRUMENTATION AND ALLOGRAFT;  Surgeon: Phylliss Bob, MD;  Location: [place];  Service: Orthopedics;  Laterality: N/A;   BACK SURGERY  01/2015   dr. Lynann Bologna. [phone].   CARPAL TUNNEL RELEASE     right    CATARACT EXTRACTION, BILATERAL     COLONOSCOPY     COLONOSCOPY  2010,2016   polyp   ELBOW SURGERY     right ulnar release   ELBOW SURGERY     LEFT CTR   EYE SURGERY     JOINT REPLACEMENT     left- Sept 2016   KNEE ARTHROSCOPY  11/23/2010   Procedure: ARTHROSCOPY KNEE;  Surgeon: Kerin Salen;  Location: [facility];  Service: Orthopedics;  Laterality: Left;  arthroscopy left knee   KNEE ARTHROSCOPY     left x 2    Knee scar tissue removal Left 2018   LATERAL FUSION LUMBAR SPINE  01./17/2018   polypectomy  2010   SHOULDER ARTHROSCOPY Left    x2   TONSILLECTOMY     TOTAL KNEE ARTHROPLASTY Left 09/21/2014   Procedure: TOTAL KNEE ARTHROPLASTY;  Surgeon: Frederik Pear, MD;  Location: [place];  Service: Orthopedics;  Laterality: Left;   ULNAR NERVE TRANSPOSITION Left 04/16/2017   Procedure: LEFT ULNAR NEUROPLASTY AT THE WRIST AND TRANSPOSITION AT THE ELBOW;  Surgeon: Milly Jakob, MD;  Location: [place];  Service: Orthopedics;  Laterality: Left;   WISDOM TOOTH EXTRACTION      FAMILY HISTORY Family History  Problem Relation Age of  Onset   Cancer Mother    Liver disease Father    Colon cancer Maternal Uncle 73   Colon polyps Neg Hx    Esophageal cancer Neg Hx    Rectal cancer Neg Hx    Stomach cancer Neg Hx     SOCIAL HISTORY Social History   Tobacco Use   Smoking status: Former    Types: Cigarettes    Quit date: 11/21/1980    Years since quitting: 39.7   Smokeless tobacco: Never  Vaping Use   Vaping Use: Never used  Substance Use Topics   Alcohol use: Yes    Alcohol/week: 0.0 standard drinks    Comment: Very rare   Drug use: No         OPHTHALMIC EXAM:  Base Eye Exam     Visual Acuity (Snellen - Linear)       Right Left   Dist  20/20 -1 20/25         Tonometry (Tonopen, 8:57 AM)       Right Left   Pressure 13 11         Pupils       Dark Light Shape React APD   Right 2 1 Round Brisk None   Left 2 1 Round Brisk None         Visual Fields (Counting fingers)       Left Right    Full Full         Extraocular Movement       Right Left    Full Full         Neuro/Psych     Oriented x3: Yes   Mood/Affect: Normal         Dilation     Both eyes: 1.0% Mydriacyl, 2.5% Phenylephrine @ 8:57 AM           Slit Lamp and Fundus Exam     Slit Lamp Exam       Right Left   Lids/Lashes Dermatochalasis - upper lid  Dermatochalasis - upper lid   Conjunctiva/Sclera White and quiet White and quiet   Cornea arcus, well healed cataract wound arcus, well healed cataract wound   Anterior Chamber Deep and quiet Deep and quiet   Iris Round and moderately dilated Round and moderately dilated   Lens PC IOL in good position PC IOL in good position   Vitreous Vitreous syneresis Vitreous syneresis, Posterior vitreous detachment, blood stained vitreous condensations and debris clearing centrally and settling inferiorly, prominent condensation overlying disc         Fundus Exam       Right Left   Disc Pink and Sharp 2-3+Pallor, Sharp rim   C/D Ratio 0.2 0.5   Macula Flat, Blunted foveal reflex, RPE mottling, No heme or edema Flat, Blunted foveal reflex, mild RPE mottling, No heme or edema   Vessels mild attenuation, mild tortuousity attenuated, mild tortuousity   Periphery Attached    Attached, horseshoe tear at 0230 -- sealed w/ good laser and cryo changes, mild pre-retinal heme settled inferiorly, no new RT/RD            IMAGING AND PROCEDURES  Imaging and Procedures for 07/29/2020  OCT, Retina - OU - Both Eyes       Right Eye Quality was good. Central Foveal Thickness: 283. Progression has been stable. Findings include normal foveal contour, no IRF, no SRF (Partial PVD).   Left Eye Quality was good. Central Foveal Thickness: 287. Progression has  improved. Findings include normal foveal contour, no IRF, no SRF, retinal drusen (Interval improvement in vitreous opacities clearing centrally and settling inferiorly).   Notes *Images captured and stored on drive  Diagnosis / Impression:  NFP; no IRF/SRF OU OS: Interval improvement in vitreous opacities - clearing centrally and settling inferiorly   Clinical management:  See below  Abbreviations: NFP - Normal foveal profile. CME - cystoid macular edema. PED - pigment epithelial detachment. IRF - intraretinal fluid. SRF - subretinal fluid. EZ -  ellipsoid zone. ERM - epiretinal membrane. ORA - outer retinal atrophy. ORT - outer retinal tubulation. SRHM - subretinal hyper-reflective material. IRHM - intraretinal hyper-reflective material                ASSESSMENT/PLAN:    ICD-10-CM   1. Retinal tear of left eye  H33.312     2. Retinal edema  H35.81 OCT, Retina - OU - Both Eyes    3. Vitreous hemorrhage of left eye (HCC)  H43.12     4. Essential hypertension  I10     5. Hypertensive retinopathy of both eyes  H35.033     6. Pseudophakia, both eyes  Z96.1       1-3. Retinal tear w/ vitreous hemorrhage, OS  - Acute PVD on Monday, 04.25.22, w/ symptomatic floaters  - was seen by [facility] 04.27.22 -- HST noted OS - horseshoe tear located at 0230 with punctate heme surrounding - s/p laser retinopexy + cryopexy OS (04.27.22) -- good laser and cryo scarring in place - HST sealed - no new RT/RD - significant vitreous debris and blood-stained vitreous condensations -- improving very slowly - VH precautions reviewed -- minimize activities, keep head elevated, avoid ASA/NSAIDs/blood thinners as able - f/u in 4-6 wks, sooner prn - DFE, OCT  3,4. Hypertensive retinopathy OU - discussed importance of tight BP control - monitor   5. Pseudophakia OU  - s/p CE/IOL OU  - IOLs in good position, doing well  - monitor   Ophthalmic Meds Ordered this visit:  No orders of the defined types were placed in this encounter.     Return for f/u 4-6 weeks, retinal tear and VH OS, DFE, OCT.  There are no Patient Instructions on file for this visit.  Explained the diagnoses, plan, and follow up with the patient and they expressed understanding.  Patient expressed understanding of the importance of proper follow up care.   This document serves as a record of services personally performed by Gardiner Sleeper, MD, PhD. It was created on their behalf by [name], an ophthalmic technician. The creation of this record is the  provider's dictation and/or activities during the visit.    Electronically signed by: [name] COA, 07/29/20  10:59 AM  Gardiner Sleeper, M.D., Ph.D. Diseases & Surgery of the Retina and [place] 07/29/2020  I have reviewed the above documentation for accuracy and completeness, and I agree with the above. Gardiner Sleeper, M.D., Ph.D. 07/29/20 10:59 AM   Abbreviations: M myopia (nearsighted); A astigmatism; H hyperopia (farsighted); P presbyopia; Mrx spectacle prescription;  CTL contact lenses; OD right eye; OS left eye; OU both eyes  XT exotropia; ET esotropia; PEK punctate epithelial keratitis; PEE punctate epithelial erosions; DES dry eye syndrome; MGD meibomian gland dysfunction; ATs artificial tears; PFAT's preservative free artificial tears; [place] nuclear sclerotic cataract; PSC posterior subcapsular cataract; ERM epi-retinal membrane; PVD posterior vitreous detachment; RD retinal detachment; DM diabetes mellitus; DR  diabetic retinopathy; NPDR non-proliferative diabetic retinopathy; PDR proliferative diabetic retinopathy; CSME clinically significant macular edema; DME diabetic macular edema; dbh dot blot hemorrhages; CWS cotton wool spot; POAG primary open angle glaucoma; C/D cup-to-disc ratio; HVF humphrey visual field; GVF goldmann visual field; OCT optical coherence tomography; IOP intraocular pressure; BRVO Branch retinal vein occlusion; CRVO central retinal vein occlusion; CRAO central retinal artery occlusion; BRAO branch retinal artery occlusion; RT retinal tear; SB scleral buckle; PPV pars plana vitrectomy; VH Vitreous hemorrhage; PRP panretinal laser photocoagulation; IVK intravitreal kenalog; VMT vitreomacular traction; MH Macular hole;  NVD neovascularization of the disc; NVE neovascularization elsewhere; AREDS age related eye disease study; ARMD age related macular degeneration; POAG primary open angle glaucoma; EBMD epithelial/anterior basement  membrane dystrophy; ACIOL anterior chamber intraocular lens; IOL intraocular lens; PCIOL posterior chamber intraocular lens; Phaco/IOL phacoemulsification with intraocular lens placement; [place] photorefractive keratectomy; LASIK laser assisted in situ keratomileusis; HTN hypertension; DM diabetes mellitus; COPD chronic obstructive pulmonary disease

## 2020-07-29 ENCOUNTER — Other Ambulatory Visit: Payer: Self-pay

## 2020-07-29 ENCOUNTER — Encounter (INDEPENDENT_AMBULATORY_CARE_PROVIDER_SITE_OTHER): Payer: Self-pay | Admitting: Ophthalmology

## 2020-07-29 ENCOUNTER — Ambulatory Visit (INDEPENDENT_AMBULATORY_CARE_PROVIDER_SITE_OTHER): Payer: Medicare Other | Admitting: Ophthalmology

## 2020-07-29 DIAGNOSIS — H35033 Hypertensive retinopathy, bilateral: Secondary | ICD-10-CM | POA: Diagnosis not present

## 2020-07-29 DIAGNOSIS — I10 Essential (primary) hypertension: Secondary | ICD-10-CM | POA: Diagnosis not present

## 2020-07-29 DIAGNOSIS — H3581 Retinal edema: Secondary | ICD-10-CM | POA: Diagnosis not present

## 2020-07-29 DIAGNOSIS — Z961 Presence of intraocular lens: Secondary | ICD-10-CM | POA: Diagnosis not present

## 2020-07-29 DIAGNOSIS — H33312 Horseshoe tear of retina without detachment, left eye: Secondary | ICD-10-CM

## 2020-07-29 DIAGNOSIS — H4312 Vitreous hemorrhage, left eye: Secondary | ICD-10-CM | POA: Diagnosis not present

## 2020-07-30 ENCOUNTER — Inpatient Hospital Stay: Payer: Medicare Other | Attending: Oncology

## 2020-07-30 DIAGNOSIS — Z79899 Other long term (current) drug therapy: Secondary | ICD-10-CM | POA: Insufficient documentation

## 2020-07-30 DIAGNOSIS — D472 Monoclonal gammopathy: Secondary | ICD-10-CM | POA: Insufficient documentation

## 2020-07-30 DIAGNOSIS — D649 Anemia, unspecified: Secondary | ICD-10-CM | POA: Insufficient documentation

## 2020-07-30 LAB — CMP (CANCER CENTER ONLY)
ALT: 27 U/L (ref 0–44)
AST: 34 U/L (ref 15–41)
Albumin: 4.2 g/dL (ref 3.5–5.0)
Alkaline Phosphatase: 63 U/L (ref 38–126)
Anion gap: 8 (ref 5–15)
BUN: 27 mg/dL — ABNORMAL HIGH (ref 8–23)
CO2: 27 mmol/L (ref 22–32)
Calcium: 9.6 mg/dL (ref 8.9–10.3)
Chloride: 102 mmol/L (ref 98–111)
Creatinine: 0.89 mg/dL (ref 0.61–1.24)
GFR, Estimated: >60 mL/min (ref >60)
Glucose, Bld: 182 mg/dL — ABNORMAL HIGH (ref 70–99)
Potassium: 3.8 mmol/L (ref 3.5–5.1)
Sodium: 137 mmol/L (ref 135–145)
Total Bilirubin: 0.6 mg/dL (ref 0.3–1.2)
Total Protein: 8.5 g/dL — ABNORMAL HIGH (ref 6.5–8.1)

## 2020-07-30 LAB — CBC WITH DIFFERENTIAL/PLATELET
Abs Immature Granulocytes: 0.02 10*3/uL (ref 0.00–0.07)
Basophils Absolute: 0 10*3/uL (ref 0.0–0.1)
Basophils Relative: 0 %
Eosinophils Absolute: 0 10*3/uL (ref 0.0–0.5)
Eosinophils Relative: 1 %
HCT: 32.1 % — ABNORMAL LOW (ref 39.0–52.0)
Hemoglobin: 11.1 g/dL — ABNORMAL LOW (ref 13.0–17.0)
Immature Granulocytes: 0 %
Lymphocytes Relative: 22 %
Lymphs Abs: 1.2 10*3/uL (ref 0.7–4.0)
MCH: 31.2 pg (ref 26.0–34.0)
MCHC: 34.6 g/dL (ref 30.0–36.0)
MCV: 90.2 fL (ref 80.0–100.0)
Monocytes Absolute: 0.5 10*3/uL (ref 0.1–1.0)
Monocytes Relative: 9 %
Neutro Abs: 3.5 10*3/uL (ref 1.7–7.7)
Neutrophils Relative %: 68 %
Platelets: 148 10*3/uL — ABNORMAL LOW (ref 150–400)
RBC: 3.56 MIL/uL — ABNORMAL LOW (ref 4.22–5.81)
RDW: 14.3 % (ref 11.5–15.5)
WBC: 5.2 10*3/uL (ref 4.0–10.5)
nRBC: 0 % (ref 0.0–0.2)

## 2020-07-30 LAB — PROTEIN / CREATININE RATIO, URINE
Creatinine, Urine: 95.6 mg/dL
Protein Creatinine Ratio: 0.06 mg/mg{Cre} (ref 0.00–0.15)
Total Protein, Urine: 6 mg/dL

## 2020-08-02 LAB — KAPPA/LAMBDA LIGHT CHAINS
Kappa free light chain: 46.5 mg/L — ABNORMAL HIGH (ref 3.3–19.4)
Kappa, lambda light chain ratio: 4.65 — ABNORMAL HIGH (ref 0.26–1.65)
Lambda free light chains: 10 mg/L (ref 5.7–26.3)

## 2020-08-03 DIAGNOSIS — M19041 Primary osteoarthritis, right hand: Secondary | ICD-10-CM | POA: Diagnosis not present

## 2020-08-03 DIAGNOSIS — M19042 Primary osteoarthritis, left hand: Secondary | ICD-10-CM | POA: Diagnosis not present

## 2020-08-03 LAB — MULTIPLE MYELOMA PANEL, SERUM
Albumin SerPl Elph-Mcnc: 3.8 g/dL (ref 2.9–4.4)
Albumin/Glob SerPl: 1 (ref 0.7–1.7)
Alpha 1: 0.2 g/dL (ref 0.0–0.4)
Alpha2 Glob SerPl Elph-Mcnc: 0.7 g/dL (ref 0.4–1.0)
B-Globulin SerPl Elph-Mcnc: 0.8 g/dL (ref 0.7–1.3)
Gamma Glob SerPl Elph-Mcnc: 2.5 g/dL — ABNORMAL HIGH (ref 0.4–1.8)
Globulin, Total: 4.2 g/dL — ABNORMAL HIGH (ref 2.2–3.9)
IgA: 15 mg/dL — ABNORMAL LOW (ref 61–437)
IgG (Immunoglobin G), Serum: 2835 mg/dL — ABNORMAL HIGH (ref 603–1613)
IgM (Immunoglobulin M), Srm: 29 mg/dL (ref 15–143)
M Protein SerPl Elph-Mcnc: 2.3 g/dL — ABNORMAL HIGH
Total Protein ELP: 8 g/dL (ref 6.0–8.5)

## 2020-08-12 DIAGNOSIS — M19041 Primary osteoarthritis, right hand: Secondary | ICD-10-CM | POA: Diagnosis not present

## 2020-08-12 DIAGNOSIS — M19042 Primary osteoarthritis, left hand: Secondary | ICD-10-CM | POA: Diagnosis not present

## 2020-08-26 DIAGNOSIS — M19042 Primary osteoarthritis, left hand: Secondary | ICD-10-CM | POA: Diagnosis not present

## 2020-08-31 NOTE — Progress Notes (Addendum)
[facility] Note  09/02/2020     CHIEF COMPLAINT Patient presents for Retina Follow Up   HISTORY OF PRESENT ILLNESS: Walter Staver. is a 70 y.o. male who presents to the clinic today for:  HPI     Retina Follow Up   Patient presents with  Retinal Break/Detachment.  In left eye.  Duration of 5 weeks.  Since onset it is gradually improving.  I, the attending physician,  performed the HPI with the patient and updated documentation appropriately.        Comments   5 week follow up Vit tear with VH OS-  Doing better.  Still seeing floaters but they seems to be smaller.  He is still seeing FOLs. Mostly seeing FOLs at night but this morning he noticed a quick flash this morning.  Vision also seemed a little blurred this morning upon waking.  Vision has improved since then.  A1C unsure but ok  BS he does not check      Last edited by [name], MD on 09/07/2020  3:57 PM.    Pt states floaters are still improving, he is still seeing fol at night, but states he saw one when he got up this morning, he states his vision was "fuzzy" this morning as well   Referring physician: [name], MD [address] [place],  [place] [address]  HISTORICAL INFORMATION:   Selected notes from the [facility] Referred by Dr. [name]   CURRENT MEDICATIONS: No current outpatient medications on file. (Ophthalmic Drugs)   No current facility-administered medications for this visit. (Ophthalmic Drugs)   Current Outpatient Medications (Other)  Medication Sig   CELEBREX 200 MG capsule Take 200 mg by mouth 2 (two) times daily.   Cholecalciferol (VITAMIN D) 125 MCG (5000 UT) CAPS Take 5,000 Units by mouth daily.   docusate sodium (COLACE) 100 MG capsule Take 1 capsule (100 mg total) by mouth 2 (two) times daily.   fluticasone (FLONASE) 50 MCG/ACT nasal spray    gabapentin (NEURONTIN) 100 MG capsule Take 100 mg by mouth 2 (two) times daily.    lisinopril-hydrochlorothiazide (PRINZIDE,ZESTORETIC) 20-25 MG tablet Take 1 tablet by mouth daily.   pantoprazole (PROTONIX) 40 MG tablet Take 40 mg by mouth daily.   Psyllium (EQ DAILY FIBER PO) Take 5 tablets by mouth daily.   rosuvastatin (CRESTOR) 5 MG tablet Take 5 mg by mouth daily.   Semaglutide,0.25 or 0.'5MG'$ /DOS, (OZEMPIC, 0.25 OR 0.5 MG/DOSE,) 2 MG/1.5ML SOPN    Tamsulosin HCl (FLOMAX) 0.4 MG CAPS Take 0.4 mg by mouth at bedtime.    traMADol (ULTRAM) 50 MG tablet    HYDROcodone-acetaminophen (NORCO) 7.5-325 MG tablet Take 1 tablet by mouth 4 (four) times daily as needed. (Patient not taking: No sig reported)   No current facility-administered medications for this visit. (Other)    REVIEW OF SYSTEMS: ROS   Positive for: Gastrointestinal, Musculoskeletal, Endocrine, Cardiovascular, Eyes Negative for: Constitutional, Neurological, Skin, Genitourinary, HENT, Respiratory, Psychiatric, Allergic/Imm, Heme/Lymph Last edited by [name], COA on 09/02/2020  8:27 AM.      ALLERGIES Allergies  Allergen Reactions   Pork-Derived Products Shortness Of Breath   Shellfish Allergy Shortness Of Breath   Lovastatin Other (See Comments)    Bone pain    Penicillins     UNSPECIFIED CHILDHOOD REACTION  Has patient had a PCN reaction causing immediate rash, facial/tongue/throat swelling, SOB or lightheadedness with hypotension: Unknown Has patient  had a PCN reaction causing severe rash involving mucus membranes or skin necrosis: Unknown Has patient had a PCN reaction that required hospitalization: Unknown Has patient had a PCN reaction occurring within the last 10 years: No If all of the above answers are "NO", then may proceed with Cephalosporin use.     PAST MEDICAL HISTORY Past Medical History:  Diagnosis Date   AAA (abdominal aortic aneurysm) (HCC)    4.0 cm AAA 08/22/17 U/S   Allergy    Allergic Rhinitis   Anemia    Arthritis    spinal steniosis   Benign prostate hyperplasia     with lower urinary tract symptoms   Carpal tunnel syndrome on right    Cataracts, both eyes    Chronic back pain    caused by spinal stenosis   Elevated PSA    GERD (gastroesophageal reflux disease)    Hyperlipidemia    Hypertension    Essential.     MGUS (monoclonal gammopathy of unknown significance)    Microhematuria    OA (osteoarthritis) of knee    Pneumonia    Hx -when very young   Pre-diabetes    Seasonal allergies    Spinal stenosis    of lumbosacral region.  Causes chronic back pain.   Upper abdominal pain    Past Surgical History:  Procedure Laterality Date   ABDOMINAL EXPOSURE N/A 09/05/2017   Procedure: ABDOMINAL EXPOSURE;  Surgeon: Rosetta Posner, MD;  Location: [facility] OR;  Service: Vascular;  Laterality: N/A;   ANTERIOR LAT LUMBAR FUSION Left 01/26/2016   Procedure: LEFT SIDED LATERAL INTERBODY FUSION, LUMBAR 3-4 WITH INSTRUMENTATION AND ALLOGRAFT;  Surgeon: Phylliss Bob, MD;  Location: [facility];  Service: Orthopedics;  Laterality: Left;  LEFT SIDED LATERAL INTERBODY FUSION, LUMBAR 3-4 WITH INSTRUMENTATION AND ALLOGRAFT   ANTERIOR LAT LUMBAR FUSION Right 08/14/2018   Procedure: RIGHT LUMBAR two to LUMBAR three LATERAL INTERBODY FUSION WITH INSTRUMENTATION AND ALLOGRAFT;  Surgeon: Phylliss Bob, MD;  Location: [facility];  Service: Orthopedics;  Laterality: Right;   ANTERIOR LATERAL LUMBAR FUSION WITH PERCUTANEOUS SCREW 1 LEVEL N/A 09/05/2017   Procedure: LUMBAR 5 - SACRUM 1 ANTERIOR LUMBAR INTERBODY FUSION WITH INSTRUMENTATION AND ALLOGRAFT;  Surgeon: Phylliss Bob, MD;  Location: [facility];  Service: Orthopedics;  Laterality: N/A;   BACK SURGERY  01/2015   dr. Lynann Bologna. [id number].   CARPAL TUNNEL RELEASE     right    CATARACT EXTRACTION, BILATERAL     COLONOSCOPY     COLONOSCOPY  2010,2016   polyp   ELBOW SURGERY     right ulnar release   ELBOW SURGERY     LEFT CTR   EYE SURGERY     JOINT REPLACEMENT     left- Sept 2016   KNEE ARTHROSCOPY  11/23/2010   Procedure:  ARTHROSCOPY KNEE;  Surgeon: Kerin Salen;  Location: [facility];  Service: Orthopedics;  Laterality: Left;  arthroscopy left knee   KNEE ARTHROSCOPY     left x 2   Knee scar tissue removal Left 2018   LATERAL FUSION LUMBAR SPINE  01./17/2018   polypectomy  2010   SHOULDER ARTHROSCOPY Left    x2   TONSILLECTOMY     TOTAL KNEE ARTHROPLASTY Left 09/21/2014   Procedure: TOTAL KNEE ARTHROPLASTY;  Surgeon: Frederik Pear, MD;  Location: [facility];  Service: Orthopedics;  Laterality: Left;   ULNAR NERVE TRANSPOSITION Left 04/16/2017   Procedure: LEFT ULNAR NEUROPLASTY AT THE WRIST AND TRANSPOSITION AT THE ELBOW;  Surgeon: Milly Jakob, MD;  Location: [facility];  Service: Orthopedics;  Laterality: Left;   WISDOM TOOTH EXTRACTION      FAMILY HISTORY Family History  Problem Relation Age of Onset   Cancer Mother    Liver disease Father    Colon cancer Maternal Uncle 67   Colon polyps Neg Hx    Esophageal cancer Neg Hx    Rectal cancer Neg Hx    Stomach cancer Neg Hx     SOCIAL HISTORY Social History   Tobacco Use   Smoking status: Former    Types: Cigarettes    Quit date: 11/21/1980    Years since quitting: 39.8   Smokeless tobacco: Never  Vaping Use   Vaping Use: Never used  Substance Use Topics   Alcohol use: Yes    Alcohol/week: 0.0 standard drinks    Comment: Very rare   Drug use: No         OPHTHALMIC EXAM:  Base Eye Exam     Visual Acuity (Snellen - Linear)       Right Left   Dist [place] 20/20- 20/25-   Dist ph [place]  NI         Tonometry (Tonopen, 8:35 AM)       Right Left   Pressure 14 14         Pupils       Dark Light Shape React APD   Right 2 1 Round Brisk None   Left 2 1 Round Brisk None         Visual Fields (Counting fingers)       Left Right    Full Full         Extraocular Movement       Right Left    Full Full         Neuro/Psych     Oriented x3: Yes   Mood/Affect: Normal         Dilation      Both eyes: 1.0% Mydriacyl, 2.5% Phenylephrine @ 8:35 AM           Slit Lamp and Fundus Exam     Slit Lamp Exam       Right Left   Lids/Lashes Dermatochalasis - upper lid Dermatochalasis - upper lid   Conjunctiva/Sclera White and quiet White and quiet   Cornea arcus, well healed cataract wound arcus, well healed cataract wound   Anterior Chamber Deep and quiet Deep and quiet   Iris Round and moderately dilated Round and moderately dilated   Lens PC IOL in good position PC IOL in good position   Vitreous Vitreous syneresis Vitreous syneresis, Posterior vitreous detachment, blood stained vitreous condensations and debris clearing centrally and settling inferiorly, prominent condensation overlying disc         Fundus Exam       Right Left   Disc Pink and Sharp 2-3+Pallor, Sharp rim   C/D Ratio 0.2 0.5   Macula Flat, Blunted foveal reflex, RPE mottling, No heme or edema Flat, Blunted foveal reflex, mild RPE mottling, No heme or edema   Vessels mild attenuation, mild tortuousity attenuated, mild tortuousity   Periphery Attached    Attached, horseshoe tear at 0230 -- sealed w/ good laser and cryo changes, mild pre-retinal heme settled inferiorly, no new RT/RD            IMAGING AND PROCEDURES  Imaging and Procedures for 09/02/2020  OCT, Retina - OU - Both Eyes  Right Eye Quality was good. Central Foveal Thickness: 278. Progression has been stable. Findings include normal foveal contour, no IRF, no SRF (Partial PVD).   Left Eye Quality was good. Central Foveal Thickness: 286. Progression has improved. Findings include normal foveal contour, no IRF, no SRF, retinal drusen (Mild interval improvement in vitreous opacities ).   Notes *Images captured and stored on drive  Diagnosis / Impression:  NFP; no IRF/SRF OU OS: Interval improvement in vitreous opacities  Clinical management:  See below  Abbreviations: NFP - Normal foveal profile. CME - cystoid macular  edema. PED - pigment epithelial detachment. IRF - intraretinal fluid. SRF - subretinal fluid. EZ - ellipsoid zone. ERM - epiretinal membrane. ORA - outer retinal atrophy. ORT - outer retinal tubulation. SRHM - subretinal hyper-reflective material. IRHM - intraretinal hyper-reflective material            ASSESSMENT/PLAN:    ICD-10-CM   1. Retinal tear of left eye  H33.312     2. Retinal edema  H35.81 OCT, Retina - OU - Both Eyes    3. Vitreous hemorrhage of left eye (HCC)  H43.12     4. Essential hypertension  I10     5. Hypertensive retinopathy of both eyes  H35.033     6. Pseudophakia, both eyes  Z96.1      1-3. Retinal tear w/ vitreous hemorrhage, OS  - Acute PVD on Monday, 04.25.22, w/ symptomatic floaters  - was seen by [facility] 04.27.22 -- HST noted OS - horseshoe tear located at 0230 with punctate heme surrounding - s/p laser retinopexy + cryopexy OS (04.27.22) -- good laser and cryo scarring in place - HST sealed - no new RT/RD - significant vitreous debris and blood-stained vitreous condensations -- improving very slowly - VH precautions reviewed -- minimize activities, keep head elevated, avoid ASA/NSAIDs/blood thinners as able - f/u in 2 months, sooner prn - DFE, OCT  4,5. Hypertensive retinopathy OU - discussed importance of tight BP control - monitor   6. Pseudophakia OU  - s/p CE/IOL OU  - IOLs in good position, doing well  - monitor   Ophthalmic Meds Ordered this visit:  No orders of the defined types were placed in this encounter.     Return in about 2 months (around 11/02/2020) for f/u retinal tear OS, DFE, OCT.  There are no Patient Instructions on file for this visit.  Explained the diagnoses, plan, and follow up with the patient and they expressed understanding.  Patient expressed understanding of the importance of proper follow up care.   This document serves as a record of services personally performed by Gardiner Sleeper, MD, PhD. It  was created on their behalf by San Jetty. Owens Shark, OA an ophthalmic technician. The creation of this record is the provider's dictation and/or activities during the visit.    Electronically signed by: San Jetty. Owens Shark, [place] 08.23.2022 4:05 PM   Gardiner Sleeper, M.D., Ph.D. Diseases & Surgery of the Retina and Vitreous [facility]  I have reviewed the above documentation for accuracy and completeness, and I agree with the above. Gardiner Sleeper, M.D., Ph.D. 09/07/20 4:05 PM   Abbreviations: M myopia (nearsighted); A astigmatism; H hyperopia (farsighted); P presbyopia; Mrx spectacle prescription;  CTL contact lenses; OD right eye; OS left eye; OU both eyes  XT exotropia; ET esotropia; PEK punctate epithelial keratitis; PEE punctate epithelial erosions; DES dry eye syndrome; MGD meibomian gland dysfunction; ATs artificial tears; PFAT's  preservative free artificial tears; [place] nuclear sclerotic cataract; PSC posterior subcapsular cataract; ERM epi-retinal membrane; PVD posterior vitreous detachment; RD retinal detachment; DM diabetes mellitus; DR diabetic retinopathy; NPDR non-proliferative diabetic retinopathy; PDR proliferative diabetic retinopathy; CSME clinically significant macular edema; DME diabetic macular edema; dbh dot blot hemorrhages; CWS cotton wool spot; POAG primary open angle glaucoma; C/D cup-to-disc ratio; HVF humphrey visual field; GVF goldmann visual field; OCT optical coherence tomography; IOP intraocular pressure; BRVO Branch retinal vein occlusion; CRVO central retinal vein occlusion; CRAO central retinal artery occlusion; BRAO branch retinal artery occlusion; RT retinal tear; SB scleral buckle; PPV pars plana vitrectomy; VH Vitreous hemorrhage; PRP panretinal laser photocoagulation; IVK intravitreal kenalog; VMT vitreomacular traction; MH Macular hole;  NVD neovascularization of the disc; NVE neovascularization elsewhere; AREDS age related eye disease study; ARMD age  related macular degeneration; POAG primary open angle glaucoma; EBMD epithelial/anterior basement membrane dystrophy; ACIOL anterior chamber intraocular lens; IOL intraocular lens; PCIOL posterior chamber intraocular lens; Phaco/IOL phacoemulsification with intraocular lens placement; [place] photorefractive keratectomy; LASIK laser assisted in situ keratomileusis; HTN hypertension; DM diabetes mellitus; COPD chronic obstructive pulmonary disease

## 2020-09-02 ENCOUNTER — Ambulatory Visit (INDEPENDENT_AMBULATORY_CARE_PROVIDER_SITE_OTHER): Payer: Medicare Other | Admitting: Ophthalmology

## 2020-09-02 ENCOUNTER — Other Ambulatory Visit: Payer: Self-pay

## 2020-09-02 DIAGNOSIS — Z961 Presence of intraocular lens: Secondary | ICD-10-CM

## 2020-09-02 DIAGNOSIS — H3581 Retinal edema: Secondary | ICD-10-CM

## 2020-09-02 DIAGNOSIS — I10 Essential (primary) hypertension: Secondary | ICD-10-CM | POA: Diagnosis not present

## 2020-09-02 DIAGNOSIS — H4312 Vitreous hemorrhage, left eye: Secondary | ICD-10-CM

## 2020-09-02 DIAGNOSIS — H35033 Hypertensive retinopathy, bilateral: Secondary | ICD-10-CM

## 2020-09-02 DIAGNOSIS — H33312 Horseshoe tear of retina without detachment, left eye: Secondary | ICD-10-CM

## 2020-09-07 ENCOUNTER — Encounter (INDEPENDENT_AMBULATORY_CARE_PROVIDER_SITE_OTHER): Payer: Self-pay | Admitting: Ophthalmology

## 2020-09-15 DIAGNOSIS — R972 Elevated prostate specific antigen [PSA]: Secondary | ICD-10-CM | POA: Diagnosis not present

## 2020-09-28 DIAGNOSIS — M19049 Primary osteoarthritis, unspecified hand: Secondary | ICD-10-CM | POA: Diagnosis not present

## 2020-09-28 DIAGNOSIS — M65331 Trigger finger, right middle finger: Secondary | ICD-10-CM | POA: Diagnosis not present

## 2020-09-28 DIAGNOSIS — M65322 Trigger finger, left index finger: Secondary | ICD-10-CM | POA: Diagnosis not present

## 2020-10-05 DIAGNOSIS — M25562 Pain in left knee: Secondary | ICD-10-CM | POA: Diagnosis not present

## 2020-10-14 ENCOUNTER — Other Ambulatory Visit: Payer: Self-pay

## 2020-10-14 ENCOUNTER — Inpatient Hospital Stay: Payer: Medicare Other | Attending: Oncology

## 2020-10-14 DIAGNOSIS — D472 Monoclonal gammopathy: Secondary | ICD-10-CM | POA: Diagnosis not present

## 2020-10-14 DIAGNOSIS — D649 Anemia, unspecified: Secondary | ICD-10-CM | POA: Diagnosis not present

## 2020-10-14 LAB — COMPREHENSIVE METABOLIC PANEL WITH GFR
ALT: 22 U/L (ref 0–44)
AST: 34 U/L (ref 15–41)
Albumin: 3.9 g/dL (ref 3.5–5.0)
Alkaline Phosphatase: 85 U/L (ref 38–126)
Anion gap: 7 (ref 5–15)
BUN: 28 mg/dL — ABNORMAL HIGH (ref 8–23)
CO2: 26 mmol/L (ref 22–32)
Calcium: 9.7 mg/dL (ref 8.9–10.3)
Chloride: 104 mmol/L (ref 98–111)
Creatinine, Ser: 1.04 mg/dL (ref 0.61–1.24)
GFR, Estimated: >60 mL/min
Glucose, Bld: 124 mg/dL — ABNORMAL HIGH (ref 70–99)
Potassium: 4.1 mmol/L (ref 3.5–5.1)
Sodium: 137 mmol/L (ref 135–145)
Total Bilirubin: 0.8 mg/dL (ref 0.3–1.2)
Total Protein: 9.1 g/dL — ABNORMAL HIGH (ref 6.5–8.1)

## 2020-10-14 LAB — PROTEIN / CREATININE RATIO, URINE
Creatinine, Urine: 113.5 mg/dL
Protein Creatinine Ratio: 0.05 mg/mg{Cre} (ref 0.00–0.15)
Total Protein, Urine: 6 mg/dL

## 2020-10-14 LAB — CBC WITH DIFFERENTIAL/PLATELET
Abs Immature Granulocytes: 0.01 10*3/uL (ref 0.00–0.07)
Basophils Absolute: 0 10*3/uL (ref 0.0–0.1)
Basophils Relative: 1 %
Eosinophils Absolute: 0.1 10*3/uL (ref 0.0–0.5)
Eosinophils Relative: 4 %
HCT: 34.6 % — ABNORMAL LOW (ref 39.0–52.0)
Hemoglobin: 11.5 g/dL — ABNORMAL LOW (ref 13.0–17.0)
Immature Granulocytes: 0 %
Lymphocytes Relative: 29 %
Lymphs Abs: 1 10*3/uL (ref 0.7–4.0)
MCH: 30.2 pg (ref 26.0–34.0)
MCHC: 33.2 g/dL (ref 30.0–36.0)
MCV: 90.8 fL (ref 80.0–100.0)
Monocytes Absolute: 0.4 10*3/uL (ref 0.1–1.0)
Monocytes Relative: 12 %
Neutro Abs: 1.9 10*3/uL (ref 1.7–7.7)
Neutrophils Relative %: 54 %
Platelets: 153 10*3/uL (ref 150–400)
RBC: 3.81 MIL/uL — ABNORMAL LOW (ref 4.22–5.81)
RDW: 13.7 % (ref 11.5–15.5)
WBC: 3.6 10*3/uL — ABNORMAL LOW (ref 4.0–10.5)
nRBC: 0 % (ref 0.0–0.2)

## 2020-10-15 LAB — KAPPA/LAMBDA LIGHT CHAINS
Kappa free light chain: 80.9 mg/L — ABNORMAL HIGH (ref 3.3–19.4)
Kappa, lambda light chain ratio: 7.56 — ABNORMAL HIGH (ref 0.26–1.65)
Lambda free light chains: 10.7 mg/L (ref 5.7–26.3)

## 2020-10-18 DIAGNOSIS — H33312 Horseshoe tear of retina without detachment, left eye: Secondary | ICD-10-CM | POA: Diagnosis not present

## 2020-10-18 DIAGNOSIS — H0102A Squamous blepharitis right eye, upper and lower eyelids: Secondary | ICD-10-CM | POA: Diagnosis not present

## 2020-10-18 DIAGNOSIS — H43812 Vitreous degeneration, left eye: Secondary | ICD-10-CM | POA: Diagnosis not present

## 2020-10-18 DIAGNOSIS — H0102B Squamous blepharitis left eye, upper and lower eyelids: Secondary | ICD-10-CM | POA: Diagnosis not present

## 2020-10-18 DIAGNOSIS — H04123 Dry eye syndrome of bilateral lacrimal glands: Secondary | ICD-10-CM | POA: Diagnosis not present

## 2020-10-18 DIAGNOSIS — H26492 Other secondary cataract, left eye: Secondary | ICD-10-CM | POA: Diagnosis not present

## 2020-10-18 DIAGNOSIS — Z961 Presence of intraocular lens: Secondary | ICD-10-CM | POA: Diagnosis not present

## 2020-10-18 LAB — MULTIPLE MYELOMA PANEL, SERUM
Albumin SerPl Elph-Mcnc: 4.1 g/dL (ref 2.9–4.4)
Albumin/Glob SerPl: 1 (ref 0.7–1.7)
Alpha 1: 0.2 g/dL (ref 0.0–0.4)
Alpha2 Glob SerPl Elph-Mcnc: 0.7 g/dL (ref 0.4–1.0)
B-Globulin SerPl Elph-Mcnc: 0.8 g/dL (ref 0.7–1.3)
Gamma Glob SerPl Elph-Mcnc: 2.6 g/dL — ABNORMAL HIGH (ref 0.4–1.8)
Globulin, Total: 4.3 g/dL — ABNORMAL HIGH (ref 2.2–3.9)
IgA: 15 mg/dL — ABNORMAL LOW (ref 61–437)
IgG (Immunoglobin G), Serum: 3208 mg/dL — ABNORMAL HIGH (ref 603–1613)
IgM (Immunoglobulin M), Srm: 30 mg/dL (ref 15–143)
M Protein SerPl Elph-Mcnc: 2.5 g/dL — ABNORMAL HIGH
Total Protein ELP: 8.4 g/dL (ref 6.0–8.5)

## 2020-10-19 DIAGNOSIS — M1711 Unilateral primary osteoarthritis, right knee: Secondary | ICD-10-CM | POA: Diagnosis not present

## 2020-10-20 NOTE — Progress Notes (Signed)
[place]  [phone] [phone]    ID: Walter White. 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 He continues under observation.    He has not undergone any scans since his last visit.  We are following his monoclonal spike and his kappa lambda ratio.   Results for Windish, Launa Flight. "Walter White" (MRN [id number]) as of 10/21/2020 09:58  Ref. Range 03/31/2019 11:45 10/10/2019 08:41 04/16/2020 08:37 07/30/2020 09:03 10/14/2020 08:45  M Protein SerPl Elph-Mcnc Latest Ref Range: Not Observed g/dL 2.2 (H) 2.1 (H) 2.5 (H) 2.3 (H) 2.5 (H)  Results for Hedberg, Launa Flight. "Walter White" (MRN [id number]) as of 10/21/2020 09:58  Ref. Range 03/31/2019 11:45 10/10/2019 08:41 04/16/2020 08:36 07/30/2020 09:03 10/14/2020 08:44  Kappa, lambda light chain ratio Latest Ref Range: 0.26 - 1.65  4.08 (H) 5.38 (H) 5.79 (H) 4.65 (H) 7.56 (H)  Results for Fernando, Launa Flight. "Walter White" (MRN [id number]) as of 10/21/2020 09:58  Ref. Range 03/31/2019 11:45 10/10/2019 08:41 04/16/2020 08:37 07/30/2020 09:03 10/14/2020 08:45  IgG (Immunoglobin G), Serum Latest Ref Range: 603 - 1,613 mg/dL 2,890 (H) 2,851 (H) 3,349 (H) 2,835 (H) 3,208 (H)    REVIEW OF SYSTEMS: Walter White had back surgery under Dr. Christella Noa 01/16/2020, specifically Lumbar 1-2 Posterior lumbar interbody  Arthrodesis Laminectomy L1 beyond the needed exposure for a plif Synthes cages 64mx 294mx2, autograft morsels Posterolateral arthrodesis T11-L2 Segmental pedicle  screw fixation T10-S1 Nuvasive hardware He did well with the surgery and is now off narcotics taking some tramadol and Tylenol as needed for pain.  He was hoping to get some water therapy but has not been able to get that paid for.  He has also been found to have benign prostatic hypertrophy and is followed by Dr. DaRosana Hoesn urology for that.  Aside from these issues a detailed review of systems today was stable   COVID 19 VACCINATION STATUS: Pfizer x3   HISTORY OF CURRENT ILLNESS: From the original intake note:   WiJanene Harveyis followed by Dr. PhShelia Mediawho noted a high total protein at 8.8 with an albumin of 4.0.  An SPEP was obtained on 03/05/2018, showing an M spike of 2.3.  Immunofixation was not performed.  The patient was also found to be mildly anemic, and to have a slightly low white cell count.  Given these findings he is being referred for evaluation of plasma cell dyscrasia, possibly myeloma.  The patient's subsequent history is as detailed below.   PAST MEDICAL HISTORY: Past Medical History:  Diagnosis Date   AAA (abdominal aortic aneurysm) (HCC)    4.0 cm AAA 08/22/17 U/S   Allergy    Allergic Rhinitis   Anemia    Arthritis    spinal steniosis   Benign prostate hyperplasia    with lower urinary tract symptoms   Carpal tunnel syndrome on right    Cataracts, both eyes    Chronic back pain    caused by spinal stenosis   Elevated PSA    GERD (gastroesophageal reflux disease)    Hyperlipidemia  Hypertension    Essential.     MGUS (monoclonal gammopathy of unknown significance)    Microhematuria    OA (osteoarthritis) of knee    Pneumonia    Hx -when very young   Pre-diabetes    Seasonal allergies    Spinal stenosis    of lumbosacral region.  Causes chronic back pain.   Upper abdominal pain     PAST SURGICAL HISTORY: Past Surgical History:  Procedure Laterality Date   ABDOMINAL EXPOSURE N/A 09/05/2017   Procedure: ABDOMINAL EXPOSURE;  Surgeon: Rosetta Posner, MD;  [facility];  Service: Vascular;  Laterality: N/A;   ANTERIOR LAT LUMBAR FUSION Left 01/26/2016   Procedure: LEFT SIDED LATERAL INTERBODY FUSION, LUMBAR 3-4 WITH INSTRUMENTATION AND ALLOGRAFT;  Surgeon: Phylliss Bob, MD;  Location: [place];  Service: Orthopedics;  Laterality: Left;  LEFT SIDED LATERAL INTERBODY FUSION, LUMBAR 3-4 WITH INSTRUMENTATION AND ALLOGRAFT   ANTERIOR LAT LUMBAR FUSION Right 08/14/2018   Procedure: RIGHT LUMBAR two to LUMBAR three LATERAL INTERBODY FUSION WITH INSTRUMENTATION AND ALLOGRAFT;  Surgeon: Phylliss Bob, MD;  Location: [place];  Service: Orthopedics;  Laterality: Right;   ANTERIOR LATERAL LUMBAR FUSION WITH PERCUTANEOUS SCREW 1 LEVEL N/A 09/05/2017   Procedure: LUMBAR 5 - SACRUM 1 ANTERIOR LUMBAR INTERBODY FUSION WITH INSTRUMENTATION AND ALLOGRAFT;  Surgeon: Phylliss Bob, MD;  Location: [place];  Service: Orthopedics;  Laterality: N/A;   BACK SURGERY  01/2015   dr. Lynann Bologna. [id number].   CARPAL TUNNEL RELEASE     right    CATARACT EXTRACTION, BILATERAL     COLONOSCOPY     COLONOSCOPY  2010,2016   polyp   ELBOW SURGERY     right ulnar release   ELBOW SURGERY     LEFT CTR   EYE SURGERY     JOINT REPLACEMENT     left- Sept 2016   KNEE ARTHROSCOPY  11/23/2010   Procedure: ARTHROSCOPY KNEE;  Surgeon: Kerin Salen;  Location: [place];  Service: Orthopedics;  Laterality: Left;  arthroscopy left knee   KNEE ARTHROSCOPY     left x 2   Knee scar tissue removal Left 2018   LATERAL FUSION LUMBAR SPINE  01./17/2018   polypectomy  2010   SHOULDER ARTHROSCOPY Left    x2   TONSILLECTOMY     TOTAL KNEE ARTHROPLASTY Left 09/21/2014   Procedure: TOTAL KNEE ARTHROPLASTY;  Surgeon: Frederik Pear, MD;  Location: [place];  Service: Orthopedics;  Laterality: Left;   ULNAR NERVE TRANSPOSITION Left 04/16/2017   Procedure: LEFT ULNAR NEUROPLASTY AT THE WRIST AND TRANSPOSITION AT THE ELBOW;  Surgeon: Milly Jakob, MD;  Location: [place];  Service: Orthopedics;  Laterality: Left;   WISDOM TOOTH EXTRACTION      FAMILY HISTORY: Family History  Problem Relation Age of Onset   Cancer Mother    Liver disease Father    Colon cancer Maternal Uncle 36   Colon polyps Neg Hx    Esophageal cancer Neg Hx    Rectal cancer Neg Hx    Stomach cancer Neg Hx   Jessy's father died from heart failure at age 36. Patients' mother died from lung cancer at age 4. The patient has 1 sister, who is deceased due to a motor vehicle accident. Patient denies anyone in her family having breast, ovarian, prostate, or pancreatic cancer.    SOCIAL HISTORY:  Walter White is retired from the [employer]. His wife, [name], is a retired Pharmacist, hospital. Walter White's  son Gerald Stabs graduated with his PhD in History, but he currently works at [employer] in [employer]. Amun's other son Gerald Stabs, is a Counsellor for [employer] and coaches football at [employer].  Gerald Stabs developed a pulmonary embolus in 2021, likely related to sedentary occupation and overweight.  He is recovering well from that.  Walter White has two grandchildren. He attends [employer].    ADVANCED DIRECTIVES: Walter White s wife, [name], is automatically his healthcare power of attorney.     HEALTH MAINTENANCE: Social History   Tobacco Use   Smoking status: Former    Types: Cigarettes    Quit date: 11/21/1980    Years since quitting: 39.9   Smokeless tobacco: Never  Vaping Use   Vaping Use: Never used  Substance Use Topics   Alcohol use: Yes    Alcohol/week: 0.0 standard drinks    Comment: Very rare   Drug use: No     Colonoscopy: 04/2019 (Dr. Henrene Pastor), repeat 2024  Bone density:     Allergies  Allergen Reactions   Pork-Derived Products Shortness Of Breath   Shellfish Allergy Shortness Of Breath   Lovastatin Other (See Comments)    Bone pain    Penicillins     UNSPECIFIED CHILDHOOD REACTION  Has patient had a PCN reaction causing immediate rash,  facial/tongue/throat swelling, SOB or lightheadedness with hypotension: Unknown Has patient had a PCN reaction causing severe rash involving mucus membranes or skin necrosis: Unknown Has patient had a PCN reaction that required hospitalization: Unknown Has patient had a PCN reaction occurring within the last 10 years: No If all of the above answers are "NO", then may proceed with Cephalosporin use.     Current Outpatient Medications  Medication Sig Dispense Refill   CELEBREX 200 MG capsule Take 200 mg by mouth 2 (two) times daily.     Cholecalciferol (VITAMIN D) 125 MCG (5000 UT) CAPS Take 5,000 Units by mouth daily.     docusate sodium (COLACE) 100 MG capsule Take 1 capsule (100 mg total) by mouth 2 (two) times daily. 10 capsule 0   fluticasone (FLONASE) 50 MCG/ACT nasal spray      gabapentin (NEURONTIN) 100 MG capsule Take 100 mg by mouth 2 (two) times daily.     HYDROcodone-acetaminophen (NORCO) 7.5-325 MG tablet Take 1 tablet by mouth 4 (four) times daily as needed. (Patient not taking: No sig reported)     lisinopril-hydrochlorothiazide (PRINZIDE,ZESTORETIC) 20-25 MG tablet Take 1 tablet by mouth daily.     pantoprazole (PROTONIX) 40 MG tablet Take 40 mg by mouth daily.     Psyllium (EQ DAILY FIBER PO) Take 5 tablets by mouth daily.     rosuvastatin (CRESTOR) 5 MG tablet Take 5 mg by mouth daily.     Semaglutide,0.25 or 0.5MG/DOS, (OZEMPIC, 0.25 OR 0.5 MG/DOSE,) 2 MG/1.5ML SOPN      Tamsulosin HCl (FLOMAX) 0.4 MG CAPS Take 0.4 mg by mouth at bedtime.      traMADol (ULTRAM) 50 MG tablet      No current facility-administered medications for this visit.    OBJECTIVE: White man in no acute distress  Vitals:   10/21/20 0923  BP: 139/66  Pulse: 97  Resp: 18  Temp: 98.2 F (36.8 C)  SpO2: 100%    Wt Readings from Last 3 Encounters:  10/21/20 238 lb 14.4 oz (108.4 kg)  05/07/20 229 lb 11.2 oz (104.2 kg)  01/16/20 236 lb (107 kg)   Body mass index is 37.[place]  kg/m.    ECOG  FS:1 - Symptomatic but completely ambulatory  Sclerae unicteric, EOMs intact Wearing a mask No cervical or supraclavicular adenopathy Lungs no rales or rhonchi Heart regular rate and rhythm Abd soft, obese nontender, positive bowel sounds MSK no focal spinal tenderness Neuro: nonfocal, well oriented, appropriate affect   LAB RESULTS:  CMP     Component Value Date/Time   NA 137 10/14/2020 0844   K 4.1 10/14/2020 0844   CL 104 10/14/2020 0844   CO2 26 10/14/2020 0844   GLUCOSE 124 (H) 10/14/2020 0844   BUN 28 (H) 10/14/2020 0844   CREATININE 1.04 10/14/2020 0844   CREATININE 0.89 07/30/2020 0903   CALCIUM 9.7 10/14/2020 0844   PROT 9.1 (H) 10/14/2020 0844   ALBUMIN 3.9 10/14/2020 0844   AST 34 10/14/2020 0844   AST 34 07/30/2020 0903   ALT 22 10/14/2020 0844   ALT 27 07/30/2020 0903   ALKPHOS 85 10/14/2020 0844   BILITOT 0.8 10/14/2020 0844   BILITOT 0.6 07/30/2020 0903   GFRNONAA >60 10/14/2020 0844   GFRNONAA >60 07/30/2020 0903   GFRAA >60 10/10/2019 0841   GFRAA >60 03/19/2018 1047    Lab Results  Component Value Date   TOTALPROTELP 8.4 10/14/2020   Lab Results  Component Value Date   WBC 3.6 (L) 10/14/2020   NEUTROABS 1.9 10/14/2020   HGB 11.5 (L) 10/14/2020   HCT 34.6 (L) 10/14/2020   MCV 90.8 10/14/2020   PLT 153 10/14/2020   No results found for: LABCA2  No components found for: XLKGMW102  No results for input(s): INR in the last 168 hours.  No results found for: LABCA2  No results found for: VOZ366  No results found for: YQI347  No results found for: QQV956  No results found for: CA2729  No components found for: HGQUANT  No results found for: CEA1 / No results found for: CEA1   No results found for: AFPTUMOR  No results found for: CHROMOGRNA  No results found for: PSA1  Lab Results  Component Value Date   KPAFRELGTCHN 80.9 (H) 10/14/2020   LAMBDASER 10.7 10/14/2020   KAPLAMBRATIO 7.56 (H) 10/14/2020   (kappa/lambda light  chains)  No results found for: HGBA, HGBA2QUANT, HGBFQUANT, HGBSQUAN (Hemoglobinopathy evaluation)   Lab Results  Component Value Date   LDH 220 (H) 03/31/2019    Lab Results  Component Value Date   IRON 82 03/31/2019   TIBC 359 03/31/2019   IRONPCTSAT 23 03/31/2019   (Iron and TIBC)  Lab Results  Component Value Date   FERRITIN 588 (H) 03/31/2019    Urinalysis    Component Value Date/Time   COLORURINE YELLOW 08/07/2018 [place] 08/07/2018 0944   LABSPEC 1.019 08/07/2018 [place] 5.0 08/07/2018 [place] 08/07/2018 [place] 08/07/2018 0944   BILIRUBINUR NEGATIVE 08/07/2018 0944   KETONESUR NEGATIVE 08/07/2018 0944   PROTEINUR NEGATIVE 08/07/2018 0944   UROBILINOGEN 0.2 09/10/2014 0852   NITRITE NEGATIVE 08/07/2018 0944   LEUKOCYTESUR MODERATE (A) 08/07/2018 0944    STUDIES:  No results found.   ELIGIBLE FOR AVAILABLE RESEARCH PROTOCOL: no   ASSESSMENT: 70 y.o. [place], [place] man with a diagnosis of M-GUS confirmed March 2020 with the following parameters:  2.1 g M spike on SPEP obtained 03/05/2018, IgG kappa by IFE  Kappa/lambda ratio 3.35, kappa at 32.8 mg/L  Beta-2-microglobulin 2.0  IgG/A/M 2606/21/38  WBC 3.2, ANC 2.4, Hb 11.6, MCV 92.0, platelets  155  Creat 1.01, Ca++ 9.7, alb 4.0  Bone studies: no lytic lesions  Bone marrow biopsy 04/02/2018: 9% plasma cells, kappa restricted  (1) anemia: Consistent with "anemia of chronic disease"   (a) normal reticulocytes, folate, B12, and iron studies March 2021    PLAN: I reviewed Walter White's labs with him.  There is no worsening trend and basically things are stable.  He understands MGUS in itself is a benign condition although it could progress to a malignant 1.  At this point though he requires only follow-up.  He will see my partner Dr. Lorenso Courier in about 6 months and before that visit he will have repeat lab work.  Total encounter time 20  minutes.*   Magrinat, Virgie Dad, MD  10/21/20 9:58 AM Medical Oncology and Hematology [facility] [place], [place] [address] Tel. [phone]    Fax. [phone]   I, Wilburn Mylar, am acting as scribe for Dr. Virgie Dad. Magrinat.  I, Lurline Del MD, have reviewed the above documentation for accuracy and completeness, and I agree with the above.   *Total Encounter Time as defined by the Centers for Medicare and Medicaid Services includes, in addition to the face-to-face time of a patient visit (documented in the note above) non-face-to-face time: obtaining and reviewing outside history, ordering and reviewing medications, tests or procedures, care coordination (communications with other health care professionals or caregivers) and documentation in the medical record.

## 2020-10-21 ENCOUNTER — Inpatient Hospital Stay (HOSPITAL_BASED_OUTPATIENT_CLINIC_OR_DEPARTMENT_OTHER): Payer: Medicare Other | Admitting: Oncology

## 2020-10-21 ENCOUNTER — Other Ambulatory Visit: Payer: Self-pay

## 2020-10-21 VITALS — BP 139/66 | HR 97 | Temp 98.2°F | Resp 18 | Ht 67.0 in | Wt 238.9 lb

## 2020-10-21 DIAGNOSIS — D649 Anemia, unspecified: Secondary | ICD-10-CM | POA: Diagnosis not present

## 2020-10-21 DIAGNOSIS — D472 Monoclonal gammopathy: Secondary | ICD-10-CM

## 2020-10-27 NOTE — Progress Notes (Signed)
[facility] Note  11/02/2020     CHIEF COMPLAINT Patient presents for Retina Follow Up   HISTORY OF PRESENT ILLNESS: Walter Bonet. is a 70 y.o. male who presents to the clinic today for:  HPI     Retina Follow Up   Patient presents with  Other.  In left eye.  This started 2 months ago.  I, the attending physician,  performed the HPI with the patient and updated documentation appropriately.        Comments   Patient here for 2 months retina follow up for retina tears OS. Patient states vision still a little bit blurred in OS. Still has stuff in OS. No eye pain. On occasion sees flash of light.      Last edited by [name], MD on 11/05/2020  3:56 PM.    Pt states his left eye is still blurry and he still has the floaters, he states he sees occasional fol at night  Referring physician: [name], MD [address] [place],  [place] [address]  HISTORICAL INFORMATION:  Selected notes from the MEDICAL RECORD NUMBER Referred by Dr. [name]   CURRENT MEDICATIONS: No current outpatient medications on file. (Ophthalmic Drugs)   No current facility-administered medications for this visit. (Ophthalmic Drugs)   Current Outpatient Medications (Other)  Medication Sig   CELEBREX 200 MG capsule Take 200 mg by mouth 2 (two) times daily.   Cholecalciferol (VITAMIN D) 125 MCG (5000 UT) CAPS Take 5,000 Units by mouth daily.   fluticasone (FLONASE) 50 MCG/ACT nasal spray    gabapentin (NEURONTIN) 100 MG capsule Take 100 mg by mouth 2 (two) times daily.   lisinopril-hydrochlorothiazide (PRINZIDE,ZESTORETIC) 20-25 MG tablet Take 1 tablet by mouth daily.   pantoprazole (PROTONIX) 40 MG tablet Take 40 mg by mouth daily.   Psyllium (EQ DAILY FIBER PO) Take 5 tablets by mouth daily.   rosuvastatin (CRESTOR) 5 MG tablet Take 5 mg by mouth daily.   traMADol (ULTRAM) 50 MG tablet    No current facility-administered medications for this  visit. (Other)    REVIEW OF SYSTEMS: ROS   Positive for: Gastrointestinal, Musculoskeletal, Endocrine, Cardiovascular, Eyes Negative for: Constitutional, Neurological, Skin, Genitourinary, HENT, Respiratory, Psychiatric, Allergic/Imm, Heme/Lymph Last edited by Theodore Demark, COA on 11/02/2020  8:46 AM.       ALLERGIES Allergies  Allergen Reactions   Pork-Derived Products Shortness Of Breath   Shellfish Allergy Shortness Of Breath   Lovastatin Other (See Comments)    Bone pain    Penicillins     UNSPECIFIED CHILDHOOD REACTION  Has patient had a PCN reaction causing immediate rash, facial/tongue/throat swelling, SOB or lightheadedness with hypotension: Unknown Has patient had a PCN reaction causing severe rash involving mucus membranes or skin necrosis: Unknown Has patient had a PCN reaction that required hospitalization: Unknown Has patient had a PCN reaction occurring within the last 10 years: No If all of the above answers are "NO", then may proceed with Cephalosporin use.     PAST MEDICAL HISTORY Past Medical History:  Diagnosis Date   AAA (abdominal aortic aneurysm)    4.0 cm AAA 08/22/17 U/S   Allergy    Allergic Rhinitis   Anemia    Arthritis    spinal steniosis   Benign prostate hyperplasia    with lower urinary tract symptoms   Carpal tunnel syndrome on right    Cataracts, both eyes    Chronic  back pain    caused by spinal stenosis   Elevated PSA    GERD (gastroesophageal reflux disease)    Hyperlipidemia    Hypertension    Essential.     MGUS (monoclonal gammopathy of unknown significance)    Microhematuria    OA (osteoarthritis) of knee    Pneumonia    Hx -when very young   Pre-diabetes    Seasonal allergies    Spinal stenosis    of lumbosacral region.  Causes chronic back pain.   Upper abdominal pain    Past Surgical History:  Procedure Laterality Date   ABDOMINAL EXPOSURE N/A 09/05/2017   Procedure: ABDOMINAL EXPOSURE;  Surgeon: Rosetta Posner, MD;  Location: [facility] OR;  Service: Vascular;  Laterality: N/A;   ANTERIOR LAT LUMBAR FUSION Left 01/26/2016   Procedure: LEFT SIDED LATERAL INTERBODY FUSION, LUMBAR 3-4 WITH INSTRUMENTATION AND ALLOGRAFT;  Surgeon: Phylliss Bob, MD;  Location: [place];  Service: Orthopedics;  Laterality: Left;  LEFT SIDED LATERAL INTERBODY FUSION, LUMBAR 3-4 WITH INSTRUMENTATION AND ALLOGRAFT   ANTERIOR LAT LUMBAR FUSION Right 08/14/2018   Procedure: RIGHT LUMBAR two to LUMBAR three LATERAL INTERBODY FUSION WITH INSTRUMENTATION AND ALLOGRAFT;  Surgeon: Phylliss Bob, MD;  Location: [place];  Service: Orthopedics;  Laterality: Right;   ANTERIOR LATERAL LUMBAR FUSION WITH PERCUTANEOUS SCREW 1 LEVEL N/A 09/05/2017   Procedure: LUMBAR 5 - SACRUM 1 ANTERIOR LUMBAR INTERBODY FUSION WITH INSTRUMENTATION AND ALLOGRAFT;  Surgeon: Phylliss Bob, MD;  Location: [place];  Service: Orthopedics;  Laterality: N/A;   BACK SURGERY  01/2015   dr. Lynann Bologna. [phone].   CARPAL TUNNEL RELEASE     right    CATARACT EXTRACTION, BILATERAL     COLONOSCOPY     COLONOSCOPY  2010,2016   polyp   ELBOW SURGERY     right ulnar release   ELBOW SURGERY     LEFT CTR   EYE SURGERY     JOINT REPLACEMENT     left- Sept 2016   KNEE ARTHROSCOPY  11/23/2010   Procedure: ARTHROSCOPY KNEE;  Surgeon: Kerin Salen;  Location: [place];  Service: Orthopedics;  Laterality: Left;  arthroscopy left knee   KNEE ARTHROSCOPY     left x 2   Knee scar tissue removal Left 2018   LATERAL FUSION LUMBAR SPINE  01./17/2018   polypectomy  2010   SHOULDER ARTHROSCOPY Left    x2   TONSILLECTOMY     TOTAL KNEE ARTHROPLASTY Left 09/21/2014   Procedure: TOTAL KNEE ARTHROPLASTY;  Surgeon: Frederik Pear, MD;  Location: [place];  Service: Orthopedics;  Laterality: Left;   ULNAR NERVE TRANSPOSITION Left 04/16/2017   Procedure: LEFT ULNAR NEUROPLASTY AT THE WRIST AND TRANSPOSITION AT THE ELBOW;  Surgeon: Milly Jakob, MD;  Location: [place];  Service: Orthopedics;  Laterality: Left;   WISDOM TOOTH EXTRACTION      FAMILY HISTORY Family History  Problem Relation Age of Onset   Cancer Mother    Liver disease Father    Colon cancer Maternal Uncle 57   Colon polyps Neg Hx    Esophageal cancer Neg Hx    Rectal cancer Neg Hx    Stomach cancer Neg Hx     SOCIAL HISTORY Social History   Tobacco Use   Smoking status: Former    Types: Cigarettes    Quit date: 11/21/1980    Years since quitting: 39.9   Smokeless tobacco: Never  Vaping Use   Vaping Use:  Never used  Substance Use Topics   Alcohol use: Yes    Alcohol/week: 0.0 standard drinks    Comment: Very rare   Drug use: No       OPHTHALMIC EXAM: Base Eye Exam     Visual Acuity (Snellen - Linear)       Right Left   Dist [place] 20/25 20/25   Dist ph [place] 20/20 NI         Tonometry (Tonopen, 8:43 AM)       Right Left   Pressure 08 11         Pupils       Dark Light Shape React APD   Right 2 1 Round Brisk None   Left 2 1 Round Brisk None         Visual Fields (Counting fingers)       Left Right    Full Full         Extraocular Movement       Right Left    Full Full         Neuro/Psych     Oriented x3: Yes   Mood/Affect: Normal         Dilation     Both eyes: 1.0% Mydriacyl, 2.5% Phenylephrine @ 8:43 AM           Slit Lamp and Fundus Exam     Slit Lamp Exam       Right Left   Lids/Lashes Dermatochalasis - upper lid Dermatochalasis - upper lid   Conjunctiva/Sclera White and quiet White and quiet   Cornea arcus, well healed cataract wound arcus, well healed cataract wound, trace tear film debris   Anterior Chamber Deep and quiet Deep and quiet   Iris Round and moderately dilated Round and moderately dilated   Lens PC IOL in good position PC IOL in good position   Vitreous Vitreous syneresis Vitreous syneresis, Posterior vitreous detachment, pigment stained vitreous condensations and debris clearing centrally  and settling inferiorly, prominent condensation overlying disc, vitreous debris settled inferiorly         Fundus Exam       Right Left   Disc Pink and Sharp 2-3+Pallor, Sharp rim   C/D Ratio 0.2 0.5   Macula Flat, Blunted foveal reflex, RPE mottling, No heme or edema Flat, Blunted foveal reflex, mild RPE mottling, No heme or edema   Vessels mild attenuation, mild tortuousity attenuated, mild tortuousity   Periphery Attached    Attached, horseshoe tear at 0230 -- sealed w/ good laser and cryo changes, mild pre-retinal heme settled inferiorly, no new RT/RD           IMAGING AND PROCEDURES  Imaging and Procedures for 11/02/2020  OCT, Retina - OU - Both Eyes       Right Eye Quality was good. Central Foveal Thickness: 276. Progression has been stable. Findings include normal foveal contour, no IRF, no SRF (Partial PVD).   Left Eye Quality was good. Central Foveal Thickness: 289. Progression has improved. Findings include normal foveal contour, no IRF, no SRF, retinal drusen (interval improvement in vitreous opacities -- essentially resolved).   Notes *Images captured and stored on drive  Diagnosis / Impression:  NFP; no IRF/SRF OU OS: Interval improvement in vitreous opacities -- essentially resolved  Clinical management:  See below  Abbreviations: NFP - Normal foveal profile. CME - cystoid macular edema. PED - pigment epithelial detachment. IRF - intraretinal fluid. SRF - subretinal fluid. EZ - ellipsoid zone. ERM -  epiretinal membrane. ORA - outer retinal atrophy. ORT - outer retinal tubulation. SRHM - subretinal hyper-reflective material. IRHM - intraretinal hyper-reflective material            ASSESSMENT/PLAN:   ICD-10-CM   1. Retinal tear of left eye  H33.312     2. Retinal edema  H35.81 OCT, Retina - OU - Both Eyes    3. Vitreous hemorrhage of left eye (HCC)  H43.12     4. Essential hypertension  I10     5. Hypertensive retinopathy of both eyes  H35.033      6. Pseudophakia, both eyes  Z96.1      1-3. Retinal tear w/ vitreous hemorrhage, OS  - Acute PVD on Monday, 04.25.22, w/ symptomatic floaters  - was seen by Walter White 04.27.22 -- HST noted OS - horseshoe tear located at 0230 with punctate heme surrounding - s/p laser retinopexy + cryopexy OS (04.27.22) -- good laser and cryo scarring in place - HST sealed - no new RT/RD - significant vitreous debris and blood-stained vitreous condensations -- improving very slowly - VH precautions reviewed -- minimize activities, keep head elevated, avoid ASA/NSAIDs/blood thinners as able - f/u in 2 months, sooner prn - DFE, OCT  4,5. Hypertensive retinopathy OU - discussed importance of tight BP control - monitor   6. Pseudophakia OU  - s/p CE/IOL OU  - IOLs in good position, doing well  - monitor   Ophthalmic Meds Ordered this visit:  No orders of the defined types were placed in this encounter.     Return for f/u 3-4 months, retinal tear OS, DFE, OCT.  There are no Patient Instructions on file for this visit.  Explained the diagnoses, plan, and follow up with the patient and they expressed understanding.  Patient expressed understanding of the importance of proper follow up White.   This document serves as a record of services personally performed by Gardiner Sleeper, MD, PhD. It was created on their behalf by Estill Bakes, COT an ophthalmic technician. The creation of this record is the provider's dictation and/or activities during the visit.    Electronically signed by: Estill Bakes, COT 10.19.22 @ 4:02 PM   This document serves as a record of services personally performed by Gardiner Sleeper, MD, PhD. It was created on their behalf by San Jetty. Owens Shark, OA an ophthalmic technician. The creation of this record is the provider's dictation and/or activities during the visit.    Electronically signed by: San Jetty. [place], [place] 10.25.2022 4:02 PM  Gardiner Sleeper, M.D., Ph.D. Diseases &  Surgery of the Retina and Vitreous [facility]  I have reviewed the above documentation for accuracy and completeness, and I agree with the above. Gardiner Sleeper, M.D., Ph.D. 11/05/20 4:02 PM  Abbreviations: M myopia (nearsighted); A astigmatism; H hyperopia (farsighted); P presbyopia; Mrx spectacle prescription;  CTL contact lenses; OD right eye; OS left eye; OU both eyes  XT exotropia; ET esotropia; PEK punctate epithelial keratitis; PEE punctate epithelial erosions; DES dry eye syndrome; MGD meibomian gland dysfunction; ATs artificial tears; PFAT's preservative free artificial tears; [place] nuclear sclerotic cataract; PSC posterior subcapsular cataract; ERM epi-retinal membrane; PVD posterior vitreous detachment; RD retinal detachment; DM diabetes mellitus; DR diabetic retinopathy; NPDR non-proliferative diabetic retinopathy; PDR proliferative diabetic retinopathy; CSME clinically significant macular edema; DME diabetic macular edema; dbh dot blot hemorrhages; CWS cotton wool spot; POAG primary open angle glaucoma; C/D cup-to-disc ratio; HVF humphrey visual field; GVF  goldmann visual field; OCT optical coherence tomography; IOP intraocular pressure; BRVO Branch retinal vein occlusion; CRVO central retinal vein occlusion; CRAO central retinal artery occlusion; BRAO branch retinal artery occlusion; RT retinal tear; SB scleral buckle; PPV pars plana vitrectomy; VH Vitreous hemorrhage; PRP panretinal laser photocoagulation; IVK intravitreal kenalog; VMT vitreomacular traction; MH Macular hole;  NVD neovascularization of the disc; NVE neovascularization elsewhere; AREDS age related eye disease study; ARMD age related macular degeneration; POAG primary open angle glaucoma; EBMD epithelial/anterior basement membrane dystrophy; ACIOL anterior chamber intraocular lens; IOL intraocular lens; PCIOL posterior chamber intraocular lens; Phaco/IOL phacoemulsification with intraocular lens placement;  [place] photorefractive keratectomy; LASIK laser assisted in situ keratomileusis; HTN hypertension; DM diabetes mellitus; COPD chronic obstructive pulmonary disease

## 2020-10-28 DIAGNOSIS — M79645 Pain in left finger(s): Secondary | ICD-10-CM | POA: Diagnosis not present

## 2020-10-28 DIAGNOSIS — M19049 Primary osteoarthritis, unspecified hand: Secondary | ICD-10-CM | POA: Diagnosis not present

## 2020-11-02 ENCOUNTER — Encounter (INDEPENDENT_AMBULATORY_CARE_PROVIDER_SITE_OTHER): Payer: Self-pay | Admitting: Ophthalmology

## 2020-11-02 ENCOUNTER — Other Ambulatory Visit: Payer: Self-pay

## 2020-11-02 ENCOUNTER — Ambulatory Visit (INDEPENDENT_AMBULATORY_CARE_PROVIDER_SITE_OTHER): Payer: Medicare Other | Admitting: Ophthalmology

## 2020-11-02 DIAGNOSIS — Z961 Presence of intraocular lens: Secondary | ICD-10-CM

## 2020-11-02 DIAGNOSIS — H33312 Horseshoe tear of retina without detachment, left eye: Secondary | ICD-10-CM

## 2020-11-02 DIAGNOSIS — H35033 Hypertensive retinopathy, bilateral: Secondary | ICD-10-CM

## 2020-11-02 DIAGNOSIS — H4312 Vitreous hemorrhage, left eye: Secondary | ICD-10-CM | POA: Diagnosis not present

## 2020-11-02 DIAGNOSIS — I10 Essential (primary) hypertension: Secondary | ICD-10-CM | POA: Diagnosis not present

## 2020-11-02 DIAGNOSIS — H3581 Retinal edema: Secondary | ICD-10-CM

## 2020-11-05 ENCOUNTER — Encounter (INDEPENDENT_AMBULATORY_CARE_PROVIDER_SITE_OTHER): Payer: Self-pay | Admitting: Ophthalmology

## 2020-11-16 ENCOUNTER — Other Ambulatory Visit (HOSPITAL_COMMUNITY): Payer: Medicare Other

## 2020-11-16 ENCOUNTER — Ambulatory Visit: Payer: Medicare Other

## 2020-12-01 ENCOUNTER — Ambulatory Visit (INDEPENDENT_AMBULATORY_CARE_PROVIDER_SITE_OTHER): Payer: Medicare Other | Admitting: Physician Assistant

## 2020-12-01 ENCOUNTER — Other Ambulatory Visit: Payer: Self-pay

## 2020-12-01 ENCOUNTER — Ambulatory Visit (HOSPITAL_COMMUNITY)
Admission: RE | Admit: 2020-12-01 | Discharge: 2020-12-01 | Disposition: A | Discharge status: Home / Self Care | Payer: Medicare Other | Source: Ambulatory Visit | Attending: Physician Assistant | Admitting: Physician Assistant

## 2020-12-01 VITALS — BP 151/83 | HR 69 | Temp 98.2°F | Resp 20 | Ht 67.0 in | Wt 237.5 lb

## 2020-12-01 DIAGNOSIS — I7143 Infrarenal abdominal aortic aneurysm, without rupture: Secondary | ICD-10-CM | POA: Diagnosis not present

## 2020-12-01 DIAGNOSIS — I714 Abdominal aortic aneurysm, without rupture, unspecified: Secondary | ICD-10-CM | POA: Diagnosis not present

## 2020-12-01 NOTE — Progress Notes (Signed)
Office Note     CC:  follow up Requesting Provider:  Deland Pretty, MD  HPI: Walter White. is a 70 y.o. (03/11/1950) male who presents for surveillance of abdominal aortic aneurysm.  He had a suboptimal study 6 months ago thus was brought back for recheck having been n.p.o.  He denies any new or changing abdominal or back pain.  He does have history of 5 back surgeries with Dr. Lynann Bologna so he is very much aware of his typical back pain.  He denies any claudication symptoms of his calfs.  He also denies any nonhealing wounds of bilateral lower extremities.  He is a former smoker.   Past Medical History:  Diagnosis Date   AAA (abdominal aortic aneurysm)    4.0 cm AAA 08/22/17 U/S   Allergy    Allergic Rhinitis   Anemia    Arthritis    spinal steniosis   Benign prostate hyperplasia    with lower urinary tract symptoms   Carpal tunnel syndrome on right    Cataracts, both eyes    Chronic back pain    caused by spinal stenosis   Elevated PSA    GERD (gastroesophageal reflux disease)    Hyperlipidemia    Hypertension    Essential.     MGUS (monoclonal gammopathy of unknown significance)    Microhematuria    OA (osteoarthritis) of knee    Pneumonia    Hx -when very young   Pre-diabetes    Seasonal allergies    Spinal stenosis    of lumbosacral region.  Causes chronic back pain.   Upper abdominal pain     Past Surgical History:  Procedure Laterality Date   ABDOMINAL EXPOSURE N/A 09/05/2017   Procedure: ABDOMINAL EXPOSURE;  Surgeon: Rosetta Posner, MD;  Location: [facility] OR;  Service: Vascular;  Laterality: N/A;   ANTERIOR LAT LUMBAR FUSION Left 01/26/2016   Procedure: LEFT SIDED LATERAL INTERBODY FUSION, LUMBAR 3-4 WITH INSTRUMENTATION AND ALLOGRAFT;  Surgeon: Phylliss Bob, MD;  Location: [place];  Service: Orthopedics;  Laterality: Left;  LEFT SIDED LATERAL INTERBODY FUSION, LUMBAR 3-4 WITH INSTRUMENTATION AND ALLOGRAFT   ANTERIOR LAT LUMBAR FUSION Right 08/14/2018   Procedure:  RIGHT LUMBAR two to LUMBAR three LATERAL INTERBODY FUSION WITH INSTRUMENTATION AND ALLOGRAFT;  Surgeon: Phylliss Bob, MD;  Location: [place];  Service: Orthopedics;  Laterality: Right;   ANTERIOR LATERAL LUMBAR FUSION WITH PERCUTANEOUS SCREW 1 LEVEL N/A 09/05/2017   Procedure: LUMBAR 5 - SACRUM 1 ANTERIOR LUMBAR INTERBODY FUSION WITH INSTRUMENTATION AND ALLOGRAFT;  Surgeon: Phylliss Bob, MD;  Location: [place];  Service: Orthopedics;  Laterality: N/A;   BACK SURGERY  01/2015   dr. Lynann Bologna. [phone].   CARPAL TUNNEL RELEASE     right    CATARACT EXTRACTION, BILATERAL     COLONOSCOPY     COLONOSCOPY  2010,2016   polyp   ELBOW SURGERY     right ulnar release   ELBOW SURGERY     LEFT CTR   EYE SURGERY     JOINT REPLACEMENT     left- Sept 2016   KNEE ARTHROSCOPY  11/23/2010   Procedure: ARTHROSCOPY KNEE;  Surgeon: Kerin Salen;  Location: [place];  Service: Orthopedics;  Laterality: Left;  arthroscopy left knee   KNEE ARTHROSCOPY     left x 2   Knee scar tissue removal Left 2018   LATERAL FUSION LUMBAR SPINE  01./17/2018   polypectomy  2010   SHOULDER ARTHROSCOPY Left  x2   TONSILLECTOMY     TOTAL KNEE ARTHROPLASTY Left 09/21/2014   Procedure: TOTAL KNEE ARTHROPLASTY;  Surgeon: Frederik Pear, MD;  Location: [place];  Service: Orthopedics;  Laterality: Left;   ULNAR NERVE TRANSPOSITION Left 04/16/2017   Procedure: LEFT ULNAR NEUROPLASTY AT THE WRIST AND TRANSPOSITION AT THE ELBOW;  Surgeon: Milly Jakob, MD;  Location: [place];  Service: Orthopedics;  Laterality: Left;   WISDOM TOOTH EXTRACTION      Social History   Socioeconomic History   Marital status: Married    Spouse name: [name]    Number of children: 2   Years of education: Some colle   Highest education level: Not on file  Occupational History   Occupation: Retired  Tobacco Use   Smoking status: Former    Types: Cigarettes    Quit date: 11/21/1980    Years since quitting: 40.0    Smokeless tobacco: Never  Vaping Use   Vaping Use: Never used  Substance and Sexual Activity   Alcohol use: Yes    Alcohol/week: 0.0 standard drinks    Comment: Very rare   Drug use: No   Sexual activity: Not on file  Other Topics Concern   Not on file  Social History Narrative   Lives with wife   Caffeine use: tea every day   Social Determinants of Health   Financial Resource Strain: Not on file  Food Insecurity: Not on file  Transportation Needs: Not on file  Physical Activity: Not on file  Stress: Not on file  Social Connections: Not on file  Intimate Partner Violence: Not on file    Family History  Problem Relation Age of Onset   Cancer Mother    Liver disease Father    Colon cancer Maternal Uncle 47   Colon polyps Neg Hx    Esophageal cancer Neg Hx    Rectal cancer Neg Hx    Stomach cancer Neg Hx     Current Outpatient Medications  Medication Sig Dispense Refill   CELEBREX 200 MG capsule Take 200 mg by mouth 2 (two) times daily.     Cholecalciferol (VITAMIN D) 125 MCG (5000 UT) CAPS Take 5,000 Units by mouth daily.     fluticasone (FLONASE) 50 MCG/ACT nasal spray      gabapentin (NEURONTIN) 100 MG capsule Take 100 mg by mouth 2 (two) times daily.     lisinopril-hydrochlorothiazide (PRINZIDE,ZESTORETIC) 20-25 MG tablet Take 1 tablet by mouth daily.     pantoprazole (PROTONIX) 40 MG tablet Take 40 mg by mouth daily.     Psyllium (EQ DAILY FIBER PO) Take 5 tablets by mouth daily.     rosuvastatin (CRESTOR) 5 MG tablet Take 5 mg by mouth daily.     traMADol (ULTRAM) 50 MG tablet      No current facility-administered medications for this visit.    Allergies  Allergen Reactions   Pork-Derived Products Shortness Of Breath   Shellfish Allergy Shortness Of Breath   Lovastatin Other (See Comments)    Bone pain    Penicillins     UNSPECIFIED CHILDHOOD REACTION  Has patient had a PCN reaction causing immediate rash, facial/tongue/throat swelling, SOB or  lightheadedness with hypotension: Unknown Has patient had a PCN reaction causing severe rash involving mucus membranes or skin necrosis: Unknown Has patient had a PCN reaction that required hospitalization: Unknown Has patient had a PCN reaction occurring within the last 10 years: No If all of the above answers are "NO", then  may proceed with Cephalosporin use.      REVIEW OF SYSTEMS:   [X]  denotes positive finding, [ ]  denotes negative finding Cardiac  Comments:  Chest pain or chest pressure:    Shortness of breath upon exertion:    Short of breath when lying flat:    Irregular heart rhythm:        Vascular    Pain in calf, thigh, or hip brought on by ambulation:    Pain in feet at night that wakes you up from your sleep:     Blood clot in your veins:    Leg swelling:         Pulmonary    Oxygen at home:    Productive cough:     Wheezing:         Neurologic    Sudden weakness in arms or legs:     Sudden numbness in arms or legs:     Sudden onset of difficulty speaking or slurred speech:    Temporary loss of vision in one eye:     Problems with dizziness:         Gastrointestinal    Blood in stool:     Vomited blood:         Genitourinary    Burning when urinating:     Blood in urine:        Psychiatric    Major depression:         Hematologic    Bleeding problems:    Problems with blood clotting too easily:        Skin    Rashes or ulcers:        Constitutional    Fever or chills:      PHYSICAL EXAMINATION:  Vitals:   12/01/20 0859  BP: (!) 151/83  Pulse: 69  Resp: 20  Temp: 98.2 F (36.8 C)  TempSrc: Temporal  SpO2: 100%  Weight: 237 lb 8 oz (107.7 kg)  Height: 5\' 7"  (1.702 m)    General:  WDWN in NAD; vital signs documented above Gait: Not observed HENT: WNL, normocephalic Pulmonary: normal non-labored breathing Cardiac: regular HR Abdomen: soft, NT, no masses Skin: without rashes Vascular Exam/Pulses:  Right Left  Radial 2+  (normal) 2+ (normal)  PT 2+ (normal) 2+ (normal)   Extremities: without ischemic changes, without Gangrene , without cellulitis; without open wounds;  Musculoskeletal: no muscle wasting or atrophy  Neurologic: A&O X 3;  No focal weakness or paresthesias are detected Psychiatric:  The pt has Normal affect.   Non-Invasive Vascular Imaging:   4cm proximal abd aorta    ASSESSMENT/PLAN:: 70 y.o. male here for follow up for surveillance of AAA  -AAA unchanged from last year.  Duplex demonstrates 4 cm at its maximal diameter in the proximal abdominal aorta -No indication for repair currently -Recheck AAA duplex in 1 year per protocol   Dagoberto Ligas, PA-C [facility] [phone]  Clinic MD:   Scot Dock

## 2020-12-09 DIAGNOSIS — M4316 Spondylolisthesis, lumbar region: Secondary | ICD-10-CM | POA: Diagnosis not present

## 2020-12-09 DIAGNOSIS — R030 Elevated blood-pressure reading, without diagnosis of hypertension: Secondary | ICD-10-CM | POA: Diagnosis not present

## 2020-12-09 DIAGNOSIS — Z6836 Body mass index (BMI) 36.0-36.9, adult: Secondary | ICD-10-CM | POA: Diagnosis not present

## 2020-12-16 DIAGNOSIS — R972 Elevated prostate specific antigen [PSA]: Secondary | ICD-10-CM | POA: Diagnosis not present

## 2020-12-21 DIAGNOSIS — M1711 Unilateral primary osteoarthritis, right knee: Secondary | ICD-10-CM | POA: Diagnosis not present

## 2021-01-11 DIAGNOSIS — M79645 Pain in left finger(s): Secondary | ICD-10-CM | POA: Diagnosis not present

## 2021-01-20 DIAGNOSIS — M1711 Unilateral primary osteoarthritis, right knee: Secondary | ICD-10-CM | POA: Diagnosis not present

## 2021-01-31 DIAGNOSIS — M545 Low back pain, unspecified: Secondary | ICD-10-CM | POA: Diagnosis not present

## 2021-02-02 DIAGNOSIS — M179 Osteoarthritis of knee, unspecified: Secondary | ICD-10-CM | POA: Diagnosis not present

## 2021-02-02 DIAGNOSIS — I10 Essential (primary) hypertension: Secondary | ICD-10-CM | POA: Diagnosis not present

## 2021-02-02 DIAGNOSIS — M545 Low back pain, unspecified: Secondary | ICD-10-CM | POA: Diagnosis not present

## 2021-02-02 DIAGNOSIS — E1122 Type 2 diabetes mellitus with diabetic chronic kidney disease: Secondary | ICD-10-CM | POA: Diagnosis not present

## 2021-02-02 DIAGNOSIS — E785 Hyperlipidemia, unspecified: Secondary | ICD-10-CM | POA: Diagnosis not present

## 2021-02-02 DIAGNOSIS — Z01818 Encounter for other preprocedural examination: Secondary | ICD-10-CM | POA: Diagnosis not present

## 2021-02-04 DIAGNOSIS — Z01818 Encounter for other preprocedural examination: Secondary | ICD-10-CM | POA: Diagnosis not present

## 2021-02-08 DIAGNOSIS — K219 Gastro-esophageal reflux disease without esophagitis: Secondary | ICD-10-CM | POA: Diagnosis not present

## 2021-02-08 DIAGNOSIS — E785 Hyperlipidemia, unspecified: Secondary | ICD-10-CM | POA: Diagnosis not present

## 2021-02-08 DIAGNOSIS — I10 Essential (primary) hypertension: Secondary | ICD-10-CM | POA: Diagnosis not present

## 2021-02-08 DIAGNOSIS — E1122 Type 2 diabetes mellitus with diabetic chronic kidney disease: Secondary | ICD-10-CM | POA: Diagnosis not present

## 2021-02-08 DIAGNOSIS — M545 Low back pain, unspecified: Secondary | ICD-10-CM | POA: Diagnosis not present

## 2021-02-09 DIAGNOSIS — H6122 Impacted cerumen, left ear: Secondary | ICD-10-CM | POA: Diagnosis not present

## 2021-02-09 DIAGNOSIS — M545 Low back pain, unspecified: Secondary | ICD-10-CM | POA: Diagnosis not present

## 2021-02-14 DIAGNOSIS — M545 Low back pain, unspecified: Secondary | ICD-10-CM | POA: Diagnosis not present

## 2021-02-15 DIAGNOSIS — M79645 Pain in left finger(s): Secondary | ICD-10-CM | POA: Diagnosis not present

## 2021-02-16 DIAGNOSIS — M1711 Unilateral primary osteoarthritis, right knee: Secondary | ICD-10-CM | POA: Diagnosis not present

## 2021-02-17 DIAGNOSIS — M545 Low back pain, unspecified: Secondary | ICD-10-CM | POA: Diagnosis not present

## 2021-02-17 NOTE — Progress Notes (Signed)
[facility] Note  02/23/2021     CHIEF COMPLAINT Patient presents for Retina Follow Up  HISTORY OF PRESENT ILLNESS: Walter Hennon. is a 71 y.o. male who presents to the clinic today for:  HPI     Retina Follow Up   Patient presents with  Other.  In left eye.  This started 4 months ago.  I, the attending physician,  performed the HPI with the patient and updated documentation appropriately.        Comments   Patient here for 4 months retina follow up for retinal tear OS. Patient states vision about the same. 2 weeks ago bent over to pick up something. OS felt something strange. Then noticed black spider webs like had before when had to have laser done. No eye pain.       Last edited by [name], MD on 02/24/2021  9:59 PM.    Pt states about 2 weeks ago, he bent over and started seeing the "spider webs" in his vision again, he said his right eye is sharper than his left, he is having a right knee replacement on Friday  Referring physician: [name], MD [address] [place],  [place] [address]  HISTORICAL INFORMATION:  Selected notes from the MEDICAL RECORD NUMBER Referred by Dr. [name]   CURRENT MEDICATIONS: No current outpatient medications on file. (Ophthalmic Drugs)   No current facility-administered medications for this visit. (Ophthalmic Drugs)   Current Outpatient Medications (Other)  Medication Sig   CELEBREX 200 MG capsule Take 200 mg by mouth 2 (two) times daily.   Cholecalciferol (VITAMIN D) 125 MCG (5000 UT) CAPS Take 5,000 Units by mouth daily.   fluticasone (FLONASE) 50 MCG/ACT nasal spray    gabapentin (NEURONTIN) 100 MG capsule Take 100 mg by mouth 2 (two) times daily.   lisinopril-hydrochlorothiazide (PRINZIDE,ZESTORETIC) 20-25 MG tablet Take 1 tablet by mouth daily.   pantoprazole (PROTONIX) 40 MG tablet Take 40 mg by mouth daily.   Psyllium (EQ DAILY FIBER PO) Take 5 tablets by mouth daily.    rosuvastatin (CRESTOR) 5 MG tablet Take 5 mg by mouth daily.   traMADol (ULTRAM) 50 MG tablet    No current facility-administered medications for this visit. (Other)   REVIEW OF SYSTEMS: ROS   Positive for: Gastrointestinal, Musculoskeletal, Endocrine, Cardiovascular, Eyes Negative for: Constitutional, Neurological, Skin, Genitourinary, HENT, Respiratory, Psychiatric, Allergic/Imm, Heme/Lymph Last edited by Theodore Demark, COA on 02/23/2021  9:52 AM.     ALLERGIES Allergies  Allergen Reactions   Pork-Derived Products Shortness Of Breath   Shellfish Allergy Shortness Of Breath   Lovastatin Other (See Comments)    Bone pain    Penicillins     UNSPECIFIED CHILDHOOD REACTION  Has patient had a PCN reaction causing immediate rash, facial/tongue/throat swelling, SOB or lightheadedness with hypotension: Unknown Has patient had a PCN reaction causing severe rash involving mucus membranes or skin necrosis: Unknown Has patient had a PCN reaction that required hospitalization: Unknown Has patient had a PCN reaction occurring within the last 10 years: No If all of the above answers are "NO", then may proceed with Cephalosporin use.    PAST MEDICAL HISTORY Past Medical History:  Diagnosis Date   AAA (abdominal aortic aneurysm)    4.0 cm AAA 08/22/17 U/S   Allergy    Allergic Rhinitis   Anemia    Arthritis    spinal steniosis   Benign prostate hyperplasia  with lower urinary tract symptoms   Carpal tunnel syndrome on right    Cataracts, both eyes    Chronic back pain    caused by spinal stenosis   Elevated PSA    GERD (gastroesophageal reflux disease)    Hyperlipidemia    Hypertension    Essential.     MGUS (monoclonal gammopathy of unknown significance)    Microhematuria    OA (osteoarthritis) of knee    Pneumonia    Hx -when very young   Pre-diabetes    Seasonal allergies    Spinal stenosis    of lumbosacral region.  Causes chronic back pain.   Upper abdominal pain     Past Surgical History:  Procedure Laterality Date   ABDOMINAL EXPOSURE N/A 09/05/2017   Procedure: ABDOMINAL EXPOSURE;  Surgeon: Rosetta Posner, MD;  Location: [facility];  Service: Vascular;  Laterality: N/A;   ANTERIOR LAT LUMBAR FUSION Left 01/26/2016   Procedure: LEFT SIDED LATERAL INTERBODY FUSION, LUMBAR 3-4 WITH INSTRUMENTATION AND ALLOGRAFT;  Surgeon: Phylliss Bob, MD;  Location: [place];  Service: Orthopedics;  Laterality: Left;  LEFT SIDED LATERAL INTERBODY FUSION, LUMBAR 3-4 WITH INSTRUMENTATION AND ALLOGRAFT   ANTERIOR LAT LUMBAR FUSION Right 08/14/2018   Procedure: RIGHT LUMBAR two to LUMBAR three LATERAL INTERBODY FUSION WITH INSTRUMENTATION AND ALLOGRAFT;  Surgeon: Phylliss Bob, MD;  Location: [place];  Service: Orthopedics;  Laterality: Right;   ANTERIOR LATERAL LUMBAR FUSION WITH PERCUTANEOUS SCREW 1 LEVEL N/A 09/05/2017   Procedure: LUMBAR 5 - SACRUM 1 ANTERIOR LUMBAR INTERBODY FUSION WITH INSTRUMENTATION AND ALLOGRAFT;  Surgeon: Phylliss Bob, MD;  Location: [place];  Service: Orthopedics;  Laterality: N/A;   BACK SURGERY  01/2015   dr. Lynann Bologna. [phone].   CARPAL TUNNEL RELEASE     right    CATARACT EXTRACTION, BILATERAL     COLONOSCOPY     COLONOSCOPY  2010,2016   polyp   ELBOW SURGERY     right ulnar release   ELBOW SURGERY     LEFT CTR   EYE SURGERY     JOINT REPLACEMENT     left- Sept 2016   KNEE ARTHROSCOPY  11/23/2010   Procedure: ARTHROSCOPY KNEE;  Surgeon: Kerin Salen;  Location: [facility];  Service: Orthopedics;  Laterality: Left;  arthroscopy left knee   KNEE ARTHROSCOPY     left x 2   Knee scar tissue removal Left 2018   LATERAL FUSION LUMBAR SPINE  01./17/2018   polypectomy  2010   SHOULDER ARTHROSCOPY Left    x2   TONSILLECTOMY     TOTAL KNEE ARTHROPLASTY Left 09/21/2014   Procedure: TOTAL KNEE ARTHROPLASTY;  Surgeon: Frederik Pear, MD;  Location: [place];  Service: Orthopedics;  Laterality: Left;   ULNAR NERVE TRANSPOSITION Left 04/16/2017    Procedure: LEFT ULNAR NEUROPLASTY AT THE WRIST AND TRANSPOSITION AT THE ELBOW;  Surgeon: Milly Jakob, MD;  Location: [facility];  Service: Orthopedics;  Laterality: Left;   WISDOM TOOTH EXTRACTION     FAMILY HISTORY Family History  Problem Relation Age of Onset   Cancer Mother    Liver disease Father    Colon cancer Maternal Uncle 21   Colon polyps Neg Hx    Esophageal cancer Neg Hx    Rectal cancer Neg Hx    Stomach cancer Neg Hx    SOCIAL HISTORY Social History   Tobacco Use   Smoking status: Former    Types: Cigarettes    Quit date:  11/21/1980    Years since quitting: 40.2   Smokeless tobacco: Never  Vaping Use   Vaping Use: Never used  Substance Use Topics   Alcohol use: Yes    Alcohol/week: 0.0 standard drinks    Comment: Very rare   Drug use: No       OPHTHALMIC EXAM: Base Eye Exam     Visual Acuity (Snellen - Linear)       Right Left   Dist [place] 20/25 20/25 -1   Dist ph [place] NI NI         Tonometry (Tonopen, 9:47 AM)       Right Left   Pressure 12 22         Pupils       Dark Light Shape React APD   Right 2 1 Round Brisk None   Left 2 1 Round Brisk None         Visual Fields (Counting fingers)       Left Right    Full Full         Extraocular Movement       Right Left    Full, Ortho Full, Ortho         Neuro/Psych     Oriented x3: Yes   Mood/Affect: Normal         Dilation     Both eyes: 1.0% Mydriacyl, 2.5% Phenylephrine @ 9:47 AM           Slit Lamp and Fundus Exam     Slit Lamp Exam       Right Left   Lids/Lashes Dermatochalasis - upper lid Dermatochalasis - upper lid   Conjunctiva/Sclera White and quiet White and quiet   Cornea arcus, well healed cataract wound arcus, well healed cataract wound, trace tear film debris   Anterior Chamber Deep and quiet Deep and quiet   Iris Round and moderately dilated Round and moderately dilated   Lens PC IOL in good position PC IOL in good position    Anterior Vitreous Vitreous syneresis Vitreous syneresis, Posterior vitreous detachment, pigment stained vitreous condensations and debris clearing centrally and settling inferiorly, prominent condensation overlying disc, vitreous debris settled inferiorly         Fundus Exam       Right Left   Disc Pink and Sharp mild Pallor, Sharp rim   C/D Ratio 0.2 0.5   Macula Flat, Blunted foveal reflex, RPE mottling, No heme or edema Flat, Blunted foveal reflex, mild RPE mottling, No heme or edema   Vessels mild attenuation, mild tortuousity mild attenuation   Periphery Attached Attached, horseshoe tear at 0230 -- sealed w/ good laser and cryo changes, mild pre-retinal heme settled inferiorly, no new RT/RD           IMAGING AND PROCEDURES  Imaging and Procedures for 02/23/2021  OCT, Retina - OU - Both Eyes       Right Eye Quality was good. Central Foveal Thickness: 278. Progression has been stable. Findings include normal foveal contour, no IRF, no SRF (Partial PVD).   Left Eye Quality was good. Central Foveal Thickness: 281. Progression has improved. Findings include normal foveal contour, no IRF, no SRF, retinal drusen .   Notes *Images captured and stored on drive  Diagnosis / Impression:  NFP; no IRF/SRF OU OS: Interval improvement in vitreous opacities -- essentially resolved  Clinical management:  See below  Abbreviations: NFP - Normal foveal profile. CME - cystoid macular edema. PED - pigment epithelial  detachment. IRF - intraretinal fluid. SRF - subretinal fluid. EZ - ellipsoid zone. ERM - epiretinal membrane. ORA - outer retinal atrophy. ORT - outer retinal tubulation. SRHM - subretinal hyper-reflective material. IRHM - intraretinal hyper-reflective material            ASSESSMENT/PLAN:   ICD-10-CM   1. Retinal tear of left eye  H33.312 OCT, Retina - OU - Both Eyes    2. Vitreous hemorrhage of left eye (HCC)  H43.12     3. Essential hypertension  I10     4.  Hypertensive retinopathy of both eyes  H35.033     5. Pseudophakia, both eyes  Z96.1      1,2. Retinal tear w/ vitreous hemorrhage, OS  - Acute PVD on Monday, 04.25.22, w/ symptomatic floaters  - was seen by [facility] 04.27.22 -- HST noted OS - horseshoe tear located at 0230 with punctate heme surrounding - s/p laser retinopexy + cryopexy OS (04.27.22) -- good laser and cryo scarring in place - HST sealed - no new RT/RD - pt is cleared from a retina standpoint for release to Dr. [name] and resumption of primary eye care  3,4. Hypertensive retinopathy OU - discussed importance of tight BP control - monitor   5. Pseudophakia OU  - s/p CE/IOL OU  - IOLs in good position, doing well  - monitor   Ophthalmic Meds Ordered this visit:  No orders of the defined types were placed in this encounter.    Return if symptoms worsen or fail to improve.  There are no Patient Instructions on file for this visit.  Explained the diagnoses, plan, and follow up with the patient and they expressed understanding.  Patient expressed understanding of the importance of proper follow up care.   This document serves as a record of services personally performed by Gardiner Sleeper, MD, PhD. It was created on their behalf by Orvan Falconer, an ophthalmic technician. The creation of this record is the provider's dictation and/or activities during the visit.    Electronically signed by: Orvan Falconer, OA, 02/24/21  10:09 PM  This document serves as a record of services personally performed by Gardiner Sleeper, MD, PhD. It was created on their behalf by San Jetty. Owens Shark, OA an ophthalmic technician. The creation of this record is the provider's dictation and/or activities during the visit.    Electronically signed by: San Jetty. Owens Shark, [place] 02.15.2023 10:09 PM  Gardiner Sleeper, M.D., Ph.D. Diseases & Surgery of the Retina and Vitreous [facility]  I have reviewed the above  documentation for accuracy and completeness, and I agree with the above. Gardiner Sleeper, M.D., Ph.D. 02/24/21 10:17 PM  Abbreviations: M myopia (nearsighted); A astigmatism; H hyperopia (farsighted); P presbyopia; Mrx spectacle prescription;  CTL contact lenses; OD right eye; OS left eye; OU both eyes  XT exotropia; ET esotropia; PEK punctate epithelial keratitis; PEE punctate epithelial erosions; DES dry eye syndrome; MGD meibomian gland dysfunction; ATs artificial tears; PFAT's preservative free artificial tears; [place] nuclear sclerotic cataract; PSC posterior subcapsular cataract; ERM epi-retinal membrane; PVD posterior vitreous detachment; RD retinal detachment; DM diabetes mellitus; DR diabetic retinopathy; NPDR non-proliferative diabetic retinopathy; PDR proliferative diabetic retinopathy; CSME clinically significant macular edema; DME diabetic macular edema; dbh dot blot hemorrhages; CWS cotton wool spot; POAG primary open angle glaucoma; C/D cup-to-disc ratio; HVF humphrey visual field; GVF goldmann visual field; OCT optical coherence tomography; IOP intraocular pressure; BRVO Branch retinal vein occlusion; CRVO  central retinal vein occlusion; CRAO central retinal artery occlusion; BRAO branch retinal artery occlusion; RT retinal tear; SB scleral buckle; PPV pars plana vitrectomy; VH Vitreous hemorrhage; PRP panretinal laser photocoagulation; IVK intravitreal kenalog; VMT vitreomacular traction; MH Macular hole;  NVD neovascularization of the disc; NVE neovascularization elsewhere; AREDS age related eye disease study; ARMD age related macular degeneration; POAG primary open angle glaucoma; EBMD epithelial/anterior basement membrane dystrophy; ACIOL anterior chamber intraocular lens; IOL intraocular lens; PCIOL posterior chamber intraocular lens; Phaco/IOL phacoemulsification with intraocular lens placement; [place] photorefractive keratectomy; LASIK laser assisted in situ keratomileusis; HTN hypertension; DM  diabetes mellitus; COPD chronic obstructive pulmonary disease

## 2021-02-21 ENCOUNTER — Encounter (INDEPENDENT_AMBULATORY_CARE_PROVIDER_SITE_OTHER): Payer: Medicare Other | Admitting: Ophthalmology

## 2021-02-21 DIAGNOSIS — M545 Low back pain, unspecified: Secondary | ICD-10-CM | POA: Diagnosis not present

## 2021-02-22 ENCOUNTER — Other Ambulatory Visit: Payer: Self-pay | Admitting: Orthopedic Surgery

## 2021-02-23 ENCOUNTER — Ambulatory Visit (INDEPENDENT_AMBULATORY_CARE_PROVIDER_SITE_OTHER): Payer: Medicare Other | Admitting: Ophthalmology

## 2021-02-23 ENCOUNTER — Encounter (INDEPENDENT_AMBULATORY_CARE_PROVIDER_SITE_OTHER): Payer: Self-pay | Admitting: Ophthalmology

## 2021-02-23 ENCOUNTER — Other Ambulatory Visit: Payer: Self-pay

## 2021-02-23 DIAGNOSIS — Z961 Presence of intraocular lens: Secondary | ICD-10-CM

## 2021-02-23 DIAGNOSIS — H35033 Hypertensive retinopathy, bilateral: Secondary | ICD-10-CM

## 2021-02-23 DIAGNOSIS — M545 Low back pain, unspecified: Secondary | ICD-10-CM | POA: Diagnosis not present

## 2021-02-23 DIAGNOSIS — I10 Essential (primary) hypertension: Secondary | ICD-10-CM | POA: Diagnosis not present

## 2021-02-23 DIAGNOSIS — H4312 Vitreous hemorrhage, left eye: Secondary | ICD-10-CM

## 2021-02-23 DIAGNOSIS — H33312 Horseshoe tear of retina without detachment, left eye: Secondary | ICD-10-CM | POA: Diagnosis not present

## 2021-02-24 ENCOUNTER — Encounter (INDEPENDENT_AMBULATORY_CARE_PROVIDER_SITE_OTHER): Payer: Self-pay | Admitting: Ophthalmology

## 2021-02-25 DIAGNOSIS — M21161 Varus deformity, not elsewhere classified, right knee: Secondary | ICD-10-CM | POA: Diagnosis not present

## 2021-02-25 DIAGNOSIS — M1711 Unilateral primary osteoarthritis, right knee: Secondary | ICD-10-CM | POA: Diagnosis not present

## 2021-02-25 DIAGNOSIS — Z96651 Presence of right artificial knee joint: Secondary | ICD-10-CM | POA: Diagnosis not present

## 2021-02-25 DIAGNOSIS — G8918 Other acute postprocedural pain: Secondary | ICD-10-CM | POA: Diagnosis not present

## 2021-02-26 DIAGNOSIS — I10 Essential (primary) hypertension: Secondary | ICD-10-CM | POA: Diagnosis not present

## 2021-02-26 DIAGNOSIS — M19042 Primary osteoarthritis, left hand: Secondary | ICD-10-CM | POA: Diagnosis not present

## 2021-02-26 DIAGNOSIS — Z471 Aftercare following joint replacement surgery: Secondary | ICD-10-CM | POA: Diagnosis not present

## 2021-02-26 DIAGNOSIS — Z7982 Long term (current) use of aspirin: Secondary | ICD-10-CM | POA: Diagnosis not present

## 2021-02-26 DIAGNOSIS — Z96653 Presence of artificial knee joint, bilateral: Secondary | ICD-10-CM | POA: Diagnosis not present

## 2021-03-01 DIAGNOSIS — Z471 Aftercare following joint replacement surgery: Secondary | ICD-10-CM | POA: Diagnosis not present

## 2021-03-01 DIAGNOSIS — M19042 Primary osteoarthritis, left hand: Secondary | ICD-10-CM | POA: Diagnosis not present

## 2021-03-01 DIAGNOSIS — I10 Essential (primary) hypertension: Secondary | ICD-10-CM | POA: Diagnosis not present

## 2021-03-01 DIAGNOSIS — Z96653 Presence of artificial knee joint, bilateral: Secondary | ICD-10-CM | POA: Diagnosis not present

## 2021-03-01 DIAGNOSIS — Z7982 Long term (current) use of aspirin: Secondary | ICD-10-CM | POA: Diagnosis not present

## 2021-03-02 DIAGNOSIS — Z96653 Presence of artificial knee joint, bilateral: Secondary | ICD-10-CM | POA: Diagnosis not present

## 2021-03-02 DIAGNOSIS — Z471 Aftercare following joint replacement surgery: Secondary | ICD-10-CM | POA: Diagnosis not present

## 2021-03-02 DIAGNOSIS — Z7982 Long term (current) use of aspirin: Secondary | ICD-10-CM | POA: Diagnosis not present

## 2021-03-02 DIAGNOSIS — M19042 Primary osteoarthritis, left hand: Secondary | ICD-10-CM | POA: Diagnosis not present

## 2021-03-02 DIAGNOSIS — I10 Essential (primary) hypertension: Secondary | ICD-10-CM | POA: Diagnosis not present

## 2021-03-03 DIAGNOSIS — Z96653 Presence of artificial knee joint, bilateral: Secondary | ICD-10-CM | POA: Diagnosis not present

## 2021-03-03 DIAGNOSIS — Z7982 Long term (current) use of aspirin: Secondary | ICD-10-CM | POA: Diagnosis not present

## 2021-03-03 DIAGNOSIS — M19042 Primary osteoarthritis, left hand: Secondary | ICD-10-CM | POA: Diagnosis not present

## 2021-03-03 DIAGNOSIS — Z471 Aftercare following joint replacement surgery: Secondary | ICD-10-CM | POA: Diagnosis not present

## 2021-03-03 DIAGNOSIS — I10 Essential (primary) hypertension: Secondary | ICD-10-CM | POA: Diagnosis not present

## 2021-03-04 DIAGNOSIS — M19042 Primary osteoarthritis, left hand: Secondary | ICD-10-CM | POA: Diagnosis not present

## 2021-03-04 DIAGNOSIS — I10 Essential (primary) hypertension: Secondary | ICD-10-CM | POA: Diagnosis not present

## 2021-03-04 DIAGNOSIS — Z471 Aftercare following joint replacement surgery: Secondary | ICD-10-CM | POA: Diagnosis not present

## 2021-03-04 DIAGNOSIS — Z96653 Presence of artificial knee joint, bilateral: Secondary | ICD-10-CM | POA: Diagnosis not present

## 2021-03-04 DIAGNOSIS — Z7982 Long term (current) use of aspirin: Secondary | ICD-10-CM | POA: Diagnosis not present

## 2021-03-07 ENCOUNTER — Encounter (INDEPENDENT_AMBULATORY_CARE_PROVIDER_SITE_OTHER): Payer: Medicare Other | Admitting: Ophthalmology

## 2021-03-07 DIAGNOSIS — I10 Essential (primary) hypertension: Secondary | ICD-10-CM | POA: Diagnosis not present

## 2021-03-07 DIAGNOSIS — Z471 Aftercare following joint replacement surgery: Secondary | ICD-10-CM | POA: Diagnosis not present

## 2021-03-07 DIAGNOSIS — Z96653 Presence of artificial knee joint, bilateral: Secondary | ICD-10-CM | POA: Diagnosis not present

## 2021-03-07 DIAGNOSIS — Z7982 Long term (current) use of aspirin: Secondary | ICD-10-CM | POA: Diagnosis not present

## 2021-03-07 DIAGNOSIS — M19042 Primary osteoarthritis, left hand: Secondary | ICD-10-CM | POA: Diagnosis not present

## 2021-03-08 DIAGNOSIS — Z96651 Presence of right artificial knee joint: Secondary | ICD-10-CM | POA: Diagnosis not present

## 2021-03-08 DIAGNOSIS — M25661 Stiffness of right knee, not elsewhere classified: Secondary | ICD-10-CM | POA: Diagnosis not present

## 2021-03-08 DIAGNOSIS — M1711 Unilateral primary osteoarthritis, right knee: Secondary | ICD-10-CM | POA: Diagnosis not present

## 2021-03-08 DIAGNOSIS — Z9889 Other specified postprocedural states: Secondary | ICD-10-CM | POA: Diagnosis not present

## 2021-03-10 DIAGNOSIS — M25661 Stiffness of right knee, not elsewhere classified: Secondary | ICD-10-CM | POA: Diagnosis not present

## 2021-03-10 DIAGNOSIS — Z96651 Presence of right artificial knee joint: Secondary | ICD-10-CM | POA: Diagnosis not present

## 2021-03-16 DIAGNOSIS — Z96651 Presence of right artificial knee joint: Secondary | ICD-10-CM | POA: Diagnosis not present

## 2021-03-16 DIAGNOSIS — M25661 Stiffness of right knee, not elsewhere classified: Secondary | ICD-10-CM | POA: Diagnosis not present

## 2021-03-21 DIAGNOSIS — Z96651 Presence of right artificial knee joint: Secondary | ICD-10-CM | POA: Diagnosis not present

## 2021-03-21 DIAGNOSIS — M25661 Stiffness of right knee, not elsewhere classified: Secondary | ICD-10-CM | POA: Diagnosis not present

## 2021-03-23 ENCOUNTER — Encounter (HOSPITAL_BASED_OUTPATIENT_CLINIC_OR_DEPARTMENT_OTHER): Payer: Self-pay | Admitting: Orthopedic Surgery

## 2021-03-23 ENCOUNTER — Other Ambulatory Visit: Payer: Self-pay

## 2021-03-23 DIAGNOSIS — Z96651 Presence of right artificial knee joint: Secondary | ICD-10-CM | POA: Diagnosis not present

## 2021-03-23 DIAGNOSIS — M25661 Stiffness of right knee, not elsewhere classified: Secondary | ICD-10-CM | POA: Diagnosis not present

## 2021-03-25 DIAGNOSIS — Z96651 Presence of right artificial knee joint: Secondary | ICD-10-CM | POA: Diagnosis not present

## 2021-03-25 DIAGNOSIS — M25661 Stiffness of right knee, not elsewhere classified: Secondary | ICD-10-CM | POA: Diagnosis not present

## 2021-03-29 ENCOUNTER — Other Ambulatory Visit (HOSPITAL_BASED_OUTPATIENT_CLINIC_OR_DEPARTMENT_OTHER): Payer: Medicare Other

## 2021-03-30 ENCOUNTER — Encounter (HOSPITAL_BASED_OUTPATIENT_CLINIC_OR_DEPARTMENT_OTHER)
Admission: RE | Admit: 2021-03-30 | Discharge: 2021-03-30 | Disposition: A | Discharge status: Home / Self Care | Payer: Medicare Other | Source: Ambulatory Visit | Attending: Orthopedic Surgery | Admitting: Orthopedic Surgery

## 2021-03-30 DIAGNOSIS — Z79899 Other long term (current) drug therapy: Secondary | ICD-10-CM | POA: Insufficient documentation

## 2021-03-30 DIAGNOSIS — Z01818 Encounter for other preprocedural examination: Secondary | ICD-10-CM | POA: Insufficient documentation

## 2021-03-30 DIAGNOSIS — M25661 Stiffness of right knee, not elsewhere classified: Secondary | ICD-10-CM | POA: Diagnosis not present

## 2021-03-30 DIAGNOSIS — Z96651 Presence of right artificial knee joint: Secondary | ICD-10-CM | POA: Diagnosis not present

## 2021-03-30 LAB — BASIC METABOLIC PANEL
Anion gap: 6 (ref 5–15)
BUN: 33 mg/dL — ABNORMAL HIGH (ref 8–23)
CO2: 25 mmol/L (ref 22–32)
Calcium: 9.7 mg/dL (ref 8.9–10.3)
Chloride: 105 mmol/L (ref 98–111)
Creatinine, Ser: 1.1 mg/dL (ref 0.61–1.24)
GFR, Estimated: >60 mL/min (ref >60)
Glucose, Bld: 154 mg/dL — ABNORMAL HIGH (ref 70–99)
Potassium: 4 mmol/L (ref 3.5–5.1)
Sodium: 136 mmol/L (ref 135–145)

## 2021-03-30 NOTE — Progress Notes (Signed)

## 2021-03-30 NOTE — H&P (Signed)
Walter White. is an 71 y.o. male.   ? ? ?History: ?CC / Reason for Visit:  left index finger ?HPI: This patient returns reevaluation, indicating that he is going to have his knee replaced in a week and a half of Dr. Mayer Camel, and would like to proceed with left index MP arthroplasty may be 6 weeks after his knee replacement.  For now he desires a repeat injection to try to help symptomatically as he recovers from his knee ? ?HPI 01-11-21: This patient returns to clinic today indicating that his left index finger MP joint continues to be quite painful.  He reports that he actually was thinking about moving up to date of surgery on his knee and then following it potentially with surgery on his left index finger MP joint, hoping to have it replaced.  Hoping also today for another injection.  He also indicates that he accidentally hit himself with a hammer in that region which seems to have made it more painful.  He continues to take Celebrex and use topicals. ? ?HPI 10:2022:This patient returns to clinic today to undergo another injection into his left index finger MCP.  He states that he got quite a bit of results out of the last injection that he had on 08/26/2020.  He uses topicals and take Celebrex.  He is having his knee replaced in January by Dr. Mayer Camel.   ? ?HPI 09/28/20:This patient returns to clinic today indicating that the injection into the left index finger MCP joint was quite helpful for a couple of weeks.  He states that he does continue to use his hand but seems to guard his index finger more so due to pain.  He asks when he can have another injection.  He has been using topicals and does take Celebrex daily.   ? ?HPI 08/26/20:This patient returns to clinic today for reevaluation indicating that he feels as though he has made a lot of progress with the right hand in therapy, but the left index finger MP joint remains quite painful.  He notes that he actually extends his digit with gripping and grasping  with his left hand so to avoid pressure.  He comes in today in indicates that he has quite a number of questions in regards to joint replacement of his MP. ? ?HPI 06/03/20:Debbie returns to clinic today indicating that he feels as though the injection into his left index finger MCP was helpful.  He states that he thinks he has little bit more range of motion and is not quite as painful.  He notes that Sydell Axon has told him not to keep the finger in extension but to utilize it in a normal fashion and he has had to pay better attention to that.  Additionally, he states that he does continue to buddy tape his long finger and index finger together on the right side.  There still is an ulnar drift.   ? ?HPI 05/05/20:This patient returns reevaluation, indicating left index finger MP joint is still quite bothersome, despite steroid injection by Dr. Mayer Camel on 03-25-20.  The effect of the injection was modest at best.  The right long finger MCP is improved following the injection that we rendered for it on 04-08-20, and he has worked some with Sydell Axon, and often buddy straps the index and long on the right ? ?HPI 04-08-20: This patient is a 71 year old, right-hand-dominant, retired male who indicates that he has been experiencing pain in his right long finger ever  since an injury that occurred where he jammed his finger in September or October 2021.  He saw Dr. Mayer Camel for this and had an injection in December which seems to help for approximately a month.  He has noticed that it is become more and more painful.  Additionally, his left index finger also has arthritic changes and has been more painful recently.  He received an injection into his MCP joint of his left index finger on 03/25/2020 and indicated that it was not all that helpful as of yet.  He also notes that he has taken himself off of Celebrex in recent weeks and has noticed an increase of arthritic type pain.  He is here today for further evaluation and treatment. ? ? ?Past  Medical History:  ?Diagnosis Date  ? AAA (abdominal aortic aneurysm)   ? 4.0 cm AAA 08/22/17 U/S  ? Allergy   ? Allergic Rhinitis  ? Anemia   ? Arthritis   ? spinal steniosis  ? Benign prostate hyperplasia   ? with lower urinary tract symptoms  ? Carpal tunnel syndrome on right   ? Cataracts, both eyes   ? Chronic back pain   ? caused by spinal stenosis  ? Elevated PSA   ? GERD (gastroesophageal reflux disease)   ? Hyperlipidemia   ? Hypertension   ? Essential.    ? MGUS (monoclonal gammopathy of unknown significance)   ? Microhematuria   ? OA (osteoarthritis) of knee   ? Pneumonia   ? Hx -when very young  ? Pre-diabetes   ? Seasonal allergies   ? Spinal stenosis   ? of lumbosacral region.  Causes chronic back pain.  ? Upper abdominal pain   ? ? ?Past Surgical History:  ?Procedure Laterality Date  ? ABDOMINAL EXPOSURE N/A 09/05/2017  ? Procedure: ABDOMINAL EXPOSURE;  Surgeon: Rosetta Posner, MD;  Location: [facility];  Service: Vascular;  Laterality: N/A;  ? ANTERIOR LAT LUMBAR FUSION Left 01/26/2016  ? Procedure: LEFT SIDED LATERAL INTERBODY FUSION, LUMBAR 3-4 WITH INSTRUMENTATION AND ALLOGRAFT;  Surgeon: Phylliss Bob, MD;  Location: [place];  Service: Orthopedics;  Laterality: Left;  LEFT SIDED LATERAL INTERBODY FUSION, LUMBAR 3-4 WITH INSTRUMENTATION AND ALLOGRAFT  ? ANTERIOR LAT LUMBAR FUSION Right 08/14/2018  ? Procedure: RIGHT LUMBAR two to LUMBAR three LATERAL INTERBODY FUSION WITH INSTRUMENTATION AND ALLOGRAFT;  Surgeon: Phylliss Bob, MD;  Location: [place];  Service: Orthopedics;  Laterality: Right;  ? ANTERIOR LATERAL LUMBAR FUSION WITH PERCUTANEOUS SCREW 1 LEVEL N/A 09/05/2017  ? Procedure: LUMBAR 5 - SACRUM 1 ANTERIOR LUMBAR INTERBODY FUSION WITH INSTRUMENTATION AND ALLOGRAFT;  Surgeon: Phylliss Bob, MD;  Location: [place];  Service: Orthopedics;  Laterality: N/A;  ? BACK SURGERY  01/2015  ? dr. Lynann Bologna. [phone].  ? CARPAL TUNNEL RELEASE    ? right   ? CATARACT EXTRACTION, BILATERAL    ? COLONOSCOPY    ?  COLONOSCOPY  [id number]  ? polyp  ? ELBOW SURGERY    ? right ulnar release  ? ELBOW SURGERY    ? LEFT CTR  ? EYE SURGERY    ? JOINT REPLACEMENT    ? left- Sept 2016  ? KNEE ARTHROSCOPY  11/23/2010  ? Procedure: ARTHROSCOPY KNEE;  Surgeon: Kerin Salen;  Location: [facility];  Service: Orthopedics;  Laterality: Left;  arthroscopy left knee  ? KNEE ARTHROSCOPY    ? left x 2  ? Knee scar tissue removal Left 2018  ? LATERAL FUSION LUMBAR SPINE  01./17/2018  ? polypectomy  2010  ? SHOULDER ARTHROSCOPY Left   ? x2  ? TONSILLECTOMY    ? TOTAL KNEE ARTHROPLASTY Left 09/21/2014  ? Procedure: TOTAL KNEE ARTHROPLASTY;  Surgeon: Frederik Pear, MD;  Location: [facility];  Service: Orthopedics;  Laterality: Left;  ? ULNAR NERVE TRANSPOSITION Left 04/16/2017  ? Procedure: LEFT ULNAR NEUROPLASTY AT THE WRIST AND TRANSPOSITION AT THE ELBOW;  Surgeon: Milly Jakob, MD;  Location: [facility];  Service: Orthopedics;  Laterality: Left;  ? WISDOM TOOTH EXTRACTION    ? ? ?Family History  ?Problem Relation Age of Onset  ? Cancer Mother   ? Liver disease Father   ? Colon cancer Maternal Uncle 9  ? Colon polyps Neg Hx   ? Esophageal cancer Neg Hx   ? Rectal cancer Neg Hx   ? Stomach cancer Neg Hx   ? ?Social History:  reports that he quit smoking about 40 years ago. His smoking use included cigarettes. He has never used smokeless tobacco. He reports current alcohol use. He reports that he does not use drugs. ? ?Allergies:  ?Allergies  ?Allergen Reactions  ? Pork-Derived Products Shortness Of Breath  ? Shellfish Allergy Shortness Of Breath  ? Lovastatin Other (See Comments)  ?  Bone pain   ? Penicillins   ?  UNSPECIFIED CHILDHOOD REACTION  ?Has patient had a PCN reaction causing immediate rash, facial/tongue/throat swelling, SOB or lightheadedness with hypotension: Unknown ?Has patient had a PCN reaction causing severe rash involving mucus membranes or skin necrosis: Unknown ?Has patient had a PCN reaction that  required hospitalization: Unknown ?Has patient had a PCN reaction occurring within the last 10 years: No ?If all of the above answers are "NO", then may proceed with Cephalosporin use. ?  ? ? ?No medications prior to admi

## 2021-03-31 DIAGNOSIS — Z9889 Other specified postprocedural states: Secondary | ICD-10-CM | POA: Diagnosis not present

## 2021-03-31 DIAGNOSIS — M1711 Unilateral primary osteoarthritis, right knee: Secondary | ICD-10-CM | POA: Diagnosis not present

## 2021-04-01 DIAGNOSIS — Z96651 Presence of right artificial knee joint: Secondary | ICD-10-CM | POA: Diagnosis not present

## 2021-04-01 DIAGNOSIS — M25661 Stiffness of right knee, not elsewhere classified: Secondary | ICD-10-CM | POA: Diagnosis not present

## 2021-04-03 NOTE — Anesthesia Preprocedure Evaluation (Addendum)
Anesthesia Evaluation  ?Patient identified by MRN, date of birth, ID band ?Patient awake ? ? ? ?Reviewed: ?Allergy & Precautions, NPO status , Patient's Chart, lab work & pertinent test results ? ?History of Anesthesia Complications ?Negative for: history of anesthetic complications ? ?Airway ?Mallampati: II ? ?TM Distance: >3 FB ?Neck ROM: Full ? ? ? Dental ?  ?Pulmonary ?neg pulmonary ROS, former smoker,  ?  ?Pulmonary exam normal ? ? ? ? ? ? ? Cardiovascular ?hypertension, Pt. on medications ?Normal cardiovascular exam ? ?AAA ?  ?Neuro/Psych ?negative neurological ROS ?   ? GI/Hepatic ?Neg liver ROS, GERD  ,  ?Endo/Other  ?diabetes, Type 2 ? Renal/GU ?negative Renal ROS  ?negative genitourinary ?  ?Musculoskeletal ? ?(+) Arthritis ,  ? Abdominal ?  ?Peds ? Hematology ?negative hematology ROS ?(+)   ?Anesthesia Other Findings ? ? Reproductive/Obstetrics ? ?  ? ? ? ? ? ? ? ? ? ? ? ? ? ?  ?  ? ? ? ? ? ? ? ?Anesthesia Physical ?Anesthesia Plan ? ?ASA: 3 ? ?Anesthesia Plan: MAC and Regional  ? ?Post-op Pain Management: Regional block* and Tylenol PO (pre-op)*  ? ?Induction: Intravenous ? ?PONV Risk Score and Plan: 1 and Propofol infusion, TIVA and Treatment may vary due to age or medical condition ? ?Airway Management Planned: Natural Airway, Nasal Cannula and Simple Face Mask ? ?Additional Equipment: None ? ?Intra-op Plan:  ? ?Post-operative Plan:  ? ?Informed Consent: I have reviewed the patients History and Physical, chart, labs and discussed the procedure including the risks, benefits and alternatives for the proposed anesthesia with the patient or authorized representative who has indicated his/her understanding and acceptance.  ? ? ? ? ? ?Plan Discussed with:  ? ?Anesthesia Plan Comments:   ? ? ? ? ? ?Anesthesia Quick Evaluation ? ?

## 2021-04-04 ENCOUNTER — Ambulatory Visit (HOSPITAL_BASED_OUTPATIENT_CLINIC_OR_DEPARTMENT_OTHER): Payer: Medicare Other | Admitting: Anesthesiology

## 2021-04-04 ENCOUNTER — Ambulatory Visit (HOSPITAL_BASED_OUTPATIENT_CLINIC_OR_DEPARTMENT_OTHER)
Admission: RE | Admit: 2021-04-04 | Discharge: 2021-04-04 | Disposition: A | Discharge status: Home / Self Care | Payer: Medicare Other | Attending: Orthopedic Surgery | Admitting: Orthopedic Surgery

## 2021-04-04 ENCOUNTER — Encounter (HOSPITAL_BASED_OUTPATIENT_CLINIC_OR_DEPARTMENT_OTHER)
Admission: RE | Disposition: A | Discharge status: Home / Self Care | Payer: Self-pay | Source: Home / Self Care | Attending: Orthopedic Surgery

## 2021-04-04 ENCOUNTER — Other Ambulatory Visit: Payer: Self-pay

## 2021-04-04 ENCOUNTER — Ambulatory Visit (HOSPITAL_COMMUNITY): Payer: Medicare Other

## 2021-04-04 ENCOUNTER — Encounter (HOSPITAL_BASED_OUTPATIENT_CLINIC_OR_DEPARTMENT_OTHER): Payer: Self-pay | Admitting: Orthopedic Surgery

## 2021-04-04 DIAGNOSIS — E119 Type 2 diabetes mellitus without complications: Secondary | ICD-10-CM | POA: Insufficient documentation

## 2021-04-04 DIAGNOSIS — Z79899 Other long term (current) drug therapy: Secondary | ICD-10-CM | POA: Diagnosis not present

## 2021-04-04 DIAGNOSIS — S63652A Sprain of metacarpophalangeal joint of right middle finger, initial encounter: Secondary | ICD-10-CM | POA: Diagnosis not present

## 2021-04-04 DIAGNOSIS — Z87891 Personal history of nicotine dependence: Secondary | ICD-10-CM | POA: Diagnosis not present

## 2021-04-04 DIAGNOSIS — W230XXA Caught, crushed, jammed, or pinched between moving objects, initial encounter: Secondary | ICD-10-CM | POA: Diagnosis not present

## 2021-04-04 DIAGNOSIS — M19049 Primary osteoarthritis, unspecified hand: Secondary | ICD-10-CM | POA: Diagnosis not present

## 2021-04-04 DIAGNOSIS — M19042 Primary osteoarthritis, left hand: Secondary | ICD-10-CM | POA: Diagnosis not present

## 2021-04-04 DIAGNOSIS — I10 Essential (primary) hypertension: Secondary | ICD-10-CM | POA: Insufficient documentation

## 2021-04-04 DIAGNOSIS — Z9889 Other specified postprocedural states: Secondary | ICD-10-CM | POA: Diagnosis not present

## 2021-04-04 DIAGNOSIS — M19041 Primary osteoarthritis, right hand: Secondary | ICD-10-CM | POA: Diagnosis not present

## 2021-04-04 HISTORY — PX: FINGER ARTHROPLASTY: SHX5017

## 2021-04-04 SURGERY — ARTHROPLASTY, FINGER
Anesthesia: Monitor Anesthesia Care | Site: Finger | Laterality: Left

## 2021-04-04 MED ORDER — OXYCODONE HCL 5 MG PO TABS: 5.0000 mg | ORAL_TABLET | Freq: Once | ORAL | Status: DC | PRN

## 2021-04-04 MED ORDER — ACETAMINOPHEN 500 MG PO TABS
ORAL_TABLET | ORAL | Status: AC
  Filled 2021-04-04: qty 2

## 2021-04-04 MED ORDER — ONDANSETRON HCL 4 MG/2ML IJ SOLN: 4.0000 mg | Freq: Once | INTRAMUSCULAR | Status: DC | PRN

## 2021-04-04 MED ORDER — FENTANYL CITRATE (PF) 100 MCG/2ML IJ SOLN: 25.0000 ug | INTRAMUSCULAR | Status: DC | PRN

## 2021-04-04 MED ORDER — PROPOFOL 500 MG/50ML IV EMUL
INTRAVENOUS | Status: DC | PRN
  Administered 2021-04-04: 75 ug/kg/min via INTRAVENOUS

## 2021-04-04 MED ORDER — ACETAMINOPHEN 325 MG PO TABS: 325.0000 mg | ORAL_TABLET | Freq: Four times a day (QID) | ORAL | Status: DC

## 2021-04-04 MED ORDER — OXYCODONE HCL 5 MG/5ML PO SOLN: 5.0000 mg | Freq: Once | ORAL | Status: DC | PRN

## 2021-04-04 MED ORDER — MIDAZOLAM HCL 2 MG/2ML IJ SOLN
2.0000 mg | Freq: Once | INTRAMUSCULAR | Status: AC
  Administered 2021-04-04: 1 mg via INTRAVENOUS

## 2021-04-04 MED ORDER — ONDANSETRON HCL 4 MG/2ML IJ SOLN
INTRAMUSCULAR | Status: DC | PRN
  Administered 2021-04-04: 4 mg via INTRAVENOUS

## 2021-04-04 MED ORDER — LIDOCAINE HCL 2 % IJ SOLN
INTRAMUSCULAR | Status: AC
  Filled 2021-04-04: qty 20

## 2021-04-04 MED ORDER — EPHEDRINE SULFATE (PRESSORS) 50 MG/ML IJ SOLN
INTRAMUSCULAR | Status: DC | PRN
  Administered 2021-04-04 (×2): 5 mg via INTRAVENOUS

## 2021-04-04 MED ORDER — MIDAZOLAM HCL 2 MG/2ML IJ SOLN
INTRAMUSCULAR | Status: AC
  Filled 2021-04-04: qty 2

## 2021-04-04 MED ORDER — CLINDAMYCIN PHOSPHATE 900 MG/50ML IV SOLN
900.0000 mg | INTRAVENOUS | Status: AC
  Administered 2021-04-04: 900 mg via INTRAVENOUS

## 2021-04-04 MED ORDER — 0.9 % SODIUM CHLORIDE (POUR BTL) OPTIME
TOPICAL | Status: DC | PRN
  Administered 2021-04-04: 100 mL

## 2021-04-04 MED ORDER — BUPIVACAINE-EPINEPHRINE (PF) 0.5% -1:200000 IJ SOLN
INTRAMUSCULAR | Status: AC
  Filled 2021-04-04: qty 30

## 2021-04-04 MED ORDER — FENTANYL CITRATE (PF) 100 MCG/2ML IJ SOLN
INTRAMUSCULAR | Status: AC
  Filled 2021-04-04: qty 2

## 2021-04-04 MED ORDER — CLINDAMYCIN PHOSPHATE 900 MG/50ML IV SOLN
INTRAVENOUS | Status: AC
  Filled 2021-04-04: qty 50

## 2021-04-04 MED ORDER — ACETAMINOPHEN 500 MG PO TABS
1000.0000 mg | ORAL_TABLET | Freq: Once | ORAL | Status: AC
  Administered 2021-04-04: 1000 mg via ORAL

## 2021-04-04 MED ORDER — LACTATED RINGERS IV SOLN: INTRAVENOUS | Status: DC

## 2021-04-04 MED ORDER — LIDOCAINE 2% (20 MG/ML) 5 ML SYRINGE
INTRAMUSCULAR | Status: DC | PRN
Start: 2021-04-04 — End: 2021-04-04
  Administered 2021-04-04: 30 mg via INTRAVENOUS

## 2021-04-04 MED ORDER — ROPIVACAINE HCL 5 MG/ML IJ SOLN
INTRAMUSCULAR | Status: DC | PRN
Start: 2021-04-04 — End: 2021-04-04
  Administered 2021-04-04: 30 mL via PERINEURAL

## 2021-04-04 MED ORDER — AMISULPRIDE (ANTIEMETIC) 5 MG/2ML IV SOLN: 10.0000 mg | Freq: Once | INTRAVENOUS | Status: DC | PRN

## 2021-04-04 MED ORDER — FENTANYL CITRATE (PF) 100 MCG/2ML IJ SOLN
100.0000 ug | Freq: Once | INTRAMUSCULAR | Status: AC
  Administered 2021-04-04: 50 ug via INTRAVENOUS

## 2021-04-04 SURGICAL SUPPLY — 57 items
APL PRP STRL LF DISP 70% ISPRP (MISCELLANEOUS) ×1
ASCENSION MCP SIDE BUR ×1 IMPLANT
BLADE MINI RND TIP GREEN BEAV (BLADE) IMPLANT
BLADE OSC/SAG .038X5.5 CUT EDG (BLADE) ×2 IMPLANT
BLADE SURG 15 STRL LF DISP TIS (BLADE) ×1 IMPLANT
BLADE SURG 15 STRL SS (BLADE) ×2
BNDG CMPR 9X4 STRL LF SNTH (GAUZE/BANDAGES/DRESSINGS)
BNDG COHESIVE 4X5 TAN ST LF (GAUZE/BANDAGES/DRESSINGS) ×2 IMPLANT
BNDG ESMARK 4X9 LF (GAUZE/BANDAGES/DRESSINGS) IMPLANT
BNDG GAUZE ELAST 4 BULKY (GAUZE/BANDAGES/DRESSINGS) ×2 IMPLANT
BUR EGG 3PK/BX (BURR) IMPLANT
CHLORAPREP W/TINT 26 (MISCELLANEOUS) ×2 IMPLANT
CORD BIPOLAR FORCEPS 12FT (ELECTRODE) ×2 IMPLANT
COVER BACK TABLE 60X90IN (DRAPES) ×2 IMPLANT
COVER MAYO STAND STRL (DRAPES) ×2 IMPLANT
DRAPE C-ARM 42X72 X-RAY (DRAPES) ×2 IMPLANT
DRAPE EXTREMITY T 121X128X90 (DISPOSABLE) ×2 IMPLANT
DRAPE SURG 17X23 STRL (DRAPES) ×2 IMPLANT
DRSG EMULSION OIL 3X3 NADH (GAUZE/BANDAGES/DRESSINGS) ×2 IMPLANT
GAUZE SPONGE 4X4 12PLY STRL LF (GAUZE/BANDAGES/DRESSINGS) ×2 IMPLANT
GLOVE SRG 8 PF TXTR STRL LF DI (GLOVE) ×1 IMPLANT
GLOVE SURG ENC MOIS LTX SZ7.5 (GLOVE) ×2 IMPLANT
GLOVE SURG LTX SZ6.5 (GLOVE) ×2 IMPLANT
GLOVE SURG UNDER POLY LF SZ7 (GLOVE) ×2 IMPLANT
GLOVE SURG UNDER POLY LF SZ8 (GLOVE) ×2
GOWN STRL REUS W/ TWL LRG LVL3 (GOWN DISPOSABLE) ×2 IMPLANT
GOWN STRL REUS W/TWL LRG LVL3 (GOWN DISPOSABLE) ×4
GOWN STRL REUS W/TWL XL LVL3 (GOWN DISPOSABLE) ×2 IMPLANT
IMPL MCP DISTAL 30 (Orthopedic Implant) IMPLANT
IMPL PROXIMAL MCP 30 (Orthopedic Implant) IMPLANT
IMPLANT MCP DISTAL 30 (Orthopedic Implant) ×2 IMPLANT
IMPLANT PROXIMAL MCP 30 (Orthopedic Implant) ×2 IMPLANT
NS IRRIG 1000ML POUR BTL (IV SOLUTION) ×2 IMPLANT
PACK BASIN DAY SURGERY FS (CUSTOM PROCEDURE TRAY) ×2 IMPLANT
PADDING CAST ABS 3INX4YD NS (CAST SUPPLIES)
PADDING CAST ABS 4INX4YD NS (CAST SUPPLIES) ×1
PADDING CAST ABS COTTON 3X4 (CAST SUPPLIES) IMPLANT
PADDING CAST ABS COTTON 4X4 ST (CAST SUPPLIES) ×1 IMPLANT
SLEEVE SCD COMPRESS KNEE MED (STOCKING) ×1 IMPLANT
SLING ARM FOAM STRAP LRG (SOFTGOODS) ×1 IMPLANT
SPLINT PLASTER CAST XFAST 3X15 (CAST SUPPLIES) IMPLANT
SPLINT PLASTER CAST XFAST 4X15 (CAST SUPPLIES) ×1 IMPLANT
SPLINT PLASTER XTRA FAST SET 4 (CAST SUPPLIES) ×1
SPLINT PLASTER XTRA FASTSET 3X (CAST SUPPLIES)
STOCKINETTE 6  STRL (DRAPES) ×1
STOCKINETTE 6 STRL (DRAPES) ×1 IMPLANT
SUT FIBERWIRE 2-0 18 17.9 3/8 (SUTURE)
SUT SILK 4 0 P 3 (SUTURE) IMPLANT
SUT VIC AB 3-0 SH 27 (SUTURE) ×2
SUT VIC AB 3-0 SH 27X BRD (SUTURE) IMPLANT
SUT VICRYL 4-0 PS2 18IN ABS (SUTURE) ×2 IMPLANT
SUT VICRYL RAPIDE 4-0 (SUTURE) IMPLANT
SUT VICRYL RAPIDE 4/0 PS 2 (SUTURE) ×1 IMPLANT
SUTURE FIBERWR 2-0 18 17.9 3/8 (SUTURE) IMPLANT
SYR BULB EAR ULCER 3OZ GRN STR (SYRINGE) ×2 IMPLANT
TOWEL GREEN STERILE FF (TOWEL DISPOSABLE) ×2 IMPLANT
UNDERPAD 30X36 HEAVY ABSORB (UNDERPADS AND DIAPERS) ×2 IMPLANT

## 2021-04-04 NOTE — Transfer of Care (Signed)
Immediate Anesthesia Transfer of Care Note ? ?Patient: Jie Stickels. ? ?Procedure(s) Performed: LEFT INDEX FINGER IMPLANT ARTHROPLASTY AT METACARPOPHALANGEAL JOINT (Left: Finger) ? ?Patient Location: PACU ? ?Anesthesia Type:MAC combined with regional for post-op pain ? ?Level of Consciousness: awake, alert , oriented, drowsy and patient cooperative ? ?Airway & Oxygen Therapy: Patient Spontanous Breathing and Patient connected to face mask oxygen ? ?Post-op Assessment: Report given to RN and Post -op Vital signs reviewed and stable ? ?Post vital signs: Reviewed and stable ? ?Last Vitals:  ?Vitals Value Taken Time  ?BP    ?Temp    ?Pulse 64 04/04/21 0846  ?Resp 21 04/04/21 0846  ?SpO2 96 % 04/04/21 0846  ?Vitals shown include unvalidated device data. ? ?Last Pain:  ?Vitals:  ? 04/04/21 0655  ?TempSrc: Oral  ?PainSc: 0-No pain  ?   ? ?Patients Stated Pain Goal: 5 (04/04/21 5974) ? ?Complications: No notable events documented. ?

## 2021-04-04 NOTE — Op Note (Signed)
04/04/2021 ? ?7:28 AM ? ?PATIENT:  Walter White.  71 y.o. male ? 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 ?POST-OPERATIVE DIAGNOSIS:  Same ? ?PROCEDURE:  L IF MCP pyrocarbon implant arthroplasty ? ?SURGEON: Walter Char. Grandville Silos, MD ? ?PHYSICIAN ASSISTANT: none ? ?ANESTHESIA:  regional and MAC ? ?SPECIMENS:  None ? ?DRAINS:   None ? ?EBL:  Minimal ? ?PREOPERATIVE INDICATIONS:  Walter White. is a  71 y.o. male with painful left index finger MCP osteoarthritis that has been transiently responsive to corticosteroid injection.  He desired to proceed with implant arthroplasty. ? ?The risks benefits and alternatives were discussed with the patient preoperatively including but not limited to the risks of infection, bleeding, nerve injury, cardiopulmonary complications, the need for revision surgery, among others, and the patient verbalized understanding and consented to proceed. ? ?OPERATIVE IMPLANTS: Integra pyrocarbon MCP implants, size 30 proximally and size 30 distally ? ?OPERATIVE PROCEDURE:  After receiving prophylactic antibiotics and a regional block, the patient was escorted to the operative theatre and placed in a supine position.  A surgical ?time-out? was performed during which the planned procedure, proposed operative site, and the correct patient identity were compared to the operative consent and agreement confirmed by the circulating nurse according to current facility policy.  Following application of a tourniquet to the operative extremity, the exposed skin was prepped with Chloraprep and draped in the usual sterile fashion.  The limb was exsanguinated with an Esmarch bandage and the tourniquet inflated to approximately 175mHg higher than systolic BP. ? ?A slightly curvilinear incision was fashioned dorsally centered at the MCP joint.  It was made sharply with a scalpel and full-thickness flaps were elevated.  The extensor apparatus was divided in the midline between the 2  contributions of the extensor tendons and the retinaculum along with the sagittal bands reflected radially and ulnarly.  The capsule was then incised in the dorsal midline and reflected radially and ulnarly, preserving the collateral ligaments.  The joint was inspected and found to be sufficiently arthritic to proceed.  Using fluoroscopic guidance, the starting point for the awl was ascertained proximally in the surface perforated.  The sound was then inserted with the proximal cutting jig applied.  The initial cut was made 1 to 2 mm distal to the dorsal insertion of the collaterals.  The cutting jig was removed and the cut completed.  The same process was used for the base of the proximal phalanx.  Once the resection of the 2 portions of the joint were made, we proceeded with broaching, again using fluoroscopic guidance to confirm positioning, for starting with the proximal phalanx and broaching up to a size 30 which was a good fit and fill.  We will transition to the metacarpal and broached up to a size 30 which similarly had good fit and fill.  Trial implants were placed in the tension of the restraining soft tissue structures etc. were found to be appropriate.  The trials were removed and the wound was copiously irrigated.  The implants were placed and impacted slightly.  Appropriateness of fit and stability was again assessed.  Final images were obtained and the capsule was repaired with 3-0 Vicryl running suture, and on the radial side one of the stitches that came through the collaterals at the insertion of the proximal phalanx was also brought transosseous LTruman Haywardthrough the phalanx to help reinforce radial collateral stability to this joint in particular.  The retinaculum was repaired with the same  suture type.  The wound was irrigated the tourniquet released and additional hemostasis obtained with bipolar electrocautery.  The skin was reapproximated with a running 4-0 Vicryl Rapide horizontal mattress suture  and a short arm splint dressing was placed with the wrist in neutral, the MP joints extended, and the IP joints allowed to flex.  He was taken to recovery in stable condition. ? ?DISPOSITION: He will be discharged home today with typical instructions, returning next week for reevaluation with a follow-on hand therapy appointment for custom splint fabrication and initiation of rehab ? ? ? ? ? ? ? ? ? ?

## 2021-04-04 NOTE — Interval H&P Note (Signed)
History and Physical Interval Note: ? ?04/04/2021 ?7:28 AM ? ?Walter White.  has presented today for surgery, with the diagnosis of LEFT INDEX FINGER ARTHRITIS.  The various methods of treatment have been discussed with the patient and family. After consideration of risks, benefits and other options for treatment, the patient has consented to  Procedure(s) with comments: ?LEFT INDEX FINGER IMPLANT ARTHROPLASTY AT METACARPOPHALANGEAL JOINT (Left) - PRE-OP BLOCK, as a surgical intervention.  The patient's history has been reviewed, patient examined, no change in status, stable for surgery.  I have reviewed the patient's chart and labs.  Questions were answered to the patient's satisfaction.   ? ? ?[name] ? ? ?

## 2021-04-04 NOTE — Discharge Instructions (Signed)

## 2021-04-04 NOTE — Anesthesia Procedure Notes (Signed)
Anesthesia Regional Block: Supraclavicular block  ? ?Pre-Anesthetic Checklist: , timeout performed,  Correct Patient, Correct Site, Correct Laterality,  Correct Procedure, Correct Position, site marked,  Risks and benefits discussed,  Surgical consent,  Pre-op evaluation,  At surgeon's request and post-op pain management ? ?Laterality: Left ? ?Prep: chloraprep     ?  ?Needles:  ?Injection technique: Single-shot ? ?Needle Type: Echogenic Stimulator Needle   ? ? ?Needle Length: 10cm  ?Needle Gauge: 20  ? ? ? ?Additional Needles: ? ? ?Procedures:,,,, ultrasound used (permanent image in chart),,    ?Narrative:  ?Start time: 04/04/2021 7:12 AM ?End time: 04/04/2021 7:16 AM ?Injection made incrementally with aspirations every 5 mL. ? ?Performed by: Personally  ?Anesthesiologist: Lidia Collum, MD ? ?Additional Notes: ?Standard monitors applied. Skin prepped. Good needle visualization with ultrasound. Injection made in 5cc increments with no resistance to injection. Patient tolerated the procedure well. ? ? ? ? ?

## 2021-04-04 NOTE — Progress Notes (Signed)
Assisted Dr. Witman with left, supraclavicular, ultrasound guided block. Side rails up, monitors on throughout procedure. See vital signs in flow sheet. Tolerated Procedure well. 

## 2021-04-04 NOTE — Anesthesia Postprocedure Evaluation (Signed)
Anesthesia Post Note ? ?Patient: Pheng Prokop. ? ?Procedure(s) Performed: LEFT INDEX FINGER IMPLANT ARTHROPLASTY AT METACARPOPHALANGEAL JOINT (Left: Finger) ? ?  ? ?Patient location during evaluation: PACU ?Anesthesia Type: Regional ?Level of consciousness: awake and alert ?Pain management: pain level controlled ?Vital Signs Assessment: post-procedure vital signs reviewed and stable ?Respiratory status: spontaneous breathing, nonlabored ventilation and respiratory function stable ?Cardiovascular status: blood pressure returned to baseline and stable ?Postop Assessment: no apparent nausea or vomiting ?Anesthetic complications: no ? ? ?No notable events documented. ? ?Last Vitals:  ?Vitals:  ? 04/04/21 0847 04/04/21 0900  ?BP:  139/63  ?Pulse: 62 61  ?Resp: 17 16  ?Temp: 36.5 ?C   ?SpO2: 96% 99%  ?  ?Last Pain:  ?Vitals:  ? 04/04/21 0915  ?TempSrc:   ?PainSc: 0-No pain  ? ? ?  ?  ?  ?  ?  ?  ? ?Lidia Collum ? ? ? ? ?

## 2021-04-04 NOTE — Anesthesia Procedure Notes (Signed)
Procedure Name: [place] ?Date/Time: 04/04/2021 7:50 AM ?Performed by: Signe Colt, CRNA ?Pre-anesthesia Checklist: Patient identified, Emergency Drugs available, Suction available, Patient being monitored and Timeout performed ?Patient Re-evaluated:Patient Re-evaluated prior to induction ?Oxygen Delivery Method: Simple face mask ? ? ? ? ?

## 2021-04-05 ENCOUNTER — Encounter (HOSPITAL_BASED_OUTPATIENT_CLINIC_OR_DEPARTMENT_OTHER): Payer: Self-pay | Admitting: Orthopedic Surgery

## 2021-04-07 DIAGNOSIS — Z96651 Presence of right artificial knee joint: Secondary | ICD-10-CM | POA: Diagnosis not present

## 2021-04-07 DIAGNOSIS — M25661 Stiffness of right knee, not elsewhere classified: Secondary | ICD-10-CM | POA: Diagnosis not present

## 2021-04-12 DIAGNOSIS — M19042 Primary osteoarthritis, left hand: Secondary | ICD-10-CM | POA: Diagnosis not present

## 2021-04-12 DIAGNOSIS — S63211D Subluxation of metacarpophalangeal joint of left index finger, subsequent encounter: Secondary | ICD-10-CM | POA: Diagnosis not present

## 2021-04-12 DIAGNOSIS — M19049 Primary osteoarthritis, unspecified hand: Secondary | ICD-10-CM | POA: Diagnosis not present

## 2021-04-12 DIAGNOSIS — Z9889 Other specified postprocedural states: Secondary | ICD-10-CM | POA: Diagnosis not present

## 2021-04-12 NOTE — Patient Outreach (Signed)
[facility]) Care Management ? ?04/12/2021 ? ?Janene Harvey. ?1950-04-11 ?[id number] ? ? ?Received referral for Care Management from Insurance plan. Assigned patient to Joellyn Quails, RN Care Coordinator for follow up. ? ? ?Philmore Pali ?[place] Management Assistant ?[phone] ? ?

## 2021-04-13 DIAGNOSIS — M25661 Stiffness of right knee, not elsewhere classified: Secondary | ICD-10-CM | POA: Diagnosis not present

## 2021-04-13 DIAGNOSIS — Z96651 Presence of right artificial knee joint: Secondary | ICD-10-CM | POA: Diagnosis not present

## 2021-04-14 ENCOUNTER — Other Ambulatory Visit: Payer: Medicare Other

## 2021-04-19 ENCOUNTER — Other Ambulatory Visit: Payer: Self-pay | Admitting: *Deleted

## 2021-04-19 DIAGNOSIS — Z96651 Presence of right artificial knee joint: Secondary | ICD-10-CM | POA: Diagnosis not present

## 2021-04-19 DIAGNOSIS — M25661 Stiffness of right knee, not elsewhere classified: Secondary | ICD-10-CM | POA: Diagnosis not present

## 2021-04-19 NOTE — Patient Outreach (Signed)
[facility]) Care Management ? ?04/19/2021 ? ?Janene Harvey. ?1950/06/02 ?[id number] ? ? ?[place] coordination with case closure  ? ?Mr Walter White was referred to [facility] for screening for [facility] RN CM services but has an external CM vendor ? ?Plan case closure- external CM vendor  ? ? ?Judyth Demarais L. Lavina Hamman, RN, BSN, CCM ?[place] Management Care Coordinator ?Office number [phone] ? ?

## 2021-04-20 DIAGNOSIS — S63211D Subluxation of metacarpophalangeal joint of left index finger, subsequent encounter: Secondary | ICD-10-CM | POA: Diagnosis not present

## 2021-04-20 DIAGNOSIS — M19042 Primary osteoarthritis, left hand: Secondary | ICD-10-CM | POA: Diagnosis not present

## 2021-04-21 ENCOUNTER — Ambulatory Visit: Payer: Medicare Other | Admitting: Hematology and Oncology

## 2021-04-21 DIAGNOSIS — M19042 Primary osteoarthritis, left hand: Secondary | ICD-10-CM | POA: Diagnosis not present

## 2021-04-21 DIAGNOSIS — S63211D Subluxation of metacarpophalangeal joint of left index finger, subsequent encounter: Secondary | ICD-10-CM | POA: Diagnosis not present

## 2021-04-26 DIAGNOSIS — M25661 Stiffness of right knee, not elsewhere classified: Secondary | ICD-10-CM | POA: Diagnosis not present

## 2021-04-26 DIAGNOSIS — Z96651 Presence of right artificial knee joint: Secondary | ICD-10-CM | POA: Diagnosis not present

## 2021-04-27 DIAGNOSIS — S63211D Subluxation of metacarpophalangeal joint of left index finger, subsequent encounter: Secondary | ICD-10-CM | POA: Diagnosis not present

## 2021-04-27 DIAGNOSIS — M19042 Primary osteoarthritis, left hand: Secondary | ICD-10-CM | POA: Diagnosis not present

## 2021-04-28 DIAGNOSIS — S63211D Subluxation of metacarpophalangeal joint of left index finger, subsequent encounter: Secondary | ICD-10-CM | POA: Diagnosis not present

## 2021-04-28 DIAGNOSIS — M19042 Primary osteoarthritis, left hand: Secondary | ICD-10-CM | POA: Diagnosis not present

## 2021-04-29 DIAGNOSIS — Z96651 Presence of right artificial knee joint: Secondary | ICD-10-CM | POA: Diagnosis not present

## 2021-04-29 DIAGNOSIS — M25661 Stiffness of right knee, not elsewhere classified: Secondary | ICD-10-CM | POA: Diagnosis not present

## 2021-05-03 DIAGNOSIS — E1122 Type 2 diabetes mellitus with diabetic chronic kidney disease: Secondary | ICD-10-CM | POA: Diagnosis not present

## 2021-05-03 DIAGNOSIS — Z125 Encounter for screening for malignant neoplasm of prostate: Secondary | ICD-10-CM | POA: Diagnosis not present

## 2021-05-03 DIAGNOSIS — I10 Essential (primary) hypertension: Secondary | ICD-10-CM | POA: Diagnosis not present

## 2021-05-03 DIAGNOSIS — E785 Hyperlipidemia, unspecified: Secondary | ICD-10-CM | POA: Diagnosis not present

## 2021-05-06 DIAGNOSIS — S63211D Subluxation of metacarpophalangeal joint of left index finger, subsequent encounter: Secondary | ICD-10-CM | POA: Diagnosis not present

## 2021-05-06 DIAGNOSIS — M19042 Primary osteoarthritis, left hand: Secondary | ICD-10-CM | POA: Diagnosis not present

## 2021-05-09 DIAGNOSIS — M4807 Spinal stenosis, lumbosacral region: Secondary | ICD-10-CM | POA: Diagnosis not present

## 2021-05-09 DIAGNOSIS — Z0000 Encounter for general adult medical examination without abnormal findings: Secondary | ICD-10-CM | POA: Diagnosis not present

## 2021-05-09 DIAGNOSIS — I714 Abdominal aortic aneurysm, without rupture, unspecified: Secondary | ICD-10-CM | POA: Diagnosis not present

## 2021-05-09 DIAGNOSIS — E785 Hyperlipidemia, unspecified: Secondary | ICD-10-CM | POA: Diagnosis not present

## 2021-05-09 DIAGNOSIS — E1169 Type 2 diabetes mellitus with other specified complication: Secondary | ICD-10-CM | POA: Diagnosis not present

## 2021-05-09 DIAGNOSIS — I10 Essential (primary) hypertension: Secondary | ICD-10-CM | POA: Diagnosis not present

## 2021-05-10 ENCOUNTER — Other Ambulatory Visit: Payer: Self-pay | Admitting: Internal Medicine

## 2021-05-10 DIAGNOSIS — E785 Hyperlipidemia, unspecified: Secondary | ICD-10-CM

## 2021-05-10 DIAGNOSIS — M19042 Primary osteoarthritis, left hand: Secondary | ICD-10-CM | POA: Diagnosis not present

## 2021-05-10 DIAGNOSIS — S63211D Subluxation of metacarpophalangeal joint of left index finger, subsequent encounter: Secondary | ICD-10-CM | POA: Diagnosis not present

## 2021-05-12 DIAGNOSIS — S63211D Subluxation of metacarpophalangeal joint of left index finger, subsequent encounter: Secondary | ICD-10-CM | POA: Diagnosis not present

## 2021-05-12 DIAGNOSIS — M79644 Pain in right finger(s): Secondary | ICD-10-CM | POA: Diagnosis not present

## 2021-05-12 DIAGNOSIS — M19042 Primary osteoarthritis, left hand: Secondary | ICD-10-CM | POA: Diagnosis not present

## 2021-05-16 DIAGNOSIS — S63211D Subluxation of metacarpophalangeal joint of left index finger, subsequent encounter: Secondary | ICD-10-CM | POA: Diagnosis not present

## 2021-05-16 DIAGNOSIS — M19042 Primary osteoarthritis, left hand: Secondary | ICD-10-CM | POA: Diagnosis not present

## 2021-05-18 DIAGNOSIS — S63211D Subluxation of metacarpophalangeal joint of left index finger, subsequent encounter: Secondary | ICD-10-CM | POA: Diagnosis not present

## 2021-05-18 DIAGNOSIS — M19042 Primary osteoarthritis, left hand: Secondary | ICD-10-CM | POA: Diagnosis not present

## 2021-05-23 DIAGNOSIS — S63211D Subluxation of metacarpophalangeal joint of left index finger, subsequent encounter: Secondary | ICD-10-CM | POA: Diagnosis not present

## 2021-05-23 DIAGNOSIS — M19042 Primary osteoarthritis, left hand: Secondary | ICD-10-CM | POA: Diagnosis not present

## 2021-05-25 ENCOUNTER — Other Ambulatory Visit: Payer: Self-pay | Admitting: Hematology and Oncology

## 2021-05-25 ENCOUNTER — Other Ambulatory Visit: Payer: Self-pay

## 2021-05-25 ENCOUNTER — Inpatient Hospital Stay: Payer: Medicare Other | Attending: Hematology and Oncology

## 2021-05-25 DIAGNOSIS — Z809 Family history of malignant neoplasm, unspecified: Secondary | ICD-10-CM | POA: Insufficient documentation

## 2021-05-25 DIAGNOSIS — R7989 Other specified abnormal findings of blood chemistry: Secondary | ICD-10-CM | POA: Insufficient documentation

## 2021-05-25 DIAGNOSIS — Z87891 Personal history of nicotine dependence: Secondary | ICD-10-CM | POA: Diagnosis not present

## 2021-05-25 DIAGNOSIS — D472 Monoclonal gammopathy: Secondary | ICD-10-CM | POA: Insufficient documentation

## 2021-05-25 DIAGNOSIS — Z79899 Other long term (current) drug therapy: Secondary | ICD-10-CM | POA: Insufficient documentation

## 2021-05-25 DIAGNOSIS — F17210 Nicotine dependence, cigarettes, uncomplicated: Secondary | ICD-10-CM | POA: Insufficient documentation

## 2021-05-25 DIAGNOSIS — Z800 Family history of malignant neoplasm of digestive organs: Secondary | ICD-10-CM | POA: Insufficient documentation

## 2021-05-25 DIAGNOSIS — D649 Anemia, unspecified: Secondary | ICD-10-CM | POA: Diagnosis not present

## 2021-05-25 LAB — CMP (CANCER CENTER ONLY)
ALT: 19 U/L (ref 0–44)
AST: 24 U/L (ref 15–41)
Albumin: 4.2 g/dL (ref 3.5–5.0)
Alkaline Phosphatase: 91 U/L (ref 38–126)
Anion gap: 4 — ABNORMAL LOW (ref 5–15)
BUN: 24 mg/dL — ABNORMAL HIGH (ref 8–23)
CO2: 28 mmol/L (ref 22–32)
Calcium: 9.8 mg/dL (ref 8.9–10.3)
Chloride: 103 mmol/L (ref 98–111)
Creatinine: 1.01 mg/dL (ref 0.61–1.24)
GFR, Estimated: >60 mL/min (ref >60)
Glucose, Bld: 135 mg/dL — ABNORMAL HIGH (ref 70–99)
Potassium: 4.6 mmol/L (ref 3.5–5.1)
Sodium: 135 mmol/L (ref 135–145)
Total Bilirubin: 0.6 mg/dL (ref 0.3–1.2)
Total Protein: 9 g/dL — ABNORMAL HIGH (ref 6.5–8.1)

## 2021-05-25 LAB — CBC WITH DIFFERENTIAL (CANCER CENTER ONLY)
Abs Immature Granulocytes: 0.01 10*3/uL (ref 0.00–0.07)
Basophils Absolute: 0 10*3/uL (ref 0.0–0.1)
Basophils Relative: 1 %
Eosinophils Absolute: 0.1 10*3/uL (ref 0.0–0.5)
Eosinophils Relative: 3 %
HCT: 33.4 % — ABNORMAL LOW (ref 39.0–52.0)
Hemoglobin: 11.4 g/dL — ABNORMAL LOW (ref 13.0–17.0)
Immature Granulocytes: 0 %
Lymphocytes Relative: 19 %
Lymphs Abs: 0.7 10*3/uL (ref 0.7–4.0)
MCH: 30.9 pg (ref 26.0–34.0)
MCHC: 34.1 g/dL (ref 30.0–36.0)
MCV: 90.5 fL (ref 80.0–100.0)
Monocytes Absolute: 0.4 10*3/uL (ref 0.1–1.0)
Monocytes Relative: 10 %
Neutro Abs: 2.5 10*3/uL (ref 1.7–7.7)
Neutrophils Relative %: 67 %
Platelet Count: 137 10*3/uL — ABNORMAL LOW (ref 150–400)
RBC: 3.69 MIL/uL — ABNORMAL LOW (ref 4.22–5.81)
RDW: 14.3 % (ref 11.5–15.5)
WBC Count: 3.7 10*3/uL — ABNORMAL LOW (ref 4.0–10.5)
nRBC: 0 % (ref 0.0–0.2)

## 2021-05-25 LAB — PROTEIN / CREATININE RATIO, URINE
Creatinine, Urine: 77.29 mg/dL
Total Protein, Urine: <6 mg/dL

## 2021-05-25 LAB — LACTATE DEHYDROGENASE: LDH: 160 U/L (ref 98–192)

## 2021-05-26 DIAGNOSIS — M19042 Primary osteoarthritis, left hand: Secondary | ICD-10-CM | POA: Diagnosis not present

## 2021-05-26 DIAGNOSIS — S63211D Subluxation of metacarpophalangeal joint of left index finger, subsequent encounter: Secondary | ICD-10-CM | POA: Diagnosis not present

## 2021-05-26 LAB — KAPPA/LAMBDA LIGHT CHAINS
Kappa free light chain: 70 mg/L — ABNORMAL HIGH (ref 3.3–19.4)
Kappa, lambda light chain ratio: 6.19 — ABNORMAL HIGH (ref 0.26–1.65)
Lambda free light chains: 11.3 mg/L (ref 5.7–26.3)

## 2021-05-26 LAB — BETA 2 MICROGLOBULIN, SERUM: Beta-2 Microglobulin: 2.4 mg/L (ref 0.6–2.4)

## 2021-05-30 LAB — MULTIPLE MYELOMA PANEL, SERUM
Albumin SerPl Elph-Mcnc: 4.2 g/dL (ref 2.9–4.4)
Albumin/Glob SerPl: 0.9 (ref 0.7–1.7)
Alpha 1: 0.2 g/dL (ref 0.0–0.4)
Alpha2 Glob SerPl Elph-Mcnc: 0.8 g/dL (ref 0.4–1.0)
B-Globulin SerPl Elph-Mcnc: 0.9 g/dL (ref 0.7–1.3)
Gamma Glob SerPl Elph-Mcnc: 2.8 g/dL — ABNORMAL HIGH (ref 0.4–1.8)
Globulin, Total: 4.7 g/dL — ABNORMAL HIGH (ref 2.2–3.9)
IgA: 16 mg/dL — ABNORMAL LOW (ref 61–437)
IgG (Immunoglobin G), Serum: 3329 mg/dL — ABNORMAL HIGH (ref 603–1613)
IgM (Immunoglobulin M), Srm: 31 mg/dL (ref 15–143)
M Protein SerPl Elph-Mcnc: 2.7 g/dL — ABNORMAL HIGH
Total Protein ELP: 8.9 g/dL — ABNORMAL HIGH (ref 6.0–8.5)

## 2021-06-01 ENCOUNTER — Inpatient Hospital Stay: Payer: Medicare Other

## 2021-06-01 ENCOUNTER — Inpatient Hospital Stay (HOSPITAL_BASED_OUTPATIENT_CLINIC_OR_DEPARTMENT_OTHER): Payer: Medicare Other | Admitting: Hematology and Oncology

## 2021-06-01 ENCOUNTER — Other Ambulatory Visit: Payer: Self-pay

## 2021-06-01 VITALS — BP 137/75 | HR 104 | Temp 97.4°F | Resp 18 | Ht 67.0 in | Wt 239.1 lb

## 2021-06-01 DIAGNOSIS — F17210 Nicotine dependence, cigarettes, uncomplicated: Secondary | ICD-10-CM | POA: Diagnosis not present

## 2021-06-01 DIAGNOSIS — Z79899 Other long term (current) drug therapy: Secondary | ICD-10-CM | POA: Diagnosis not present

## 2021-06-01 DIAGNOSIS — D500 Iron deficiency anemia secondary to blood loss (chronic): Secondary | ICD-10-CM

## 2021-06-01 DIAGNOSIS — S63211D Subluxation of metacarpophalangeal joint of left index finger, subsequent encounter: Secondary | ICD-10-CM | POA: Diagnosis not present

## 2021-06-01 DIAGNOSIS — Z87891 Personal history of nicotine dependence: Secondary | ICD-10-CM | POA: Diagnosis not present

## 2021-06-01 DIAGNOSIS — D649 Anemia, unspecified: Secondary | ICD-10-CM | POA: Diagnosis not present

## 2021-06-01 DIAGNOSIS — D472 Monoclonal gammopathy: Secondary | ICD-10-CM

## 2021-06-01 DIAGNOSIS — M19042 Primary osteoarthritis, left hand: Secondary | ICD-10-CM | POA: Diagnosis not present

## 2021-06-01 DIAGNOSIS — R7989 Other specified abnormal findings of blood chemistry: Secondary | ICD-10-CM | POA: Diagnosis not present

## 2021-06-01 LAB — RETIC PANEL
Immature Retic Fract: 19.9 % — ABNORMAL HIGH (ref 2.3–15.9)
RBC.: 3.52 MIL/uL — ABNORMAL LOW (ref 4.22–5.81)
Retic Count, Absolute: 61.2 10*3/uL (ref 19.0–186.0)
Retic Ct Pct: 1.7 % (ref 0.4–3.1)
Reticulocyte Hemoglobin: 33.8 pg (ref >27.9)

## 2021-06-01 LAB — CBC WITH DIFFERENTIAL (CANCER CENTER ONLY)
Abs Immature Granulocytes: 0.01 10*3/uL (ref 0.00–0.07)
Basophils Absolute: 0 10*3/uL (ref 0.0–0.1)
Basophils Relative: 0 %
Eosinophils Absolute: 0.1 10*3/uL (ref 0.0–0.5)
Eosinophils Relative: 2 %
HCT: 32.2 % — ABNORMAL LOW (ref 39.0–52.0)
Hemoglobin: 10.9 g/dL — ABNORMAL LOW (ref 13.0–17.0)
Immature Granulocytes: 0 %
Lymphocytes Relative: 20 %
Lymphs Abs: 0.6 10*3/uL — ABNORMAL LOW (ref 0.7–4.0)
MCH: 31 pg (ref 26.0–34.0)
MCHC: 33.9 g/dL (ref 30.0–36.0)
MCV: 91.5 fL (ref 80.0–100.0)
Monocytes Absolute: 0.1 10*3/uL (ref 0.1–1.0)
Monocytes Relative: 5 %
Neutro Abs: 2.1 10*3/uL (ref 1.7–7.7)
Neutrophils Relative %: 73 %
Platelet Count: 131 10*3/uL — ABNORMAL LOW (ref 150–400)
RBC: 3.52 MIL/uL — ABNORMAL LOW (ref 4.22–5.81)
RDW: 14.6 % (ref 11.5–15.5)
WBC Count: 2.9 10*3/uL — ABNORMAL LOW (ref 4.0–10.5)
nRBC: 0 % (ref 0.0–0.2)

## 2021-06-01 LAB — FERRITIN: Ferritin: 196 ng/mL (ref 24–336)

## 2021-06-01 LAB — FOLATE: Folate: 14.3 ng/mL (ref >5.9)

## 2021-06-01 LAB — IRON AND IRON BINDING CAPACITY (CC-WL,HP ONLY)
Iron: 116 ug/dL (ref 45–182)
Saturation Ratios: 29 % (ref 17.9–39.5)
TIBC: 399 ug/dL (ref 250–450)
UIBC: 283 ug/dL (ref 117–376)

## 2021-06-01 LAB — VITAMIN B12: Vitamin B-12: 300 pg/mL (ref 180–914)

## 2021-06-01 NOTE — Progress Notes (Signed)
[place] [phone]   Fax:([phone]  PROGRESS NOTE  Patient Care Team: Deland Pretty, MD as PCP - General (Internal Medicine) Frederik Pear, MD as Consulting Physician (Orthopedic Surgery) Phylliss Bob, MD as Consulting Physician (Orthopedic Surgery) Magrinat, Virgie Dad, MD (Inactive) as Consulting Physician (Hematology and Oncology) Ashok Pall, MD as Consulting Physician (Neurosurgery) Myrlene Broker, MD as Attending Physician (Urology)  Hematological/Oncological History # IgG Kappa Monoclonal Gammopathy of Undetermined Significance 04/02/2018: underwent bone marrow biopsy, showed 9% plasma cells. 10/21/2020: last visit with Dr. Jana Hakim 05/25/2021: Labs showed M protein 2.7, kappa 70, lambda 11.3, and ratio 6.19.  White blood cell count 3.7, hemoglobin 1.4, MCV 90.5, and platelets 137 06/01/2021: establish care with Dr. Lorenso Courier   Interval History:  Walter White. 71 y.o. male with medical history significant for IgG kappa monoclonal gammopathy of undetermined significance who presents for a follow up visit. The patient's last visit was on 10/21/2020 with Dr. Jana Hakim. In the interim since the last visit he has had no major changes in his health.  On exam today Mr. Reiswig reports that he has had numerous surgeries on his back.  He reports that there have always been consistent hemoglobin drops around the time that he has the surgeries performed.  He reports most recently had his knee worked on in February 2023 and a knuckle joint in the left hand for which he is currently still wearing a brace.  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  He notes that his energy levels are good and he reports they are about a 6 out of 10.  His appetite is also been strong.  He has been taking gabapentin which helps him feel better but unfortunately  has caused some issues with sleepiness.  He otherwise denies any fevers, chills, sweats, nausea, vomiting or diarrhea.  A full 10 point ROS is listed below.  MEDICAL HISTORY:  Past Medical History:  Diagnosis Date   AAA (abdominal aortic aneurysm)    4.0 cm AAA 08/22/17 U/S   Allergy    Allergic Rhinitis   Anemia    Arthritis    spinal steniosis   Benign prostate hyperplasia    with lower urinary tract symptoms   Carpal tunnel syndrome on right    Cataracts, both eyes    Chronic back pain    caused by spinal stenosis   Elevated PSA    GERD (gastroesophageal reflux disease)    Hyperlipidemia    Hypertension    Essential.     MGUS (monoclonal gammopathy of unknown significance)    Microhematuria    OA (osteoarthritis) of knee    Pneumonia    Hx -when very young   Pre-diabetes    Seasonal allergies    Spinal stenosis    of lumbosacral region.  Causes chronic back pain.   Upper abdominal pain     SURGICAL HISTORY: Past Surgical History:  Procedure Laterality Date   ABDOMINAL EXPOSURE N/A 09/05/2017   Procedure: ABDOMINAL EXPOSURE;  Surgeon: Rosetta Posner, MD;  Location: [facility] OR;  Service: Vascular;  Laterality: N/A;   ANTERIOR LAT LUMBAR FUSION Left 01/26/2016   Procedure: LEFT SIDED LATERAL INTERBODY FUSION, LUMBAR 3-4 WITH INSTRUMENTATION AND ALLOGRAFT;  Surgeon: Phylliss Bob, MD;  Location: [place];  Service: Orthopedics;  Laterality: Left;  LEFT SIDED LATERAL INTERBODY FUSION, LUMBAR 3-4 WITH INSTRUMENTATION  AND ALLOGRAFT   ANTERIOR LAT LUMBAR FUSION Right 08/14/2018   Procedure: RIGHT LUMBAR two to LUMBAR three LATERAL INTERBODY FUSION WITH INSTRUMENTATION AND ALLOGRAFT;  Surgeon: Phylliss Bob, MD;  Location: [place];  Service: Orthopedics;  Laterality: Right;   ANTERIOR LATERAL LUMBAR FUSION WITH PERCUTANEOUS SCREW 1 LEVEL N/A 09/05/2017   Procedure: LUMBAR 5 - SACRUM 1 ANTERIOR LUMBAR INTERBODY FUSION WITH INSTRUMENTATION AND ALLOGRAFT;  Surgeon: Phylliss Bob, MD;  Location:  [place];  Service: Orthopedics;  Laterality: N/A;   BACK SURGERY  01/2015   dr. Lynann Bologna. [phone].   CARPAL TUNNEL RELEASE     right    CATARACT EXTRACTION, BILATERAL     COLONOSCOPY     COLONOSCOPY  2010,2016   polyp   ELBOW SURGERY     right ulnar release   ELBOW SURGERY     LEFT CTR   EYE SURGERY     FINGER ARTHROPLASTY Left 04/04/2021   Procedure: LEFT INDEX FINGER IMPLANT ARTHROPLASTY AT METACARPOPHALANGEAL JOINT;  Surgeon: Milly Jakob, MD;  Location: [place];  Service: Orthopedics;  Laterality: Left;  PRE-OP BLOCK,   JOINT REPLACEMENT     left- Sept 2016   KNEE ARTHROSCOPY  11/23/2010   Procedure: ARTHROSCOPY KNEE;  Surgeon: Kerin Salen;  Location: [place];  Service: Orthopedics;  Laterality: Left;  arthroscopy left knee   KNEE ARTHROSCOPY     left x 2   Knee scar tissue removal Left 2018   LATERAL FUSION LUMBAR SPINE  01./17/2018   polypectomy  2010   SHOULDER ARTHROSCOPY Left    x2   TONSILLECTOMY     TOTAL KNEE ARTHROPLASTY Left 09/21/2014   Procedure: TOTAL KNEE ARTHROPLASTY;  Surgeon: Frederik Pear, MD;  Location: [place];  Service: Orthopedics;  Laterality: Left;   ULNAR NERVE TRANSPOSITION Left 04/16/2017   Procedure: LEFT ULNAR NEUROPLASTY AT THE WRIST AND TRANSPOSITION AT THE ELBOW;  Surgeon: Milly Jakob, MD;  Location: [place];  Service: Orthopedics;  Laterality: Left;   WISDOM TOOTH EXTRACTION      SOCIAL HISTORY: Social History   Socioeconomic History   Marital status: Married    Spouse name: [name]    Number of children: 2   Years of education: Some colle   Highest education level: Not on file  Occupational History   Occupation: Retired  Tobacco Use   Smoking status: Former    Types: Cigarettes    Quit date: 11/21/1980    Years since quitting: 40.5   Smokeless tobacco: Never  Vaping Use   Vaping Use: Never used  Substance and Sexual Activity   Alcohol use: Yes    Alcohol/week: 0.0  standard drinks    Comment: Very rare   Drug use: No   Sexual activity: Not on file  Other Topics Concern   Not on file  Social History Narrative   Lives with wife   Caffeine use: tea every day   Social Determinants of Health   Financial Resource Strain: Not on file  Food Insecurity: Not on file  Transportation Needs: Not on file  Physical Activity: Not on file  Stress: Not on file  Social Connections: Not on file  Intimate Partner Violence: Not on file    FAMILY HISTORY: Family History  Problem Relation Age of Onset   Cancer Mother    Liver disease Father    Colon cancer Maternal Uncle 53   Colon polyps Neg Hx    Esophageal cancer Neg Hx  Rectal cancer Neg Hx    Stomach cancer Neg Hx     ALLERGIES:  is allergic to pork-derived products, shellfish allergy, lovastatin, and penicillins.  MEDICATIONS:  Current Outpatient Medications  Medication Sig Dispense Refill   acetaminophen (TYLENOL) 325 MG tablet Take 1 tablet (325 mg total) by mouth every 6 (six) hours.     CELEBREX 200 MG capsule Take 200 mg by mouth 2 (two) times daily.     Cholecalciferol (VITAMIN D) 125 MCG (5000 UT) CAPS Take 5,000 Units by mouth daily.     fluticasone (FLONASE) 50 MCG/ACT nasal spray      gabapentin (NEURONTIN) 100 MG capsule Take 100 mg by mouth 2 (two) times daily.     HYDROcodone-acetaminophen (NORCO/VICODIN) 5-325 MG tablet Take 1 tablet by mouth 2 (two) times daily as needed for moderate pain.     lisinopril-hydrochlorothiazide (PRINZIDE,ZESTORETIC) 20-25 MG tablet Take 1 tablet by mouth daily.     pantoprazole (PROTONIX) 40 MG tablet Take 40 mg by mouth daily.     Psyllium (EQ DAILY FIBER PO) Take 5 tablets by mouth daily.     rosuvastatin (CRESTOR) 5 MG tablet Take 5 mg by mouth daily.     traMADol (ULTRAM) 50 MG tablet      No current facility-administered medications for this visit.    REVIEW OF SYSTEMS:   Constitutional: ( - ) fevers, ( - )  chills , ( - ) night  sweats Eyes: ( - ) blurriness of vision, ( - ) double vision, ( - ) watery eyes Ears, nose, mouth, throat, and face: ( - ) mucositis, ( - ) sore throat Respiratory: ( - ) cough, ( - ) dyspnea, ( - ) wheezes Cardiovascular: ( - ) palpitation, ( - ) chest discomfort, ( - ) lower extremity swelling Gastrointestinal:  ( - ) nausea, ( - ) heartburn, ( - ) change in bowel habits Skin: ( - ) abnormal skin rashes Lymphatics: ( - ) new lymphadenopathy, ( - ) easy bruising Neurological: ( - ) numbness, ( - ) tingling, ( - ) new weaknesses Behavioral/Psych: ( - ) mood change, ( - ) new changes  All other systems were reviewed with the patient and are negative.  PHYSICAL EXAMINATION: ECOG PERFORMANCE STATUS: 1 - Symptomatic but completely ambulatory  Vitals:   06/01/21 0908  BP: 137/75  Pulse: (!) 104  Resp: 18  Temp: (!) 97.4 F (36.3 C)  SpO2: 99%   Filed Weights   06/01/21 0908  Weight: 239 lb 1.6 oz (108.5 kg)    GENERAL: Well-appearing elderly Caucasian male, alert, no distress and comfortable SKIN: skin color, texture, turgor are normal, no rashes or significant lesions EYES: conjunctiva are pink and non-injected, sclera clear LUNGS: clear to auscultation and percussion with normal breathing effort HEART: regular rate & rhythm and no murmurs and no lower extremity edema Musculoskeletal: no cyanosis of digits and no clubbing  PSYCH: alert & oriented x 3, fluent speech NEURO: no focal motor/sensory deficits  LABORATORY DATA:  I have reviewed the data as listed    Latest Ref Rng & Units 06/01/2021    9:35 AM 05/25/2021    9:08 AM 10/14/2020    8:44 AM  CBC  WBC 4.0 - 10.5 K/uL 2.9   3.7   3.6    Hemoglobin 13.0 - 17.0 g/dL 10.9   11.4   11.5    Hematocrit 39.0 - 52.0 % 32.2   33.4   34.6  Platelets 150 - 400 K/uL 131   137   153         Latest Ref Rng & Units 05/25/2021    9:08 AM 03/30/2021    9:54 AM 10/14/2020    8:44 AM  CMP  Glucose 70 - 99 mg/dL 135   154   124     BUN 8 - 23 mg/dL 24   33   28    Creatinine 0.61 - 1.24 mg/dL 1.01   1.10   1.04    Sodium 135 - 145 mmol/L 135   136   137    Potassium 3.5 - 5.1 mmol/L 4.6   4.0   4.1    Chloride 98 - 111 mmol/L 103   105   104    CO2 22 - 32 mmol/L _0 Calcium 8.9 - 10.3 mg/dL 9.8   9.7   9.7    Total Protein 6.5 - 8.1 g/dL 9.0    9.1    Total Bilirubin 0.3 - 1.2 mg/dL 0.6    0.8    Alkaline Phos 38 - 126 U/L 91    85    AST 15 - 41 U/L 24    34    ALT 0 - 44 U/L 19    22      Lab Results  Component Value Date   MPROTEIN 2.7 (H) 05/25/2021   MPROTEIN 2.5 (H) 10/14/2020   MPROTEIN 2.3 (H) 07/30/2020   Lab Results  Component Value Date   KPAFRELGTCHN 70.0 (H) 05/25/2021   KPAFRELGTCHN 80.9 (H) 10/14/2020   KPAFRELGTCHN 46.5 (H) 07/30/2020   LAMBDASER 11.3 05/25/2021   LAMBDASER 10.7 10/14/2020   LAMBDASER 10.0 07/30/2020   KAPLAMBRATIO 6.19 (H) 05/25/2021   KAPLAMBRATIO 7.56 (H) 10/14/2020   KAPLAMBRATIO 4.65 (H) 07/30/2020   RADIOGRAPHIC STUDIES: No results found.  ASSESSMENT & PLAN Walter White. 71 y.o. male with medical history significant for IgG kappa monoclonal gammopathy of undetermined significance who presents for a follow up visit.   Monoclonal Gammopathies are a group of medical conditions defined by the presence of a monoclonal protein (an M protein) in the blood or urine. Monoclonal gammopathies include monoclonal gammopathy of unknown significance (MGUS), Monoclonal gammopathies of renal or neurological significance,  smoldering multiple myeloma (SMM), multiple myeloma (MM), AL amyloidosis, and Waldenstrom macroglobulinemia. The goal of the initial workup is to determine which monoclonal gammopathy a patient has. The workup consists of evaluating protein in the serum (with serum protein electrophoresis (SPEP) and serum free light chains) , evaluating protein in the urine (UPEP), and evaluation of the skeleton (DG Bone Met Survey) to assure no lytic lesions.  Baseline bloodwork includes CMP and CBC. If no CRAB criteria or high risk criteria are noted then the diagnosis is MGUS. MGUS must be followed with bloodwork periodically to assure it does not convert to multiple myeloma (occurs to approximately 1% of patients per year). If there are CRAB criteria or high risk features (such as elevated serum free light chain ratio (taking into account renal function), a non IgG M protein, or M protein >1.5) then a bone marrow biopsy must be pursued.    #IgG Kappa Monoclonal Gammopathy of Undetermined Significance -- Prior bone marrow biopsy performed on 04/02/2018 which confirmed diagnosis of MGUS with 9% plasma cells --Steady increase in M protein since time of diagnosis with mild increase in serum free light chains --Patient has mild anemia  which is also been stable with normal kidney function. Plan: --at each visit will order an SPEP, UPEP, SFLC and beta 2 microglobulin --additionally will collect new baseline CBC, CMP, and LDH --recommend a metastatic bone survey to assess for lytic lesions on a yearly basis.  --will consider the need for a repeat bone marrow biopsy pending the above results --lLabs showed M protein 2.7, kappa 70, lambda 11.3, and ratio 6.19.  White blood cell count 3.7, hemoglobin 1.4, MCV 90.5, and platelets 137 --RTC in 6 months time.    Orders Placed This Encounter  Procedures   Ferritin    Standing Status:   Future    Number of Occurrences:   1    Standing Expiration Date:   06/02/2022   Iron and Iron Binding Capacity ([facility] only)    Standing Status:   Future    Number of Occurrences:   1    Standing Expiration Date:   06/02/2022   Retic Panel    Standing Status:   Future    Number of Occurrences:   1    Standing Expiration Date:   06/02/2022   CBC with Differential (Cancer Center Only)    Standing Status:   Future    Number of Occurrences:   1    Standing Expiration Date:   06/02/2022   Vitamin B12    Standing Status:    Future    Number of Occurrences:   1    Standing Expiration Date:   06/01/2022   Folate, Serum    Standing Status:   Future    Number of Occurrences:   1    Standing Expiration Date:   06/01/2022   Methylmalonic acid, serum    Standing Status:   Future    Number of Occurrences:   1    Standing Expiration Date:   06/01/2022    All questions were answered. The patient knows to call the clinic with any problems, questions or concerns.  A total of more than 40 minutes were spent on this encounter with face-to-face time and non-face-to-face time, including preparing to see the patient, ordering tests and/or medications, counseling the patient and coordination of care as outlined above.   Ledell Peoples, MD Department of Hematology/Oncology [place] [facility] Phone: [phone] Pager: [phone] Email: [email or web address]  06/01/2021 5:21 PM

## 2021-06-02 ENCOUNTER — Telehealth: Payer: Self-pay | Admitting: Hematology and Oncology

## 2021-06-02 NOTE — Telephone Encounter (Signed)
Per 5/24 los called and spoke to pt about 6 month appointment.  Pt confirmed appointment

## 2021-06-03 DIAGNOSIS — M19042 Primary osteoarthritis, left hand: Secondary | ICD-10-CM | POA: Diagnosis not present

## 2021-06-03 DIAGNOSIS — S63211D Subluxation of metacarpophalangeal joint of left index finger, subsequent encounter: Secondary | ICD-10-CM | POA: Diagnosis not present

## 2021-06-06 LAB — METHYLMALONIC ACID, SERUM: Methylmalonic Acid, Quantitative: 166 nmol/L (ref 0–378)

## 2021-06-08 DIAGNOSIS — M19042 Primary osteoarthritis, left hand: Secondary | ICD-10-CM | POA: Diagnosis not present

## 2021-06-08 DIAGNOSIS — S63211D Subluxation of metacarpophalangeal joint of left index finger, subsequent encounter: Secondary | ICD-10-CM | POA: Diagnosis not present

## 2021-06-09 DIAGNOSIS — Z6837 Body mass index (BMI) 37.0-37.9, adult: Secondary | ICD-10-CM | POA: Diagnosis not present

## 2021-06-09 DIAGNOSIS — M4316 Spondylolisthesis, lumbar region: Secondary | ICD-10-CM | POA: Diagnosis not present

## 2021-06-10 DIAGNOSIS — M19042 Primary osteoarthritis, left hand: Secondary | ICD-10-CM | POA: Diagnosis not present

## 2021-06-10 DIAGNOSIS — S63211D Subluxation of metacarpophalangeal joint of left index finger, subsequent encounter: Secondary | ICD-10-CM | POA: Diagnosis not present

## 2021-06-14 DIAGNOSIS — S63211D Subluxation of metacarpophalangeal joint of left index finger, subsequent encounter: Secondary | ICD-10-CM | POA: Diagnosis not present

## 2021-06-14 DIAGNOSIS — M19042 Primary osteoarthritis, left hand: Secondary | ICD-10-CM | POA: Diagnosis not present

## 2021-06-16 DIAGNOSIS — H8110 Benign paroxysmal vertigo, unspecified ear: Secondary | ICD-10-CM | POA: Diagnosis not present

## 2021-06-17 DIAGNOSIS — M19042 Primary osteoarthritis, left hand: Secondary | ICD-10-CM | POA: Diagnosis not present

## 2021-06-17 DIAGNOSIS — S63211D Subluxation of metacarpophalangeal joint of left index finger, subsequent encounter: Secondary | ICD-10-CM | POA: Diagnosis not present

## 2021-06-21 DIAGNOSIS — H43812 Vitreous degeneration, left eye: Secondary | ICD-10-CM | POA: Diagnosis not present

## 2021-06-21 DIAGNOSIS — S63211D Subluxation of metacarpophalangeal joint of left index finger, subsequent encounter: Secondary | ICD-10-CM | POA: Diagnosis not present

## 2021-06-21 DIAGNOSIS — H33312 Horseshoe tear of retina without detachment, left eye: Secondary | ICD-10-CM | POA: Diagnosis not present

## 2021-06-21 DIAGNOSIS — H26492 Other secondary cataract, left eye: Secondary | ICD-10-CM | POA: Diagnosis not present

## 2021-06-21 DIAGNOSIS — M19042 Primary osteoarthritis, left hand: Secondary | ICD-10-CM | POA: Diagnosis not present

## 2021-06-23 DIAGNOSIS — M19042 Primary osteoarthritis, left hand: Secondary | ICD-10-CM | POA: Diagnosis not present

## 2021-06-23 DIAGNOSIS — S63211D Subluxation of metacarpophalangeal joint of left index finger, subsequent encounter: Secondary | ICD-10-CM | POA: Diagnosis not present

## 2021-06-27 DIAGNOSIS — S63211D Subluxation of metacarpophalangeal joint of left index finger, subsequent encounter: Secondary | ICD-10-CM | POA: Diagnosis not present

## 2021-06-27 DIAGNOSIS — M19042 Primary osteoarthritis, left hand: Secondary | ICD-10-CM | POA: Diagnosis not present

## 2021-06-30 DIAGNOSIS — M19042 Primary osteoarthritis, left hand: Secondary | ICD-10-CM | POA: Diagnosis not present

## 2021-06-30 DIAGNOSIS — S63211D Subluxation of metacarpophalangeal joint of left index finger, subsequent encounter: Secondary | ICD-10-CM | POA: Diagnosis not present

## 2021-07-04 DIAGNOSIS — S63211D Subluxation of metacarpophalangeal joint of left index finger, subsequent encounter: Secondary | ICD-10-CM | POA: Diagnosis not present

## 2021-07-04 DIAGNOSIS — M19042 Primary osteoarthritis, left hand: Secondary | ICD-10-CM | POA: Diagnosis not present

## 2021-07-06 DIAGNOSIS — X32XXXD Exposure to sunlight, subsequent encounter: Secondary | ICD-10-CM | POA: Diagnosis not present

## 2021-07-06 DIAGNOSIS — H8110 Benign paroxysmal vertigo, unspecified ear: Secondary | ICD-10-CM | POA: Diagnosis not present

## 2021-07-06 DIAGNOSIS — L570 Actinic keratosis: Secondary | ICD-10-CM | POA: Diagnosis not present

## 2021-07-06 DIAGNOSIS — R260 Ataxic gait: Secondary | ICD-10-CM | POA: Diagnosis not present

## 2021-07-06 DIAGNOSIS — Z1283 Encounter for screening for malignant neoplasm of skin: Secondary | ICD-10-CM | POA: Diagnosis not present

## 2021-07-06 DIAGNOSIS — D225 Melanocytic nevi of trunk: Secondary | ICD-10-CM | POA: Diagnosis not present

## 2021-07-06 DIAGNOSIS — L820 Inflamed seborrheic keratosis: Secondary | ICD-10-CM | POA: Diagnosis not present

## 2021-07-07 DIAGNOSIS — R972 Elevated prostate specific antigen [PSA]: Secondary | ICD-10-CM | POA: Diagnosis not present

## 2021-07-07 DIAGNOSIS — M19042 Primary osteoarthritis, left hand: Secondary | ICD-10-CM | POA: Diagnosis not present

## 2021-07-08 DIAGNOSIS — M19042 Primary osteoarthritis, left hand: Secondary | ICD-10-CM | POA: Diagnosis not present

## 2021-07-08 DIAGNOSIS — S63211D Subluxation of metacarpophalangeal joint of left index finger, subsequent encounter: Secondary | ICD-10-CM | POA: Diagnosis not present

## 2021-07-13 DIAGNOSIS — H8110 Benign paroxysmal vertigo, unspecified ear: Secondary | ICD-10-CM | POA: Diagnosis not present

## 2021-07-13 DIAGNOSIS — R260 Ataxic gait: Secondary | ICD-10-CM | POA: Diagnosis not present

## 2021-07-14 DIAGNOSIS — M19042 Primary osteoarthritis, left hand: Secondary | ICD-10-CM | POA: Diagnosis not present

## 2021-07-14 DIAGNOSIS — S63211D Subluxation of metacarpophalangeal joint of left index finger, subsequent encounter: Secondary | ICD-10-CM | POA: Diagnosis not present

## 2021-07-15 DIAGNOSIS — H8110 Benign paroxysmal vertigo, unspecified ear: Secondary | ICD-10-CM | POA: Diagnosis not present

## 2021-07-15 DIAGNOSIS — R260 Ataxic gait: Secondary | ICD-10-CM | POA: Diagnosis not present

## 2021-07-18 DIAGNOSIS — H8110 Benign paroxysmal vertigo, unspecified ear: Secondary | ICD-10-CM | POA: Diagnosis not present

## 2021-07-18 DIAGNOSIS — R260 Ataxic gait: Secondary | ICD-10-CM | POA: Diagnosis not present

## 2021-07-20 ENCOUNTER — Ambulatory Visit
Admission: RE | Admit: 2021-07-20 | Discharge: 2021-07-20 | Disposition: A | Discharge status: Home / Self Care | Payer: No Typology Code available for payment source | Source: Ambulatory Visit | Attending: Internal Medicine | Admitting: Internal Medicine

## 2021-07-20 DIAGNOSIS — M19042 Primary osteoarthritis, left hand: Secondary | ICD-10-CM | POA: Diagnosis not present

## 2021-07-20 DIAGNOSIS — I700 Atherosclerosis of aorta: Secondary | ICD-10-CM | POA: Diagnosis not present

## 2021-07-20 DIAGNOSIS — E785 Hyperlipidemia, unspecified: Secondary | ICD-10-CM

## 2021-07-20 DIAGNOSIS — S63211D Subluxation of metacarpophalangeal joint of left index finger, subsequent encounter: Secondary | ICD-10-CM | POA: Diagnosis not present

## 2021-07-22 DIAGNOSIS — M19042 Primary osteoarthritis, left hand: Secondary | ICD-10-CM | POA: Diagnosis not present

## 2021-07-22 DIAGNOSIS — S63211D Subluxation of metacarpophalangeal joint of left index finger, subsequent encounter: Secondary | ICD-10-CM | POA: Diagnosis not present

## 2021-07-25 DIAGNOSIS — M19042 Primary osteoarthritis, left hand: Secondary | ICD-10-CM | POA: Diagnosis not present

## 2021-07-25 DIAGNOSIS — S63211D Subluxation of metacarpophalangeal joint of left index finger, subsequent encounter: Secondary | ICD-10-CM | POA: Diagnosis not present

## 2021-07-27 DIAGNOSIS — H8110 Benign paroxysmal vertigo, unspecified ear: Secondary | ICD-10-CM | POA: Diagnosis not present

## 2021-07-27 DIAGNOSIS — R260 Ataxic gait: Secondary | ICD-10-CM | POA: Diagnosis not present

## 2021-07-28 DIAGNOSIS — M19042 Primary osteoarthritis, left hand: Secondary | ICD-10-CM | POA: Diagnosis not present

## 2021-07-28 DIAGNOSIS — S63211D Subluxation of metacarpophalangeal joint of left index finger, subsequent encounter: Secondary | ICD-10-CM | POA: Diagnosis not present

## 2021-08-01 DIAGNOSIS — M19042 Primary osteoarthritis, left hand: Secondary | ICD-10-CM | POA: Diagnosis not present

## 2021-08-01 DIAGNOSIS — S63211D Subluxation of metacarpophalangeal joint of left index finger, subsequent encounter: Secondary | ICD-10-CM | POA: Diagnosis not present

## 2021-08-02 DIAGNOSIS — R260 Ataxic gait: Secondary | ICD-10-CM | POA: Diagnosis not present

## 2021-08-02 DIAGNOSIS — H8110 Benign paroxysmal vertigo, unspecified ear: Secondary | ICD-10-CM | POA: Diagnosis not present

## 2021-08-04 DIAGNOSIS — M19042 Primary osteoarthritis, left hand: Secondary | ICD-10-CM | POA: Diagnosis not present

## 2021-08-04 DIAGNOSIS — S63211D Subluxation of metacarpophalangeal joint of left index finger, subsequent encounter: Secondary | ICD-10-CM | POA: Diagnosis not present

## 2021-08-05 DIAGNOSIS — R260 Ataxic gait: Secondary | ICD-10-CM | POA: Diagnosis not present

## 2021-08-05 DIAGNOSIS — H8110 Benign paroxysmal vertigo, unspecified ear: Secondary | ICD-10-CM | POA: Diagnosis not present

## 2021-08-08 DIAGNOSIS — S63211D Subluxation of metacarpophalangeal joint of left index finger, subsequent encounter: Secondary | ICD-10-CM | POA: Diagnosis not present

## 2021-08-08 DIAGNOSIS — M19042 Primary osteoarthritis, left hand: Secondary | ICD-10-CM | POA: Diagnosis not present

## 2021-08-11 DIAGNOSIS — M19042 Primary osteoarthritis, left hand: Secondary | ICD-10-CM | POA: Diagnosis not present

## 2021-08-15 DIAGNOSIS — S63211D Subluxation of metacarpophalangeal joint of left index finger, subsequent encounter: Secondary | ICD-10-CM | POA: Diagnosis not present

## 2021-08-15 DIAGNOSIS — M19042 Primary osteoarthritis, left hand: Secondary | ICD-10-CM | POA: Diagnosis not present

## 2021-08-16 ENCOUNTER — Other Ambulatory Visit: Payer: Self-pay | Admitting: Orthopedic Surgery

## 2021-08-16 DIAGNOSIS — R260 Ataxic gait: Secondary | ICD-10-CM | POA: Diagnosis not present

## 2021-08-16 DIAGNOSIS — H8110 Benign paroxysmal vertigo, unspecified ear: Secondary | ICD-10-CM | POA: Diagnosis not present

## 2021-08-19 DIAGNOSIS — R260 Ataxic gait: Secondary | ICD-10-CM | POA: Diagnosis not present

## 2021-08-19 DIAGNOSIS — H8110 Benign paroxysmal vertigo, unspecified ear: Secondary | ICD-10-CM | POA: Diagnosis not present

## 2021-08-22 DIAGNOSIS — H8110 Benign paroxysmal vertigo, unspecified ear: Secondary | ICD-10-CM | POA: Diagnosis not present

## 2021-08-22 DIAGNOSIS — R260 Ataxic gait: Secondary | ICD-10-CM | POA: Diagnosis not present

## 2021-08-25 DIAGNOSIS — H8110 Benign paroxysmal vertigo, unspecified ear: Secondary | ICD-10-CM | POA: Diagnosis not present

## 2021-08-25 DIAGNOSIS — R260 Ataxic gait: Secondary | ICD-10-CM | POA: Diagnosis not present

## 2021-09-07 DIAGNOSIS — R260 Ataxic gait: Secondary | ICD-10-CM | POA: Diagnosis not present

## 2021-09-07 DIAGNOSIS — H8110 Benign paroxysmal vertigo, unspecified ear: Secondary | ICD-10-CM | POA: Diagnosis not present

## 2021-09-08 DIAGNOSIS — S63219A Subluxation of metacarpophalangeal joint of unspecified finger, initial encounter: Secondary | ICD-10-CM | POA: Diagnosis not present

## 2021-09-08 DIAGNOSIS — M19041 Primary osteoarthritis, right hand: Secondary | ICD-10-CM | POA: Diagnosis not present

## 2021-09-14 ENCOUNTER — Encounter (HOSPITAL_BASED_OUTPATIENT_CLINIC_OR_DEPARTMENT_OTHER)
Admission: RE | Admit: 2021-09-14 | Discharge: 2021-09-14 | Disposition: A | Discharge status: Home / Self Care | Payer: Medicare Other | Source: Ambulatory Visit | Attending: Orthopedic Surgery | Admitting: Orthopedic Surgery

## 2021-09-14 DIAGNOSIS — Z01818 Encounter for other preprocedural examination: Secondary | ICD-10-CM | POA: Insufficient documentation

## 2021-09-14 DIAGNOSIS — H8110 Benign paroxysmal vertigo, unspecified ear: Secondary | ICD-10-CM | POA: Diagnosis not present

## 2021-09-14 DIAGNOSIS — R260 Ataxic gait: Secondary | ICD-10-CM | POA: Diagnosis not present

## 2021-09-14 LAB — BASIC METABOLIC PANEL
Anion gap: 8 (ref 5–15)
BUN: 27 mg/dL — ABNORMAL HIGH (ref 8–23)
CO2: 23 mmol/L (ref 22–32)
Calcium: 9.5 mg/dL (ref 8.9–10.3)
Chloride: 103 mmol/L (ref 98–111)
Creatinine, Ser: 1.1 mg/dL (ref 0.61–1.24)
GFR, Estimated: >60 mL/min (ref >60)
Glucose, Bld: 162 mg/dL — ABNORMAL HIGH (ref 70–99)
Potassium: 4.3 mmol/L (ref 3.5–5.1)
Sodium: 134 mmol/L — ABNORMAL LOW (ref 135–145)

## 2021-09-14 NOTE — Progress Notes (Signed)

## 2021-09-15 MED ORDER — VANCOMYCIN HCL 1500 MG/300ML IV SOLN
1500.0000 mg | INTRAVENOUS | Status: AC
  Administered 2021-09-16 (×2): 1500 mg via INTRAVENOUS
  Filled 2021-09-15 (×2): qty 300

## 2021-09-15 NOTE — H&P (Signed)
Primary Care Provider: Dr. Shelia Media Referring Provider: Dr. Hoyt Koch Comp: No Date of Injury or Onset: 2 months PTP 02/22/2017; September 2021 DOS: 04-16-17; 04-04-21 Procedure: L ulnar neuroplasty at the elbow and wrist;   L IF pyrocarbon MP arthroplasty   History: CC / Reason for Visit: Left index finger; R IF HPI: This patient and his wife return to clinic today predominantly to get an injection into his right index finger MP joint, but also to ask a few final questions in regards to his upcoming surgery left index finger MP revision arthroplasty which is scheduled on 09/16/2021 at [facility] outpatient.  He indicates that the left index finger MP is hurting more so than it had been in the past.  He has some questions in regards to whether or not it is possible for subluxation to occur once again.    HPI 08/11/21:This patient returns to clinic today for reevaluation indicating that he has continued in formalized therapy.  He reports that while in therapy everything has improved and that he has made gains, but he notes that there is a squeaking within the joint that is becoming even more audible, and it is a little more uncomfortable.  HPI 07/07/2021: This patient returns to clinic today, now 3 months from the left index finger MP arthroplasty.  He still attending formalized therapy.  He has been in a new custom splint that was created for him.  He is still feeling some squeaking within the joint, but notes that the pain that he was experiencing at nighttime is pretty much gone.  Review of systems as related to current complaint reviewed and unchanged.  Exam:  Vitals: Refer to EMR. Constitutional:  WD, WN, NAD HEENT:  NCAT, EOMI Neuro/Psych:  Alert & oriented to person, place, and time; appropriate mood & affect Lymphatic: No generalized UE edema or lymphadenopathy Extremities / MSK:  Both UE are normal with respect to appearance, ranges of motion, joint stability, muscle strength/tone,  sensation, & perfusion except as otherwise noted:  The incision is healing well over the left index finger MP, although the patient holds the digit more so in extension in a guarded position.  It is tender to palpate over the joint, but not swollen.  He is able to range the joint, but it does cause discomfort and an audible squeaking.  NVI.  The right index finger MP joint is enlarged.  It is tender to palpate.  This is increased with forced flexion and grind testing.  NVI.  Labs / Xrays:  No new x-rays.  3 views of the left index finger ordered and obtained previously demonstrate a volarly subluxated prior carbon MP arthroplasty.  There appeared to be no significant adverse changes between the implant/bone  Assessment:  1. 6 months status post the above surgery, with subluxated left index finger MP 2.  Right index finger MP OA  Procedure: The proper site was marked by palpation with a pen.  It was then prepped with alcohol, topically anesthetized with ethyl chloride, and 1.0 mL of lidocaine buffered with bicarbonate was injected through a 27-gauge needle to provide deeper anesthesia.  Then a mixture of 1 mL of generic Celestone / 0.5 mL of lidocaine was injected through a 22-gauge needle into the right index finger MP joint without fluoroscopic guidance.  A Band-Aid was applied.    Plan: We discussed these findings and the radiographs.  I recommended revision arthroplasty, with soft tissue repair/rebalancing and possibly conversion to 1 piece silicone implant  which he will move forward with next Friday.  The details of the operative procedure were discussed with the patient.  Questions were invited and answered.  In addition to the goal of the procedure, the risks of the procedure to include but not limited to bleeding; infection; damage to the nerves or blood vessels that could result in bleeding, numbness, weakness, chronic pain, and the need for additional procedures; stiffness; the need for  revision surgery; and anesthetic risks were reviewed.  No specific outcome was guaranteed or implied.

## 2021-09-16 ENCOUNTER — Encounter (HOSPITAL_BASED_OUTPATIENT_CLINIC_OR_DEPARTMENT_OTHER)
Admission: RE | Disposition: A | Discharge status: Home / Self Care | Payer: Self-pay | Source: Home / Self Care | Attending: Orthopedic Surgery

## 2021-09-16 ENCOUNTER — Other Ambulatory Visit: Payer: Self-pay

## 2021-09-16 ENCOUNTER — Encounter (HOSPITAL_BASED_OUTPATIENT_CLINIC_OR_DEPARTMENT_OTHER): Payer: Self-pay | Admitting: Orthopedic Surgery

## 2021-09-16 ENCOUNTER — Ambulatory Visit (HOSPITAL_BASED_OUTPATIENT_CLINIC_OR_DEPARTMENT_OTHER): Payer: Medicare Other | Admitting: Anesthesiology

## 2021-09-16 ENCOUNTER — Ambulatory Visit (HOSPITAL_BASED_OUTPATIENT_CLINIC_OR_DEPARTMENT_OTHER)
Admission: RE | Admit: 2021-09-16 | Discharge: 2021-09-16 | Disposition: A | Discharge status: Home / Self Care | Payer: Medicare Other | Attending: Orthopedic Surgery | Admitting: Orthopedic Surgery

## 2021-09-16 ENCOUNTER — Ambulatory Visit (HOSPITAL_BASED_OUTPATIENT_CLINIC_OR_DEPARTMENT_OTHER): Payer: Medicare Other

## 2021-09-16 DIAGNOSIS — Y831 Surgical operation with implant of artificial internal device as the cause of abnormal reaction of the patient, or of later complication, without mention of misadventure at the time of the procedure: Secondary | ICD-10-CM | POA: Diagnosis not present

## 2021-09-16 DIAGNOSIS — E1151 Type 2 diabetes mellitus with diabetic peripheral angiopathy without gangrene: Secondary | ICD-10-CM | POA: Diagnosis not present

## 2021-09-16 DIAGNOSIS — I10 Essential (primary) hypertension: Secondary | ICD-10-CM | POA: Diagnosis not present

## 2021-09-16 DIAGNOSIS — M19041 Primary osteoarthritis, right hand: Secondary | ICD-10-CM | POA: Diagnosis not present

## 2021-09-16 DIAGNOSIS — Z87891 Personal history of nicotine dependence: Secondary | ICD-10-CM | POA: Diagnosis not present

## 2021-09-16 DIAGNOSIS — T84220A Displacement of internal fixation device of bones of hand and fingers, initial encounter: Secondary | ICD-10-CM | POA: Diagnosis not present

## 2021-09-16 DIAGNOSIS — T84028A Dislocation of other internal joint prosthesis, initial encounter: Secondary | ICD-10-CM

## 2021-09-16 DIAGNOSIS — Z01818 Encounter for other preprocedural examination: Secondary | ICD-10-CM

## 2021-09-16 DIAGNOSIS — M1990 Unspecified osteoarthritis, unspecified site: Secondary | ICD-10-CM

## 2021-09-16 DIAGNOSIS — Z96692 Finger-joint replacement of left hand: Secondary | ICD-10-CM | POA: Diagnosis not present

## 2021-09-16 HISTORY — PX: FINGER ARTHROPLASTY: SHX5017

## 2021-09-16 SURGERY — ARTHROPLASTY, FINGER
Anesthesia: Monitor Anesthesia Care | Site: Finger | Laterality: Left

## 2021-09-16 MED ORDER — OXYCODONE HCL 5 MG/5ML PO SOLN: 5.0000 mg | Freq: Once | ORAL | Status: DC | PRN

## 2021-09-16 MED ORDER — MIDAZOLAM HCL 2 MG/2ML IJ SOLN
INTRAMUSCULAR | Status: AC
  Filled 2021-09-16: qty 2

## 2021-09-16 MED ORDER — OXYCODONE HCL 5 MG PO TABS: 5.0000 mg | ORAL_TABLET | Freq: Once | ORAL | Status: DC | PRN

## 2021-09-16 MED ORDER — VANCOMYCIN HCL 500 MG/100ML IV SOLN
INTRAVENOUS | Status: AC
  Filled 2021-09-16: qty 100

## 2021-09-16 MED ORDER — MELOXICAM 15 MG PO TABS: 15.0000 mg | ORAL_TABLET | Freq: Every day | ORAL | 1 refills | Status: DC

## 2021-09-16 MED ORDER — FENTANYL CITRATE (PF) 100 MCG/2ML IJ SOLN
100.0000 ug | Freq: Once | INTRAMUSCULAR | Status: AC
  Administered 2021-09-16: 100 ug via INTRAVENOUS

## 2021-09-16 MED ORDER — BUPIVACAINE-EPINEPHRINE (PF) 0.5% -1:200000 IJ SOLN
INTRAMUSCULAR | Status: DC | PRN
  Administered 2021-09-16: 30 mL via PERINEURAL

## 2021-09-16 MED ORDER — FENTANYL CITRATE (PF) 100 MCG/2ML IJ SOLN
INTRAMUSCULAR | Status: AC
  Filled 2021-09-16: qty 2

## 2021-09-16 MED ORDER — ONDANSETRON HCL 4 MG/2ML IJ SOLN: 4.0000 mg | Freq: Four times a day (QID) | INTRAMUSCULAR | Status: DC | PRN

## 2021-09-16 MED ORDER — VANCOMYCIN HCL IN DEXTROSE 1-5 GM/200ML-% IV SOLN
INTRAVENOUS | Status: AC
  Filled 2021-09-16: qty 200

## 2021-09-16 MED ORDER — FENTANYL CITRATE (PF) 100 MCG/2ML IJ SOLN: 25.0000 ug | INTRAMUSCULAR | Status: DC | PRN

## 2021-09-16 MED ORDER — ONDANSETRON HCL 4 MG PO TABS: 4.0000 mg | ORAL_TABLET | Freq: Every day | ORAL | 1 refills | Status: DC | PRN

## 2021-09-16 MED ORDER — LACTATED RINGERS IV SOLN: INTRAVENOUS | Status: DC

## 2021-09-16 MED ORDER — OXYCODONE HCL 5 MG PO TABS: 5.0000 mg | ORAL_TABLET | Freq: Four times a day (QID) | ORAL | 0 refills | Status: DC | PRN

## 2021-09-16 MED ORDER — MIDAZOLAM HCL 2 MG/2ML IJ SOLN
2.0000 mg | Freq: Once | INTRAMUSCULAR | Status: AC
  Administered 2021-09-16: 2 mg via INTRAVENOUS

## 2021-09-16 MED ORDER — PROPOFOL 500 MG/50ML IV EMUL
INTRAVENOUS | Status: DC | PRN
  Administered 2021-09-16: 75 ug/kg/min via INTRAVENOUS

## 2021-09-16 SURGICAL SUPPLY — 59 items
APL PRP STRL LF DISP 70% ISPRP (MISCELLANEOUS) ×1
BLADE MINI RND TIP GREEN BEAV (BLADE) IMPLANT
BLADE OSC/SAG .038X5.5 CUT EDG (BLADE) ×1 IMPLANT
BLADE SURG 15 STRL LF DISP TIS (BLADE) ×1 IMPLANT
BLADE SURG 15 STRL SS (BLADE) ×2
BNDG CMPR 5X4 CHSV STRCH STRL (GAUZE/BANDAGES/DRESSINGS) ×1
BNDG CMPR 9X4 STRL LF SNTH (GAUZE/BANDAGES/DRESSINGS)
BNDG COHESIVE 4X5 TAN STRL LF (GAUZE/BANDAGES/DRESSINGS) ×1 IMPLANT
BNDG ESMARK 4X9 LF (GAUZE/BANDAGES/DRESSINGS) IMPLANT
BNDG GAUZE DERMACEA FLUFF 4 (GAUZE/BANDAGES/DRESSINGS) ×1 IMPLANT
BNDG GZE DERMACEA 4 6PLY (GAUZE/BANDAGES/DRESSINGS) ×1
BUR EGG 3PK/BX (BURR) IMPLANT
CHLORAPREP W/TINT 26 (MISCELLANEOUS) ×1 IMPLANT
CORD BIPOLAR FORCEPS 12FT (ELECTRODE) ×1 IMPLANT
COVER BACK TABLE 60X90IN (DRAPES) ×1 IMPLANT
COVER MAYO STAND STRL (DRAPES) ×1 IMPLANT
CUFF TOURN SGL QUICK 18X4 (TOURNIQUET CUFF) IMPLANT
DRAPE C-ARM 42X72 X-RAY (DRAPES) ×1 IMPLANT
DRAPE EXTREMITY T 121X128X90 (DISPOSABLE) ×1 IMPLANT
DRAPE SURG 17X23 STRL (DRAPES) ×1 IMPLANT
DRSG EMULSION OIL 3X3 NADH (GAUZE/BANDAGES/DRESSINGS) ×1 IMPLANT
GAUZE SPONGE 4X4 12PLY STRL LF (GAUZE/BANDAGES/DRESSINGS) ×1 IMPLANT
GLOVE BIO SURGEON STRL SZ7.5 (GLOVE) ×1 IMPLANT
GLOVE BIOGEL PI IND STRL 7.0 (GLOVE) ×1 IMPLANT
GLOVE BIOGEL PI IND STRL 8 (GLOVE) ×1 IMPLANT
GLOVE ECLIPSE 6.5 STRL STRAW (GLOVE) ×1 IMPLANT
GOWN STRL REUS W/ TWL LRG LVL3 (GOWN DISPOSABLE) ×2 IMPLANT
GOWN STRL REUS W/TWL LRG LVL3 (GOWN DISPOSABLE) ×2
GOWN STRL REUS W/TWL XL LVL3 (GOWN DISPOSABLE) ×1 IMPLANT
IMPL SILICONE MCP SZ30 (Orthopedic Implant) IMPLANT
IMPLANT SILICONE MCP SZ30 (Orthopedic Implant) ×1 IMPLANT
LOOP VESSEL MAXI BLUE (MISCELLANEOUS) IMPLANT
LOOP VESSEL MINI RED (MISCELLANEOUS) IMPLANT
NDL HYPO 25X1 1.5 SAFETY (NEEDLE) IMPLANT
NEEDLE HYPO 25X1 1.5 SAFETY (NEEDLE) IMPLANT
NS IRRIG 1000ML POUR BTL (IV SOLUTION) ×1 IMPLANT
PACK BASIN DAY SURGERY FS (CUSTOM PROCEDURE TRAY) ×1 IMPLANT
PADDING CAST ABS COTTON 3X4 (CAST SUPPLIES) IMPLANT
PADDING CAST ABS COTTON 4X4 ST (CAST SUPPLIES) ×1 IMPLANT
SLEEVE SCD COMPRESS KNEE MED (STOCKING) IMPLANT
SPIKE FLUID TRANSFER (MISCELLANEOUS) IMPLANT
SPLINT PLASTER CAST XFAST 3X15 (CAST SUPPLIES) IMPLANT
SPLINT PLASTER CAST XFAST 4X15 (CAST SUPPLIES) ×1 IMPLANT
STOCKINETTE 6  STRL (DRAPES) ×1
STOCKINETTE 6 STRL (DRAPES) ×1 IMPLANT
SUT FIBERWIRE 2-0 18 17.9 3/8 (SUTURE)
SUT FIBERWIRE 3-0 18 TAPR NDL (SUTURE) ×3
SUT SILK 4 0 P 3 (SUTURE) IMPLANT
SUT VIC AB 3-0 SH 27 (SUTURE) ×1
SUT VIC AB 3-0 SH 27X BRD (SUTURE) IMPLANT
SUT VICRYL 4-0 PS2 18IN ABS (SUTURE) ×1 IMPLANT
SUT VICRYL RAPIDE 4-0 (SUTURE) IMPLANT
SUT VICRYL RAPIDE 4/0 PS 2 (SUTURE) IMPLANT
SUTURE FIBERWR 2-0 18 17.9 3/8 (SUTURE) IMPLANT
SUTURE FIBERWR 3-0 18 TAPR NDL (SUTURE) IMPLANT
SYR 10ML LL (SYRINGE) IMPLANT
SYR BULB EAR ULCER 3OZ GRN STR (SYRINGE) ×1 IMPLANT
TOWEL GREEN STERILE FF (TOWEL DISPOSABLE) ×1 IMPLANT
UNDERPAD 30X36 HEAVY ABSORB (UNDERPADS AND DIAPERS) ×1 IMPLANT

## 2021-09-16 NOTE — Progress Notes (Signed)
Assisted Dr. Marcie Bal with left, supraclavicular, ultrasound guided block. Side rails up, monitors on throughout procedure. See vital signs in flow sheet. Tolerated Procedure well.

## 2021-09-16 NOTE — Anesthesia Postprocedure Evaluation (Signed)
Anesthesia Post Note  Patient: Walter White.  Procedure(s) Performed: LEFT INDEX FINGER METACARPOPHALANGEAL JOINT REVISION ARTHROPLASTY (Left: Finger)     Patient location during evaluation: PACU Anesthesia Type: Regional and MAC Level of consciousness: awake and alert Pain management: pain level controlled Vital Signs Assessment: post-procedure vital signs reviewed and stable Respiratory status: spontaneous breathing, nonlabored ventilation, respiratory function stable and patient connected to nasal cannula oxygen Cardiovascular status: stable and blood pressure returned to baseline Postop Assessment: no apparent nausea or vomiting Anesthetic complications: no   No notable events documented.  Last Vitals:  Vitals:   09/16/21 1030 09/16/21 1108  BP:  122/66  Pulse: 69 66  Resp: 15 16  Temp:  36.7 C  SpO2: 98% 95%    Last Pain:  Vitals:   09/16/21 1108  TempSrc: Oral  PainSc: 0-No pain                 Sair Faulcon S

## 2021-09-16 NOTE — Anesthesia Preprocedure Evaluation (Signed)
Anesthesia Evaluation  Patient identified by MRN, date of birth, ID band Patient awake    Reviewed: Allergy & Precautions, H&P , NPO status , Patient's Chart, lab work & pertinent test results  Airway Mallampati: II   Neck ROM: full    Dental   Pulmonary former smoker,    breath sounds clear to auscultation       Cardiovascular hypertension, + Peripheral Vascular Disease   Rhythm:regular Rate:Normal     Neuro/Psych  Neuromuscular disease    GI/Hepatic GERD  ,  Endo/Other  diabetes  Renal/GU      Musculoskeletal  (+) Arthritis ,   Abdominal   Peds  Hematology MGUS   Anesthesia Other Findings   Reproductive/Obstetrics                             Anesthesia Physical Anesthesia Plan  ASA: 3  Anesthesia Plan: MAC and Regional   Post-op Pain Management:    Induction: Intravenous  PONV Risk Score and Plan: 1 and Propofol infusion and Treatment may vary due to age or medical condition  Airway Management Planned: Simple Face Mask  Additional Equipment:   Intra-op Plan:   Post-operative Plan:   Informed Consent: I have reviewed the patients History and Physical, chart, labs and discussed the procedure including the risks, benefits and alternatives for the proposed anesthesia with the patient or authorized representative who has indicated his/her understanding and acceptance.     Dental advisory given  Plan Discussed with: CRNA, Anesthesiologist and Surgeon  Anesthesia Plan Comments:         Anesthesia Quick Evaluation

## 2021-09-16 NOTE — Anesthesia Procedure Notes (Addendum)
  Anesthesia Regional Block: Supraclavicular block   Pre-Anesthetic Checklist: , timeout performed,  Correct Patient, Correct Site, Correct Laterality,  Correct Procedure, Correct Position, site marked,  Risks and benefits discussed,  Surgical consent,  Pre-op evaluation,  At surgeon's request and post-op pain management  Laterality: Left  Prep: chloraprep       Needles:  Injection technique: Single-shot  Needle Type: Echogenic Stimulator Needle     Needle Length: 5cm  Needle Gauge: 22     Additional Needles:   Procedures:, nerve stimulator,,,,,     Nerve Stimulator or Paresthesia:  Response: biceps flexion, 0.45 mA  Additional Responses:   Narrative:  Start time: 09/16/2021 8:00 AM End time: 09/16/2021 8:10 AM Injection made incrementally with aspirations every 5 mL.  Performed by: Personally  Anesthesiologist: Albertha Ghee, MD  Additional Notes: Functioning IV was confirmed and monitors were applied.  A 36m 22ga Arrow echogenic stimulator needle was used. Sterile prep and drape,hand hygiene and sterile gloves were used.  Negative aspiration and negative test dose prior to incremental administration of local anesthetic. The patient tolerated the procedure well.  Ultrasound guidance: relevent anatomy identified, needle position confirmed, local anesthetic spread visualized around nerve(s), vascular puncture avoided.  Image printed for medical record.

## 2021-09-16 NOTE — Discharge Instructions (Addendum)
Discharge Instructions   You have a dressing with a plaster splint incorporated in it. Move your fingers as much as possible, making a full fist and fully opening the fist. Elevate your hand to reduce pain & swelling of the digits.  Ice over the operative site may be helpful to reduce pain & swelling.  DO NOT USE HEAT. Pain medicine has been prescribed for you.  Take Tylenol 650 mg every 6 hours. Take 1 tablet of Meloxicam per day as prescribed. Take Zofran for nausea and Oxycodone for severe post operative pain as a rescue medicine. Leave the dressing in place until you return to our office.  You may shower, but keep the bandage clean & dry.  You may drive a car when you are off of prescription pain medications and can safely control your vehicle with both hands. Our office will call you to arrange follow-up   Please call [phone] during normal business hours or [phone] after hours for any problems. Including the following:  - excessive redness of the incisions - drainage for more than 4 days - fever of more than 101.5 F  *Please note that pain medications will not be refilled after hours or on weekends.    Post Anesthesia Home Care Instructions  Activity: Get plenty of rest for the remainder of the day. A responsible individual must stay with you for 24 hours following the procedure.  For the next 24 hours, DO NOT: -Drive a car -Paediatric nurse -Drink alcoholic beverages -Take any medication unless instructed by your physician -Make any legal decisions or sign important papers.  Meals: Start with liquid foods such as gelatin or soup. Progress to regular foods as tolerated. Avoid greasy, spicy, heavy foods. If nausea and/or vomiting occur, drink only clear liquids until the nausea and/or vomiting subsides. Call your physician if vomiting continues.  Special Instructions/Symptoms: Your throat may feel dry or sore from the anesthesia or the breathing tube placed in  your throat during surgery. If this causes discomfort, gargle with warm salt water. The discomfort should disappear within 24 hours.       Regional Anesthesia Blocks  1. Numbness or the inability to move the "blocked" extremity may last from 3-48 hours after placement. The length of time depends on the medication injected and your individual response to the medication. If the numbness is not going away after 48 hours, call your surgeon.  2. The extremity that is blocked will need to be protected until the numbness is gone and the  Strength has returned. Because you cannot feel it, you will need to take extra care to avoid injury. Because it may be weak, you may have difficulty moving it or using it. You may not know what position it is in without looking at it while the block is in effect.  3. For blocks in the legs and feet, returning to weight bearing and walking needs to be done carefully. You will need to wait until the numbness is entirely gone and the strength has returned. You should be able to move your leg and foot normally before you try and bear weight or walk. You will need someone to be with you when you first try to ensure you do not fall and possibly risk injury.  4. Bruising and tenderness at the needle site are common side effects and will resolve in a few days.  5. Persistent numbness or new problems with movement should be communicated to the surgeon or the [facility] [phone] [place] [phone]).

## 2021-09-16 NOTE — Op Note (Signed)
09/16/2021  7:43 AM  PATIENT:  Walter White.  71 y.o. male  PRE-OPERATIVE DIAGNOSIS: Left index finger MCP prosthetic joint subluxation  POST-OPERATIVE DIAGNOSIS:  Same  PROCEDURE: Left index finger MCP and LAT arthroplasty revision from pyrocarbon to 1 piece silicone  SURGEON: Rayvon Char. Grandville Silos, MD  PHYSICIAN ASSISTANT: Audelia Acton, [place]  ANESTHESIA: Regional/MAC  SPECIMENS:  None  DRAINS:   None  EBL: Less than 10 mL  PREOPERATIVE INDICATIONS:  [name]. is a  71 y.o. male with a history several months ago of left index finger implant arthroplasty with placement of 2 piece pyrocarbon implant, who developed volar subluxation postoperatively, accompanied by pain and squeaking.  Discussion was held with him regarding options including revision to one-piece silicone implant, with additional soft tissue balancing.  The risks benefits and alternatives were discussed with the patient preoperatively including but not limited to the risks of infection, bleeding, nerve injury, cardiopulmonary complications, the need for revision surgery, among others, and the patient verbalized understanding and consented to proceed.  OPERATIVE IMPLANTS: Integra silicone MCP size 30  OPERATIVE PROCEDURE:  After receiving prophylactic antibiotics and a regional block, the patient was escorted to the operative theatre and placed in a supine position.  A surgical "time-out" was performed during which the planned procedure, proposed operative site, and the correct patient identity were compared to the operative consent and agreement confirmed by the circulating nurse according to current facility policy.  Following application of a tourniquet to the operative extremity, the exposed skin was prepped with Chloraprep and draped in the usual sterile fashion.  The limb was exsanguinated with an Esmarch bandage and the tourniquet inflated to approximately 12mHg higher than systolic BP.  A  curvilinear incision was fashioned over the dorsal aspect of the index MCP.  He was made sharply with a scalpel.  Full-thickness flaps were elevated.  The extensor apparatus was divided in its midline and reflected radially and ulnarly.  The capsule was similarly divided.  The pyrocarbon implants were inspected and the 2 parts removed.  The anterior of the joint was debrided to some degree of thickened synovial tissue with a rondure.  0.035 inch K wire was used to create 2 holes for suture repair on the dorsal radial aspect of the distal portion of the metacarpal, as well as dorsal ulnarly.  3-0 FiberWire suture was placed on the volar ulnar side of the collaterals and capsule, and the 2 tails in a horizontal mattress fashion brought through the 2 drill holes on the dorsal ulnar aspect of the metacarpal.  On the radial side, 2 different sutures were placed, 1 more volarly and 1 more radially.  When these were pulled tight, it did nicely reduce the tendency for the proximal phalanx to subluxate volarly, and it created good restraint against ulnarly directed stress.  The size 30 silicone trial was placed and had good fit and fill.  Full extension was possible.  The digit tended to rest nicely slightly flexed and without a tendency to drift radially ulnarly.  Everything was copiously irrigated as a trial was removed and a size 30 silicone implant placed.  Once it was placed, the collateral reinforcing sutures were tied with the joint held in extension and slightly radially deviated.  The radial most 1 was tied first, followed by the one that was radial volar, and lastly the ulnar side.  The capsule was then closed with running 3-0 Vicryl suture.  The joint was examined fluoroscopically and  no subluxation was present.  It rested with a nice symmetrical joint space on the AP.  The extensor apparatus was then repaired with 3-0 Vicryl running suture and the tourniquet was released.  Additional hemostasis was obtained with  bipolar electrocautery and then the skin was reapproximated with 4-0 Vicryl Rapide running horizontal mattress sutures.  A short arm splint dressing was applied, with volar plaster supporting the wrist in slight extension and the MPs in extension, and some of the Webril and Coban was used to help provide a little radially directed posture to the index MP.  He was taken to the recovery room in stable condition.  DISPOSITION: He will be discharged home today with typical postop instructions, and within the next week to 10 days return for reevaluation and also begin the therapy program again.

## 2021-09-16 NOTE — Transfer of Care (Signed)
Immediate Anesthesia Transfer of Care Note  Patient: Walter White.  Procedure(s) Performed: LEFT INDEX FINGER METACARPOPHALANGEAL JOINT REVISION ARTHROPLASTY (Left: Finger)  Patient Location: PACU  Anesthesia Type:MAC and Regional  Level of Consciousness: awake, alert  and oriented  Airway & Oxygen Therapy: Patient Spontanous Breathing and Patient connected to face mask oxygen  Post-op Assessment: Report given to RN and Post -op Vital signs reviewed and stable  Post vital signs: Reviewed and stable  Last Vitals:  Vitals Value Taken Time  BP    Temp    Pulse 66 09/16/21 1017  Resp 14 09/16/21 1017  SpO2 95 % 09/16/21 1017  Vitals shown include unvalidated device data.  Last Pain:  Vitals:   09/16/21 0725  TempSrc: Oral  PainSc: 6          Complications: No notable events documented.

## 2021-09-16 NOTE — Interval H&P Note (Signed)
History and Physical Interval Note:  09/16/2021 7:42 AM  Walter White.  has presented today for surgery, with the diagnosis of LEFT INDEX FINGER SUBLUXATED JOINT REPLACEMENT.  The various methods of treatment have been discussed with the patient and family. After consideration of risks, benefits and other options for treatment, the patient has consented to  Procedure(s): LEFT INDEX FINGER METACARPOPHALANGEAL JOINT REVISION ARTHROPLASTY (Left) as a surgical intervention.  The patient's history has been reviewed, patient examined, no change in status, stable for surgery.  I have reviewed the patient's chart and labs.  Questions were answered to the patient's satisfaction.     [name]

## 2021-09-19 ENCOUNTER — Encounter (HOSPITAL_BASED_OUTPATIENT_CLINIC_OR_DEPARTMENT_OTHER): Payer: Self-pay | Admitting: Orthopedic Surgery

## 2021-09-22 DIAGNOSIS — E785 Hyperlipidemia, unspecified: Secondary | ICD-10-CM | POA: Diagnosis not present

## 2021-09-22 DIAGNOSIS — Z23 Encounter for immunization: Secondary | ICD-10-CM | POA: Diagnosis not present

## 2021-09-22 DIAGNOSIS — I2510 Atherosclerotic heart disease of native coronary artery without angina pectoris: Secondary | ICD-10-CM | POA: Diagnosis not present

## 2021-09-22 DIAGNOSIS — I10 Essential (primary) hypertension: Secondary | ICD-10-CM | POA: Diagnosis not present

## 2021-09-22 DIAGNOSIS — E1122 Type 2 diabetes mellitus with diabetic chronic kidney disease: Secondary | ICD-10-CM | POA: Diagnosis not present

## 2021-09-23 DIAGNOSIS — R260 Ataxic gait: Secondary | ICD-10-CM | POA: Diagnosis not present

## 2021-09-23 DIAGNOSIS — H8110 Benign paroxysmal vertigo, unspecified ear: Secondary | ICD-10-CM | POA: Diagnosis not present

## 2021-09-26 DIAGNOSIS — H8110 Benign paroxysmal vertigo, unspecified ear: Secondary | ICD-10-CM | POA: Diagnosis not present

## 2021-09-26 DIAGNOSIS — R260 Ataxic gait: Secondary | ICD-10-CM | POA: Diagnosis not present

## 2021-09-28 DIAGNOSIS — H8110 Benign paroxysmal vertigo, unspecified ear: Secondary | ICD-10-CM | POA: Diagnosis not present

## 2021-09-28 DIAGNOSIS — R260 Ataxic gait: Secondary | ICD-10-CM | POA: Diagnosis not present

## 2021-09-29 DIAGNOSIS — Z9889 Other specified postprocedural states: Secondary | ICD-10-CM | POA: Diagnosis not present

## 2021-09-29 DIAGNOSIS — T84220D Displacement of internal fixation device of bones of hand and fingers, subsequent encounter: Secondary | ICD-10-CM | POA: Diagnosis not present

## 2021-10-03 DIAGNOSIS — M19042 Primary osteoarthritis, left hand: Secondary | ICD-10-CM | POA: Diagnosis not present

## 2021-10-04 DIAGNOSIS — R260 Ataxic gait: Secondary | ICD-10-CM | POA: Diagnosis not present

## 2021-10-04 DIAGNOSIS — Z96651 Presence of right artificial knee joint: Secondary | ICD-10-CM | POA: Diagnosis not present

## 2021-10-04 DIAGNOSIS — H8110 Benign paroxysmal vertigo, unspecified ear: Secondary | ICD-10-CM | POA: Diagnosis not present

## 2021-10-04 DIAGNOSIS — M222X1 Patellofemoral disorders, right knee: Secondary | ICD-10-CM | POA: Diagnosis not present

## 2021-10-05 DIAGNOSIS — M19042 Primary osteoarthritis, left hand: Secondary | ICD-10-CM | POA: Diagnosis not present

## 2021-10-11 DIAGNOSIS — M19042 Primary osteoarthritis, left hand: Secondary | ICD-10-CM | POA: Diagnosis not present

## 2021-10-13 DIAGNOSIS — M19042 Primary osteoarthritis, left hand: Secondary | ICD-10-CM | POA: Diagnosis not present

## 2021-10-14 DIAGNOSIS — R260 Ataxic gait: Secondary | ICD-10-CM | POA: Diagnosis not present

## 2021-10-14 DIAGNOSIS — H8110 Benign paroxysmal vertigo, unspecified ear: Secondary | ICD-10-CM | POA: Diagnosis not present

## 2021-10-17 DIAGNOSIS — H8110 Benign paroxysmal vertigo, unspecified ear: Secondary | ICD-10-CM | POA: Diagnosis not present

## 2021-10-17 DIAGNOSIS — R260 Ataxic gait: Secondary | ICD-10-CM | POA: Diagnosis not present

## 2021-10-18 DIAGNOSIS — M19042 Primary osteoarthritis, left hand: Secondary | ICD-10-CM | POA: Diagnosis not present

## 2021-10-19 DIAGNOSIS — H0102A Squamous blepharitis right eye, upper and lower eyelids: Secondary | ICD-10-CM | POA: Diagnosis not present

## 2021-10-19 DIAGNOSIS — Z961 Presence of intraocular lens: Secondary | ICD-10-CM | POA: Diagnosis not present

## 2021-10-19 DIAGNOSIS — H0102B Squamous blepharitis left eye, upper and lower eyelids: Secondary | ICD-10-CM | POA: Diagnosis not present

## 2021-10-19 DIAGNOSIS — R260 Ataxic gait: Secondary | ICD-10-CM | POA: Diagnosis not present

## 2021-10-19 DIAGNOSIS — H8110 Benign paroxysmal vertigo, unspecified ear: Secondary | ICD-10-CM | POA: Diagnosis not present

## 2021-10-19 DIAGNOSIS — H43812 Vitreous degeneration, left eye: Secondary | ICD-10-CM | POA: Diagnosis not present

## 2021-10-19 DIAGNOSIS — H04123 Dry eye syndrome of bilateral lacrimal glands: Secondary | ICD-10-CM | POA: Diagnosis not present

## 2021-10-19 DIAGNOSIS — H33312 Horseshoe tear of retina without detachment, left eye: Secondary | ICD-10-CM | POA: Diagnosis not present

## 2021-10-20 ENCOUNTER — Ambulatory Visit: Payer: Medicare Other | Admitting: Cardiology

## 2021-10-20 ENCOUNTER — Encounter: Payer: Self-pay | Admitting: Cardiology

## 2021-10-20 VITALS — BP 157/90 | HR 78 | Temp 98.2°F | Resp 16 | Ht 68.0 in | Wt 240.6 lb

## 2021-10-20 DIAGNOSIS — R931 Abnormal findings on diagnostic imaging of heart and coronary circulation: Secondary | ICD-10-CM

## 2021-10-20 DIAGNOSIS — M19042 Primary osteoarthritis, left hand: Secondary | ICD-10-CM | POA: Diagnosis not present

## 2021-10-20 DIAGNOSIS — E1165 Type 2 diabetes mellitus with hyperglycemia: Secondary | ICD-10-CM

## 2021-10-20 DIAGNOSIS — I77810 Thoracic aortic ectasia: Secondary | ICD-10-CM

## 2021-10-20 DIAGNOSIS — I2510 Atherosclerotic heart disease of native coronary artery without angina pectoris: Secondary | ICD-10-CM

## 2021-10-20 DIAGNOSIS — I10 Essential (primary) hypertension: Secondary | ICD-10-CM

## 2021-10-20 DIAGNOSIS — I700 Atherosclerosis of aorta: Secondary | ICD-10-CM

## 2021-10-20 DIAGNOSIS — E1169 Type 2 diabetes mellitus with other specified complication: Secondary | ICD-10-CM | POA: Diagnosis not present

## 2021-10-20 DIAGNOSIS — I714 Abdominal aortic aneurysm, without rupture, unspecified: Secondary | ICD-10-CM

## 2021-10-20 DIAGNOSIS — I2584 Coronary atherosclerosis due to calcified coronary lesion: Secondary | ICD-10-CM | POA: Diagnosis not present

## 2021-10-20 MED ORDER — ASPIRIN 81 MG PO TBEC: 81.0000 mg | DELAYED_RELEASE_TABLET | Freq: Every day | ORAL | 12 refills | Status: DC

## 2021-10-20 NOTE — Progress Notes (Signed)
ID:  Walter White., DOB 03-Sep-1950, MRN [id number]  PCP:  Deland Pretty, MD  Cardiologist:  Rex Kras, DO, [facility] (established care 10/20/21)   REASON FOR CONSULT: Severe coronary artery calcification  REQUESTING PHYSICIAN:  Deland Pretty, MD [address] [place],  [place] [address]  Chief Complaint  Patient presents with    Atherosclerotic heart disease of native coronary artery wi   New Patient (Initial Visit)    HPI  Walter White. is a 71 y.o. Caucasian male who presents to the clinic for evaluation of severe coronary artery calcification at the request of Deland Pretty, MD. 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   Patient was referred to the practice for evaluation of severe coronary artery calcification.  Patient states that he was asked to have a coronary calcium score for further risk stratification.  He currently denies any any anginal discomfort or heart failure symptoms.  His overall functional capacity is limited due to several back surgeries in the past and also left arm surgeries (recently).  No prior cardiovascular work-up.  FUNCTIONAL STATUS: No structured exercise program or daily routine.    ALLERGIES: Allergies  Allergen Reactions   Pork-Derived Products Shortness Of Breath   Shellfish Allergy Shortness Of Breath   Lovastatin Other (See Comments)    Bone pain    Penicillins     UNSPECIFIED CHILDHOOD REACTION  Has patient had a PCN reaction causing immediate rash, facial/tongue/throat swelling, SOB or lightheadedness with hypotension: Unknown Has patient had a PCN reaction causing severe rash involving mucus membranes or skin necrosis: Unknown Has patient had a  PCN reaction that required hospitalization: Unknown Has patient had a PCN reaction occurring within the last 10 years: No If all of the above answers are "NO", then may proceed with Cephalosporin use.     MEDICATION LIST PRIOR TO VISIT: Current Meds  Medication Sig   acetaminophen (TYLENOL) 650 MG CR tablet Take 650 mg by mouth every 8 (eight) hours as needed for pain.   aspirin EC 81 MG tablet Take 1 tablet (81 mg total) by mouth daily. Swallow whole.   Cholecalciferol (VITAMIN D) 125 MCG (5000 UT) CAPS Take 5,000 Units by mouth daily.   gabapentin (NEURONTIN) 600 MG tablet Take 1 tablet by mouth daily.   meloxicam (MOBIC) 15 MG tablet Take 1 tablet (15 mg total) by mouth daily.   olmesartan-hydrochlorothiazide (BENICAR HCT) 40-25 MG tablet Take 1 tablet by mouth daily.   pantoprazole (PROTONIX) 40 MG tablet Take 40 mg by mouth daily.   Psyllium (EQ DAILY FIBER PO) Take 5 tablets by mouth daily.   rosuvastatin (CRESTOR) 20 MG tablet Take 5 mg by mouth daily.   traMADol (ULTRAM) 50 MG tablet      PAST MEDICAL HISTORY: Past Medical History:  Diagnosis Date   AAA (abdominal aortic aneurysm) (HCC)    4.0 cm AAA 08/22/17 U/S   Allergy    Allergic Rhinitis   Anemia    Arthritis    spinal steniosis   Benign prostate hyperplasia    with lower urinary tract symptoms   Carpal tunnel syndrome on right    Cataracts, both eyes    Chronic back pain    caused by spinal stenosis   Elevated PSA    GERD (  gastroesophageal reflux disease)    Hyperlipidemia    Hypertension    Essential.     MGUS (monoclonal gammopathy of unknown significance)    Microhematuria    OA (osteoarthritis) of knee    Pneumonia    Hx -when very young   Pre-diabetes    Seasonal allergies    Spinal stenosis    of lumbosacral region.  Causes chronic back pain.   Upper abdominal pain     PAST SURGICAL HISTORY: Past Surgical History:  Procedure Laterality Date   ABDOMINAL EXPOSURE N/A 09/05/2017   Procedure:  ABDOMINAL EXPOSURE;  Surgeon: Rosetta Posner, MD;  Location: [facility] OR;  Service: Vascular;  Laterality: N/A;   ANTERIOR LAT LUMBAR FUSION Left 01/26/2016   Procedure: LEFT SIDED LATERAL INTERBODY FUSION, LUMBAR 3-4 WITH INSTRUMENTATION AND ALLOGRAFT;  Surgeon: Phylliss Bob, MD;  Location: [place];  Service: Orthopedics;  Laterality: Left;  LEFT SIDED LATERAL INTERBODY FUSION, LUMBAR 3-4 WITH INSTRUMENTATION AND ALLOGRAFT   ANTERIOR LAT LUMBAR FUSION Right 08/14/2018   Procedure: RIGHT LUMBAR two to LUMBAR three LATERAL INTERBODY FUSION WITH INSTRUMENTATION AND ALLOGRAFT;  Surgeon: Phylliss Bob, MD;  Location: [place];  Service: Orthopedics;  Laterality: Right;   ANTERIOR LATERAL LUMBAR FUSION WITH PERCUTANEOUS SCREW 1 LEVEL N/A 09/05/2017   Procedure: LUMBAR 5 - SACRUM 1 ANTERIOR LUMBAR INTERBODY FUSION WITH INSTRUMENTATION AND ALLOGRAFT;  Surgeon: Phylliss Bob, MD;  Location: [place];  Service: Orthopedics;  Laterality: N/A;   BACK SURGERY  01/2015   dr. Lynann Bologna. [phone].   CARPAL TUNNEL RELEASE     right    CATARACT EXTRACTION, BILATERAL     COLONOSCOPY     COLONOSCOPY  2010,2016   polyp   ELBOW SURGERY     right ulnar release   ELBOW SURGERY     LEFT CTR   EYE SURGERY     FINGER ARTHROPLASTY Left 04/04/2021   Procedure: LEFT INDEX FINGER IMPLANT ARTHROPLASTY AT METACARPOPHALANGEAL JOINT;  Surgeon: Milly Jakob, MD;  Location: [place];  Service: Orthopedics;  Laterality: Left;  PRE-OP BLOCK,   FINGER ARTHROPLASTY Left 09/16/2021   Procedure: LEFT INDEX FINGER METACARPOPHALANGEAL JOINT REVISION ARTHROPLASTY;  Surgeon: Milly Jakob, MD;  Location: [place];  Service: Orthopedics;  Laterality: Left;   JOINT REPLACEMENT     left- Sept 2016   KNEE ARTHROSCOPY  11/23/2010   Procedure: ARTHROSCOPY KNEE;  Surgeon: Kerin Salen;  Location: [place];  Service: Orthopedics;  Laterality: Left;  arthroscopy left knee   KNEE ARTHROSCOPY     left x 2    Knee scar tissue removal Left 2018   LATERAL FUSION LUMBAR SPINE  01./17/2018   polypectomy  2010   SHOULDER ARTHROSCOPY Left    x2   TONSILLECTOMY     TOTAL KNEE ARTHROPLASTY Left 09/21/2014   Procedure: TOTAL KNEE ARTHROPLASTY;  Surgeon: Frederik Pear, MD;  Location: [place];  Service: Orthopedics;  Laterality: Left;   ULNAR NERVE TRANSPOSITION Left 04/16/2017   Procedure: LEFT ULNAR NEUROPLASTY AT THE WRIST AND TRANSPOSITION AT THE ELBOW;  Surgeon: Milly Jakob, MD;  Location: [place];  Service: Orthopedics;  Laterality: Left;   WISDOM TOOTH EXTRACTION      FAMILY HISTORY: The patient family history includes Cancer in his mother; Colon cancer (age of onset: 35) in his maternal uncle; Liver disease in his father.  SOCIAL HISTORY:  The patient  reports that he quit smoking about 40 years ago. His smoking use  included cigarettes. He has a 5.00 pack-year smoking history. He has never used smokeless tobacco. He reports current alcohol use. He reports that he does not use drugs.  REVIEW OF SYSTEMS: Review of Systems  Cardiovascular:  Negative for chest pain, claudication, dyspnea on exertion, irregular heartbeat, leg swelling, near-syncope, orthopnea, palpitations, paroxysmal nocturnal dyspnea and syncope.  Respiratory:  Negative for shortness of breath.   Hematologic/Lymphatic: Negative for bleeding problem.  Musculoskeletal:  Positive for back pain (several back surgery). Negative for muscle cramps and myalgias.  Neurological:  Negative for dizziness and light-headedness.    PHYSICAL EXAM: Today's Vitals   10/20/21 1049 10/20/21 1050  BP: (!) 159/88 (!) 157/90  Pulse: 86 78  Resp: 16   Temp: 98.2 F (36.8 C)   TempSrc: Temporal   SpO2: 95% 94%  Weight: 240 lb 9.6 oz (109.1 kg)   Height: '5\' 8"'$  (1.727 m)    Body mass index is 36.58 kg/m.  Physical Exam  Constitutional: No distress.  Age appropriate, hemodynamically stable.   Neck: No JVD present.   Cardiovascular: Normal rate, regular rhythm, S1 normal, S2 normal and intact distal pulses. Exam reveals no gallop, no S3 and no S4.  No murmur heard. Pulses:      Radial pulses are 2+ on the right side and 2+ on the left side.       Dorsalis pedis pulses are 2+ on the right side and 2+ on the left side.       Posterior tibial pulses are 2+ on the right side and 2+ on the left side.  Pulmonary/Chest: Effort normal and breath sounds normal. No stridor. He has no wheezes. He has no rales.  Abdominal: Soft. Bowel sounds are normal. He exhibits no distension. There is no abdominal tenderness.  Musculoskeletal:        General: No edema.     Cervical back: Neck supple.  Neurological: He is alert and oriented to person, place, and time. He has intact cranial nerves (2-12).  Skin: Skin is warm and moist.   CARDIAC DATABASE: EKG: 10/20/2021: NSR, 80bpm, left axis, without underlying injury pattern.   Echocardiogram: No results found for this or any previous visit from the past 1095 days.    Stress Testing: No results found for this or any previous visit from the past 1095 days.   Heart Catheterization: None  Coronary artery calcium score: 07/20/2021 Left Main: 0   LAD: 587   LCx: 89.3   RCA: 470   Total Agatston Score: 1455   MESA database percentile: 88   AORTA MEASUREMENTS:   Ascending Aorta: 42 cm   Descending Aorta: 32 cm 1. Total Agatston score of 1455. This places the patient in the 88th percentile for age, gender and race/ethnicity. 2. Aortic atherosclerosis. 3. Mild aneurysmal dilation of the ascending thoracic aorta.  Recommend annual imaging followup by CTA or MRA  [place] AAA Duplex limited: 12/01/2021: Abdominal Aorta: There is evidence of abnormal dilatation of the proximal Abdominal aorta. The largest aortic measurement is 4.0 cm. The largest aortic diameter remains essentially unchanged compared to prior exam.  Previous diameter measurement was 4.0 cm  obtained on 03/31/2019.   LABORATORY DATA:    Latest Ref Rng & Units 06/01/2021    9:35 AM 05/25/2021    9:08 AM 10/14/2020    8:44 AM  CBC  WBC 4.0 - 10.5 K/uL 2.9  3.7  3.6   Hemoglobin 13.0 - 17.0 g/dL 10.9  11.4  11.5   Hematocrit  39.0 - 52.0 % 32.2  33.4  34.6   Platelets 150 - 400 K/uL 131  137  153        Latest Ref Rng & Units 09/14/2021   12:38 PM 05/25/2021    9:08 AM 03/30/2021    9:54 AM  CMP  Glucose 70 - 99 mg/dL 162  135  154   BUN 8 - 23 mg/dL 27  24  33   Creatinine 0.61 - 1.24 mg/dL 1.10  1.01  1.10   Sodium 135 - 145 mmol/L 134  135  136   Potassium 3.5 - 5.1 mmol/L 4.3  4.6  4.0   Chloride 98 - 111 mmol/L 103  103  105   CO2 22 - 32 mmol/L '23  28  25   '$ Calcium 8.9 - 10.3 mg/dL 9.5  9.8  9.7   Total Protein 6.5 - 8.1 g/dL  9.0    Total Bilirubin 0.3 - 1.2 mg/dL  0.6    Alkaline Phos 38 - 126 U/L  91    AST 15 - 41 U/L  24    ALT 0 - 44 U/L  19      Lipid Panel  No results found for: "CHOL", "TRIG", "HDL", "CHOLHDL", "VLDL", "LDLCALC", "LDLDIRECT", "LABVLDL"  No components found for: "NTPROBNP" No results for input(s): "PROBNP" in the last 8760 hours. No results for input(s): "TSH" in the last 8760 hours.  BMP Recent Labs    03/30/21 0954 05/25/21 0908 09/14/21 1238  NA 136 135 134*  K 4.0 4.6 4.3  CL 105 103 103  CO2 '25 28 23  '$ GLUCOSE 154* 135* 162*  BUN 33* 24* 27*  CREATININE 1.10 1.01 1.10  CALCIUM 9.7 9.8 9.5  GFRNONAA >60 >60 >60    HEMOGLOBIN A1C Lab Results  Component Value Date   HGBA1C 6.2 (H) 08/28/2017   MPG 131.24 08/28/2017    IMPRESSION:    ICD-10-CM   1. Coronary atherosclerosis due to calcified coronary lesion  I25.10 EKG 12-Lead   I25.84 ECHOCARDIOGRAM COMPLETE    PCV MYOCARDIAL PERFUSION WITH LEXISCAN    aspirin EC 81 MG tablet    2. Agatston CAC score, >400  R93.1 ECHOCARDIOGRAM COMPLETE    PCV MYOCARDIAL PERFUSION WITH LEXISCAN    aspirin EC 81 MG tablet    3. Atherosclerosis of aorta (HCC)  I70.0     4.  Ascending aorta dilation (HCC)  I77.810     5. Benign hypertension  I10     6. Type 2 diabetes mellitus with hyperglycemia, without long-term current use of insulin (HCC)  E11.65     7. Type 2 diabetes mellitus with hyperlipidemia (HCC)  E11.69    E78.5     8. Abdominal aortic aneurysm (AAA) without rupture, unspecified part ([place])  I71.40        RECOMMENDATIONS: Roczen Waymire. is a 71 y.o. 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   Coronary atherosclerosis due to calcified coronary lesion / Agatston CAC score, >400 / Atherosclerosis of aorta (HCC) Total CAC 1455, 88 percentile Denies angina pectoris Start aspirin 81 mg p.o. daily. Currently on rosuvastatin 20 mg p.o. nightly. Echo will be ordered to evaluate for structural heart disease and left ventricular systolic function. We will proceed with pharmacological stress  test to evaluate for reversible ischemia.  EKG interpretable but patient unable to exercise.  Ascending aorta dilation (HCC) Noted on coronary calcium score dated July/2023. Reemphasized the importance of blood pressure management. Radiology recommends annual follow-up -currently managed by primary care provider.  Benign hypertension Office blood pressures are not well controlled. Likely secondary to ongoing pain-she ran out of pain medication several days ago. When the blood pressures are well controlled it is around 125/80 Medications reconciled Reemphasized the importance of low-salt diet Monitor for now  Type 2 diabetes mellitus with hyperglycemia, without long-term current use of insulin ([place]) Patient is emphasized on the importance of glycemic control. Currently on ARB,  statin therapy. May consider Jardiance if clinically indicated-we will defer to PCP  Type 2 diabetes mellitus with hyperlipidemia ([place]) Currently on rosuvastatin.   He denies myalgia or other side effects. No recent labs available for review. Recommended goal LDL at least less than 70 mg/dL and if able closer to 55 mg/dL given his severe CAC and diabetes Currently managed by primary care provider.  Abdominal aortic aneurysm (AAA) without rupture, unspecified part ([place]) Reemphasized importance of blood pressure management. Follows up with vascular surgery.  FINAL MEDICATION LIST END OF ENCOUNTER: Meds ordered this encounter  Medications   aspirin EC 81 MG tablet    Sig: Take 1 tablet (81 mg total) by mouth daily. Swallow whole.    Dispense:  30 tablet    Refill:  12    Medications Discontinued During This Encounter  Medication Reason   gabapentin (NEURONTIN) 100 MG capsule Change in therapy   acetaminophen (TYLENOL) 325 MG tablet Change in therapy   ondansetron (ZOFRAN) 4 MG tablet    oxyCODONE (ROXICODONE) 5 MG immediate release tablet    lisinopril-hydrochlorothiazide (PRINZIDE,ZESTORETIC) 20-25 MG tablet Change in therapy   fluticasone (FLONASE) 50 MCG/ACT nasal spray      Current Outpatient Medications:    acetaminophen (TYLENOL) 650 MG CR tablet, Take 650 mg by mouth every 8 (eight) hours as needed for pain., Disp: , Rfl:    aspirin EC 81 MG tablet, Take 1 tablet (81 mg total) by mouth daily. Swallow whole., Disp: 30 tablet, Rfl: 12   Cholecalciferol (VITAMIN D) 125 MCG (5000 UT) CAPS, Take 5,000 Units by mouth daily., Disp: , Rfl:    gabapentin (NEURONTIN) 600 MG tablet, Take 1 tablet by mouth daily., Disp: , Rfl:    meloxicam (MOBIC) 15 MG tablet, Take 1 tablet (15 mg total) by mouth daily., Disp: 30 tablet, Rfl: 1   olmesartan-hydrochlorothiazide (BENICAR HCT) 40-25 MG tablet, Take 1 tablet by mouth daily., Disp: , Rfl:    pantoprazole (PROTONIX) 40 MG tablet, Take 40 mg  by mouth daily., Disp: , Rfl:    Psyllium (EQ DAILY FIBER PO), Take 5 tablets by mouth daily., Disp: , Rfl:    rosuvastatin (CRESTOR) 20 MG tablet, Take 5 mg by mouth daily., Disp: , Rfl:    traMADol (ULTRAM) 50 MG tablet, , Disp: , Rfl:   Orders Placed This Encounter  Procedures   PCV MYOCARDIAL PERFUSION WITH LEXISCAN   EKG 12-Lead   ECHOCARDIOGRAM COMPLETE    There are no Patient Instructions on file for this visit.   --Continue cardiac medications as reconciled in final medication list. --Return in about 8 weeks (around 12/12/2021) for Follow up, Coronary artery calcification, Review test results, Labs PCP. or sooner if needed. --Continue follow-up with your primary care physician regarding the management of your other chronic comorbid conditions.  Patient's  questions and concerns were addressed to his satisfaction. He voices understanding of the instructions provided during this encounter.   This note was created using a voice recognition software as a result there may be grammatical errors inadvertently enclosed that do not reflect the nature of this encounter. Every attempt is made to correct such errors.  Rex Kras, [place], [facility]  Pager: [phone] Office: [phone]

## 2021-10-24 ENCOUNTER — Telehealth: Payer: Self-pay

## 2021-10-24 DIAGNOSIS — M19042 Primary osteoarthritis, left hand: Secondary | ICD-10-CM | POA: Diagnosis not present

## 2021-10-24 NOTE — Telephone Encounter (Signed)
-----   Message from Waynetta Sandy, MD sent at 10/21/2021  4:05 PM EDT ----- Should be ok.   bc ----- Message ----- From: Gevena Mart, RN Sent: 10/21/2021   2:41 PM EDT To: Waynetta Sandy, MD  This pt called stating that you had instructed him to stop taking ASA 81 mg (I don't see this in the chart anywhere). His cardiologist wants him to restart. Is there any reason he should not? Thanks, Engineer, materials - Triage

## 2021-10-24 NOTE — Telephone Encounter (Signed)
Called pt to give Dr Claretha Cooper reply to inquiry regarding restarting ASA 81 mg, no answer, lf vm.

## 2021-10-25 DIAGNOSIS — H8110 Benign paroxysmal vertigo, unspecified ear: Secondary | ICD-10-CM | POA: Diagnosis not present

## 2021-10-25 DIAGNOSIS — R260 Ataxic gait: Secondary | ICD-10-CM | POA: Diagnosis not present

## 2021-10-26 DIAGNOSIS — M19042 Primary osteoarthritis, left hand: Secondary | ICD-10-CM | POA: Diagnosis not present

## 2021-10-27 DIAGNOSIS — I10 Essential (primary) hypertension: Secondary | ICD-10-CM | POA: Diagnosis not present

## 2021-10-27 DIAGNOSIS — E1122 Type 2 diabetes mellitus with diabetic chronic kidney disease: Secondary | ICD-10-CM | POA: Diagnosis not present

## 2021-10-27 DIAGNOSIS — I2510 Atherosclerotic heart disease of native coronary artery without angina pectoris: Secondary | ICD-10-CM | POA: Diagnosis not present

## 2021-10-27 DIAGNOSIS — E785 Hyperlipidemia, unspecified: Secondary | ICD-10-CM | POA: Diagnosis not present

## 2021-10-27 DIAGNOSIS — R260 Ataxic gait: Secondary | ICD-10-CM | POA: Diagnosis not present

## 2021-10-27 DIAGNOSIS — H8110 Benign paroxysmal vertigo, unspecified ear: Secondary | ICD-10-CM | POA: Diagnosis not present

## 2021-10-31 DIAGNOSIS — M19042 Primary osteoarthritis, left hand: Secondary | ICD-10-CM | POA: Diagnosis not present

## 2021-11-02 DIAGNOSIS — H8110 Benign paroxysmal vertigo, unspecified ear: Secondary | ICD-10-CM | POA: Diagnosis not present

## 2021-11-02 DIAGNOSIS — R260 Ataxic gait: Secondary | ICD-10-CM | POA: Diagnosis not present

## 2021-11-03 DIAGNOSIS — M19042 Primary osteoarthritis, left hand: Secondary | ICD-10-CM | POA: Diagnosis not present

## 2021-11-04 DIAGNOSIS — R260 Ataxic gait: Secondary | ICD-10-CM | POA: Diagnosis not present

## 2021-11-04 DIAGNOSIS — H8110 Benign paroxysmal vertigo, unspecified ear: Secondary | ICD-10-CM | POA: Diagnosis not present

## 2021-11-07 ENCOUNTER — Ambulatory Visit (HOSPITAL_COMMUNITY)
Admission: RE | Admit: 2021-11-07 | Discharge: 2021-11-07 | Disposition: A | Discharge status: Home / Self Care | Payer: Medicare Other | Source: Ambulatory Visit | Attending: Cardiology | Admitting: Cardiology

## 2021-11-07 DIAGNOSIS — I10 Essential (primary) hypertension: Secondary | ICD-10-CM | POA: Insufficient documentation

## 2021-11-07 DIAGNOSIS — E785 Hyperlipidemia, unspecified: Secondary | ICD-10-CM | POA: Insufficient documentation

## 2021-11-07 DIAGNOSIS — I358 Other nonrheumatic aortic valve disorders: Secondary | ICD-10-CM | POA: Insufficient documentation

## 2021-11-07 DIAGNOSIS — I2584 Coronary atherosclerosis due to calcified coronary lesion: Secondary | ICD-10-CM | POA: Diagnosis not present

## 2021-11-07 DIAGNOSIS — I77819 Aortic ectasia, unspecified site: Secondary | ICD-10-CM | POA: Diagnosis not present

## 2021-11-07 DIAGNOSIS — E119 Type 2 diabetes mellitus without complications: Secondary | ICD-10-CM | POA: Diagnosis not present

## 2021-11-07 DIAGNOSIS — I517 Cardiomegaly: Secondary | ICD-10-CM | POA: Insufficient documentation

## 2021-11-07 DIAGNOSIS — I2510 Atherosclerotic heart disease of native coronary artery without angina pectoris: Secondary | ICD-10-CM | POA: Diagnosis not present

## 2021-11-07 DIAGNOSIS — R931 Abnormal findings on diagnostic imaging of heart and coronary circulation: Secondary | ICD-10-CM

## 2021-11-07 DIAGNOSIS — I77810 Thoracic aortic ectasia: Secondary | ICD-10-CM | POA: Diagnosis not present

## 2021-11-08 DIAGNOSIS — I10 Essential (primary) hypertension: Secondary | ICD-10-CM | POA: Diagnosis not present

## 2021-11-08 DIAGNOSIS — M19042 Primary osteoarthritis, left hand: Secondary | ICD-10-CM | POA: Diagnosis not present

## 2021-11-08 DIAGNOSIS — I77810 Thoracic aortic ectasia: Secondary | ICD-10-CM | POA: Diagnosis not present

## 2021-11-08 LAB — ECHOCARDIOGRAM COMPLETE
Area-P 1/2: 3.6 cm2
S' Lateral: 2.6 cm

## 2021-11-09 DIAGNOSIS — H8110 Benign paroxysmal vertigo, unspecified ear: Secondary | ICD-10-CM | POA: Diagnosis not present

## 2021-11-09 DIAGNOSIS — R260 Ataxic gait: Secondary | ICD-10-CM | POA: Diagnosis not present

## 2021-11-10 DIAGNOSIS — I2510 Atherosclerotic heart disease of native coronary artery without angina pectoris: Secondary | ICD-10-CM | POA: Insufficient documentation

## 2021-11-10 DIAGNOSIS — R931 Abnormal findings on diagnostic imaging of heart and coronary circulation: Secondary | ICD-10-CM | POA: Insufficient documentation

## 2021-11-10 DIAGNOSIS — M19042 Primary osteoarthritis, left hand: Secondary | ICD-10-CM | POA: Diagnosis not present

## 2021-11-14 ENCOUNTER — Other Ambulatory Visit: Payer: Medicare Other

## 2021-11-14 ENCOUNTER — Ambulatory Visit: Payer: Medicare Other

## 2021-11-14 DIAGNOSIS — I2510 Atherosclerotic heart disease of native coronary artery without angina pectoris: Secondary | ICD-10-CM | POA: Diagnosis not present

## 2021-11-14 DIAGNOSIS — I2584 Coronary atherosclerosis due to calcified coronary lesion: Secondary | ICD-10-CM | POA: Diagnosis not present

## 2021-11-14 DIAGNOSIS — M19042 Primary osteoarthritis, left hand: Secondary | ICD-10-CM | POA: Diagnosis not present

## 2021-11-14 DIAGNOSIS — R931 Abnormal findings on diagnostic imaging of heart and coronary circulation: Secondary | ICD-10-CM | POA: Diagnosis not present

## 2021-11-15 DIAGNOSIS — R260 Ataxic gait: Secondary | ICD-10-CM | POA: Diagnosis not present

## 2021-11-15 DIAGNOSIS — H8110 Benign paroxysmal vertigo, unspecified ear: Secondary | ICD-10-CM | POA: Diagnosis not present

## 2021-11-17 DIAGNOSIS — M19042 Primary osteoarthritis, left hand: Secondary | ICD-10-CM | POA: Diagnosis not present

## 2021-11-18 DIAGNOSIS — H8110 Benign paroxysmal vertigo, unspecified ear: Secondary | ICD-10-CM | POA: Diagnosis not present

## 2021-11-18 DIAGNOSIS — R260 Ataxic gait: Secondary | ICD-10-CM | POA: Diagnosis not present

## 2021-11-21 DIAGNOSIS — M19042 Primary osteoarthritis, left hand: Secondary | ICD-10-CM | POA: Diagnosis not present

## 2021-11-21 NOTE — Progress Notes (Signed)
Called and left VM to call back for results. Matthew Saras, LPN

## 2021-11-21 NOTE — Progress Notes (Signed)
Patient returned call, was able to give results. Patient acknowledged understanding and had no further questions.

## 2021-11-22 ENCOUNTER — Other Ambulatory Visit: Payer: Self-pay | Admitting: *Deleted

## 2021-11-22 DIAGNOSIS — H8110 Benign paroxysmal vertigo, unspecified ear: Secondary | ICD-10-CM | POA: Diagnosis not present

## 2021-11-22 DIAGNOSIS — R260 Ataxic gait: Secondary | ICD-10-CM | POA: Diagnosis not present

## 2021-11-22 DIAGNOSIS — Z9889 Other specified postprocedural states: Secondary | ICD-10-CM

## 2021-11-23 ENCOUNTER — Other Ambulatory Visit: Payer: Self-pay

## 2021-11-23 ENCOUNTER — Other Ambulatory Visit: Payer: Self-pay | Admitting: *Deleted

## 2021-11-23 ENCOUNTER — Inpatient Hospital Stay: Payer: Medicare Other | Attending: Hematology and Oncology

## 2021-11-23 DIAGNOSIS — Z87891 Personal history of nicotine dependence: Secondary | ICD-10-CM | POA: Diagnosis not present

## 2021-11-23 DIAGNOSIS — D500 Iron deficiency anemia secondary to blood loss (chronic): Secondary | ICD-10-CM

## 2021-11-23 DIAGNOSIS — Z79899 Other long term (current) drug therapy: Secondary | ICD-10-CM | POA: Diagnosis not present

## 2021-11-23 DIAGNOSIS — R42 Dizziness and giddiness: Secondary | ICD-10-CM | POA: Diagnosis not present

## 2021-11-23 DIAGNOSIS — D472 Monoclonal gammopathy: Secondary | ICD-10-CM | POA: Diagnosis not present

## 2021-11-23 DIAGNOSIS — D649 Anemia, unspecified: Secondary | ICD-10-CM | POA: Diagnosis not present

## 2021-11-23 DIAGNOSIS — Z7982 Long term (current) use of aspirin: Secondary | ICD-10-CM | POA: Insufficient documentation

## 2021-11-23 LAB — CBC WITH DIFFERENTIAL (CANCER CENTER ONLY)
Abs Immature Granulocytes: 0.01 10*3/uL (ref 0.00–0.07)
Basophils Absolute: 0 10*3/uL (ref 0.0–0.1)
Basophils Relative: 1 %
Eosinophils Absolute: 0.1 10*3/uL (ref 0.0–0.5)
Eosinophils Relative: 3 %
HCT: 30.6 % — ABNORMAL LOW (ref 39.0–52.0)
Hemoglobin: 10.4 g/dL — ABNORMAL LOW (ref 13.0–17.0)
Immature Granulocytes: 0 %
Lymphocytes Relative: 20 %
Lymphs Abs: 0.6 10*3/uL — ABNORMAL LOW (ref 0.7–4.0)
MCH: 31.8 pg (ref 26.0–34.0)
MCHC: 34 g/dL (ref 30.0–36.0)
MCV: 93.6 fL (ref 80.0–100.0)
Monocytes Absolute: 0.3 10*3/uL (ref 0.1–1.0)
Monocytes Relative: 8 %
Neutro Abs: 2.1 10*3/uL (ref 1.7–7.7)
Neutrophils Relative %: 68 %
Platelet Count: 135 10*3/uL — ABNORMAL LOW (ref 150–400)
RBC: 3.27 MIL/uL — ABNORMAL LOW (ref 4.22–5.81)
RDW: 14 % (ref 11.5–15.5)
WBC Count: 3.1 10*3/uL — ABNORMAL LOW (ref 4.0–10.5)
nRBC: 0 % (ref 0.0–0.2)

## 2021-11-23 LAB — CMP (CANCER CENTER ONLY)
ALT: 21 U/L (ref 0–44)
AST: 32 U/L (ref 15–41)
Albumin: 4.3 g/dL (ref 3.5–5.0)
Alkaline Phosphatase: 92 U/L (ref 38–126)
Anion gap: 5 (ref 5–15)
BUN: 27 mg/dL — ABNORMAL HIGH (ref 8–23)
CO2: 27 mmol/L (ref 22–32)
Calcium: 9.8 mg/dL (ref 8.9–10.3)
Chloride: 105 mmol/L (ref 98–111)
Creatinine: 1.15 mg/dL (ref 0.61–1.24)
GFR, Estimated: >60 mL/min (ref >60)
Glucose, Bld: 132 mg/dL — ABNORMAL HIGH (ref 70–99)
Potassium: 4.3 mmol/L (ref 3.5–5.1)
Sodium: 137 mmol/L (ref 135–145)
Total Bilirubin: 0.6 mg/dL (ref 0.3–1.2)
Total Protein: 9.2 g/dL — ABNORMAL HIGH (ref 6.5–8.1)

## 2021-11-23 LAB — IRON AND IRON BINDING CAPACITY (CC-WL,HP ONLY)
Iron: 105 ug/dL (ref 45–182)
Saturation Ratios: 28 % (ref 17.9–39.5)
TIBC: 370 ug/dL (ref 250–450)
UIBC: 265 ug/dL (ref 117–376)

## 2021-11-23 LAB — FERRITIN: Ferritin: 241 ng/mL (ref 24–336)

## 2021-11-23 LAB — LACTATE DEHYDROGENASE: LDH: 157 U/L (ref 98–192)

## 2021-11-24 DIAGNOSIS — M19042 Primary osteoarthritis, left hand: Secondary | ICD-10-CM | POA: Diagnosis not present

## 2021-11-24 LAB — KAPPA/LAMBDA LIGHT CHAINS
Kappa free light chain: 93.4 mg/L — ABNORMAL HIGH (ref 3.3–19.4)
Kappa, lambda light chain ratio: 8.73 — ABNORMAL HIGH (ref 0.26–1.65)
Lambda free light chains: 10.7 mg/L (ref 5.7–26.3)

## 2021-11-25 DIAGNOSIS — H8110 Benign paroxysmal vertigo, unspecified ear: Secondary | ICD-10-CM | POA: Diagnosis not present

## 2021-11-25 DIAGNOSIS — R260 Ataxic gait: Secondary | ICD-10-CM | POA: Diagnosis not present

## 2021-11-28 DIAGNOSIS — M19042 Primary osteoarthritis, left hand: Secondary | ICD-10-CM | POA: Diagnosis not present

## 2021-11-28 LAB — MULTIPLE MYELOMA PANEL, SERUM
Albumin SerPl Elph-Mcnc: 4 g/dL (ref 2.9–4.4)
Albumin/Glob SerPl: 0.9 (ref 0.7–1.7)
Alpha 1: 0.3 g/dL (ref 0.0–0.4)
Alpha2 Glob SerPl Elph-Mcnc: 0.8 g/dL (ref 0.4–1.0)
B-Globulin SerPl Elph-Mcnc: 0.9 g/dL (ref 0.7–1.3)
Gamma Glob SerPl Elph-Mcnc: 2.8 g/dL — ABNORMAL HIGH (ref 0.4–1.8)
Globulin, Total: 4.8 g/dL — ABNORMAL HIGH (ref 2.2–3.9)
IgA: 15 mg/dL — ABNORMAL LOW (ref 61–437)
IgG (Immunoglobin G), Serum: 3149 mg/dL — ABNORMAL HIGH (ref 603–1613)
IgM (Immunoglobulin M), Srm: 32 mg/dL (ref 15–143)
M Protein SerPl Elph-Mcnc: 2.6 g/dL — ABNORMAL HIGH
Total Protein ELP: 8.8 g/dL — ABNORMAL HIGH (ref 6.0–8.5)

## 2021-11-29 DIAGNOSIS — H8110 Benign paroxysmal vertigo, unspecified ear: Secondary | ICD-10-CM | POA: Diagnosis not present

## 2021-11-29 DIAGNOSIS — R260 Ataxic gait: Secondary | ICD-10-CM | POA: Diagnosis not present

## 2021-11-29 NOTE — Progress Notes (Unsigned)
[place] Telephone:(336) [phone]   Fax:(336) [phone]  PROGRESS NOTE  Patient Care Team: Deland Pretty, MD as PCP - General (Internal Medicine) Frederik Pear, MD as Consulting Physician (Orthopedic Surgery) Phylliss Bob, MD as Consulting Physician (Orthopedic Surgery) Magrinat, Virgie Dad, MD (Inactive) as Consulting Physician (Hematology and Oncology) Ashok Pall, MD as Consulting Physician (Neurosurgery) Myrlene Broker, MD as Attending Physician (Urology)  Hematological/Oncological History # IgG Kappa Monoclonal Gammopathy of Undetermined Significance 04/02/2018: underwent bone marrow biopsy, showed 9% plasma cells. 10/21/2020: last visit with Dr. Jana Hakim 05/25/2021: Labs showed M protein 2.7, kappa 70, lambda 11.3, and ratio 6.19.  White blood cell count 3.7, hemoglobin 11.4, MCV 90.5, and platelets 137 06/01/2021: establish care with Dr. Lorenso Courier   Interval History:  Walter White. 71 y.o. male with medical history significant for IgG kappa monoclonal gammopathy of undetermined significance who presents for a follow up visit. The patient's last visit was on 06/01/2021. In the interim since the last visit he has had no major changes in his health.  On exam today Walter White reports he is looking forward to Thanksgiving this year.  He will be going over to his twin granddaughters house.  He reports that he currently has his left hand in a brace and has had this since March 2023.  He reports that he Inocor placement and then subsequently heard a clicking noise.  It was later found that the joint came apart.  He had the surgery redone in August 2023 and he is looking for the brace coming off in December of this year.  He notes that his energy levels are good and his appetite is strong.  He has had no major changes in his health this year.  He does he does have some occasional bouts of feeling dizzy when he bends over.  He was found to have a low blood pressure 1.111/58.   He notes that he also has some GI upset with him taking Metamucil as prescribed by his gastroenterologist and metformin.  He notes that he does occasionally have difficulty passing his stools.  He is currently taking tramadol 3 times daily with Tylenol.  He notes that otherwise he has been suffering from some poor sleep.  He otherwise denies any fevers, chills, sweats, nausea, vomiting or diarrhea.  A full 10 point ROS is listed below.  MEDICAL HISTORY:  Past Medical History:  Diagnosis Date   AAA (abdominal aortic aneurysm) (HCC)    4.0 cm AAA 08/22/17 U/S   Allergy    Allergic Rhinitis   Anemia    Arthritis    spinal steniosis   Benign prostate hyperplasia    with lower urinary tract symptoms   Carpal tunnel syndrome on right    Cataracts, both eyes    Chronic back pain    caused by spinal stenosis   Elevated PSA    GERD (gastroesophageal reflux disease)    Hyperlipidemia    Hypertension    Essential.     MGUS (monoclonal gammopathy of unknown significance)    Microhematuria    OA (osteoarthritis) of knee    Pneumonia    Hx -when very young   Pre-diabetes    Seasonal allergies    Spinal stenosis    of lumbosacral region.  Causes chronic back pain.   Upper abdominal pain     SURGICAL HISTORY: Past Surgical History:  Procedure Laterality Date   ABDOMINAL EXPOSURE N/A 09/05/2017   Procedure: ABDOMINAL EXPOSURE;  Surgeon:  Rosetta Posner, MD;  Location: [facility] OR;  Service: Vascular;  Laterality: N/A;   ANTERIOR LAT LUMBAR FUSION Left 01/26/2016   Procedure: LEFT SIDED LATERAL INTERBODY FUSION, LUMBAR 3-4 WITH INSTRUMENTATION AND ALLOGRAFT;  Surgeon: Phylliss Bob, MD;  Location: [place];  Service: Orthopedics;  Laterality: Left;  LEFT SIDED LATERAL INTERBODY FUSION, LUMBAR 3-4 WITH INSTRUMENTATION AND ALLOGRAFT   ANTERIOR LAT LUMBAR FUSION Right 08/14/2018   Procedure: RIGHT LUMBAR two to LUMBAR three LATERAL INTERBODY FUSION WITH INSTRUMENTATION AND ALLOGRAFT;  Surgeon: Phylliss Bob,  MD;  Location: [place];  Service: Orthopedics;  Laterality: Right;   ANTERIOR LATERAL LUMBAR FUSION WITH PERCUTANEOUS SCREW 1 LEVEL N/A 09/05/2017   Procedure: LUMBAR 5 - SACRUM 1 ANTERIOR LUMBAR INTERBODY FUSION WITH INSTRUMENTATION AND ALLOGRAFT;  Surgeon: Phylliss Bob, MD;  Location: [place];  Service: Orthopedics;  Laterality: N/A;   BACK SURGERY  01/2015   dr. Lynann Bologna. [id number].   CARPAL TUNNEL RELEASE     right    CATARACT EXTRACTION, BILATERAL     COLONOSCOPY     COLONOSCOPY  2010,2016   polyp   ELBOW SURGERY     right ulnar release   ELBOW SURGERY     LEFT CTR   EYE SURGERY     FINGER ARTHROPLASTY Left 04/04/2021   Procedure: LEFT INDEX FINGER IMPLANT ARTHROPLASTY AT METACARPOPHALANGEAL JOINT;  Surgeon: Milly Jakob, MD;  Location: [place];  Service: Orthopedics;  Laterality: Left;  PRE-OP BLOCK,   FINGER ARTHROPLASTY Left 09/16/2021   Procedure: LEFT INDEX FINGER METACARPOPHALANGEAL JOINT REVISION ARTHROPLASTY;  Surgeon: Milly Jakob, MD;  Location: [place];  Service: Orthopedics;  Laterality: Left;   JOINT REPLACEMENT     left- Sept 2016   KNEE ARTHROSCOPY  11/23/2010   Procedure: ARTHROSCOPY KNEE;  Surgeon: Kerin Salen;  Location: [place];  Service: Orthopedics;  Laterality: Left;  arthroscopy left knee   KNEE ARTHROSCOPY     left x 2   Knee scar tissue removal Left 2018   LATERAL FUSION LUMBAR SPINE  01./17/2018   polypectomy  2010   SHOULDER ARTHROSCOPY Left    x2   TONSILLECTOMY     TOTAL KNEE ARTHROPLASTY Left 09/21/2014   Procedure: TOTAL KNEE ARTHROPLASTY;  Surgeon: Frederik Pear, MD;  Location: [place];  Service: Orthopedics;  Laterality: Left;   ULNAR NERVE TRANSPOSITION Left 04/16/2017   Procedure: LEFT ULNAR NEUROPLASTY AT THE WRIST AND TRANSPOSITION AT THE ELBOW;  Surgeon: Milly Jakob, MD;  Location: [place];  Service: Orthopedics;  Laterality: Left;   WISDOM TOOTH EXTRACTION       SOCIAL HISTORY: Social History   Socioeconomic History   Marital status: Married    Spouse name: [name]    Number of children: 2   Years of education: Some colle   Highest education level: Not on file  Occupational History   Occupation: Retired  Tobacco Use   Smoking status: Former    Packs/day: 0.50    Years: 10.00    Total pack years: 5.00    Types: Cigarettes    Quit date: 11/21/1980    Years since quitting: 41.0   Smokeless tobacco: Never  Vaping Use   Vaping Use: Never used  Substance and Sexual Activity   Alcohol use: Yes    Alcohol/week: 0.0 standard drinks of alcohol    Comment: Very rare   Drug use: No   Sexual activity: Not on file  Other Topics Concern  Not on file  Social History Narrative   Lives with wife   Caffeine use: tea every day   Social Determinants of Health   Financial Resource Strain: Not on file  Food Insecurity: Not on file  Transportation Needs: Not on file  Physical Activity: Not on file  Stress: Not on file  Social Connections: Not on file  Intimate Partner Violence: Not on file    FAMILY HISTORY: Family History  Problem Relation Age of Onset   Cancer Mother    Liver disease Father    Colon cancer Maternal Uncle 76   Colon polyps Neg Hx    Esophageal cancer Neg Hx    Rectal cancer Neg Hx    Stomach cancer Neg Hx     ALLERGIES:  is allergic to pork-derived products, shellfish allergy, lovastatin, and penicillins.  MEDICATIONS:  Current Outpatient Medications  Medication Sig Dispense Refill   acetaminophen (TYLENOL) 650 MG CR tablet Take 650 mg by mouth every 8 (eight) hours as needed for pain.     aspirin EC 81 MG tablet Take 1 tablet (81 mg total) by mouth daily. Swallow whole. 30 tablet 12   Cholecalciferol (VITAMIN D) 125 MCG (5000 UT) CAPS Take 5,000 Units by mouth daily.     gabapentin (NEURONTIN) 600 MG tablet Take 1 tablet by mouth daily.     meloxicam (MOBIC) 15 MG tablet Take 1 tablet (15 mg total) by mouth  daily. 30 tablet 1   olmesartan-hydrochlorothiazide (BENICAR HCT) 40-25 MG tablet Take 1 tablet by mouth daily.     pantoprazole (PROTONIX) 40 MG tablet Take 40 mg by mouth daily.     Psyllium (EQ DAILY FIBER PO) Take 5 tablets by mouth daily.     rosuvastatin (CRESTOR) 20 MG tablet Take 5 mg by mouth daily.     traMADol (ULTRAM) 50 MG tablet      No current facility-administered medications for this visit.    REVIEW OF SYSTEMS:   Constitutional: ( - ) fevers, ( - )  chills , ( - ) night sweats Eyes: ( - ) blurriness of vision, ( - ) double vision, ( - ) watery eyes Ears, nose, mouth, throat, and face: ( - ) mucositis, ( - ) sore throat Respiratory: ( - ) cough, ( - ) dyspnea, ( - ) wheezes Cardiovascular: ( - ) palpitation, ( - ) chest discomfort, ( - ) lower extremity swelling Gastrointestinal:  ( - ) nausea, ( - ) heartburn, ( - ) change in bowel habits Skin: ( - ) abnormal skin rashes Lymphatics: ( - ) new lymphadenopathy, ( - ) easy bruising Neurological: ( - ) numbness, ( - ) tingling, ( - ) new weaknesses Behavioral/Psych: ( - ) mood change, ( - ) new changes  All other systems were reviewed with the patient and are negative.  PHYSICAL EXAMINATION: ECOG PERFORMANCE STATUS: 1 - Symptomatic but completely ambulatory  Vitals:   11/30/21 0853  BP: (!) 160/84  Pulse: (!) 105  Temp: 97.8 F (36.6 C)  SpO2: 99%    Filed Weights   11/30/21 0853  Weight: 237 lb 3.2 oz (107.6 kg)     GENERAL: Well-appearing elderly Caucasian male, alert, no distress and comfortable SKIN: skin color, texture, turgor are normal, no rashes or significant lesions EYES: conjunctiva are pink and non-injected, sclera clear LUNGS: clear to auscultation and percussion with normal breathing effort HEART: regular rate & rhythm and no murmurs and no lower extremity edema Musculoskeletal: no cyanosis  of digits and no clubbing  PSYCH: alert & oriented x 3, fluent speech NEURO: no focal motor/sensory  deficits  LABORATORY DATA:  I have reviewed the data as listed    Latest Ref Rng & Units 11/23/2021    8:45 AM 06/01/2021    9:35 AM 05/25/2021    9:08 AM  CBC  WBC 4.0 - 10.5 K/uL 3.1  2.9  3.7   Hemoglobin 13.0 - 17.0 g/dL 10.4  10.9  11.4   Hematocrit 39.0 - 52.0 % 30.6  32.2  33.4   Platelets 150 - 400 K/uL 135  131  137        Latest Ref Rng & Units 11/23/2021    8:45 AM 09/14/2021   12:38 PM 05/25/2021    9:08 AM  CMP  Glucose 70 - 99 mg/dL 132  162  135   BUN 8 - 23 mg/dL _0 Creatinine 0.61 - 1.24 mg/dL 1.15  1.10  1.01   Sodium 135 - 145 mmol/L 137  134  135   Potassium 3.5 - 5.1 mmol/L 4.3  4.3  4.6   Chloride 98 - 111 mmol/L 105  103  103   CO2 22 - 32 mmol/L _1 Calcium 8.9 - 10.3 mg/dL 9.8  9.5  9.8   Total Protein 6.5 - 8.1 g/dL 9.2   9.0   Total Bilirubin 0.3 - 1.2 mg/dL 0.6   0.6   Alkaline Phos 38 - 126 U/L 92   91   AST 15 - 41 U/L 32   24   ALT 0 - 44 U/L 21   19     Lab Results  Component Value Date   MPROTEIN 2.6 (H) 11/23/2021   MPROTEIN 2.7 (H) 05/25/2021   MPROTEIN 2.5 (H) 10/14/2020   Lab Results  Component Value Date   KPAFRELGTCHN 93.4 (H) 11/23/2021   KPAFRELGTCHN 70.0 (H) 05/25/2021   KPAFRELGTCHN 80.9 (H) 10/14/2020   LAMBDASER 10.7 11/23/2021   LAMBDASER 11.3 05/25/2021   LAMBDASER 10.7 10/14/2020   KAPLAMBRATIO 8.73 (H) 11/23/2021   KAPLAMBRATIO 6.19 (H) 05/25/2021   KAPLAMBRATIO 7.56 (H) 10/14/2020   RADIOGRAPHIC STUDIES: PCV MYOCARDIAL PERFUSION WITH LEXISCAN  Result Date: 11/15/2021 Lexiscan  Nuclear stress test 11/14/2021: Prominent diaphragmatic attenuation noted inferior wall. Myocardial perfusion does not reveal ischemia or scar. Overall LV systolic function is normal without regional wall motion abnormalities. Calculated Stress LV EF: 49%, visually appears normal. Correlate with echocardiogram. Non-diagnostic ECG stress. The heart rate response was consistent with Regadenoson. No previous exam available for  comparison. Low risk.   ECHOCARDIOGRAM COMPLETE  Result Date: 11/08/2021    ECHOCARDIOGRAM REPORT   Patient Name:   Walter White. Date of Exam: 11/07/2021 Medical Rec #:  [id number]           Height:       68.0 in Accession #:    [id number]          Weight:       240.6 lb Date of Birth:  1950/10/22           BSA:          2.211 m Patient Age:    21 years            BP:           151/94 mmHg Patient Gender: M  HR:           112 bpm. Exam Location:  Outpatient Procedure: 2D Echo, Color Doppler and Cardiac Doppler Indications:     CAD Native Vessel i25.10  History:         Patient has no prior history of Echocardiogram examinations.                  Risk Factors:Hypertension, Diabetes and Dyslipidemia.  Sonographer:     Raquel Sarna Senior RDCS Referring Phys:  [id number] Rex Kras Diagnosing Phys: Rex Kras DO IMPRESSIONS  1. Left ventricular ejection fraction, by estimation, is 55 to 60%. The left ventricle has normal function. The left ventricle has no regional wall motion abnormalities. There is moderate left ventricular hypertrophy. Left ventricular diastolic parameters were normal.  2. Right ventricular systolic function is normal. The right ventricular size is normal.  3. The mitral valve is degenerative. No evidence of mitral valve regurgitation. No evidence of mitral stenosis.  4. The aortic valve is tricuspid. Aortic valve regurgitation is not visualized. Aortic valve sclerosis is present, with no evidence of aortic valve stenosis.  5. Aortic dilatation noted. There is mild dilatation of the aortic root, measuring 39 mm. There is mild dilatation of the ascending aorta, measuring 41 mm. Comparison(s): No prior Echocardiogram. FINDINGS  Left Ventricle: Left ventricular ejection fraction, by estimation, is 55 to 60%. The left ventricle has normal function. The left ventricle has no regional wall motion abnormalities. The left ventricular internal cavity size was normal in size. There is   moderate left ventricular hypertrophy. Left ventricular diastolic parameters were normal. Right Ventricle: The right ventricular size is normal. No increase in right ventricular wall thickness. Right ventricular systolic function is normal. Left Atrium: Left atrial size was normal in size. Right Atrium: Right atrial size was normal in size. Pericardium: There is no evidence of pericardial effusion. Mitral Valve: The mitral valve is degenerative in appearance. There is mild thickening of the mitral valve leaflet(s). Normal mobility of the mitral valve leaflets. No evidence of mitral valve regurgitation. No evidence of mitral valve stenosis. Tricuspid Valve: The tricuspid valve is grossly normal. Tricuspid valve regurgitation is trivial. No evidence of tricuspid stenosis. Aortic Valve: The aortic valve is tricuspid. Aortic valve regurgitation is not visualized. Aortic valve sclerosis is present, with no evidence of aortic valve stenosis. Pulmonic Valve: The pulmonic valve was grossly normal. Pulmonic valve regurgitation is not visualized. No evidence of pulmonic stenosis. Aorta: Aortic dilatation noted. There is mild dilatation of the aortic root, measuring 39 mm. There is mild dilatation of the ascending aorta, measuring 41 mm. IAS/Shunts: The interatrial septum was not well visualized.  LEFT VENTRICLE PLAX 2D LVIDd:         3.50 cm LVIDs:         2.60 cm LV PW:         1.50 cm LV IVS:        1.50 cm LVOT diam:     2.20 cm LV SV:         60 LV SV Index:   27 LVOT Area:     3.80 cm  RIGHT VENTRICLE RV S prime:     15.30 cm/s TAPSE (M-mode): 2.5 cm LEFT ATRIUM             Index        RIGHT ATRIUM           Index LA diam:        3.70 cm  1.67 cm/m   RA Area:     24.40 cm LA Vol (A2C):   67.1 ml 30.35 ml/m  RA Volume:   77.60 ml  35.10 ml/m LA Vol (A4C):   60.2 ml 27.23 ml/m LA Biplane Vol: 67.5 ml 30.53 ml/m  AORTIC VALVE LVOT Vmax:   82.40 cm/s LVOT Vmean:  62.000 cm/s LVOT VTI:    0.157 m  AORTA Ao Root diam:  3.90 cm Ao Asc diam:  4.10 cm MITRAL VALVE MV Area (PHT): 3.60 cm    SHUNTS MV Decel Time: 211 msec    Systemic VTI:  0.16 m MV E velocity: 62.20 cm/s  Systemic Diam: 2.20 cm MV A velocity: 87.40 cm/s MV E/A ratio:  0.71 Sunit Tolia DO Electronically signed by Rex Kras DO Signature Date/Time: 11/08/2021/12:03:41 AM    Final     ASSESSMENT & PLAN Walter White. 71 y.o. male with medical history significant for IgG kappa monoclonal gammopathy of undetermined significance who presents for a follow up visit.   Monoclonal Gammopathies are a group of medical conditions defined by the presence of a monoclonal protein (an M protein) in the blood or urine. Monoclonal gammopathies include monoclonal gammopathy of unknown significance (MGUS), Monoclonal gammopathies of renal or neurological significance,  smoldering multiple myeloma (SMM), multiple myeloma (MM), AL amyloidosis, and Waldenstrom macroglobulinemia. The goal of the initial workup is to determine which monoclonal gammopathy a patient has. The workup consists of evaluating protein in the serum (with serum protein electrophoresis (SPEP) and serum free light chains) , evaluating protein in the urine (UPEP), and evaluation of the skeleton (DG Bone Met Survey) to assure no lytic lesions. Baseline bloodwork includes CMP and CBC. If no CRAB criteria or high risk criteria are noted then the diagnosis is MGUS. MGUS must be followed with bloodwork periodically to assure it does not convert to multiple myeloma (occurs to approximately 1% of patients per year). If there are CRAB criteria or high risk features (such as elevated serum free light chain ratio (taking into account renal function), a non IgG M protein, or M protein >1.5) then a bone marrow biopsy must be pursued.    #IgG Kappa Monoclonal Gammopathy of Undetermined Significance -- Prior bone marrow biopsy performed on 04/02/2018 which confirmed diagnosis of MGUS with 9% plasma cells --Steady increase  in M protein since time of diagnosis with mild increase in serum free light chains --Patient has mild anemia which is also been stable with normal kidney function. Plan: --at each visit will order an SPEP, UPEP, SFLC and beta 2 microglobulin --additionally will collect new baseline CBC, CMP, and LDH --recommend a metastatic bone survey to assess for lytic lesions on a yearly basis.  Next scan is currently due.  --will consider the need for a repeat bone marrow biopsy pending the above results --Labs showed M protein 2.6, kappa 93.4, lambda 10.7, and ratio 8.73.  White blood cell count 3.1, hemoglobin 10.4, MCV 93.6, and platelets 135 --RTC in 6 months time.    Orders Placed This Encounter  Procedures   DG Bone Survey Met    Standing Status:   Future    Standing Expiration Date:   12/01/2022    Order Specific Question:   Reason for Exam (SYMPTOM  OR DIAGNOSIS REQUIRED)    Answer:   MGUS assess for lytics    Order Specific Question:   Preferred imaging location?    Answer:   [facility]    All questions were  answered. The patient knows to call the clinic with any problems, questions or concerns.  A total of more than 30 minutes were spent on this encounter with face-to-face time and non-face-to-face time, including preparing to see the patient, ordering tests and/or medications, counseling the patient and coordination of care as outlined above.   Ledell Peoples, MD Department of Hematology/Oncology [place] at [facility] Phone: [phone] Pager: [phone] Email: [email or web address]  11/30/2021 9:27 AM

## 2021-11-30 ENCOUNTER — Inpatient Hospital Stay (HOSPITAL_BASED_OUTPATIENT_CLINIC_OR_DEPARTMENT_OTHER): Payer: Medicare Other | Admitting: Hematology and Oncology

## 2021-11-30 VITALS — BP 160/84 | HR 105 | Temp 97.8°F | Ht 68.0 in | Wt 237.2 lb

## 2021-11-30 DIAGNOSIS — Z87891 Personal history of nicotine dependence: Secondary | ICD-10-CM | POA: Diagnosis not present

## 2021-11-30 DIAGNOSIS — D472 Monoclonal gammopathy: Secondary | ICD-10-CM | POA: Diagnosis not present

## 2021-11-30 DIAGNOSIS — D500 Iron deficiency anemia secondary to blood loss (chronic): Secondary | ICD-10-CM | POA: Diagnosis not present

## 2021-11-30 DIAGNOSIS — R42 Dizziness and giddiness: Secondary | ICD-10-CM | POA: Diagnosis not present

## 2021-11-30 DIAGNOSIS — Z79899 Other long term (current) drug therapy: Secondary | ICD-10-CM | POA: Diagnosis not present

## 2021-11-30 DIAGNOSIS — Z7982 Long term (current) use of aspirin: Secondary | ICD-10-CM | POA: Diagnosis not present

## 2021-11-30 DIAGNOSIS — D649 Anemia, unspecified: Secondary | ICD-10-CM | POA: Diagnosis not present

## 2021-12-05 DIAGNOSIS — M19042 Primary osteoarthritis, left hand: Secondary | ICD-10-CM | POA: Diagnosis not present

## 2021-12-06 DIAGNOSIS — M4316 Spondylolisthesis, lumbar region: Secondary | ICD-10-CM | POA: Diagnosis not present

## 2021-12-06 DIAGNOSIS — R260 Ataxic gait: Secondary | ICD-10-CM | POA: Diagnosis not present

## 2021-12-06 DIAGNOSIS — H8110 Benign paroxysmal vertigo, unspecified ear: Secondary | ICD-10-CM | POA: Diagnosis not present

## 2021-12-07 ENCOUNTER — Ambulatory Visit (INDEPENDENT_AMBULATORY_CARE_PROVIDER_SITE_OTHER): Payer: Medicare Other | Admitting: Physician Assistant

## 2021-12-07 ENCOUNTER — Ambulatory Visit (HOSPITAL_COMMUNITY)
Admission: RE | Admit: 2021-12-07 | Discharge: 2021-12-07 | Disposition: A | Discharge status: Home / Self Care | Payer: Medicare Other | Source: Ambulatory Visit | Attending: Vascular Surgery | Admitting: Vascular Surgery

## 2021-12-07 ENCOUNTER — Other Ambulatory Visit: Payer: Self-pay | Admitting: Vascular Surgery

## 2021-12-07 VITALS — BP 144/83 | HR 68 | Temp 97.9°F | Ht 68.0 in | Wt 241.0 lb

## 2021-12-07 DIAGNOSIS — Z9889 Other specified postprocedural states: Secondary | ICD-10-CM | POA: Insufficient documentation

## 2021-12-07 DIAGNOSIS — I714 Abdominal aortic aneurysm, without rupture, unspecified: Secondary | ICD-10-CM

## 2021-12-07 DIAGNOSIS — M19042 Primary osteoarthritis, left hand: Secondary | ICD-10-CM | POA: Diagnosis not present

## 2021-12-07 NOTE — Progress Notes (Signed)
Established Abdominal Aortic Aneurysm   History of Present Illness   Walter Riffe. is a 71 y.o. (1950-10-17) male who presents for surveillance of AAA.  He has a history of AAA that was last measured 4.0 cm at its maximal diameter in the proximal abdominal aorta on 11/2020.    At today's visit, he denies any new changing back or abdominal pain.  He does have a long history of chronic lower back pain.  He has a history of 5 back surgeries.   He denies any claudication, rest pain, wounds of the lower extremities.  He states recently he had a cardiac workup for severe coronary artery calcification.  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  He was started on aspirin 81 mg daily due to high risk for MI or CVA.   Current Outpatient Medications  Medication Sig Dispense Refill   acetaminophen (TYLENOL) 650 MG CR tablet Take 650 mg by mouth every 8 (eight) hours as needed for pain.     aspirin EC 81 MG tablet Take 1 tablet (81 mg total) by mouth daily. Swallow whole. 30 tablet 12   Cholecalciferol (VITAMIN D) 125 MCG (5000 UT) CAPS Take 5,000 Units by mouth daily.     gabapentin (NEURONTIN) 600 MG tablet Take 1 tablet by mouth daily.     meloxicam (MOBIC) 15 MG tablet Take 1 tablet (15 mg total) by mouth daily. 30 tablet 1   olmesartan-hydrochlorothiazide (BENICAR HCT) 40-25 MG tablet Take 1 tablet by mouth daily.     pantoprazole (PROTONIX) 40 MG tablet Take 40 mg by mouth daily.     Psyllium (EQ DAILY FIBER PO) Take 5 tablets by mouth daily.     rosuvastatin (CRESTOR) 20 MG tablet Take 5 mg by mouth daily.     traMADol (ULTRAM) 50 MG tablet      No current facility-administered medications for this visit.    REVIEW OF SYSTEMS (negative unless checked):   Cardiac:  '[]'$  Chest pain or chest pressure? '[]'$  Shortness of breath upon activity? '[]'$  Shortness of breath when lying flat? '[]'$  Irregular heart rhythm?  Vascular:  '[]'$   Pain in calf, thigh, or hip brought on by walking? '[]'$  Pain in feet at night that wakes you up from your sleep? '[]'$  Blood clot in your veins? '[]'$  Leg swelling?  Pulmonary:  '[]'$  Oxygen at home? '[]'$  Productive cough? '[]'$  Wheezing?  Neurologic:  '[]'$  Sudden weakness in arms or legs? '[]'$  Sudden numbness in arms or legs? '[]'$  Sudden onset of difficult speaking or slurred speech? '[]'$  Temporary loss of vision in one eye? '[]'$  Problems with dizziness?  Gastrointestinal:  '[]'$  Blood in stool? '[]'$  Vomited blood?  Genitourinary:  '[]'$  Burning when urinating? '[]'$  Blood in urine?  Psychiatric:  '[]'$  Major depression  Hematologic:  '[]'$  Bleeding problems? '[]'$  Problems with blood clotting?  Dermatologic:  '[]'$  Rashes or ulcers?  Constitutional:  '[]'$  Fever or chills?  Ear/Nose/Throat:  '[]'$  Change in hearing? '[]'$  Nose bleeds? '[]'$  Sore throat?  Musculoskeletal:  '[x]'$  Back pain? '[]'$  Joint pain? '[]'$  Muscle pain?   Physical Examination   Vitals:   12/07/21 0934  BP: (!) 144/83  Pulse: 68  Temp: 97.9 F (36.6 C)  TempSrc: Temporal  SpO2: 99%  Weight: 241 lb (109.3 kg)  Height: '5\' 8"'$  (1.727 m)   Body mass index is 36.64 kg/m.  General:  WDWN in NAD; vital signs documented above Gait: Not  observed HENT: WNL, normocephalic Pulmonary: normal non-labored breathing , without rales, rhonchi,  wheezing Cardiac: RRR with no murmurs or carotid bruits Abdomen: soft, NT, no masses Skin: without rashes Vascular Exam/Pulses: 2+ DP pulses bilaterally Extremities: Without ischemic changes or wounds Musculoskeletal: no muscle wasting or atrophy  Neurologic: A&O X 3;  No focal weakness or paresthesias are detected Psychiatric:  The pt has Normal affect.   Non-Invasive Vascular Imaging   AAA Duplex (12/07/2021) Current size: 3.11 cm Previous size: 4.0 cm (12/01/2020)   Medical Decision Making   Walter Harvey. is a 71 y.o. (Jan 26, 1950) male who presents with: surveillance of asymptomatic AAA  Based  on this patient's duplex study, his AAA is currently stable with a measurement today of 3.11 cm at its maximum diameter in the proximal aorta.  Most recent measurement in 2022 of 4.0 cm The threshold for repair is AAA size > 5.5 cm, growth > 1 cm/yr, and symptomatic status. He has baseline chronic lower back pain with a history of 5 surgeries.  He has no new or changing abdominal or back pain He has recently been started on aspirin 81 mg by his cardiologist due to severe coronary artery calcification.  I explained to the patient that we are fine with him taking aspirin as well He can follow-up with [place] in 1 year with repeat AAA duplex   Walter Serene PA-C [facility] Office: [facility] MD: Donzetta Matters

## 2021-12-07 NOTE — Telephone Encounter (Signed)
From patient.

## 2021-12-08 DIAGNOSIS — H8110 Benign paroxysmal vertigo, unspecified ear: Secondary | ICD-10-CM | POA: Diagnosis not present

## 2021-12-08 DIAGNOSIS — R260 Ataxic gait: Secondary | ICD-10-CM | POA: Diagnosis not present

## 2021-12-12 DIAGNOSIS — M19042 Primary osteoarthritis, left hand: Secondary | ICD-10-CM | POA: Diagnosis not present

## 2021-12-16 DIAGNOSIS — M19042 Primary osteoarthritis, left hand: Secondary | ICD-10-CM | POA: Diagnosis not present

## 2021-12-20 DIAGNOSIS — M19042 Primary osteoarthritis, left hand: Secondary | ICD-10-CM | POA: Diagnosis not present

## 2021-12-21 ENCOUNTER — Ambulatory Visit: Payer: Medicare Other | Admitting: Cardiology

## 2021-12-22 DIAGNOSIS — M19042 Primary osteoarthritis, left hand: Secondary | ICD-10-CM | POA: Diagnosis not present

## 2021-12-23 ENCOUNTER — Ambulatory Visit: Payer: Medicare Other | Admitting: Cardiology

## 2021-12-23 ENCOUNTER — Encounter: Payer: Self-pay | Admitting: Cardiology

## 2021-12-23 VITALS — BP 150/74 | HR 90 | Resp 18 | Ht 68.0 in | Wt 239.4 lb

## 2021-12-23 DIAGNOSIS — I2510 Atherosclerotic heart disease of native coronary artery without angina pectoris: Secondary | ICD-10-CM | POA: Diagnosis not present

## 2021-12-23 DIAGNOSIS — I10 Essential (primary) hypertension: Secondary | ICD-10-CM | POA: Diagnosis not present

## 2021-12-23 DIAGNOSIS — I77810 Thoracic aortic ectasia: Secondary | ICD-10-CM | POA: Diagnosis not present

## 2021-12-23 DIAGNOSIS — I700 Atherosclerosis of aorta: Secondary | ICD-10-CM

## 2021-12-23 DIAGNOSIS — I714 Abdominal aortic aneurysm, without rupture, unspecified: Secondary | ICD-10-CM

## 2021-12-23 DIAGNOSIS — R931 Abnormal findings on diagnostic imaging of heart and coronary circulation: Secondary | ICD-10-CM

## 2021-12-23 DIAGNOSIS — E1165 Type 2 diabetes mellitus with hyperglycemia: Secondary | ICD-10-CM

## 2021-12-23 NOTE — Progress Notes (Signed)
ID:  Walter White., DOB 16-Dec-1950, MRN [id number]  PCP:  Deland Pretty, MD  Cardiologist:  Rex Kras, DO, [facility] (established care 10/20/21)  Date: 12/23/21 Last Office Visit: 10/20/2021  Chief Complaint  Patient presents with   Hypertension   Follow-up    8 weeks    HPI  Walter White. is a 71 y.o. 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   Patient was referred to the practice for evaluation of severe coronary artery calcification.  This study was done to further risk stratify him from a cardiovascular standpoint.  Overall functional capacity is limited due to prior back surgeries.  Denies anginal discomfort or heart failure symptoms.  Currently working with PCP with medication titration for his blood pressures.  He is currently taking all of his medications in the morning approximately 8 tabs.  Midday feels tired and fatigue requiring him to take a nap.  At times feels lightheaded and dizziness with bending forward.  FUNCTIONAL STATUS: No structured exercise program or daily routine.    ALLERGIES: Allergies  Allergen Reactions   Pork-Derived Products Shortness Of Breath   Shellfish Allergy Shortness Of Breath   Lovastatin Other (See Comments)    Bone pain    Penicillins     UNSPECIFIED CHILDHOOD REACTION  Has patient had a PCN reaction causing immediate rash, facial/tongue/throat swelling, SOB or lightheadedness with hypotension: Unknown Has patient had a PCN reaction causing severe rash involving mucus membranes or skin necrosis: Unknown Has patient had a PCN reaction that required hospitalization: Unknown Has patient had a PCN reaction occurring within the last 10 years:  No If all of the above answers are "NO", then may proceed with Cephalosporin use.     MEDICATION LIST PRIOR TO VISIT: Current Meds  Medication Sig   acetaminophen (TYLENOL) 650 MG CR tablet Take 650 mg by mouth every 8 (eight) hours as needed for pain.   aspirin EC 81 MG tablet Take 1 tablet (81 mg total) by mouth daily. Swallow whole.   Cholecalciferol (VITAMIN D) 125 MCG (5000 UT) CAPS Take 5,000 Units by mouth daily.   diclofenac (VOLTAREN) 50 MG EC tablet Take 50 mg by mouth 2 (two) times daily.   gabapentin (NEURONTIN) 600 MG tablet Take 1 tablet by mouth daily.   metFORMIN (GLUCOPHAGE-XR) 500 MG 24 hr tablet Take 500 mg by mouth daily.   olmesartan-hydrochlorothiazide (BENICAR HCT) 40-25 MG tablet Take 1 tablet by mouth daily.   pantoprazole (PROTONIX) 40 MG tablet Take 40 mg by mouth daily.   rosuvastatin (CRESTOR) 20 MG tablet Take 5 mg by mouth daily.   traMADol (ULTRAM) 50 MG tablet      PAST MEDICAL HISTORY: Past Medical History:  Diagnosis Date   AAA (abdominal aortic aneurysm) (HCC)    4.0 cm AAA 08/22/17 U/S   Allergy    Allergic Rhinitis   Anemia    Arthritis    spinal steniosis   Benign prostate hyperplasia    with lower urinary tract symptoms   Carpal tunnel syndrome on right    Cataracts, both eyes    Chronic back pain    caused by spinal stenosis   Elevated PSA    GERD (gastroesophageal reflux disease)    Hyperlipidemia    Hypertension  Essential.     MGUS (monoclonal gammopathy of unknown significance)    Microhematuria    OA (osteoarthritis) of knee    Pneumonia    Hx -when very young   Pre-diabetes    Seasonal allergies    Spinal stenosis    of lumbosacral region.  Causes chronic back pain.   Upper abdominal pain     PAST SURGICAL HISTORY: Past Surgical History:  Procedure Laterality Date   ABDOMINAL EXPOSURE N/A 09/05/2017   Procedure: ABDOMINAL EXPOSURE;  Surgeon: Rosetta Posner, MD;  Location: [facility] OR;  Service: Vascular;  Laterality:  N/A;   ANTERIOR LAT LUMBAR FUSION Left 01/26/2016   Procedure: LEFT SIDED LATERAL INTERBODY FUSION, LUMBAR 3-4 WITH INSTRUMENTATION AND ALLOGRAFT;  Surgeon: Phylliss Bob, MD;  Location: [place];  Service: Orthopedics;  Laterality: Left;  LEFT SIDED LATERAL INTERBODY FUSION, LUMBAR 3-4 WITH INSTRUMENTATION AND ALLOGRAFT   ANTERIOR LAT LUMBAR FUSION Right 08/14/2018   Procedure: RIGHT LUMBAR two to LUMBAR three LATERAL INTERBODY FUSION WITH INSTRUMENTATION AND ALLOGRAFT;  Surgeon: Phylliss Bob, MD;  Location: [place];  Service: Orthopedics;  Laterality: Right;   ANTERIOR LATERAL LUMBAR FUSION WITH PERCUTANEOUS SCREW 1 LEVEL N/A 09/05/2017   Procedure: LUMBAR 5 - SACRUM 1 ANTERIOR LUMBAR INTERBODY FUSION WITH INSTRUMENTATION AND ALLOGRAFT;  Surgeon: Phylliss Bob, MD;  Location: [place];  Service: Orthopedics;  Laterality: N/A;   BACK SURGERY  01/2015   dr. Lynann Bologna. [phone].   CARPAL TUNNEL RELEASE     right    CATARACT EXTRACTION, BILATERAL     COLONOSCOPY     COLONOSCOPY  2010,2016   polyp   ELBOW SURGERY     right ulnar release   ELBOW SURGERY     LEFT CTR   EYE SURGERY     FINGER ARTHROPLASTY Left 04/04/2021   Procedure: LEFT INDEX FINGER IMPLANT ARTHROPLASTY AT METACARPOPHALANGEAL JOINT;  Surgeon: Milly Jakob, MD;  Location: [place];  Service: Orthopedics;  Laterality: Left;  PRE-OP BLOCK,   FINGER ARTHROPLASTY Left 09/16/2021   Procedure: LEFT INDEX FINGER METACARPOPHALANGEAL JOINT REVISION ARTHROPLASTY;  Surgeon: Milly Jakob, MD;  Location: [place];  Service: Orthopedics;  Laterality: Left;   JOINT REPLACEMENT     left- Sept 2016   KNEE ARTHROSCOPY  11/23/2010   Procedure: ARTHROSCOPY KNEE;  Surgeon: Kerin Salen;  Location: [place];  Service: Orthopedics;  Laterality: Left;  arthroscopy left knee   KNEE ARTHROSCOPY     left x 2   Knee scar tissue removal Left 2018   LATERAL FUSION LUMBAR SPINE  01./17/2018   polypectomy   2010   SHOULDER ARTHROSCOPY Left    x2   TONSILLECTOMY     TOTAL KNEE ARTHROPLASTY Left 09/21/2014   Procedure: TOTAL KNEE ARTHROPLASTY;  Surgeon: Frederik Pear, MD;  Location: [place];  Service: Orthopedics;  Laterality: Left;   ULNAR NERVE TRANSPOSITION Left 04/16/2017   Procedure: LEFT ULNAR NEUROPLASTY AT THE WRIST AND TRANSPOSITION AT THE ELBOW;  Surgeon: Milly Jakob, MD;  Location: [place];  Service: Orthopedics;  Laterality: Left;   WISDOM TOOTH EXTRACTION      FAMILY HISTORY: The patient family history includes Cancer in his mother; Colon cancer (age of onset: 45) in his maternal uncle; Liver disease in his father.  SOCIAL HISTORY:  The patient  reports that he quit smoking about 41 years ago. His smoking use included cigarettes. He has a 5.00 pack-year smoking history. He has never used smokeless  tobacco. He reports current alcohol use. He reports that he does not use drugs.  REVIEW OF SYSTEMS: Review of Systems  Cardiovascular:  Negative for chest pain, claudication, dyspnea on exertion, irregular heartbeat, leg swelling, near-syncope, orthopnea, palpitations, paroxysmal nocturnal dyspnea and syncope.  Respiratory:  Negative for shortness of breath.   Hematologic/Lymphatic: Negative for bleeding problem.  Musculoskeletal:  Positive for back pain (several back surgery). Negative for muscle cramps and myalgias.  Neurological:  Negative for dizziness and light-headedness.    PHYSICAL EXAM: Today's Vitals   12/23/21 0941  BP: (!) 150/74  Pulse: 90  Resp: 18  SpO2: 97%  Weight: 239 lb 6.4 oz (108.6 kg)  Height: '5\' 8"'$  (1.727 m)   Body mass index is 36.4 kg/m.  Physical Exam  Constitutional: No distress.  Age appropriate, hemodynamically stable.   Neck: No JVD present.  Cardiovascular: Normal rate, regular rhythm, S1 normal, S2 normal and intact distal pulses. Exam reveals no gallop, no S3 and no S4.  No murmur heard. Pulses:      Radial pulses are 2+  on the right side and 2+ on the left side.       Dorsalis pedis pulses are 2+ on the right side and 2+ on the left side.       Posterior tibial pulses are 2+ on the right side and 2+ on the left side.  Pulmonary/Chest: Effort normal and breath sounds normal. No stridor. He has no wheezes. He has no rales.  Abdominal: Soft. Bowel sounds are normal. He exhibits no distension. There is no abdominal tenderness.  Musculoskeletal:        General: No edema.     Cervical back: Neck supple.  Neurological: He is alert and oriented to person, place, and time. He has intact cranial nerves (2-12).  Skin: Skin is warm and moist.   CARDIAC DATABASE: EKG: 10/20/2021: NSR, 80bpm, left axis, without underlying injury pattern.   Echocardiogram: 11/07/2021 1. Left ventricular ejection fraction, by estimation, is 55 to 60%. The left ventricle has normal function. The left ventricle has no regional wall motion abnormalities. There is moderate left ventricular hypertrophy. Left ventricular diastolic  parameters were normal. 2. Right ventricular systolic function is normal. The right ventricular size is normal. 3. The mitral valve is degenerative. No evidence of mitral valve regurgitation. No evidence of mitral stenosis. 4. The aortic valve is tricuspid. Aortic valve regurgitation is not visualized. Aortic valve sclerosis is present, with no evidence of aortic valve stenosis. 5. Aortic dilatation noted. There is mild dilatation of the aortic root, measuring 39 mm. There is mild dilatation of the ascending aorta, measuring 41 mm.    Stress Testing: [facility] stress test 11/14/2021: Prominent diaphragmatic attenuation noted inferior wall.  Myocardial perfusion does not reveal ischemia or scar.  Overall LV systolic function is normal without regional wall motion abnormalities. Calculated Stress LV EF: 49%, visually appears normal. Correlate with echocardiogram.  Non-diagnostic ECG stress. The heart rate  response was consistent with Regadenoson.  No previous exam available for comparison. Low risk.   Heart Catheterization: None  Coronary artery calcium score: 07/20/2021 Left Main: 0   LAD: 587   LCx: 89.3   RCA: 470   Total Agatston Score: 1455   MESA database percentile: 88   AORTA MEASUREMENTS:   Ascending Aorta: 42 cm   Descending Aorta: 32 cm 1. Total Agatston score of 1455. This places the patient in the 88th percentile for age, gender and race/ethnicity. 2. Aortic atherosclerosis. 3.  Mild aneurysmal dilation of the ascending thoracic aorta.  Recommend annual imaging followup by CTA or MRA  [place] AAA Duplex limited: 12/01/2021: Abdominal Aorta: There is evidence of abnormal dilatation of the proximal Abdominal aorta. The largest aortic measurement is 4.0 cm. The largest aortic diameter remains essentially unchanged compared to prior exam.  Previous diameter measurement was 4.0 cm obtained on 03/31/2019.   LABORATORY DATA:    Latest Ref Rng & Units 11/23/2021    8:45 AM 06/01/2021    9:35 AM 05/25/2021    9:08 AM  CBC  WBC 4.0 - 10.5 K/uL 3.1  2.9  3.7   Hemoglobin 13.0 - 17.0 g/dL 10.4  10.9  11.4   Hematocrit 39.0 - 52.0 % 30.6  32.2  33.4   Platelets 150 - 400 K/uL 135  131  137        Latest Ref Rng & Units 11/23/2021    8:45 AM 09/14/2021   12:38 PM 05/25/2021    9:08 AM  CMP  Glucose 70 - 99 mg/dL 132  162  135   BUN 8 - 23 mg/dL '27  27  24   '$ Creatinine 0.61 - 1.24 mg/dL 1.15  1.10  1.01   Sodium 135 - 145 mmol/L 137  134  135   Potassium 3.5 - 5.1 mmol/L 4.3  4.3  4.6   Chloride 98 - 111 mmol/L 105  103  103   CO2 22 - 32 mmol/L '27  23  28   '$ Calcium 8.9 - 10.3 mg/dL 9.8  9.5  9.8   Total Protein 6.5 - 8.1 g/dL 9.2   9.0   Total Bilirubin 0.3 - 1.2 mg/dL 0.6   0.6   Alkaline Phos 38 - 126 U/L 92   91   AST 15 - 41 U/L 32   24   ALT 0 - 44 U/L 21   19     Lipid Panel  No results found for: "CHOL", "TRIG", "HDL", "CHOLHDL", "VLDL", "LDLCALC",  "LDLDIRECT", "LABVLDL"  No components found for: "NTPROBNP" No results for input(s): "PROBNP" in the last 8760 hours. No results for input(s): "TSH" in the last 8760 hours.  BMP Recent Labs    05/25/21 0908 09/14/21 1238 11/23/21 0845  NA 135 134* 137  K 4.6 4.3 4.3  CL 103 103 105  CO2 '28 23 27  '$ GLUCOSE 135* 162* 132*  BUN 24* 27* 27*  CREATININE 1.01 1.10 1.15  CALCIUM 9.8 9.5 9.8  GFRNONAA >60 >60 >60   HEMOGLOBIN A1C Lab Results  Component Value Date   HGBA1C 6.2 (H) 08/28/2017   MPG 131.24 08/28/2017    IMPRESSION:    ICD-10-CM   1. Coronary atherosclerosis due to calcified coronary lesion  I25.10    I25.84     2. Agatston CAC score, >400  R93.1     3. Atherosclerosis of aorta (HCC)  I70.0     4. Ascending aorta dilation (HCC)  I77.810     5. Benign hypertension  I10     6. Type 2 diabetes mellitus with hyperglycemia, without long-term current use of insulin (HCC)  E11.65     7. Abdominal aortic aneurysm (AAA) without rupture, unspecified part ([place])  I71.40        RECOMMENDATIONS: Walter White. is a 71 y.o. 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   Coronary atherosclerosis due to calcified coronary lesion / Agatston CAC score, >400 / Atherosclerosis of aorta (HCC) Total CAC 1455, 88 percentile Denies angina pectoris Continue ASA and statin.  Reviewed echo and MPI results reviewed at today's visit.  No additional work up at this time.   Ascending aorta dilation (HCC) Noted on coronary calcium score dated July/2023. Reemphasized the importance of blood pressure management - working w/ PCP. Goal SBP 120-197mHG Radiology recommends annual follow-up -currently  managed by primary care provider.  Benign hypertension Office blood pressures are not well controlled. May also be secondary to underlying chronic back pain Medications reconciled Has an appointment with PCP next week for further medication titration. Recommended taking som BP meds in am and some in pm.  Currently takes 8 tabs in the morning and feels tried mid day and lightheaded w/ bending forward.  Reemphasized the importance of low-salt diet Monitor for now  Type 2 diabetes mellitus with hyperglycemia, without long-term current use of insulin ([place] Patient is emphasized on the importance of glycemic control. Currently on ARB, statin therapy. May consider Jardiance if clinically indicated-we will defer to PCP  Type 2 diabetes mellitus with hyperlipidemia ([place] Currently on rosuvastatin.   He denies myalgia or other side effects. No recent labs available for review. Recommended goal LDL at least less than 70 mg/dL and if able closer to 55 mg/dL given his severe CAC and diabetes Currently managed by primary care provider.  Abdominal aortic aneurysm (AAA) without rupture, unspecified part ([place] Reemphasized importance of blood pressure management. Follows up with vascular surgery.  FINAL MEDICATION LIST END OF ENCOUNTER: No orders of the defined types were placed in this encounter.   Medications Discontinued During This Encounter  Medication Reason   meloxicam (MOBIC) 15 MG tablet Change in therapy   Psyllium (EQ DAILY FIBER PO)      Current Outpatient Medications:    acetaminophen (TYLENOL) 650 MG CR tablet, Take 650 mg by mouth every 8 (eight) hours as needed for pain., Disp: , Rfl:    aspirin EC 81 MG tablet, Take 1 tablet (81 mg total) by mouth daily. Swallow whole., Disp: 30 tablet, Rfl: 12   Cholecalciferol (VITAMIN D) 125 MCG (5000 UT) CAPS, Take 5,000 Units by mouth daily., Disp: , Rfl:    diclofenac (VOLTAREN) 50 MG EC tablet, Take 50 mg by mouth 2 (two) times  daily., Disp: , Rfl:    gabapentin (NEURONTIN) 600 MG tablet, Take 1 tablet by mouth daily., Disp: , Rfl:    metFORMIN (GLUCOPHAGE-XR) 500 MG 24 hr tablet, Take 500 mg by mouth daily., Disp: , Rfl:    olmesartan-hydrochlorothiazide (BENICAR HCT) 40-25 MG tablet, Take 1 tablet by mouth daily., Disp: , Rfl:    pantoprazole (PROTONIX) 40 MG tablet, Take 40 mg by mouth daily., Disp: , Rfl:    rosuvastatin (CRESTOR) 20 MG tablet, Take 5 mg by mouth daily., Disp: , Rfl:    traMADol (ULTRAM) 50 MG tablet, , Disp: , Rfl:   No orders of the defined types were placed in this encounter.   There are no Patient Instructions on file for this visit.   --Continue cardiac medications as reconciled in final medication list. --Return in about 8 months (around 08/24/2022) for Follow up, Coronary artery calcification. or sooner if needed. --Continue follow-up with your primary care physician regarding the management of your other chronic comorbid conditions.  Patient's questions and concerns  were addressed to his satisfaction. He voices understanding of the instructions provided during this encounter.   This note was created using a voice recognition software as a result there may be grammatical errors inadvertently enclosed that do not reflect the nature of this encounter. Every attempt is made to correct such errors.  Rex Kras, [place], [facility]  Pager: [phone] Office: [phone]

## 2021-12-26 DIAGNOSIS — M19042 Primary osteoarthritis, left hand: Secondary | ICD-10-CM | POA: Diagnosis not present

## 2021-12-28 DIAGNOSIS — M19042 Primary osteoarthritis, left hand: Secondary | ICD-10-CM | POA: Diagnosis not present

## 2022-01-10 DIAGNOSIS — I2510 Atherosclerotic heart disease of native coronary artery without angina pectoris: Secondary | ICD-10-CM | POA: Diagnosis not present

## 2022-01-10 DIAGNOSIS — E785 Hyperlipidemia, unspecified: Secondary | ICD-10-CM | POA: Diagnosis not present

## 2022-01-10 DIAGNOSIS — M549 Dorsalgia, unspecified: Secondary | ICD-10-CM | POA: Diagnosis not present

## 2022-01-10 DIAGNOSIS — I10 Essential (primary) hypertension: Secondary | ICD-10-CM | POA: Diagnosis not present

## 2022-01-10 DIAGNOSIS — E1122 Type 2 diabetes mellitus with diabetic chronic kidney disease: Secondary | ICD-10-CM | POA: Diagnosis not present

## 2022-01-10 DIAGNOSIS — K219 Gastro-esophageal reflux disease without esophagitis: Secondary | ICD-10-CM | POA: Diagnosis not present

## 2022-01-11 DIAGNOSIS — M19042 Primary osteoarthritis, left hand: Secondary | ICD-10-CM | POA: Diagnosis not present

## 2022-01-13 DIAGNOSIS — M19042 Primary osteoarthritis, left hand: Secondary | ICD-10-CM | POA: Diagnosis not present

## 2022-01-16 ENCOUNTER — Ambulatory Visit (HOSPITAL_COMMUNITY)
Admission: RE | Admit: 2022-01-16 | Discharge: 2022-01-16 | Disposition: A | Discharge status: Home / Self Care | Payer: Medicare Other | Source: Ambulatory Visit | Attending: Hematology and Oncology | Admitting: Hematology and Oncology

## 2022-01-16 DIAGNOSIS — D472 Monoclonal gammopathy: Secondary | ICD-10-CM | POA: Insufficient documentation

## 2022-01-17 DIAGNOSIS — M19042 Primary osteoarthritis, left hand: Secondary | ICD-10-CM | POA: Diagnosis not present

## 2022-01-18 ENCOUNTER — Telehealth: Payer: Self-pay

## 2022-01-18 NOTE — Telephone Encounter (Addendum)
Spoke with patient to advise of message below. Patient voiced appreciation.  ----- Message from Orson Slick, MD sent at 01/18/2022  9:20 AM EST ----- Please let Walter White know that there are no concerning findings on his bone survey Xray or prior bloodwork. Overall his MGUS is stable. We will see him in May 2024 as scheduled.   ----- Message ----- From: Interface, Rad Results In Sent: 01/16/2022   3:48 PM EST To: Orson Slick, MD

## 2022-01-19 DIAGNOSIS — M19042 Primary osteoarthritis, left hand: Secondary | ICD-10-CM | POA: Diagnosis not present

## 2022-01-25 DIAGNOSIS — M4316 Spondylolisthesis, lumbar region: Secondary | ICD-10-CM | POA: Diagnosis not present

## 2022-01-25 DIAGNOSIS — M4326 Fusion of spine, lumbar region: Secondary | ICD-10-CM | POA: Diagnosis not present

## 2022-02-03 DIAGNOSIS — M19042 Primary osteoarthritis, left hand: Secondary | ICD-10-CM | POA: Diagnosis not present

## 2022-02-06 DIAGNOSIS — M4316 Spondylolisthesis, lumbar region: Secondary | ICD-10-CM | POA: Diagnosis not present

## 2022-02-06 DIAGNOSIS — Z6837 Body mass index (BMI) 37.0-37.9, adult: Secondary | ICD-10-CM | POA: Diagnosis not present

## 2022-02-09 DIAGNOSIS — R972 Elevated prostate specific antigen [PSA]: Secondary | ICD-10-CM | POA: Diagnosis not present

## 2022-02-17 DIAGNOSIS — M19042 Primary osteoarthritis, left hand: Secondary | ICD-10-CM | POA: Diagnosis not present

## 2022-02-20 DIAGNOSIS — M19042 Primary osteoarthritis, left hand: Secondary | ICD-10-CM | POA: Diagnosis not present

## 2022-02-21 DIAGNOSIS — M4316 Spondylolisthesis, lumbar region: Secondary | ICD-10-CM | POA: Diagnosis not present

## 2022-02-21 DIAGNOSIS — M545 Low back pain, unspecified: Secondary | ICD-10-CM | POA: Diagnosis not present

## 2022-02-22 DIAGNOSIS — M545 Low back pain, unspecified: Secondary | ICD-10-CM | POA: Diagnosis not present

## 2022-02-22 DIAGNOSIS — M4316 Spondylolisthesis, lumbar region: Secondary | ICD-10-CM | POA: Diagnosis not present

## 2022-02-27 DIAGNOSIS — M545 Low back pain, unspecified: Secondary | ICD-10-CM | POA: Diagnosis not present

## 2022-02-27 DIAGNOSIS — M4316 Spondylolisthesis, lumbar region: Secondary | ICD-10-CM | POA: Diagnosis not present

## 2022-03-03 DIAGNOSIS — M19042 Primary osteoarthritis, left hand: Secondary | ICD-10-CM | POA: Diagnosis not present

## 2022-03-03 DIAGNOSIS — M25511 Pain in right shoulder: Secondary | ICD-10-CM | POA: Diagnosis not present

## 2022-03-09 DIAGNOSIS — M545 Low back pain, unspecified: Secondary | ICD-10-CM | POA: Diagnosis not present

## 2022-03-09 DIAGNOSIS — M4316 Spondylolisthesis, lumbar region: Secondary | ICD-10-CM | POA: Diagnosis not present

## 2022-03-23 DIAGNOSIS — M4316 Spondylolisthesis, lumbar region: Secondary | ICD-10-CM | POA: Diagnosis not present

## 2022-03-23 DIAGNOSIS — M545 Low back pain, unspecified: Secondary | ICD-10-CM | POA: Diagnosis not present

## 2022-03-24 DIAGNOSIS — M19042 Primary osteoarthritis, left hand: Secondary | ICD-10-CM | POA: Diagnosis not present

## 2022-03-28 DIAGNOSIS — M545 Low back pain, unspecified: Secondary | ICD-10-CM | POA: Diagnosis not present

## 2022-03-28 DIAGNOSIS — M4316 Spondylolisthesis, lumbar region: Secondary | ICD-10-CM | POA: Diagnosis not present

## 2022-03-29 DIAGNOSIS — M67911 Unspecified disorder of synovium and tendon, right shoulder: Secondary | ICD-10-CM | POA: Diagnosis not present

## 2022-03-30 ENCOUNTER — Telehealth: Payer: Self-pay | Admitting: *Deleted

## 2022-03-30 NOTE — Patient Outreach (Signed)
  Care Coordination   03/30/2022 Name: Walter White. MRN: [id number] DOB: May 16, 1950   Care Coordination Outreach Attempts:  An unsuccessful telephone outreach was attempted today to offer the patient information about available care coordination services as a benefit of their health plan.   Follow Up Plan:  Additional outreach attempts will be made to offer the patient care coordination information and services.   Encounter Outcome:  No Answer   Care Coordination Interventions:  No, not indicated    Eduard Clos, MSW, [place] Worker [facility] [phone]

## 2022-04-04 DIAGNOSIS — M4316 Spondylolisthesis, lumbar region: Secondary | ICD-10-CM | POA: Diagnosis not present

## 2022-04-04 DIAGNOSIS — M545 Low back pain, unspecified: Secondary | ICD-10-CM | POA: Diagnosis not present

## 2022-04-04 DIAGNOSIS — M19049 Primary osteoarthritis, unspecified hand: Secondary | ICD-10-CM | POA: Diagnosis not present

## 2022-04-06 DIAGNOSIS — M25511 Pain in right shoulder: Secondary | ICD-10-CM | POA: Diagnosis not present

## 2022-04-10 DIAGNOSIS — M4316 Spondylolisthesis, lumbar region: Secondary | ICD-10-CM | POA: Diagnosis not present

## 2022-04-10 DIAGNOSIS — M545 Low back pain, unspecified: Secondary | ICD-10-CM | POA: Diagnosis not present

## 2022-04-10 DIAGNOSIS — M25511 Pain in right shoulder: Secondary | ICD-10-CM | POA: Diagnosis not present

## 2022-04-12 DIAGNOSIS — E1122 Type 2 diabetes mellitus with diabetic chronic kidney disease: Secondary | ICD-10-CM | POA: Diagnosis not present

## 2022-04-12 DIAGNOSIS — K219 Gastro-esophageal reflux disease without esophagitis: Secondary | ICD-10-CM | POA: Diagnosis not present

## 2022-04-12 DIAGNOSIS — I10 Essential (primary) hypertension: Secondary | ICD-10-CM | POA: Diagnosis not present

## 2022-04-12 DIAGNOSIS — I2510 Atherosclerotic heart disease of native coronary artery without angina pectoris: Secondary | ICD-10-CM | POA: Diagnosis not present

## 2022-04-12 DIAGNOSIS — E785 Hyperlipidemia, unspecified: Secondary | ICD-10-CM | POA: Diagnosis not present

## 2022-04-13 DIAGNOSIS — M4316 Spondylolisthesis, lumbar region: Secondary | ICD-10-CM | POA: Diagnosis not present

## 2022-04-13 DIAGNOSIS — M545 Low back pain, unspecified: Secondary | ICD-10-CM | POA: Diagnosis not present

## 2022-04-17 DIAGNOSIS — M545 Low back pain, unspecified: Secondary | ICD-10-CM | POA: Diagnosis not present

## 2022-04-17 DIAGNOSIS — M4316 Spondylolisthesis, lumbar region: Secondary | ICD-10-CM | POA: Diagnosis not present

## 2022-04-20 DIAGNOSIS — M4316 Spondylolisthesis, lumbar region: Secondary | ICD-10-CM | POA: Diagnosis not present

## 2022-04-20 DIAGNOSIS — M545 Low back pain, unspecified: Secondary | ICD-10-CM | POA: Diagnosis not present

## 2022-04-26 DIAGNOSIS — M25561 Pain in right knee: Secondary | ICD-10-CM | POA: Diagnosis not present

## 2022-05-08 DIAGNOSIS — M25511 Pain in right shoulder: Secondary | ICD-10-CM | POA: Diagnosis not present

## 2022-05-09 DIAGNOSIS — E785 Hyperlipidemia, unspecified: Secondary | ICD-10-CM | POA: Diagnosis not present

## 2022-05-09 DIAGNOSIS — R8289 Other abnormal findings on cytological and histological examination of urine: Secondary | ICD-10-CM | POA: Diagnosis not present

## 2022-05-09 DIAGNOSIS — Z125 Encounter for screening for malignant neoplasm of prostate: Secondary | ICD-10-CM | POA: Diagnosis not present

## 2022-05-10 ENCOUNTER — Encounter: Payer: Self-pay | Admitting: Internal Medicine

## 2022-05-12 DIAGNOSIS — I2510 Atherosclerotic heart disease of native coronary artery without angina pectoris: Secondary | ICD-10-CM | POA: Diagnosis not present

## 2022-05-12 DIAGNOSIS — I10 Essential (primary) hypertension: Secondary | ICD-10-CM | POA: Diagnosis not present

## 2022-05-12 DIAGNOSIS — K219 Gastro-esophageal reflux disease without esophagitis: Secondary | ICD-10-CM | POA: Diagnosis not present

## 2022-05-12 DIAGNOSIS — M48061 Spinal stenosis, lumbar region without neurogenic claudication: Secondary | ICD-10-CM | POA: Diagnosis not present

## 2022-05-12 DIAGNOSIS — R3129 Other microscopic hematuria: Secondary | ICD-10-CM | POA: Diagnosis not present

## 2022-05-12 DIAGNOSIS — R972 Elevated prostate specific antigen [PSA]: Secondary | ICD-10-CM | POA: Diagnosis not present

## 2022-05-12 DIAGNOSIS — D472 Monoclonal gammopathy: Secondary | ICD-10-CM | POA: Diagnosis not present

## 2022-05-12 DIAGNOSIS — Z0000 Encounter for general adult medical examination without abnormal findings: Secondary | ICD-10-CM | POA: Diagnosis not present

## 2022-05-12 DIAGNOSIS — I714 Abdominal aortic aneurysm, without rupture, unspecified: Secondary | ICD-10-CM | POA: Diagnosis not present

## 2022-05-12 DIAGNOSIS — I7121 Aneurysm of the ascending aorta, without rupture: Secondary | ICD-10-CM | POA: Diagnosis not present

## 2022-05-12 DIAGNOSIS — J309 Allergic rhinitis, unspecified: Secondary | ICD-10-CM | POA: Diagnosis not present

## 2022-05-12 DIAGNOSIS — E1122 Type 2 diabetes mellitus with diabetic chronic kidney disease: Secondary | ICD-10-CM | POA: Diagnosis not present

## 2022-05-30 ENCOUNTER — Other Ambulatory Visit: Payer: Self-pay | Admitting: Physician Assistant

## 2022-05-30 DIAGNOSIS — D472 Monoclonal gammopathy: Secondary | ICD-10-CM

## 2022-05-31 ENCOUNTER — Inpatient Hospital Stay: Payer: Medicare Other | Attending: Hematology and Oncology

## 2022-05-31 ENCOUNTER — Other Ambulatory Visit: Payer: Self-pay

## 2022-05-31 DIAGNOSIS — Z7982 Long term (current) use of aspirin: Secondary | ICD-10-CM | POA: Insufficient documentation

## 2022-05-31 DIAGNOSIS — Z800 Family history of malignant neoplasm of digestive organs: Secondary | ICD-10-CM | POA: Diagnosis not present

## 2022-05-31 DIAGNOSIS — D472 Monoclonal gammopathy: Secondary | ICD-10-CM | POA: Diagnosis not present

## 2022-05-31 DIAGNOSIS — Z79899 Other long term (current) drug therapy: Secondary | ICD-10-CM | POA: Insufficient documentation

## 2022-05-31 DIAGNOSIS — Z87891 Personal history of nicotine dependence: Secondary | ICD-10-CM | POA: Insufficient documentation

## 2022-05-31 LAB — CBC WITH DIFFERENTIAL (CANCER CENTER ONLY)
Abs Immature Granulocytes: 0.01 10*3/uL (ref 0.00–0.07)
Basophils Absolute: 0 10*3/uL (ref 0.0–0.1)
Basophils Relative: 1 %
Eosinophils Absolute: 0.1 10*3/uL (ref 0.0–0.5)
Eosinophils Relative: 2 %
HCT: 33.7 % — ABNORMAL LOW (ref 39.0–52.0)
Hemoglobin: 11.4 g/dL — ABNORMAL LOW (ref 13.0–17.0)
Immature Granulocytes: 0 %
Lymphocytes Relative: 17 %
Lymphs Abs: 0.7 10*3/uL (ref 0.7–4.0)
MCH: 31.9 pg (ref 26.0–34.0)
MCHC: 33.8 g/dL (ref 30.0–36.0)
MCV: 94.4 fL (ref 80.0–100.0)
Monocytes Absolute: 0.3 10*3/uL (ref 0.1–1.0)
Monocytes Relative: 7 %
Neutro Abs: 3 10*3/uL (ref 1.7–7.7)
Neutrophils Relative %: 73 %
Platelet Count: 152 10*3/uL (ref 150–400)
RBC: 3.57 MIL/uL — ABNORMAL LOW (ref 4.22–5.81)
RDW: 14.4 % (ref 11.5–15.5)
WBC Count: 4.1 10*3/uL (ref 4.0–10.5)
nRBC: 0 % (ref 0.0–0.2)

## 2022-05-31 LAB — CMP (CANCER CENTER ONLY)
ALT: 31 U/L (ref 0–44)
AST: 46 U/L — ABNORMAL HIGH (ref 15–41)
Albumin: 4.2 g/dL (ref 3.5–5.0)
Alkaline Phosphatase: 76 U/L (ref 38–126)
Anion gap: 4 — ABNORMAL LOW (ref 5–15)
BUN: 28 mg/dL — ABNORMAL HIGH (ref 8–23)
CO2: 28 mmol/L (ref 22–32)
Calcium: 9.9 mg/dL (ref 8.9–10.3)
Chloride: 105 mmol/L (ref 98–111)
Creatinine: 1.02 mg/dL (ref 0.61–1.24)
GFR, Estimated: >60 mL/min (ref >60)
Glucose, Bld: 162 mg/dL — ABNORMAL HIGH (ref 70–99)
Potassium: 4.4 mmol/L (ref 3.5–5.1)
Sodium: 137 mmol/L (ref 135–145)
Total Bilirubin: 0.8 mg/dL (ref 0.3–1.2)
Total Protein: 8.9 g/dL — ABNORMAL HIGH (ref 6.5–8.1)

## 2022-05-31 LAB — LACTATE DEHYDROGENASE: LDH: 156 U/L (ref 98–192)

## 2022-06-01 LAB — KAPPA/LAMBDA LIGHT CHAINS
Kappa free light chain: 76.4 mg/L — ABNORMAL HIGH (ref 3.3–19.4)
Kappa, lambda light chain ratio: 7.88 — ABNORMAL HIGH (ref 0.26–1.65)
Lambda free light chains: 9.7 mg/L (ref 5.7–26.3)

## 2022-06-07 ENCOUNTER — Inpatient Hospital Stay (HOSPITAL_BASED_OUTPATIENT_CLINIC_OR_DEPARTMENT_OTHER): Payer: Medicare Other | Admitting: Hematology and Oncology

## 2022-06-07 ENCOUNTER — Other Ambulatory Visit: Payer: Self-pay

## 2022-06-07 ENCOUNTER — Other Ambulatory Visit: Payer: Self-pay | Admitting: *Deleted

## 2022-06-07 VITALS — BP 152/65 | HR 44 | Temp 97.3°F | Resp 16 | Wt 229.7 lb

## 2022-06-07 DIAGNOSIS — Z7982 Long term (current) use of aspirin: Secondary | ICD-10-CM | POA: Diagnosis not present

## 2022-06-07 DIAGNOSIS — Z79899 Other long term (current) drug therapy: Secondary | ICD-10-CM | POA: Diagnosis not present

## 2022-06-07 DIAGNOSIS — D472 Monoclonal gammopathy: Secondary | ICD-10-CM

## 2022-06-07 DIAGNOSIS — D500 Iron deficiency anemia secondary to blood loss (chronic): Secondary | ICD-10-CM

## 2022-06-07 DIAGNOSIS — Z87891 Personal history of nicotine dependence: Secondary | ICD-10-CM | POA: Diagnosis not present

## 2022-06-07 DIAGNOSIS — Z800 Family history of malignant neoplasm of digestive organs: Secondary | ICD-10-CM | POA: Diagnosis not present

## 2022-06-07 NOTE — Progress Notes (Signed)
[facility] [phone]   [phone]  PROGRESS NOTE  Patient Care Team: Merri Brunette, MD as PCP - General (Internal Medicine) Gean Birchwood, MD as Consulting Physician (Orthopedic Surgery) Estill Bamberg, MD as Consulting Physician (Orthopedic Surgery) Magrinat, Valentino Hue, MD (Inactive) as Consulting Physician (Hematology and Oncology) Coletta Memos, MD as Consulting Physician (Neurosurgery) Esaw Dace, MD as Attending Physician (Urology)  Hematological/Oncological History # IgG Kappa Monoclonal Gammopathy of Undetermined Significance 04/02/2018: underwent bone marrow biopsy, showed 9% plasma cells. 10/21/2020: last visit with Dr. Darnelle Catalan 05/25/2021: Labs showed M protein 2.7, kappa 70, lambda 11.3, and ratio 6.19.  White blood cell count 3.7, hemoglobin 11.4, MCV 90.5, and platelets 137 06/01/2021: establish care with Dr. Leonides Schanz   Interval History:  Arletta Bale. 72 y.o. male with medical history significant for IgG kappa monoclonal gammopathy of undetermined significance who presents for a follow up visit. The patient's last visit was on 11/30/2021. In the interim since the last visit he has had no major changes in his health.  On exam today Mr. Krengel reports he did have a recent change in his medications which "teared up his stomach".  He notes that he was having nausea and vomiting and diarrhea.  When he was back to his original medications symptoms resolved.  He reports that he does have an upcoming visit for shoulder surgery because he was having lightheadedness and dizziness and fell and injured his shoulder.  He reports he does not sleep in the bed and tends to sleep in recliner.  He notes he is back on hydrocodone 3 times per day.  He reports his energy levels are okay and he is not having any frank bone or back pain.  He notes that otherwise he has been at his baseline level of health.  He otherwise denies any fevers, chills, sweats,  nausea, vomiting or diarrhea.  A full 10 point ROS is listed below.  MEDICAL HISTORY:  Past Medical History:  Diagnosis Date   AAA (abdominal aortic aneurysm) (HCC)    4.0 cm AAA 08/22/17 U/S   Allergy    Allergic Rhinitis   Anemia    Arthritis    spinal steniosis   Benign prostate hyperplasia    with lower urinary tract symptoms   Carpal tunnel syndrome on right    Cataracts, both eyes    Chronic back pain    caused by spinal stenosis   Elevated PSA    GERD (gastroesophageal reflux disease)    Hyperlipidemia    Hypertension    Essential.     MGUS (monoclonal gammopathy of unknown significance)    Microhematuria    OA (osteoarthritis) of knee    Pneumonia    Hx -when very young   Pre-diabetes    Seasonal allergies    Spinal stenosis    of lumbosacral region.  Causes chronic back pain.   Upper abdominal pain     SURGICAL HISTORY: Past Surgical History:  Procedure Laterality Date   ABDOMINAL EXPOSURE N/A 09/05/2017   Procedure: ABDOMINAL EXPOSURE;  Surgeon: Larina Earthly, MD;  Location: [facility] OR;  Service: Vascular;  Laterality: N/A;   ANTERIOR LAT LUMBAR FUSION Left 01/26/2016   Procedure: LEFT SIDED LATERAL INTERBODY FUSION, LUMBAR 3-4 WITH INSTRUMENTATION AND ALLOGRAFT;  Surgeon: Estill Bamberg, MD;  Location: [facility] OR;  Service: Orthopedics;  Laterality: Left;  LEFT SIDED LATERAL INTERBODY FUSION, LUMBAR 3-4 WITH INSTRUMENTATION AND ALLOGRAFT   ANTERIOR LAT LUMBAR FUSION Right 08/14/2018  Procedure: RIGHT LUMBAR two to LUMBAR three LATERAL INTERBODY FUSION WITH INSTRUMENTATION AND ALLOGRAFT;  Surgeon: Estill Bamberg, MD;  Location: [facility] OR;  Service: Orthopedics;  Laterality: Right;   ANTERIOR LATERAL LUMBAR FUSION WITH PERCUTANEOUS SCREW 1 LEVEL N/A 09/05/2017   Procedure: LUMBAR 5 - SACRUM 1 ANTERIOR LUMBAR INTERBODY FUSION WITH INSTRUMENTATION AND ALLOGRAFT;  Surgeon: Estill Bamberg, MD;  Location: [facility] OR;  Service: Orthopedics;  Laterality: N/A;   BACK SURGERY  01/2015   dr.  Yevette Edwards. [id number].   CARPAL TUNNEL RELEASE     right    CATARACT EXTRACTION, BILATERAL     COLONOSCOPY     COLONOSCOPY  2010,2016   polyp   ELBOW SURGERY     right ulnar release   ELBOW SURGERY     LEFT CTR   EYE SURGERY     FINGER ARTHROPLASTY Left 04/04/2021   Procedure: LEFT INDEX FINGER IMPLANT ARTHROPLASTY AT METACARPOPHALANGEAL JOINT;  Surgeon: Mack Hook, MD;  Location: [facility];  Service: Orthopedics;  Laterality: Left;  PRE-OP BLOCK,   FINGER ARTHROPLASTY Left 09/16/2021   Procedure: LEFT INDEX FINGER METACARPOPHALANGEAL JOINT REVISION ARTHROPLASTY;  Surgeon: Mack Hook, MD;  Location: [facility];  Service: Orthopedics;  Laterality: Left;   JOINT REPLACEMENT     left- Sept 2016   KNEE ARTHROSCOPY  11/23/2010   Procedure: ARTHROSCOPY KNEE;  Surgeon: Nestor Lewandowsky;  Location: [facility];  Service: Orthopedics;  Laterality: Left;  arthroscopy left knee   KNEE ARTHROSCOPY     left x 2   Knee scar tissue removal Left 2018   LATERAL FUSION LUMBAR SPINE  01./17/2018   polypectomy  2010   SHOULDER ARTHROSCOPY Left    x2   TONSILLECTOMY     TOTAL KNEE ARTHROPLASTY Left 09/21/2014   Procedure: TOTAL KNEE ARTHROPLASTY;  Surgeon: Gean Birchwood, MD;  Location: [facility] OR;  Service: Orthopedics;  Laterality: Left;   ULNAR NERVE TRANSPOSITION Left 04/16/2017   Procedure: LEFT ULNAR NEUROPLASTY AT THE WRIST AND TRANSPOSITION AT THE ELBOW;  Surgeon: Mack Hook, MD;  Location: [facility];  Service: Orthopedics;  Laterality: Left;   WISDOM TOOTH EXTRACTION      SOCIAL HISTORY: Social History   Socioeconomic History   Marital status: Married    Spouse name: Aram Beecham    Number of children: 2   Years of education: Some colle   Highest education level: Not on file  Occupational History   Occupation: Retired  Tobacco Use   Smoking status: Former    Packs/day: 0.50    Years: 10.00    Additional pack years: 0.00     Total pack years: 5.00    Types: Cigarettes    Quit date: 11/21/1980    Years since quitting: 41.5   Smokeless tobacco: Never  Vaping Use   Vaping Use: Never used  Substance and Sexual Activity   Alcohol use: Yes    Alcohol/week: 0.0 standard drinks of alcohol    Comment: Very rare   Drug use: No   Sexual activity: Not Currently  Other Topics Concern   Not on file  Social History Narrative   Lives with wife   Caffeine use: tea every day   Social Determinants of Health   Financial Resource Strain: Not on file  Food Insecurity: Not on file  Transportation Needs: Not on file  Physical Activity: Not on file  Stress: Not on file  Social Connections: Not on file  Intimate Partner Violence: Not  on file    FAMILY HISTORY: Family History  Problem Relation Age of Onset   Cancer Mother    Liver disease Father    Colon cancer Maternal Uncle 23   Colon polyps Neg Hx    Esophageal cancer Neg Hx    Rectal cancer Neg Hx    Stomach cancer Neg Hx     ALLERGIES:  is allergic to pork-derived products, shellfish allergy, lovastatin, and penicillins.  MEDICATIONS:  Current Outpatient Medications  Medication Sig Dispense Refill   acetaminophen (TYLENOL) 650 MG CR tablet Take 650 mg by mouth every 8 (eight) hours as needed for pain.     aspirin EC 81 MG tablet Take 1 tablet (81 mg total) by mouth daily. Swallow whole. 30 tablet 12   Cholecalciferol (VITAMIN D) 125 MCG (5000 UT) CAPS Take 5,000 Units by mouth daily.     diclofenac (VOLTAREN) 50 MG EC tablet Take 50 mg by mouth 2 (two) times daily.     gabapentin (NEURONTIN) 600 MG tablet Take 1 tablet by mouth daily.     metFORMIN (GLUCOPHAGE-XR) 500 MG 24 hr tablet Take 500 mg by mouth daily.     olmesartan-hydrochlorothiazide (BENICAR HCT) 40-25 MG tablet Take 1 tablet by mouth daily.     pantoprazole (PROTONIX) 40 MG tablet Take 40 mg by mouth daily.     rosuvastatin (CRESTOR) 20 MG tablet Take 5 mg by mouth daily.     traMADol  (ULTRAM) 50 MG tablet      No current facility-administered medications for this visit.    REVIEW OF SYSTEMS:   Constitutional: ( - ) fevers, ( - )  chills , ( - ) night sweats Eyes: ( - ) blurriness of vision, ( - ) double vision, ( - ) watery eyes Ears, nose, mouth, throat, and face: ( - ) mucositis, ( - ) sore throat Respiratory: ( - ) cough, ( - ) dyspnea, ( - ) wheezes Cardiovascular: ( - ) palpitation, ( - ) chest discomfort, ( - ) lower extremity swelling Gastrointestinal:  ( - ) nausea, ( - ) heartburn, ( - ) change in bowel habits Skin: ( - ) abnormal skin rashes Lymphatics: ( - ) new lymphadenopathy, ( - ) easy bruising Neurological: ( - ) numbness, ( - ) tingling, ( - ) new weaknesses Behavioral/Psych: ( - ) mood change, ( - ) new changes  All other systems were reviewed with the patient and are negative.  PHYSICAL EXAMINATION: ECOG PERFORMANCE STATUS: 1 - Symptomatic but completely ambulatory  Vitals:   06/07/22 0840  BP: (!) 152/65  Pulse: (!) 44  Resp: 16  Temp: (!) 97.3 F (36.3 C)  SpO2: 100%     Filed Weights   06/07/22 0840  Weight: 229 lb 11.2 oz (104.2 kg)      GENERAL: Well-appearing elderly Caucasian male, alert, no distress and comfortable SKIN: skin color, texture, turgor are normal, no rashes or significant lesions EYES: conjunctiva are pink and non-injected, sclera clear LUNGS: clear to auscultation and percussion with normal breathing effort HEART: regular rate & rhythm and no murmurs and no lower extremity edema Musculoskeletal: no cyanosis of digits and no clubbing  PSYCH: alert & oriented x 3, fluent speech NEURO: no focal motor/sensory deficits  LABORATORY DATA:  I have reviewed the data as listed    Latest Ref Rng & Units 05/31/2022    8:40 AM 11/23/2021    8:45 AM 06/01/2021    9:35 AM  CBC  WBC 4.0 - 10.5 K/uL 4.1  3.1  2.9   Hemoglobin 13.0 - 17.0 g/dL 16.1  09.6  04.5   Hematocrit 39.0 - 52.0 % 33.7  30.6  32.2   Platelets  150 - 400 K/uL 152  135  131        Latest Ref Rng & Units 05/31/2022    8:40 AM 11/23/2021    8:45 AM 09/14/2021   12:38 PM  CMP  Glucose 70 - 99 mg/dL 409  811  914   BUN 8 - 23 mg/dL 28  27  27    Creatinine 0.61 - 1.24 mg/dL 7.82  9.56  2.13   Sodium 135 - 145 mmol/L 137  137  134   Potassium 3.5 - 5.1 mmol/L 4.4  4.3  4.3   Chloride 98 - 111 mmol/L 105  105  103   CO2 22 - 32 mmol/L 28  27  23    Calcium 8.9 - 10.3 mg/dL 9.9  9.8  9.5   Total Protein 6.5 - 8.1 g/dL 8.9  9.2    Total Bilirubin 0.3 - 1.2 mg/dL 0.8  0.6    Alkaline Phos 38 - 126 U/L 76  92    AST 15 - 41 U/L 46  32    ALT 0 - 44 U/L 31  21      Lab Results  Component Value Date   MPROTEIN 2.6 (H) 11/23/2021   MPROTEIN 2.7 (H) 05/25/2021   MPROTEIN 2.5 (H) 10/14/2020   Lab Results  Component Value Date   KPAFRELGTCHN 76.4 (H) 05/31/2022   KPAFRELGTCHN 93.4 (H) 11/23/2021   KPAFRELGTCHN 70.0 (H) 05/25/2021   LAMBDASER 9.7 05/31/2022   LAMBDASER 10.7 11/23/2021   LAMBDASER 11.3 05/25/2021   KAPLAMBRATIO 7.88 (H) 05/31/2022   KAPLAMBRATIO 8.73 (H) 11/23/2021   KAPLAMBRATIO 6.19 (H) 05/25/2021   RADIOGRAPHIC STUDIES: No results found.  ASSESSMENT & PLAN Arletta Bale. 72 y.o. male with medical history significant for IgG kappa monoclonal gammopathy of undetermined significance who presents for a follow up visit.   Monoclonal Gammopathies are a group of medical conditions defined by the presence of a monoclonal protein (an M protein) in the blood or urine. Monoclonal gammopathies include monoclonal gammopathy of unknown significance (MGUS), Monoclonal gammopathies of renal or neurological significance,  smoldering multiple myeloma (SMM), multiple myeloma (MM), AL amyloidosis, and Waldenstrom macroglobulinemia. The goal of the initial workup is to determine which monoclonal gammopathy a patient has. The workup consists of evaluating protein in the serum (with serum protein electrophoresis (SPEP) and serum  free light chains) , evaluating protein in the urine (UPEP), and evaluation of the skeleton (DG Bone Met Survey) to assure no lytic lesions. Baseline bloodwork includes CMP and CBC. If no CRAB criteria or high risk criteria are noted then the diagnosis is MGUS. MGUS must be followed with bloodwork periodically to assure it does not convert to multiple myeloma (occurs to approximately 1% of patients per year). If there are CRAB criteria or high risk features (such as elevated serum free light chain ratio (taking into account renal function), a non IgG M protein, or M protein >1.5) then a bone marrow biopsy must be pursued.    #IgG Kappa Monoclonal Gammopathy of Undetermined Significance -- Prior bone marrow biopsy performed on 04/02/2018 which confirmed diagnosis of MGUS with 9% plasma cells --Steady increase in M protein since time of diagnosis with mild increase in serum free light chains --Patient has mild anemia which is  also been stable with normal kidney function. Plan: --at each visit will order an SPEP, UPEP, SFLC and beta 2 microglobulin --additionally will collect new baseline CBC, CMP, and LDH --recommend a metastatic bone survey to assess for lytic lesions on a yearly basis.  Next scan is due in Jan 2025 --will consider the need for a repeat bone marrow biopsy pending the above results --Labs showed kappa 76.4, lambda 9.7, ratio 7.88,  M protein pending --Labs show white blood cell count 4.1, hemoglobin 1.4, MCV 94.4, and platelets of 152 --RTC in 6 months time.    No orders of the defined types were placed in this encounter.   All questions were answered. The patient knows to call the clinic with any problems, questions or concerns.  A total of more than 30 minutes were spent on this encounter with face-to-face time and non-face-to-face time, including preparing to see the patient, ordering tests and/or medications, counseling the patient and coordination of care as outlined above.    Ulysees Barns, MD Department of Hematology/Oncology [facility] Phone: [phone] Pager: [phone] Email: [email or web address]  06/07/2022 9:09 AM

## 2022-06-09 LAB — UPEP/UIFE/LIGHT CHAINS/TP, 24-HR UR
% BETA, Urine: 28 %
ALPHA 1 URINE: 2.6 %
Albumin, U: 30.8 %
Alpha 2, Urine: 11.1 %
Free Kappa Lt Chains,Ur: 43.02 mg/L (ref 1.17–86.46)
Free Kappa/Lambda Ratio: 20.29 — ABNORMAL HIGH (ref 1.83–14.26)
Free Lambda Lt Chains,Ur: 2.12 mg/L (ref 0.27–15.21)
GAMMA GLOBULIN URINE: 27.4 %
M-SPIKE %, Urine: 8 % — ABNORMAL HIGH
M-Spike, Mg/24 Hr: 10 mg/24 hr — ABNORMAL HIGH
Total Protein, Urine-Ur/day: 124 mg/24 hr (ref 30–150)
Total Protein, Urine: 16.5 mg/dL
Total Volume: 750

## 2022-06-09 LAB — MULTIPLE MYELOMA PANEL, SERUM
Albumin SerPl Elph-Mcnc: 3.8 g/dL (ref 2.9–4.4)
Albumin/Glob SerPl: 0.8 (ref 0.7–1.7)
Alpha 1: 0.3 g/dL (ref 0.0–0.4)
Alpha2 Glob SerPl Elph-Mcnc: 0.9 g/dL (ref 0.4–1.0)
B-Globulin SerPl Elph-Mcnc: 1 g/dL (ref 0.7–1.3)
Gamma Glob SerPl Elph-Mcnc: 2.7 g/dL — ABNORMAL HIGH (ref 0.4–1.8)
Globulin, Total: 4.8 g/dL — ABNORMAL HIGH (ref 2.2–3.9)
IgA: 12 mg/dL — ABNORMAL LOW (ref 61–437)
IgG (Immunoglobin G), Serum: 3497 mg/dL — ABNORMAL HIGH (ref 603–1613)
IgM (Immunoglobulin M), Srm: 24 mg/dL (ref 15–143)
M Protein SerPl Elph-Mcnc: 2.5 g/dL — ABNORMAL HIGH
Total Protein ELP: 8.6 g/dL — ABNORMAL HIGH (ref 6.0–8.5)

## 2022-06-27 DIAGNOSIS — M94211 Chondromalacia, right shoulder: Secondary | ICD-10-CM | POA: Diagnosis not present

## 2022-06-27 DIAGNOSIS — M75111 Incomplete rotator cuff tear or rupture of right shoulder, not specified as traumatic: Secondary | ICD-10-CM | POA: Diagnosis not present

## 2022-06-27 DIAGNOSIS — M67813 Other specified disorders of tendon, right shoulder: Secondary | ICD-10-CM | POA: Diagnosis not present

## 2022-06-27 DIAGNOSIS — M659 Synovitis and tenosynovitis, unspecified: Secondary | ICD-10-CM | POA: Diagnosis not present

## 2022-06-27 DIAGNOSIS — M7541 Impingement syndrome of right shoulder: Secondary | ICD-10-CM | POA: Diagnosis not present

## 2022-06-27 DIAGNOSIS — M24111 Other articular cartilage disorders, right shoulder: Secondary | ICD-10-CM | POA: Diagnosis not present

## 2022-06-27 DIAGNOSIS — M25811 Other specified joint disorders, right shoulder: Secondary | ICD-10-CM | POA: Diagnosis not present

## 2022-06-27 DIAGNOSIS — G8918 Other acute postprocedural pain: Secondary | ICD-10-CM | POA: Diagnosis not present

## 2022-07-05 DIAGNOSIS — X32XXXD Exposure to sunlight, subsequent encounter: Secondary | ICD-10-CM | POA: Diagnosis not present

## 2022-07-05 DIAGNOSIS — D225 Melanocytic nevi of trunk: Secondary | ICD-10-CM | POA: Diagnosis not present

## 2022-07-05 DIAGNOSIS — L820 Inflamed seborrheic keratosis: Secondary | ICD-10-CM | POA: Diagnosis not present

## 2022-07-05 DIAGNOSIS — L570 Actinic keratosis: Secondary | ICD-10-CM | POA: Diagnosis not present

## 2022-07-05 DIAGNOSIS — Z1283 Encounter for screening for malignant neoplasm of skin: Secondary | ICD-10-CM | POA: Diagnosis not present

## 2022-07-20 DIAGNOSIS — M6281 Muscle weakness (generalized): Secondary | ICD-10-CM | POA: Diagnosis not present

## 2022-07-20 DIAGNOSIS — M25611 Stiffness of right shoulder, not elsewhere classified: Secondary | ICD-10-CM | POA: Diagnosis not present

## 2022-07-24 DIAGNOSIS — M25611 Stiffness of right shoulder, not elsewhere classified: Secondary | ICD-10-CM | POA: Diagnosis not present

## 2022-07-24 DIAGNOSIS — M6281 Muscle weakness (generalized): Secondary | ICD-10-CM | POA: Diagnosis not present

## 2022-07-31 DIAGNOSIS — M25611 Stiffness of right shoulder, not elsewhere classified: Secondary | ICD-10-CM | POA: Diagnosis not present

## 2022-07-31 DIAGNOSIS — M6281 Muscle weakness (generalized): Secondary | ICD-10-CM | POA: Diagnosis not present

## 2022-08-04 DIAGNOSIS — M6281 Muscle weakness (generalized): Secondary | ICD-10-CM | POA: Diagnosis not present

## 2022-08-04 DIAGNOSIS — M25611 Stiffness of right shoulder, not elsewhere classified: Secondary | ICD-10-CM | POA: Diagnosis not present

## 2022-08-08 DIAGNOSIS — M6281 Muscle weakness (generalized): Secondary | ICD-10-CM | POA: Diagnosis not present

## 2022-08-08 DIAGNOSIS — M25611 Stiffness of right shoulder, not elsewhere classified: Secondary | ICD-10-CM | POA: Diagnosis not present

## 2022-08-10 DIAGNOSIS — N411 Chronic prostatitis: Secondary | ICD-10-CM | POA: Diagnosis not present

## 2022-08-10 DIAGNOSIS — R972 Elevated prostate specific antigen [PSA]: Secondary | ICD-10-CM | POA: Diagnosis not present

## 2022-08-10 DIAGNOSIS — M6281 Muscle weakness (generalized): Secondary | ICD-10-CM | POA: Diagnosis not present

## 2022-08-10 DIAGNOSIS — M25611 Stiffness of right shoulder, not elsewhere classified: Secondary | ICD-10-CM | POA: Diagnosis not present

## 2022-08-14 DIAGNOSIS — M6281 Muscle weakness (generalized): Secondary | ICD-10-CM | POA: Diagnosis not present

## 2022-08-14 DIAGNOSIS — M25611 Stiffness of right shoulder, not elsewhere classified: Secondary | ICD-10-CM | POA: Diagnosis not present

## 2022-08-15 DIAGNOSIS — M4316 Spondylolisthesis, lumbar region: Secondary | ICD-10-CM | POA: Diagnosis not present

## 2022-08-15 DIAGNOSIS — Z6837 Body mass index (BMI) 37.0-37.9, adult: Secondary | ICD-10-CM | POA: Diagnosis not present

## 2022-08-16 DIAGNOSIS — M6281 Muscle weakness (generalized): Secondary | ICD-10-CM | POA: Diagnosis not present

## 2022-08-16 DIAGNOSIS — M25611 Stiffness of right shoulder, not elsewhere classified: Secondary | ICD-10-CM | POA: Diagnosis not present

## 2022-08-21 DIAGNOSIS — M19011 Primary osteoarthritis, right shoulder: Secondary | ICD-10-CM | POA: Diagnosis not present

## 2022-08-22 DIAGNOSIS — M6281 Muscle weakness (generalized): Secondary | ICD-10-CM | POA: Diagnosis not present

## 2022-08-22 DIAGNOSIS — M25611 Stiffness of right shoulder, not elsewhere classified: Secondary | ICD-10-CM | POA: Diagnosis not present

## 2022-08-24 ENCOUNTER — Ambulatory Visit: Payer: Medicare Other | Admitting: Cardiology

## 2022-08-24 DIAGNOSIS — M6281 Muscle weakness (generalized): Secondary | ICD-10-CM | POA: Diagnosis not present

## 2022-08-24 DIAGNOSIS — M25611 Stiffness of right shoulder, not elsewhere classified: Secondary | ICD-10-CM | POA: Diagnosis not present

## 2022-08-25 DIAGNOSIS — R260 Ataxic gait: Secondary | ICD-10-CM | POA: Diagnosis not present

## 2022-08-25 DIAGNOSIS — H8110 Benign paroxysmal vertigo, unspecified ear: Secondary | ICD-10-CM | POA: Diagnosis not present

## 2022-08-28 DIAGNOSIS — M6281 Muscle weakness (generalized): Secondary | ICD-10-CM | POA: Diagnosis not present

## 2022-08-28 DIAGNOSIS — M25611 Stiffness of right shoulder, not elsewhere classified: Secondary | ICD-10-CM | POA: Diagnosis not present

## 2022-08-29 DIAGNOSIS — M25562 Pain in left knee: Secondary | ICD-10-CM | POA: Diagnosis not present

## 2022-08-30 DIAGNOSIS — R260 Ataxic gait: Secondary | ICD-10-CM | POA: Diagnosis not present

## 2022-08-30 DIAGNOSIS — H8110 Benign paroxysmal vertigo, unspecified ear: Secondary | ICD-10-CM | POA: Diagnosis not present

## 2022-08-31 DIAGNOSIS — M25611 Stiffness of right shoulder, not elsewhere classified: Secondary | ICD-10-CM | POA: Diagnosis not present

## 2022-08-31 DIAGNOSIS — M6281 Muscle weakness (generalized): Secondary | ICD-10-CM | POA: Diagnosis not present

## 2022-09-01 ENCOUNTER — Encounter: Payer: Self-pay | Admitting: Cardiology

## 2022-09-01 ENCOUNTER — Ambulatory Visit: Payer: Medicare Other | Admitting: Cardiology

## 2022-09-01 VITALS — BP 157/75 | HR 68 | Resp 16 | Ht 68.0 in | Wt 236.2 lb

## 2022-09-01 DIAGNOSIS — I10 Essential (primary) hypertension: Secondary | ICD-10-CM | POA: Diagnosis not present

## 2022-09-01 DIAGNOSIS — I2584 Coronary atherosclerosis due to calcified coronary lesion: Secondary | ICD-10-CM | POA: Diagnosis not present

## 2022-09-01 DIAGNOSIS — I700 Atherosclerosis of aorta: Secondary | ICD-10-CM | POA: Diagnosis not present

## 2022-09-01 DIAGNOSIS — I2510 Atherosclerotic heart disease of native coronary artery without angina pectoris: Secondary | ICD-10-CM

## 2022-09-01 DIAGNOSIS — I77810 Thoracic aortic ectasia: Secondary | ICD-10-CM | POA: Diagnosis not present

## 2022-09-01 DIAGNOSIS — R931 Abnormal findings on diagnostic imaging of heart and coronary circulation: Secondary | ICD-10-CM

## 2022-09-01 NOTE — Progress Notes (Signed)
ID:  Walter Bale., DOB June 17, 1950, MRN [id number]  PCP:  Merri Brunette, MD  Cardiologist:  Tessa Lerner, DO, [facility] (established care 10/20/21)  Date: 09/01/22 Last Office Visit: 12/23/2021  Chief Complaint  Patient presents with   Follow-up   Coronary atherosclerosis due to calcified coronary lesion    HPI  Walter Kissam. is a 72 y.o. 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   Patient was referred to practice for evaluation of severe coronary artery calcification.  And since then has undergone appropriate ischemic workup as outlined below.  The focus has been on improving his modifiable cardiovascular risk factors and blood pressure management as his ascending aorta is dilated.  He presents today for follow-up.  Since last office visit he denies anginal chest pain or heart failure symptoms.  The patient states that his home blood pressures are relatively well-controlled around 118/65 mmHg.  His PCP in the past did uptitrate blood pressure medications but those pills had to be discontinued due to lightheaded and dizziness/falls.  Currently he is only on lisinopril/hydrochlorothiazide.  FUNCTIONAL STATUS: Swimming and back therapy.   ALLERGIES: Allergies  Allergen Reactions   Pork-Derived Products Shortness Of Breath   Shellfish Allergy Shortness Of Breath   Lovastatin Other (See Comments)    Bone pain    Penicillins     UNSPECIFIED CHILDHOOD REACTION  Has patient had a PCN reaction causing immediate rash, facial/tongue/throat swelling, SOB or lightheadedness with hypotension: Unknown Has patient had a PCN reaction causing severe rash involving mucus membranes or skin necrosis:  Unknown Has patient had a PCN reaction that required hospitalization: Unknown Has patient had a PCN reaction occurring within the last 10 years: No If all of the above answers are "NO", then may proceed with Cephalosporin use.     MEDICATION LIST PRIOR TO VISIT: Current Meds  Medication Sig   acetaminophen (TYLENOL) 650 MG CR tablet Take 650 mg by mouth every 8 (eight) hours as needed for pain.   aspirin EC 81 MG tablet Take 1 tablet (81 mg total) by mouth daily. Swallow whole.   Cholecalciferol (VITAMIN D) 125 MCG (5000 UT) CAPS Take 5,000 Units by mouth daily.   diclofenac (VOLTAREN) 50 MG EC tablet Take 50 mg by mouth 2 (two) times daily.   famotidine (PEPCID) 20 MG tablet Take 20 mg by mouth daily.   gabapentin (NEURONTIN) 600 MG tablet Take 1 tablet by mouth daily.   HYDROcodone-acetaminophen (NORCO/VICODIN) 5-325 MG tablet Take 1 tablet by mouth every 6 (six) hours as needed.   lisinopril-hydrochlorothiazide (ZESTORETIC) 20-25 MG tablet Take 1 tablet by mouth daily.   metFORMIN (GLUCOPHAGE-XR) 500 MG 24 hr tablet Take 500 mg by mouth daily.   pantoprazole (PROTONIX) 40 MG tablet Take 40 mg by mouth daily.   rosuvastatin (CRESTOR) 20 MG tablet Take 5 mg by mouth daily.     PAST MEDICAL HISTORY: Past Medical History:  Diagnosis Date   AAA (abdominal aortic aneurysm) (HCC)    4.0 cm AAA 08/22/17 U/S   Allergy    Allergic Rhinitis   Anemia    Arthritis    spinal steniosis   Benign prostate hyperplasia    with lower urinary tract symptoms   Carpal tunnel syndrome on right    Cataracts, both  eyes    Chronic back pain    caused by spinal stenosis   Elevated PSA    GERD (gastroesophageal reflux disease)    Hyperlipidemia    Hypertension    Essential.     MGUS (monoclonal gammopathy of unknown significance)    Microhematuria    OA (osteoarthritis) of knee    Pneumonia    Hx -when very young   Pre-diabetes    Seasonal allergies    Spinal stenosis    of lumbosacral  region.  Causes chronic back pain.   Upper abdominal pain     PAST SURGICAL HISTORY: Past Surgical History:  Procedure Laterality Date   ABDOMINAL EXPOSURE N/A 09/05/2017   Procedure: ABDOMINAL EXPOSURE;  Surgeon: Larina Earthly, MD;  Location: [facility] OR;  Service: Vascular;  Laterality: N/A;   ANTERIOR LAT LUMBAR FUSION Left 01/26/2016   Procedure: LEFT SIDED LATERAL INTERBODY FUSION, LUMBAR 3-4 WITH INSTRUMENTATION AND ALLOGRAFT;  Surgeon: Estill Bamberg, MD;  Location: [facility] OR;  Service: Orthopedics;  Laterality: Left;  LEFT SIDED LATERAL INTERBODY FUSION, LUMBAR 3-4 WITH INSTRUMENTATION AND ALLOGRAFT   ANTERIOR LAT LUMBAR FUSION Right 08/14/2018   Procedure: RIGHT LUMBAR two to LUMBAR three LATERAL INTERBODY FUSION WITH INSTRUMENTATION AND ALLOGRAFT;  Surgeon: Estill Bamberg, MD;  Location: [facility] OR;  Service: Orthopedics;  Laterality: Right;   ANTERIOR LATERAL LUMBAR FUSION WITH PERCUTANEOUS SCREW 1 LEVEL N/A 09/05/2017   Procedure: LUMBAR 5 - SACRUM 1 ANTERIOR LUMBAR INTERBODY FUSION WITH INSTRUMENTATION AND ALLOGRAFT;  Surgeon: Estill Bamberg, MD;  Location: [facility] OR;  Service: Orthopedics;  Laterality: N/A;   BACK SURGERY  01/2015   dr. Yevette Edwards. [phone].   CARPAL TUNNEL RELEASE     right    CATARACT EXTRACTION, BILATERAL     COLONOSCOPY     COLONOSCOPY  2010,2016   polyp   ELBOW SURGERY     right ulnar release   ELBOW SURGERY     LEFT CTR   EYE SURGERY     FINGER ARTHROPLASTY Left 04/04/2021   Procedure: LEFT INDEX FINGER IMPLANT ARTHROPLASTY AT METACARPOPHALANGEAL JOINT;  Surgeon: Mack Hook, MD;  Location: [facility];  Service: Orthopedics;  Laterality: Left;  PRE-OP BLOCK,   FINGER ARTHROPLASTY Left 09/16/2021   Procedure: LEFT INDEX FINGER METACARPOPHALANGEAL JOINT REVISION ARTHROPLASTY;  Surgeon: Mack Hook, MD;  Location: [facility];  Service: Orthopedics;  Laterality: Left;   JOINT REPLACEMENT     left- Sept 2016   KNEE ARTHROSCOPY  11/23/2010    Procedure: ARTHROSCOPY KNEE;  Surgeon: Nestor Lewandowsky;  Location: [facility];  Service: Orthopedics;  Laterality: Left;  arthroscopy left knee   KNEE ARTHROSCOPY     left x 2   Knee scar tissue removal Left 2018   LATERAL FUSION LUMBAR SPINE  01./17/2018   polypectomy  2010   SHOULDER ARTHROSCOPY Left    x2   TONSILLECTOMY     TOTAL KNEE ARTHROPLASTY Left 09/21/2014   Procedure: TOTAL KNEE ARTHROPLASTY;  Surgeon: Gean Birchwood, MD;  Location: [facility] OR;  Service: Orthopedics;  Laterality: Left;   ULNAR NERVE TRANSPOSITION Left 04/16/2017   Procedure: LEFT ULNAR NEUROPLASTY AT THE WRIST AND TRANSPOSITION AT THE ELBOW;  Surgeon: Mack Hook, MD;  Location: [facility];  Service: Orthopedics;  Laterality: Left;   WISDOM TOOTH EXTRACTION      FAMILY HISTORY: The patient family history includes Cancer in his mother; Colon cancer (age of onset: 21) in his maternal uncle; Liver disease  in his father.  SOCIAL HISTORY:  The patient  reports that he quit smoking about 41 years ago. His smoking use included cigarettes. He started smoking about 51 years ago. He has a 5 pack-year smoking history. He has never used smokeless tobacco. He reports current alcohol use. He reports that he does not use drugs.  REVIEW OF SYSTEMS: Review of Systems  Cardiovascular:  Negative for chest pain, claudication, dyspnea on exertion, irregular heartbeat, leg swelling, near-syncope, orthopnea, palpitations, paroxysmal nocturnal dyspnea and syncope.  Respiratory:  Negative for shortness of breath.   Hematologic/Lymphatic: Negative for bleeding problem.  Musculoskeletal:  Positive for back pain (several back surgery). Negative for muscle cramps and myalgias.  Neurological:  Negative for dizziness and light-headedness.    PHYSICAL EXAM: Today's Vitals   09/01/22 1043  BP: (!) 157/75  Pulse: 68  Resp: 16  SpO2: 98%  Weight: 236 lb 3.2 oz (107.1 kg)  Height: 5\' 8"  (1.727 m)   Body mass  index is 35.91 kg/m.  Physical Exam  Constitutional: No distress.  Age appropriate, hemodynamically stable.   Neck: No JVD present.  Cardiovascular: Normal rate, regular rhythm, S1 normal, S2 normal and intact distal pulses. Exam reveals no gallop, no S3 and no S4.  No murmur heard. Pulses:      Radial pulses are 2+ on the right side and 2+ on the left side.       Dorsalis pedis pulses are 2+ on the right side and 2+ on the left side.       Posterior tibial pulses are 2+ on the right side and 2+ on the left side.  Pulmonary/Chest: Effort normal and breath sounds normal. No stridor. He has no wheezes. He has no rales.  Abdominal: Soft. Bowel sounds are normal. He exhibits no distension. There is no abdominal tenderness.  Musculoskeletal:        General: No edema.     Cervical back: Neck supple.  Neurological: He is alert and oriented to person, place, and time. He has intact cranial nerves (2-12).  Skin: Skin is warm and moist.   CARDIAC DATABASE: EKG: September 01, 2022: Sinus rhythm, 60 bpm, without underlying ischemia or injury pattern.  Echocardiogram: 11/07/2021 1. Left ventricular ejection fraction, by estimation, is 55 to 60%. The left ventricle has normal function. The left ventricle has no regional wall motion abnormalities. There is moderate left ventricular hypertrophy. Left ventricular diastolic  parameters were normal. 2. Right ventricular systolic function is normal. The right ventricular size is normal. 3. The mitral valve is degenerative. No evidence of mitral valve regurgitation. No evidence of mitral stenosis. 4. The aortic valve is tricuspid. Aortic valve regurgitation is not visualized. Aortic valve sclerosis is present, with no evidence of aortic valve stenosis. 5. Aortic dilatation noted. There is mild dilatation of the aortic root, measuring 39 mm. There is mild dilatation of the ascending aorta, measuring 41 mm.    Stress Testing: Lexiscan Nuclear stress test  11/14/2021: Prominent diaphragmatic attenuation noted inferior wall.  Myocardial perfusion does not reveal ischemia or scar.  Overall LV systolic function is normal without regional wall motion abnormalities. Calculated Stress LV EF: 49%, visually appears normal. Correlate with echocardiogram.  Non-diagnostic ECG stress. The heart rate response was consistent with Regadenoson.  No previous exam available for comparison. Low risk.   Heart Catheterization: None  Coronary artery calcium score: 07/20/2021 Left Main: 0   LAD: 587   LCx: 89.3   RCA: 470   Total Agatston Score:  1455   MESA database percentile: 88   AORTA MEASUREMENTS:   Ascending Aorta: 42 cm   Descending Aorta: 32 cm 1. Total Agatston score of 1455. This places the patient in the 88th percentile for age, gender and race/ethnicity. 2. Aortic atherosclerosis. 3. Mild aneurysmal dilation of the ascending thoracic aorta.  Recommend annual imaging followup by CTA or MRA  [place] AAA Duplex limited: 12/01/2021: Abdominal Aorta: There is evidence of abnormal dilatation of the proximal Abdominal aorta. The largest aortic measurement is 4.0 cm. The largest aortic diameter remains essentially unchanged compared to prior exam.  Previous diameter measurement was 4.0 cm obtained on 03/31/2019.   LABORATORY DATA:    Latest Ref Rng & Units 05/31/2022    8:40 AM 11/23/2021    8:45 AM 06/01/2021    9:35 AM  CBC  WBC 4.0 - 10.5 K/uL 4.1  3.1  2.9   Hemoglobin 13.0 - 17.0 g/dL 16.1  09.6  04.5   Hematocrit 39.0 - 52.0 % 33.7  30.6  32.2   Platelets 150 - 400 K/uL 152  135  131        Latest Ref Rng & Units 05/31/2022    8:40 AM 11/23/2021    8:45 AM 09/14/2021   12:38 PM  CMP  Glucose 70 - 99 mg/dL 409  811  914   BUN 8 - 23 mg/dL 28  27  27    Creatinine 0.61 - 1.24 mg/dL 7.82  9.56  2.13   Sodium 135 - 145 mmol/L 137  137  134   Potassium 3.5 - 5.1 mmol/L 4.4  4.3  4.3   Chloride 98 - 111 mmol/L 105  105  103   CO2 22 -  32 mmol/L 28  27  23    Calcium 8.9 - 10.3 mg/dL 9.9  9.8  9.5   Total Protein 6.5 - 8.1 g/dL 8.9  9.2    Total Bilirubin 0.3 - 1.2 mg/dL 0.8  0.6    Alkaline Phos 38 - 126 U/L 76  92    AST 15 - 41 U/L 46  32    ALT 0 - 44 U/L 31  21      Lipid Panel  No results found for: "CHOL", "TRIG", "HDL", "CHOLHDL", "VLDL", "LDLCALC", "LDLDIRECT", "LABVLDL"  No components found for: "NTPROBNP" No results for input(s): "PROBNP" in the last 8760 hours. No results for input(s): "TSH" in the last 8760 hours.  BMP Recent Labs    09/14/21 1238 11/23/21 0845 05/31/22 0840  NA 134* 137 137  K 4.3 4.3 4.4  CL 103 105 105  CO2 23 27 28   GLUCOSE 162* 132* 162*  BUN 27* 27* 28*  CREATININE 1.10 1.15 1.02  CALCIUM 9.5 9.8 9.9  GFRNONAA >60 >60 >60   HEMOGLOBIN A1C Lab Results  Component Value Date   HGBA1C 6.2 (H) 08/28/2017   MPG 131.24 08/28/2017    IMPRESSION:    ICD-10-CM   1. Coronary atherosclerosis due to calcified coronary lesion  I25.10 EKG 12-Lead   I25.84     2. Agatston CAC score, >400  R93.1     3. Atherosclerosis of aorta (HCC)  I70.0     4. Ascending aorta dilation (HCC)  I77.810 CT CHEST WO CONTRAST    5. Benign hypertension  I10        RECOMMENDATIONS: Walter Sisney. is a 72 y.o. Caucasian male whose past medical history and cardiac risk factors include: Ascending aorta dilatation (42 mm,  07/20/2021), aortic atherosclerosis, hypertension, hyperlipidemia, AAA, severe coronary artery calcification, carpal tunnel right hand, MGUS (per EMR see hematology), spinal stenosis, chronic back pain, former smoker.   Coronary atherosclerosis due to calcified coronary lesion Agatston CAC score, >400 Atherosclerosis of aorta (HCC) Total CAC 1455, 88 percentile Denies anginal chest pain. Continue aspirin and statin therapy. Has undergone appropriate ischemic workup as outlined above. Will have repeat fasting lipid profile with PCP next week.  Ascending aorta dilation  (HCC) Noted on coronary calcium score dated July/2023. Reemphasized the importance of blood pressure management - working w/ PCP. Goal SBP 120-174mmHG Patient states that is currently being managed by PCP but follow-up imaging has not been performed. Will place referral for CT of the chest without contrast to further evaluate for disease progression  Benign hypertension Office blood pressures are not well-controlled. Home blood pressures are better controlled. In the recent past antihypertensive medications were down titrated by other providers and the medical team due to episodes of falls and hypotension.  Re emphasize importance of low-salt diet. Monitor for now  Abdominal aortic aneurysm (AAA) without rupture, unspecified part (HCC) Reemphasized importance of blood pressure management. Follows up with vascular surgery.  We discussed pharmacological options with regards to weight loss given the fact that he has severe CAC and hemoglobin A1c levels.  Patient states he does not want to try Ozempic or other medications in the same drug class due to GI side effects.  FINAL MEDICATION LIST END OF ENCOUNTER: No orders of the defined types were placed in this encounter.   Medications Discontinued During This Encounter  Medication Reason   olmesartan-hydrochlorothiazide (BENICAR HCT) 40-25 MG tablet    traMADol (ULTRAM) 50 MG tablet      Current Outpatient Medications:    acetaminophen (TYLENOL) 650 MG CR tablet, Take 650 mg by mouth every 8 (eight) hours as needed for pain., Disp: , Rfl:    aspirin EC 81 MG tablet, Take 1 tablet (81 mg total) by mouth daily. Swallow whole., Disp: 30 tablet, Rfl: 12   Cholecalciferol (VITAMIN D) 125 MCG (5000 UT) CAPS, Take 5,000 Units by mouth daily., Disp: , Rfl:    diclofenac (VOLTAREN) 50 MG EC tablet, Take 50 mg by mouth 2 (two) times daily., Disp: , Rfl:    famotidine (PEPCID) 20 MG tablet, Take 20 mg by mouth daily., Disp: , Rfl:    gabapentin  (NEURONTIN) 600 MG tablet, Take 1 tablet by mouth daily., Disp: , Rfl:    HYDROcodone-acetaminophen (NORCO/VICODIN) 5-325 MG tablet, Take 1 tablet by mouth every 6 (six) hours as needed., Disp: , Rfl:    lisinopril-hydrochlorothiazide (ZESTORETIC) 20-25 MG tablet, Take 1 tablet by mouth daily., Disp: , Rfl:    metFORMIN (GLUCOPHAGE-XR) 500 MG 24 hr tablet, Take 500 mg by mouth daily., Disp: , Rfl:    pantoprazole (PROTONIX) 40 MG tablet, Take 40 mg by mouth daily., Disp: , Rfl:    rosuvastatin (CRESTOR) 20 MG tablet, Take 5 mg by mouth daily., Disp: , Rfl:   Orders Placed This Encounter  Procedures   CT CHEST WO CONTRAST   EKG 12-Lead    There are no Patient Instructions on file for this visit.   --Continue cardiac medications as reconciled in final medication list. --Return in about 6 months (around 03/04/2023) for Follow up, Coronary artery calcification. or sooner if needed. --Continue follow-up with your primary care physician regarding the management of your other chronic comorbid conditions.  Patient's questions and concerns were addressed to his  satisfaction. He voices understanding of the instructions provided during this encounter.   This note was created using a voice recognition software as a result there may be grammatical errors inadvertently enclosed that do not reflect the nature of this encounter. Every attempt is made to correct such errors.  Jeshurun Oaxaca Maybell, DO, FACC.  Pager:  [phone] Office: [phone]

## 2022-09-05 DIAGNOSIS — M25611 Stiffness of right shoulder, not elsewhere classified: Secondary | ICD-10-CM | POA: Diagnosis not present

## 2022-09-05 DIAGNOSIS — M6281 Muscle weakness (generalized): Secondary | ICD-10-CM | POA: Diagnosis not present

## 2022-09-06 DIAGNOSIS — M6281 Muscle weakness (generalized): Secondary | ICD-10-CM | POA: Diagnosis not present

## 2022-09-06 DIAGNOSIS — M25611 Stiffness of right shoulder, not elsewhere classified: Secondary | ICD-10-CM | POA: Diagnosis not present

## 2022-09-12 DIAGNOSIS — E1122 Type 2 diabetes mellitus with diabetic chronic kidney disease: Secondary | ICD-10-CM | POA: Diagnosis not present

## 2022-09-12 DIAGNOSIS — I10 Essential (primary) hypertension: Secondary | ICD-10-CM | POA: Diagnosis not present

## 2022-09-12 DIAGNOSIS — I2510 Atherosclerotic heart disease of native coronary artery without angina pectoris: Secondary | ICD-10-CM | POA: Diagnosis not present

## 2022-09-12 DIAGNOSIS — E785 Hyperlipidemia, unspecified: Secondary | ICD-10-CM | POA: Diagnosis not present

## 2022-09-13 ENCOUNTER — Ambulatory Visit (HOSPITAL_COMMUNITY)
Admission: RE | Admit: 2022-09-13 | Discharge: 2022-09-13 | Disposition: A | Discharge status: Home / Self Care | Payer: Medicare Other | Source: Ambulatory Visit | Attending: Cardiology | Admitting: Cardiology

## 2022-09-13 DIAGNOSIS — I77810 Thoracic aortic ectasia: Secondary | ICD-10-CM | POA: Insufficient documentation

## 2022-09-13 DIAGNOSIS — M6281 Muscle weakness (generalized): Secondary | ICD-10-CM | POA: Diagnosis not present

## 2022-09-13 DIAGNOSIS — I700 Atherosclerosis of aorta: Secondary | ICD-10-CM | POA: Diagnosis not present

## 2022-09-13 DIAGNOSIS — M25611 Stiffness of right shoulder, not elsewhere classified: Secondary | ICD-10-CM | POA: Diagnosis not present

## 2022-09-14 DIAGNOSIS — E7841 Elevated Lipoprotein(a): Secondary | ICD-10-CM | POA: Diagnosis not present

## 2022-09-14 DIAGNOSIS — E1169 Type 2 diabetes mellitus with other specified complication: Secondary | ICD-10-CM | POA: Diagnosis not present

## 2022-09-14 DIAGNOSIS — I2510 Atherosclerotic heart disease of native coronary artery without angina pectoris: Secondary | ICD-10-CM | POA: Diagnosis not present

## 2022-09-14 DIAGNOSIS — I10 Essential (primary) hypertension: Secondary | ICD-10-CM | POA: Diagnosis not present

## 2022-09-14 DIAGNOSIS — E785 Hyperlipidemia, unspecified: Secondary | ICD-10-CM | POA: Diagnosis not present

## 2022-09-14 DIAGNOSIS — R400 Somnolence: Secondary | ICD-10-CM | POA: Diagnosis not present

## 2022-09-27 NOTE — Progress Notes (Signed)
Pt understands and I sent a copy to PCP

## 2022-10-02 ENCOUNTER — Other Ambulatory Visit: Payer: Self-pay | Admitting: Cardiology

## 2022-10-02 DIAGNOSIS — H6123 Impacted cerumen, bilateral: Secondary | ICD-10-CM | POA: Diagnosis not present

## 2022-10-02 DIAGNOSIS — I77810 Thoracic aortic ectasia: Secondary | ICD-10-CM

## 2022-10-20 DIAGNOSIS — M4316 Spondylolisthesis, lumbar region: Secondary | ICD-10-CM | POA: Diagnosis not present

## 2022-10-23 DIAGNOSIS — M4316 Spondylolisthesis, lumbar region: Secondary | ICD-10-CM | POA: Diagnosis not present

## 2022-11-01 DIAGNOSIS — M4316 Spondylolisthesis, lumbar region: Secondary | ICD-10-CM | POA: Diagnosis not present

## 2022-11-03 DIAGNOSIS — M4316 Spondylolisthesis, lumbar region: Secondary | ICD-10-CM | POA: Diagnosis not present

## 2022-11-07 DIAGNOSIS — M4316 Spondylolisthesis, lumbar region: Secondary | ICD-10-CM | POA: Diagnosis not present

## 2022-11-09 DIAGNOSIS — M4316 Spondylolisthesis, lumbar region: Secondary | ICD-10-CM | POA: Diagnosis not present

## 2022-11-09 DIAGNOSIS — M19049 Primary osteoarthritis, unspecified hand: Secondary | ICD-10-CM | POA: Diagnosis not present

## 2022-11-14 DIAGNOSIS — M4316 Spondylolisthesis, lumbar region: Secondary | ICD-10-CM | POA: Diagnosis not present

## 2022-11-15 DIAGNOSIS — M19049 Primary osteoarthritis, unspecified hand: Secondary | ICD-10-CM | POA: Diagnosis not present

## 2022-11-16 DIAGNOSIS — Z6836 Body mass index (BMI) 36.0-36.9, adult: Secondary | ICD-10-CM | POA: Diagnosis not present

## 2022-11-16 DIAGNOSIS — M4316 Spondylolisthesis, lumbar region: Secondary | ICD-10-CM | POA: Diagnosis not present

## 2022-11-17 DIAGNOSIS — M4316 Spondylolisthesis, lumbar region: Secondary | ICD-10-CM | POA: Diagnosis not present

## 2022-11-21 DIAGNOSIS — M4316 Spondylolisthesis, lumbar region: Secondary | ICD-10-CM | POA: Diagnosis not present

## 2022-11-22 ENCOUNTER — Inpatient Hospital Stay: Payer: Medicare Other | Attending: Internal Medicine

## 2022-11-22 ENCOUNTER — Other Ambulatory Visit: Payer: Self-pay | Admitting: Hematology and Oncology

## 2022-11-22 DIAGNOSIS — Z79899 Other long term (current) drug therapy: Secondary | ICD-10-CM | POA: Insufficient documentation

## 2022-11-22 DIAGNOSIS — D472 Monoclonal gammopathy: Secondary | ICD-10-CM

## 2022-11-22 DIAGNOSIS — Z7982 Long term (current) use of aspirin: Secondary | ICD-10-CM | POA: Insufficient documentation

## 2022-11-22 DIAGNOSIS — D649 Anemia, unspecified: Secondary | ICD-10-CM | POA: Insufficient documentation

## 2022-11-22 DIAGNOSIS — C9000 Multiple myeloma not having achieved remission: Secondary | ICD-10-CM | POA: Insufficient documentation

## 2022-11-22 LAB — CMP (CANCER CENTER ONLY)
ALT: 41 U/L (ref 0–44)
AST: 36 U/L (ref 15–41)
Albumin: 4 g/dL (ref 3.5–5.0)
Alkaline Phosphatase: 75 U/L (ref 38–126)
Anion gap: 3 — ABNORMAL LOW (ref 5–15)
BUN: 38 mg/dL — ABNORMAL HIGH (ref 8–23)
CO2: 28 mmol/L (ref 22–32)
Calcium: 9.7 mg/dL (ref 8.9–10.3)
Chloride: 104 mmol/L (ref 98–111)
Creatinine: 1.28 mg/dL — ABNORMAL HIGH (ref 0.61–1.24)
GFR, Estimated: 59 mL/min — ABNORMAL LOW (ref >60)
Glucose, Bld: 120 mg/dL — ABNORMAL HIGH (ref 70–99)
Potassium: 4.4 mmol/L (ref 3.5–5.1)
Sodium: 135 mmol/L (ref 135–145)
Total Bilirubin: 0.6 mg/dL (ref <1.2)
Total Protein: 8.4 g/dL — ABNORMAL HIGH (ref 6.5–8.1)

## 2022-11-22 LAB — CBC WITH DIFFERENTIAL (CANCER CENTER ONLY)
Abs Immature Granulocytes: 0.01 10*3/uL (ref 0.00–0.07)
Basophils Absolute: 0 10*3/uL (ref 0.0–0.1)
Basophils Relative: 1 %
Eosinophils Absolute: 0.1 10*3/uL (ref 0.0–0.5)
Eosinophils Relative: 3 %
HCT: 32.7 % — ABNORMAL LOW (ref 39.0–52.0)
Hemoglobin: 11 g/dL — ABNORMAL LOW (ref 13.0–17.0)
Immature Granulocytes: 0 %
Lymphocytes Relative: 24 %
Lymphs Abs: 0.8 10*3/uL (ref 0.7–4.0)
MCH: 31.9 pg (ref 26.0–34.0)
MCHC: 33.6 g/dL (ref 30.0–36.0)
MCV: 94.8 fL (ref 80.0–100.0)
Monocytes Absolute: 0.3 10*3/uL (ref 0.1–1.0)
Monocytes Relative: 10 %
Neutro Abs: 2.1 10*3/uL (ref 1.7–7.7)
Neutrophils Relative %: 62 %
Platelet Count: 136 10*3/uL — ABNORMAL LOW (ref 150–400)
RBC: 3.45 MIL/uL — ABNORMAL LOW (ref 4.22–5.81)
RDW: 13.9 % (ref 11.5–15.5)
WBC Count: 3.3 10*3/uL — ABNORMAL LOW (ref 4.0–10.5)
nRBC: 0 % (ref 0.0–0.2)

## 2022-11-22 LAB — LACTATE DEHYDROGENASE: LDH: 132 U/L (ref 98–192)

## 2022-11-23 DIAGNOSIS — M4316 Spondylolisthesis, lumbar region: Secondary | ICD-10-CM | POA: Diagnosis not present

## 2022-11-23 LAB — KAPPA/LAMBDA LIGHT CHAINS
Kappa free light chain: 92.8 mg/L — ABNORMAL HIGH (ref 3.3–19.4)
Kappa, lambda light chain ratio: 10.67 — ABNORMAL HIGH (ref 0.26–1.65)
Lambda free light chains: 8.7 mg/L (ref 5.7–26.3)

## 2022-11-27 LAB — MULTIPLE MYELOMA PANEL, SERUM
Albumin SerPl Elph-Mcnc: 3.8 g/dL (ref 2.9–4.4)
Albumin/Glob SerPl: 1 (ref 0.7–1.7)
Alpha 1: 0.2 g/dL (ref 0.0–0.4)
Alpha2 Glob SerPl Elph-Mcnc: 0.8 g/dL (ref 0.4–1.0)
B-Globulin SerPl Elph-Mcnc: 0.8 g/dL (ref 0.7–1.3)
Gamma Glob SerPl Elph-Mcnc: 2.5 g/dL — ABNORMAL HIGH (ref 0.4–1.8)
Globulin, Total: 4.2 g/dL — ABNORMAL HIGH (ref 2.2–3.9)
IgA: 11 mg/dL — ABNORMAL LOW (ref 61–437)
IgG (Immunoglobin G), Serum: 2741 mg/dL — ABNORMAL HIGH (ref 603–1613)
IgM (Immunoglobulin M), Srm: 20 mg/dL (ref 15–143)
M Protein SerPl Elph-Mcnc: 2.3 g/dL — ABNORMAL HIGH
Total Protein ELP: 8 g/dL (ref 6.0–8.5)

## 2022-11-28 DIAGNOSIS — M4316 Spondylolisthesis, lumbar region: Secondary | ICD-10-CM | POA: Diagnosis not present

## 2022-11-30 ENCOUNTER — Inpatient Hospital Stay (HOSPITAL_BASED_OUTPATIENT_CLINIC_OR_DEPARTMENT_OTHER): Payer: Medicare Other | Admitting: Hematology and Oncology

## 2022-11-30 DIAGNOSIS — D649 Anemia, unspecified: Secondary | ICD-10-CM | POA: Diagnosis not present

## 2022-11-30 DIAGNOSIS — D472 Monoclonal gammopathy: Secondary | ICD-10-CM | POA: Diagnosis not present

## 2022-11-30 DIAGNOSIS — Z7982 Long term (current) use of aspirin: Secondary | ICD-10-CM | POA: Diagnosis not present

## 2022-11-30 DIAGNOSIS — Z79899 Other long term (current) drug therapy: Secondary | ICD-10-CM | POA: Diagnosis not present

## 2022-11-30 DIAGNOSIS — C9000 Multiple myeloma not having achieved remission: Secondary | ICD-10-CM | POA: Diagnosis not present

## 2022-11-30 NOTE — Progress Notes (Signed)
[facility] [phone]   [phone]  PROGRESS NOTE  Patient Care Team: Walter Brunette, MD as PCP - General (Internal Medicine) Walter Birchwood, MD as Consulting Physician (Orthopedic Surgery) Walter Bamberg, MD as Consulting Physician (Orthopedic Surgery) White, Walter Hue, MD (Inactive) as Consulting Physician (Hematology and Oncology) Walter Memos, MD as Consulting Physician (Neurosurgery) Walter Dace, MD as Attending Physician (Urology)  Hematological/Oncological History # IgG Kappa Monoclonal Gammopathy of Undetermined Significance 04/02/2018: underwent bone marrow biopsy, showed 9% plasma cells. 10/21/2020: last visit with Walter White 05/25/2021: Labs showed M protein 2.7, kappa 70, lambda 11.3, and ratio 6.19.  White blood cell count 3.7, hemoglobin 11.4, MCV 90.5, and platelets 137 06/01/2021: establish care with Walter White   Interval History:  Walter White. 72 y.o. male with medical history significant for IgG kappa monoclonal gammopathy of undetermined significance who presents for a follow up visit. The patient's last visit was on 06/07/2022. In the interim since the last visit he has had no major changes in his health.  On exam today Walter White reports he has been well overall and interim since her last visit though he is having some issues with his shoulder.  He reports that he has a tear in his rotator cuff and that they are planning to do a replacement surgery next year.  He reports he is also having chronic issues with his back for which his hydrocodone was increased to 4 times a day.  He is sleeping in his recliner.  He notes that he follows with his spine doctor who knows that there are no other surgical interventions to help with his pain.  He does occasionally have some constipation from his pain medication.  He reports that he sleeps poorly at night and is very tired throughout the day.  He notes his energy levels are very  average.  He has not had any recent infectious symptoms such as runny nose, sore throat, or cough.  He denies any urinary symptoms such as bubbling or foaming of the urine or change in the urine color.  He reports his blood pressure is elevated today because he is having pain and has not taken his blood pressure medications.  He notes that otherwise he has been at his baseline level of health.  He otherwise denies any fevers, chills, sweats, nausea, vomiting or diarrhea.  A full 10 point ROS is listed below.  MEDICAL HISTORY:  Past Medical History:  Diagnosis Date   AAA (abdominal aortic aneurysm) (HCC)    4.0 cm AAA 08/22/17 U/S   Allergy    Allergic Rhinitis   Anemia    Arthritis    spinal steniosis   Benign prostate hyperplasia    with lower urinary tract symptoms   Carpal tunnel syndrome on right    Cataracts, both eyes    Chronic back pain    caused by spinal stenosis   Elevated PSA    GERD (gastroesophageal reflux disease)    Hyperlipidemia    Hypertension    Essential.     MGUS (monoclonal gammopathy of unknown significance)    Microhematuria    OA (osteoarthritis) of knee    Pneumonia    Hx -when very young   Pre-diabetes    Seasonal allergies    Spinal stenosis    of lumbosacral region.  Causes chronic back pain.   Upper abdominal pain     SURGICAL HISTORY: Past Surgical History:  Procedure Laterality Date  ABDOMINAL EXPOSURE N/A 09/05/2017   Procedure: ABDOMINAL EXPOSURE;  Surgeon: Larina Earthly, MD;  Location: [facility] [place] [facility];  Service: Vascular;  Laterality: N/A;   ANTERIOR LAT LUMBAR FUSION Left 01/26/2016   Procedure: LEFT SIDED LATERAL INTERBODY FUSION, LUMBAR 3-4 WITH INSTRUMENTATION AND ALLOGRAFT;  Surgeon: Walter Bamberg, MD;  Location: [facility] OR;  Service: Orthopedics;  Laterality: Left;  LEFT SIDED LATERAL INTERBODY FUSION, LUMBAR 3-4 WITH INSTRUMENTATION AND ALLOGRAFT   ANTERIOR LAT LUMBAR FUSION Right 08/14/2018   Procedure: RIGHT LUMBAR two to LUMBAR three LATERAL  INTERBODY FUSION WITH INSTRUMENTATION AND ALLOGRAFT;  Surgeon: Walter Bamberg, MD;  Location: [facility] OR;  Service: Orthopedics;  Laterality: Right;   ANTERIOR LATERAL LUMBAR FUSION WITH PERCUTANEOUS SCREW 1 LEVEL N/A 09/05/2017   Procedure: LUMBAR 5 - SACRUM 1 ANTERIOR LUMBAR INTERBODY FUSION WITH INSTRUMENTATION AND ALLOGRAFT;  Surgeon: Walter Bamberg, MD;  Location: [facility] OR;  Service: Orthopedics;  Laterality: N/A;   BACK SURGERY  01/2015   dr. Yevette Edwards. [id number].   CARPAL TUNNEL RELEASE     right    CATARACT EXTRACTION, BILATERAL     COLONOSCOPY     COLONOSCOPY  2010,2016   polyp   ELBOW SURGERY     right ulnar release   ELBOW SURGERY     LEFT CTR   EYE SURGERY     FINGER ARTHROPLASTY Left 04/04/2021   Procedure: LEFT INDEX FINGER IMPLANT ARTHROPLASTY AT METACARPOPHALANGEAL JOINT;  Surgeon: Mack Hook, MD;  Location: [facility];  Service: Orthopedics;  Laterality: Left;  PRE-OP BLOCK,   FINGER ARTHROPLASTY Left 09/16/2021   Procedure: LEFT INDEX FINGER METACARPOPHALANGEAL JOINT REVISION ARTHROPLASTY;  Surgeon: Mack Hook, MD;  Location: [facility];  Service: Orthopedics;  Laterality: Left;   JOINT REPLACEMENT     left- Sept 2016   KNEE ARTHROSCOPY  11/23/2010   Procedure: ARTHROSCOPY KNEE;  Surgeon: Nestor Lewandowsky;  Location: [facility];  Service: Orthopedics;  Laterality: Left;  arthroscopy left knee   KNEE ARTHROSCOPY     left x 2   Knee scar tissue removal Left 2018   LATERAL FUSION LUMBAR SPINE  01./17/2018   polypectomy  2010   SHOULDER ARTHROSCOPY Left    x2   TONSILLECTOMY     TOTAL KNEE ARTHROPLASTY Left 09/21/2014   Procedure: TOTAL KNEE ARTHROPLASTY;  Surgeon: Walter Birchwood, MD;  Location: [facility] OR;  Service: Orthopedics;  Laterality: Left;   ULNAR NERVE TRANSPOSITION Left 04/16/2017   Procedure: LEFT ULNAR NEUROPLASTY AT THE WRIST AND TRANSPOSITION AT THE ELBOW;  Surgeon: Mack Hook, MD;  Location: [facility];   Service: Orthopedics;  Laterality: Left;   WISDOM TOOTH EXTRACTION      SOCIAL HISTORY: Social History   Socioeconomic History   Marital status: Married    Spouse name: Aram Beecham    Number of children: 2   Years of education: Some colle   Highest education level: Not on file  Occupational History   Occupation: Retired  Tobacco Use   Smoking status: Former    Current packs/day: 0.00    Average packs/day: 0.5 packs/day for 10.0 years (5.0 ttl pk-yrs)    Types: Cigarettes    Start date: 11/22/1970    Quit date: 11/21/1980    Years since quitting: 42.0   Smokeless tobacco: Never  Vaping Use   Vaping status: Never Used  Substance and Sexual Activity   Alcohol use: Yes    Alcohol/week: 0.0 standard drinks of alcohol    Comment: Very  rare   Drug use: No   Sexual activity: Not Currently  Other Topics Concern   Not on file  Social History Narrative   Lives with wife   Caffeine use: tea every day   Social Determinants of Health   Financial Resource Strain: Not on file  Food Insecurity: Not on file  Transportation Needs: Not on file  Physical Activity: Not on file  Stress: Not on file  Social Connections: Not on file  Intimate Partner Violence: Not on file    FAMILY HISTORY: Family History  Problem Relation Age of Onset   Cancer Mother    Liver disease Father    Colon cancer Maternal Uncle 46   Colon polyps Neg Hx    Esophageal cancer Neg Hx    Rectal cancer Neg Hx    Stomach cancer Neg Hx     ALLERGIES:  is allergic to pork-derived products, shellfish allergy, lovastatin, and penicillins.  MEDICATIONS:  Current Outpatient Medications  Medication Sig Dispense Refill   acetaminophen (TYLENOL) 650 MG CR tablet Take 650 mg by mouth every 8 (eight) hours as needed for pain.     aspirin EC 81 MG tablet Take 1 tablet (81 mg total) by mouth daily. Swallow whole. 30 tablet 12   Cholecalciferol (VITAMIN D) 125 MCG (5000 UT) CAPS Take 5,000 Units by mouth daily.      diclofenac (VOLTAREN) 50 MG EC tablet Take 50 mg by mouth 2 (two) times daily.     famotidine (PEPCID) 20 MG tablet Take 20 mg by mouth daily.     gabapentin (NEURONTIN) 600 MG tablet Take 1 tablet by mouth daily.     HYDROcodone-acetaminophen (NORCO/VICODIN) 5-325 MG tablet Take 1 tablet by mouth every 6 (six) hours as needed.     lisinopril-hydrochlorothiazide (ZESTORETIC) 20-25 MG tablet Take 1 tablet by mouth daily.     metFORMIN (GLUCOPHAGE-XR) 500 MG 24 hr tablet Take 500 mg by mouth daily.     pantoprazole (PROTONIX) 40 MG tablet Take 40 mg by mouth daily.     rosuvastatin (CRESTOR) 20 MG tablet Take 5 mg by mouth daily.     No current facility-administered medications for this visit.    REVIEW OF SYSTEMS:   Constitutional: ( - ) fevers, ( - )  chills , ( - ) night sweats Eyes: ( - ) blurriness of vision, ( - ) double vision, ( - ) watery eyes Ears, nose, mouth, throat, and face: ( - ) mucositis, ( - ) sore throat Respiratory: ( - ) cough, ( - ) dyspnea, ( - ) wheezes Cardiovascular: ( - ) palpitation, ( - ) chest discomfort, ( - ) lower extremity swelling Gastrointestinal:  ( - ) nausea, ( - ) heartburn, ( - ) change in bowel habits Skin: ( - ) abnormal skin rashes Lymphatics: ( - ) new lymphadenopathy, ( - ) easy bruising Neurological: ( - ) numbness, ( - ) tingling, ( - ) new weaknesses Behavioral/Psych: ( - ) mood change, ( - ) new changes  All other systems were reviewed with the patient and are negative.  PHYSICAL EXAMINATION: ECOG PERFORMANCE STATUS: 1 - Symptomatic but completely ambulatory  There were no vitals filed for this visit. There were no vitals filed for this visit.  GENERAL: Well-appearing elderly Caucasian male, alert, no distress and comfortable SKIN: skin color, texture, turgor are normal, no rashes or significant lesions EYES: conjunctiva are pink and non-injected, sclera clear LUNGS: clear to auscultation and percussion with normal  breathing  effort HEART: regular rate & rhythm and no murmurs and no lower extremity edema Musculoskeletal: no cyanosis of digits and no clubbing  PSYCH: alert & oriented x 3, fluent speech NEURO: no focal motor/sensory deficits  LABORATORY DATA:  I have reviewed the data as listed    Latest Ref Rng & Units 11/22/2022    8:42 AM 05/31/2022    8:40 AM 11/23/2021    8:45 AM  CBC  WBC 4.0 - 10.5 K/uL 3.3  4.1  3.1   Hemoglobin 13.0 - 17.0 g/dL 29.5  28.4  13.2   Hematocrit 39.0 - 52.0 % 32.7  33.7  30.6   Platelets 150 - 400 K/uL 136  152  135        Latest Ref Rng & Units 11/22/2022    8:42 AM 05/31/2022    8:40 AM 11/23/2021    8:45 AM  CMP  Glucose 70 - 99 mg/dL 440  102  725   BUN 8 - 23 mg/dL 38  28  27   Creatinine 0.61 - 1.24 mg/dL 3.66  4.40  3.47   Sodium 135 - 145 mmol/L 135  137  137   Potassium 3.5 - 5.1 mmol/L 4.4  4.4  4.3   Chloride 98 - 111 mmol/L 104  105  105   CO2 22 - 32 mmol/L 28  28  27    Calcium 8.9 - 10.3 mg/dL 9.7  9.9  9.8   Total Protein 6.5 - 8.1 g/dL 8.4  8.9  9.2   Total Bilirubin <1.2 mg/dL 0.6  0.8  0.6   Alkaline Phos 38 - 126 U/L 75  76  92   AST 15 - 41 U/L 36  46  32   ALT 0 - 44 U/L 41  31  21     Lab Results  Component Value Date   MPROTEIN 2.3 (H) 11/22/2022   MPROTEIN 2.5 (H) 05/31/2022   MPROTEIN 2.6 (H) 11/23/2021   Lab Results  Component Value Date   KPAFRELGTCHN 92.8 (H) 11/22/2022   KPAFRELGTCHN 76.4 (H) 05/31/2022   KPAFRELGTCHN 93.4 (H) 11/23/2021   LAMBDASER 8.7 11/22/2022   LAMBDASER 9.7 05/31/2022   LAMBDASER 10.7 11/23/2021   KAPLAMBRATIO 10.67 (H) 11/22/2022   KAPLAMBRATIO 20.29 (H) 06/07/2022   KAPLAMBRATIO 7.88 (H) 05/31/2022   RADIOGRAPHIC STUDIES: No results found.  ASSESSMENT & PLAN Walter White. 72 y.o. male with medical history significant for IgG kappa monoclonal gammopathy of undetermined significance who presents for a follow up visit.   Monoclonal Gammopathies are a group of medical conditions  defined by the presence of a monoclonal protein (an M protein) in the blood or urine. Monoclonal gammopathies include monoclonal gammopathy of unknown significance (MGUS), Monoclonal gammopathies of renal or neurological significance,  smoldering multiple myeloma (SMM), multiple myeloma (MM), AL amyloidosis, and Waldenstrom macroglobulinemia. The goal of the initial workup is to determine which monoclonal gammopathy a patient has. The workup consists of evaluating protein in the serum (with serum protein electrophoresis (SPEP) and serum free light chains) , evaluating protein in the urine (UPEP), and evaluation of the skeleton (DG Bone Met Survey) to assure no lytic lesions. Baseline bloodwork includes CMP and CBC. If no CRAB criteria or high risk criteria are noted then the diagnosis is MGUS. MGUS must be followed with bloodwork periodically to assure it does not convert to multiple myeloma (occurs to approximately 1% of patients per year). If there are CRAB criteria or high risk  features (such as elevated serum free light chain ratio (taking into account renal function), a non IgG M protein, or M protein >1.5) then a bone marrow biopsy must be pursued.    #IgG Kappa Monoclonal Gammopathy of Undetermined Significance -- Prior bone marrow biopsy performed on 04/02/2018 which confirmed diagnosis of MGUS with 9% plasma cells --Steady increase in M protein since time of diagnosis with mild increase in serum free light chains --Patient has mild anemia which is also been stable with normal kidney function. Plan: --at each visit will order an SPEP, UPEP, SFLC and beta 2 microglobulin --additionally will collect new baseline CBC, CMP, and LDH --recommend a metastatic bone survey to assess for lytic lesions every 1-2 years.  --will consider the need for a repeat bone marrow biopsy pending the above results --Labs showed kappa 92.8, lambda 8.7, ratio 10.67, M protein 2.3 --Labs show white blood cell count 3.3, Hgb  11.0, MCV 94.8, Plt 136 --RTC in 6 months time.   No orders of the defined types were placed in this encounter.  All questions were answered. The patient knows to call the clinic with any problems, questions or concerns.  A total of more than 25 minutes were spent on this encounter with face-to-face time and non-face-to-face time, including preparing to see the patient, ordering tests and/or medications, counseling the patient and coordination of care as outlined above.   Ulysees Barns, MD Department of Hematology/Oncology [facility] Phone: [phone] Pager: [phone] Email: [email or web address]  11/30/2022 9:15 AM

## 2022-12-01 DIAGNOSIS — M4316 Spondylolisthesis, lumbar region: Secondary | ICD-10-CM | POA: Diagnosis not present

## 2022-12-04 ENCOUNTER — Other Ambulatory Visit: Payer: Self-pay | Admitting: *Deleted

## 2022-12-04 DIAGNOSIS — I714 Abdominal aortic aneurysm, without rupture, unspecified: Secondary | ICD-10-CM

## 2022-12-06 DIAGNOSIS — M4316 Spondylolisthesis, lumbar region: Secondary | ICD-10-CM | POA: Diagnosis not present

## 2022-12-11 DIAGNOSIS — M4316 Spondylolisthesis, lumbar region: Secondary | ICD-10-CM | POA: Diagnosis not present

## 2022-12-12 DIAGNOSIS — M19049 Primary osteoarthritis, unspecified hand: Secondary | ICD-10-CM | POA: Diagnosis not present

## 2022-12-12 DIAGNOSIS — M25522 Pain in left elbow: Secondary | ICD-10-CM | POA: Diagnosis not present

## 2022-12-13 DIAGNOSIS — M4316 Spondylolisthesis, lumbar region: Secondary | ICD-10-CM | POA: Diagnosis not present

## 2022-12-19 DIAGNOSIS — E785 Hyperlipidemia, unspecified: Secondary | ICD-10-CM | POA: Diagnosis not present

## 2022-12-19 DIAGNOSIS — R7303 Prediabetes: Secondary | ICD-10-CM | POA: Diagnosis not present

## 2022-12-19 DIAGNOSIS — I10 Essential (primary) hypertension: Secondary | ICD-10-CM | POA: Diagnosis not present

## 2022-12-19 NOTE — Progress Notes (Unsigned)
HISTORY AND PHYSICAL     CC:  follow up. Requesting Provider:  Merri Brunette, MD  HPI: This is a 72 y.o. male who is here today for follow up for AAA.  This was originally found in 2019 when he was undergoing evaluation for abdominal pain.    Pt was last seen 12/07/2021 and at that time, he did not have any new or changing back or abdominal pain.  He has long hx of chronic lower back pain with hx of back surgery x 5.  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  His AAA had previously measured 4.0cm but at that visit, it was 3.1cm.   The pt returns today for follow up.  He denies any new back or abdominal pain.  He denies any cramping in his legs with walking.  He does have some neuropathy that he takes gabapentin for.  He has hx of chronic back pain and his hydrocodone has been increased to qid.   The pt is on a statin for cholesterol management.    The pt is on an aspirin.    Other AC:  none The pt is on ACEI, diuretic for hypertension.  The pt is  on medication diabetes. Tobacco hx:  former  Pt does have family hx of AAA with an uncle.   Past Medical History:  Diagnosis Date   AAA (abdominal aortic aneurysm) (HCC)    4.0 cm AAA 08/22/17 U/S   Allergy    Allergic Rhinitis   Anemia    Arthritis    spinal steniosis   Benign prostate hyperplasia    with lower urinary tract symptoms   Carpal tunnel syndrome on right    Cataracts, both eyes    Chronic back pain    caused by spinal stenosis   Elevated PSA    GERD (gastroesophageal reflux disease)    Hyperlipidemia    Hypertension    Essential.     MGUS (monoclonal gammopathy of unknown significance)    Microhematuria    OA (osteoarthritis) of knee    Pneumonia    Hx -when very young   Pre-diabetes    Seasonal allergies    Spinal stenosis    of lumbosacral region.  Causes chronic back pain.    Upper abdominal pain     Past Surgical History:  Procedure Laterality Date   ABDOMINAL EXPOSURE N/A 09/05/2017   Procedure: ABDOMINAL EXPOSURE;  Surgeon: Larina Earthly, MD;  Location: [facility] OR;  Service: Vascular;  Laterality: N/A;   ANTERIOR LAT LUMBAR FUSION Left 01/26/2016   Procedure: LEFT SIDED LATERAL INTERBODY FUSION, LUMBAR 3-4 WITH INSTRUMENTATION AND ALLOGRAFT;  Surgeon: Estill Bamberg, MD;  Location: [facility] OR;  Service: Orthopedics;  Laterality: Left;  LEFT SIDED LATERAL INTERBODY FUSION, LUMBAR 3-4 WITH INSTRUMENTATION AND ALLOGRAFT   ANTERIOR LAT LUMBAR FUSION Right 08/14/2018   Procedure: RIGHT LUMBAR two to LUMBAR three LATERAL INTERBODY FUSION WITH INSTRUMENTATION AND ALLOGRAFT;  Surgeon: Estill Bamberg, MD;  Location: [facility] OR;  Service: Orthopedics;  Laterality: Right;   ANTERIOR LATERAL LUMBAR FUSION WITH PERCUTANEOUS SCREW 1 LEVEL N/A 09/05/2017   Procedure: LUMBAR 5 - SACRUM 1 ANTERIOR LUMBAR INTERBODY FUSION WITH INSTRUMENTATION AND ALLOGRAFT;  Surgeon: Estill Bamberg, MD;  Location: [facility] OR;  Service: Orthopedics;  Laterality: N/A;   BACK SURGERY  01/2015   dr. Yevette Edwards. [phone].   CARPAL TUNNEL  RELEASE     right    CATARACT EXTRACTION, BILATERAL     COLONOSCOPY     COLONOSCOPY  2010,2016   polyp   ELBOW SURGERY     right ulnar release   ELBOW SURGERY     LEFT CTR   EYE SURGERY     FINGER ARTHROPLASTY Left 04/04/2021   Procedure: LEFT INDEX FINGER IMPLANT ARTHROPLASTY AT METACARPOPHALANGEAL JOINT;  Surgeon: Mack Hook, MD;  Location: [facility];  Service: Orthopedics;  Laterality: Left;  PRE-OP BLOCK,   FINGER ARTHROPLASTY Left 09/16/2021   Procedure: LEFT INDEX FINGER METACARPOPHALANGEAL JOINT REVISION ARTHROPLASTY;  Surgeon: Mack Hook, MD;  Location: [facility];  Service: Orthopedics;  Laterality: Left;   JOINT REPLACEMENT     left- Sept 2016   KNEE ARTHROSCOPY  11/23/2010   Procedure: ARTHROSCOPY KNEE;  Surgeon: Nestor Lewandowsky;   Location: [facility];  Service: Orthopedics;  Laterality: Left;  arthroscopy left knee   KNEE ARTHROSCOPY     left x 2   Knee scar tissue removal Left 2018   LATERAL FUSION LUMBAR SPINE  01./17/2018   polypectomy  2010   SHOULDER ARTHROSCOPY Left    x2   TONSILLECTOMY     TOTAL KNEE ARTHROPLASTY Left 09/21/2014   Procedure: TOTAL KNEE ARTHROPLASTY;  Surgeon: Gean Birchwood, MD;  Location: [facility] OR;  Service: Orthopedics;  Laterality: Left;   ULNAR NERVE TRANSPOSITION Left 04/16/2017   Procedure: LEFT ULNAR NEUROPLASTY AT THE WRIST AND TRANSPOSITION AT THE ELBOW;  Surgeon: Mack Hook, MD;  Location: [facility];  Service: Orthopedics;  Laterality: Left;   WISDOM TOOTH EXTRACTION      Allergies  Allergen Reactions   Pork-Derived Products Shortness Of Breath   Shellfish Allergy Shortness Of Breath   Lovastatin Other (See Comments)    Bone pain    Penicillins     UNSPECIFIED CHILDHOOD REACTION  Has patient had a PCN reaction causing immediate rash, facial/tongue/throat swelling, SOB or lightheadedness with hypotension: Unknown Has patient had a PCN reaction causing severe rash involving mucus membranes or skin necrosis: Unknown Has patient had a PCN reaction that required hospitalization: Unknown Has patient had a PCN reaction occurring within the last 10 years: No If all of the above answers are "NO", then may proceed with Cephalosporin use.     Current Outpatient Medications  Medication Sig Dispense Refill   acetaminophen (TYLENOL) 650 MG CR tablet Take 650 mg by mouth every 8 (eight) hours as needed for pain.     aspirin EC 81 MG tablet Take 1 tablet (81 mg total) by mouth daily. Swallow whole. 30 tablet 12   Cholecalciferol (VITAMIN D) 125 MCG (5000 UT) CAPS Take 5,000 Units by mouth daily.     diclofenac (VOLTAREN) 50 MG EC tablet Take 50 mg by mouth 2 (two) times daily.     famotidine (PEPCID) 20 MG tablet Take 20 mg by mouth daily.     gabapentin  (NEURONTIN) 600 MG tablet Take 1 tablet by mouth daily.     HYDROcodone-acetaminophen (NORCO/VICODIN) 5-325 MG tablet Take 1 tablet by mouth every 6 (six) hours as needed.     lisinopril-hydrochlorothiazide (ZESTORETIC) 20-25 MG tablet Take 1 tablet by mouth daily.     metFORMIN (GLUCOPHAGE-XR) 500 MG 24 hr tablet Take 500 mg by mouth daily.     pantoprazole (PROTONIX) 40 MG tablet Take 40 mg by mouth daily.     rosuvastatin (CRESTOR) 20 MG tablet Take  5 mg by mouth daily.     No current facility-administered medications for this visit.    Family History  Problem Relation Age of Onset   Cancer Mother    Liver disease Father    Colon cancer Maternal Uncle 66   Colon polyps Neg Hx    Esophageal cancer Neg Hx    Rectal cancer Neg Hx    Stomach cancer Neg Hx     Social History   Socioeconomic History   Marital status: Married    Spouse name: Aram Beecham    Number of children: 2   Years of education: Some colle   Highest education level: Not on file  Occupational History   Occupation: Retired  Tobacco Use   Smoking status: Former    Current packs/day: 0.00    Average packs/day: 0.5 packs/day for 10.0 years (5.0 ttl pk-yrs)    Types: Cigarettes    Start date: 11/22/1970    Quit date: 11/21/1980    Years since quitting: 42.1   Smokeless tobacco: Never  Vaping Use   Vaping status: Never Used  Substance and Sexual Activity   Alcohol use: Yes    Alcohol/week: 0.0 standard drinks of alcohol    Comment: Very rare   Drug use: No   Sexual activity: Not Currently  Other Topics Concern   Not on file  Social History Narrative   Lives with wife   Caffeine use: tea every day   Social Determinants of Health   Financial Resource Strain: Not on file  Food Insecurity: Not on file  Transportation Needs: Not on file  Physical Activity: Not on file  Stress: Not on file  Social Connections: Not on file  Intimate Partner Violence: Not on file     REVIEW OF SYSTEMS:   [X]  denotes  positive finding, [ ]  denotes negative finding Cardiac  Comments:  Chest pain or chest pressure:    Shortness of breath upon exertion:    Short of breath when lying flat:    Irregular heart rhythm:        Vascular    Pain in calf, thigh, or hip brought on by ambulation:    Pain in feet at night that wakes you up from your sleep:     Blood clot in your veins:    Leg swelling:         Pulmonary    Oxygen at home:    Productive cough:     Wheezing:         Neurologic    Sudden weakness in arms or legs:     Sudden numbness in arms or legs:     Sudden onset of difficulty speaking or slurred speech:    Temporary loss of vision in one eye:     Problems with dizziness:         Gastrointestinal    Blood in stool:     Vomited blood:         Genitourinary    Burning when urinating:     Blood in urine:        Psychiatric    Major depression:         Hematologic    Bleeding problems:    Problems with blood clotting too easily:        Skin    Rashes or ulcers:        Constitutional    Fever or chills:      PHYSICAL EXAMINATION:  Today's Vitals  12/20/22 0905  BP: 136/82  Pulse: 62  Temp: 98.2 F (36.8 C)  TempSrc: Temporal  SpO2: 95%  Weight: 240 lb (108.9 kg)  Height: 5\' 8"  (1.727 m)   Body mass index is 36.49 kg/m.   General:  WDWN in NAD; vital signs documented above Gait: Not observed HENT: WNL, normocephalic Pulmonary: normal non-labored breathing  Cardiac: regular HR, without carotid bruits Abdomen: soft, NT; aortic pulse is not palpable Skin: without rashes Vascular Exam/Pulses:  Right Left  Radial 2+ (normal) Brace in place  Popliteal Unable to palpate Unable to palpate  DP 2+ (normal) 2+ (normal)  PT 2+ (normal) 2+ (normal)   Extremities: without ischemic changes, without Gangrene , without cellulitis; without open wounds Musculoskeletal: no muscle wasting or atrophy  Neurologic: A&O X 3;  No focal weakness or paresthesias are  detected Psychiatric:  The pt has Normal affect.   Non-Invasive Vascular Imaging:   AAA Arterial duplex on 12/20/2022: Abdominal Aorta Findings:  +-----------+-------+----------+----------+---------+--------+--------+  Location  AP (cm)Trans (cm)PSV (cm/s)Waveform ThrombusComments  +-----------+-------+----------+----------+---------+--------+--------+  Proximal  3.40   3.30      34                                   +-----------+-------+----------+----------+---------+--------+--------+  Mid       2.30             83        biphasic                   +-----------+-------+----------+----------+---------+--------+--------+  Distal    2.00   2.00      104       biphasic                   +-----------+-------+----------+----------+---------+--------+--------+  RT CIA Prox1.3    1.2       122       triphasic                  +-----------+-------+----------+----------+---------+--------+--------+  LT CIA Prox1.2    1.0       108       triphasic                  +-----------+-------+----------+----------+---------+--------+--------+   Summary:  Abdominal Aorta: There is evidence of abnormal dilatation of the proximal  Abdominal aorta. Previous diameter measurement was 3.1 cm obtained on  12/07/2021. Exam of 12/01/2020 revealed the proximal aorta to measure 4.00 cm.   Previous AAA arterial duplex on 12/07/2021: Abdominal Aorta Findings:  +--------+-------+----------+----------+--------+--------+--------+  LocationAP (cm)Trans (cm)PSV (cm/s)WaveformThrombusComments  +--------+-------+----------+----------+--------+--------+--------+  Proximal3.11                                                +--------+-------+----------+----------+--------+--------+--------+  Mid    2.76                                                 +--------+-------+----------+----------+--------+--------+--------+  Distal 1.97   2.17                                           +--------+-------+----------+----------+--------+--------+--------+  Visualization of the Proximal Abdominal Aorta, Mid Abdominal Aorta, Distal Abdominal Aorta, Right CIA Proximal artery and Left CIA Proximal artery was limited.   Summary:  Abdominal Aorta: There is evidence of abnormal dilatation of the proximal Abdominal aorta. Previous diameter measurement was 4.0 cm obtained on 12/01/2020.  Today's exam is based on limited visualization. Largest diameter obtained is 3.11 cm.    ASSESSMENT/PLAN:: 72 y.o. male here for follow up for AAA.  This was originally found in 2019 when he was undergoing evaluation for abdominal pain.    -pt doing well without new back or abdominal pain.  His duplex today reveals 3.4 cm AAA that is slightly increased from 3.1 cm, however, prior duplex revealed 4.0 cm AAA in 2022. -continue statin and asa -pt will f/u in one year with AAA duplex and popliteal duplex BLE. -discussed if he develops severe sudden onset of abdominal or back pain to call 911.   -discussed that his children should get screened in the future.     Doreatha Massed, [facility] [phone]  Clinic MD:   Randie Heinz

## 2022-12-20 ENCOUNTER — Ambulatory Visit (INDEPENDENT_AMBULATORY_CARE_PROVIDER_SITE_OTHER): Payer: Medicare Other | Admitting: Physician Assistant

## 2022-12-20 ENCOUNTER — Ambulatory Visit (HOSPITAL_COMMUNITY)
Admission: RE | Admit: 2022-12-20 | Discharge: 2022-12-20 | Disposition: A | Discharge status: Home / Self Care | Payer: Medicare Other | Source: Ambulatory Visit | Attending: Vascular Surgery | Admitting: Vascular Surgery

## 2022-12-20 VITALS — BP 136/82 | HR 62 | Temp 98.2°F | Ht 68.0 in | Wt 240.0 lb

## 2022-12-20 DIAGNOSIS — M4316 Spondylolisthesis, lumbar region: Secondary | ICD-10-CM | POA: Diagnosis not present

## 2022-12-20 DIAGNOSIS — I714 Abdominal aortic aneurysm, without rupture, unspecified: Secondary | ICD-10-CM | POA: Diagnosis not present

## 2022-12-21 ENCOUNTER — Other Ambulatory Visit: Payer: Self-pay

## 2022-12-21 DIAGNOSIS — I714 Abdominal aortic aneurysm, without rupture, unspecified: Secondary | ICD-10-CM

## 2022-12-22 DIAGNOSIS — M4316 Spondylolisthesis, lumbar region: Secondary | ICD-10-CM | POA: Diagnosis not present

## 2022-12-26 DIAGNOSIS — E7841 Elevated Lipoprotein(a): Secondary | ICD-10-CM | POA: Diagnosis not present

## 2022-12-26 DIAGNOSIS — M4316 Spondylolisthesis, lumbar region: Secondary | ICD-10-CM | POA: Diagnosis not present

## 2022-12-26 DIAGNOSIS — I2510 Atherosclerotic heart disease of native coronary artery without angina pectoris: Secondary | ICD-10-CM | POA: Diagnosis not present

## 2022-12-26 DIAGNOSIS — E785 Hyperlipidemia, unspecified: Secondary | ICD-10-CM | POA: Diagnosis not present

## 2022-12-26 DIAGNOSIS — R400 Somnolence: Secondary | ICD-10-CM | POA: Diagnosis not present

## 2022-12-26 DIAGNOSIS — I10 Essential (primary) hypertension: Secondary | ICD-10-CM | POA: Diagnosis not present

## 2022-12-26 DIAGNOSIS — E1169 Type 2 diabetes mellitus with other specified complication: Secondary | ICD-10-CM | POA: Diagnosis not present

## 2022-12-28 DIAGNOSIS — M4316 Spondylolisthesis, lumbar region: Secondary | ICD-10-CM | POA: Diagnosis not present

## 2023-01-01 ENCOUNTER — Other Ambulatory Visit: Payer: Self-pay | Admitting: Cardiology

## 2023-01-01 DIAGNOSIS — I2510 Atherosclerotic heart disease of native coronary artery without angina pectoris: Secondary | ICD-10-CM

## 2023-01-01 DIAGNOSIS — R931 Abnormal findings on diagnostic imaging of heart and coronary circulation: Secondary | ICD-10-CM

## 2023-01-08 ENCOUNTER — Other Ambulatory Visit: Payer: Self-pay | Admitting: Cardiology

## 2023-01-08 DIAGNOSIS — Z961 Presence of intraocular lens: Secondary | ICD-10-CM | POA: Diagnosis not present

## 2023-01-08 DIAGNOSIS — R931 Abnormal findings on diagnostic imaging of heart and coronary circulation: Secondary | ICD-10-CM

## 2023-01-08 DIAGNOSIS — I2510 Atherosclerotic heart disease of native coronary artery without angina pectoris: Secondary | ICD-10-CM

## 2023-01-08 DIAGNOSIS — H0102A Squamous blepharitis right eye, upper and lower eyelids: Secondary | ICD-10-CM | POA: Diagnosis not present

## 2023-01-08 DIAGNOSIS — H33312 Horseshoe tear of retina without detachment, left eye: Secondary | ICD-10-CM | POA: Diagnosis not present

## 2023-01-11 DIAGNOSIS — M4316 Spondylolisthesis, lumbar region: Secondary | ICD-10-CM | POA: Diagnosis not present

## 2023-01-16 DIAGNOSIS — M4316 Spondylolisthesis, lumbar region: Secondary | ICD-10-CM | POA: Diagnosis not present

## 2023-01-18 DIAGNOSIS — M4316 Spondylolisthesis, lumbar region: Secondary | ICD-10-CM | POA: Diagnosis not present

## 2023-01-22 DIAGNOSIS — M25511 Pain in right shoulder: Secondary | ICD-10-CM | POA: Diagnosis not present

## 2023-01-23 DIAGNOSIS — M4316 Spondylolisthesis, lumbar region: Secondary | ICD-10-CM | POA: Diagnosis not present

## 2023-01-25 DIAGNOSIS — M4316 Spondylolisthesis, lumbar region: Secondary | ICD-10-CM | POA: Diagnosis not present

## 2023-01-25 DIAGNOSIS — M19049 Primary osteoarthritis, unspecified hand: Secondary | ICD-10-CM | POA: Diagnosis not present

## 2023-01-30 DIAGNOSIS — M4316 Spondylolisthesis, lumbar region: Secondary | ICD-10-CM | POA: Diagnosis not present

## 2023-02-08 DIAGNOSIS — M4316 Spondylolisthesis, lumbar region: Secondary | ICD-10-CM | POA: Diagnosis not present

## 2023-02-14 DIAGNOSIS — M4316 Spondylolisthesis, lumbar region: Secondary | ICD-10-CM | POA: Diagnosis not present

## 2023-02-20 DIAGNOSIS — M4316 Spondylolisthesis, lumbar region: Secondary | ICD-10-CM | POA: Diagnosis not present

## 2023-02-22 DIAGNOSIS — M4316 Spondylolisthesis, lumbar region: Secondary | ICD-10-CM | POA: Diagnosis not present

## 2023-02-23 ENCOUNTER — Ambulatory Visit: Payer: Medicare Other | Admitting: Cardiology

## 2023-02-27 DIAGNOSIS — M4316 Spondylolisthesis, lumbar region: Secondary | ICD-10-CM | POA: Diagnosis not present

## 2023-03-01 DIAGNOSIS — M4316 Spondylolisthesis, lumbar region: Secondary | ICD-10-CM | POA: Diagnosis not present

## 2023-03-06 DIAGNOSIS — M4316 Spondylolisthesis, lumbar region: Secondary | ICD-10-CM | POA: Diagnosis not present

## 2023-03-08 DIAGNOSIS — M4316 Spondylolisthesis, lumbar region: Secondary | ICD-10-CM | POA: Diagnosis not present

## 2023-03-13 DIAGNOSIS — M4316 Spondylolisthesis, lumbar region: Secondary | ICD-10-CM | POA: Diagnosis not present

## 2023-03-14 ENCOUNTER — Ambulatory Visit: Payer: Medicare Other | Attending: Cardiology | Admitting: Cardiology

## 2023-03-14 ENCOUNTER — Encounter: Payer: Self-pay | Admitting: Cardiology

## 2023-03-14 VITALS — BP 138/72 | HR 63 | Resp 16 | Ht 68.0 in | Wt 241.6 lb

## 2023-03-14 DIAGNOSIS — I714 Abdominal aortic aneurysm, without rupture, unspecified: Secondary | ICD-10-CM | POA: Diagnosis not present

## 2023-03-14 DIAGNOSIS — I10 Essential (primary) hypertension: Secondary | ICD-10-CM | POA: Diagnosis not present

## 2023-03-14 DIAGNOSIS — I2510 Atherosclerotic heart disease of native coronary artery without angina pectoris: Secondary | ICD-10-CM | POA: Insufficient documentation

## 2023-03-14 DIAGNOSIS — I700 Atherosclerosis of aorta: Secondary | ICD-10-CM | POA: Insufficient documentation

## 2023-03-14 DIAGNOSIS — R931 Abnormal findings on diagnostic imaging of heart and coronary circulation: Secondary | ICD-10-CM | POA: Insufficient documentation

## 2023-03-14 DIAGNOSIS — I2584 Coronary atherosclerosis due to calcified coronary lesion: Secondary | ICD-10-CM | POA: Diagnosis not present

## 2023-03-14 DIAGNOSIS — E785 Hyperlipidemia, unspecified: Secondary | ICD-10-CM | POA: Diagnosis not present

## 2023-03-14 DIAGNOSIS — I77810 Thoracic aortic ectasia: Secondary | ICD-10-CM | POA: Diagnosis not present

## 2023-03-14 NOTE — Patient Instructions (Signed)
 Medication Instructions:  Referral to Pharm D for Repatha   *If you need a refill on your cardiac medications before your next appointment, please call your pharmacy*   Testing/Procedures: CT CHEST W/O CONTRAST IN SEPTEMBER 2025   Follow-Up: At [facility], you and your health needs are our priority.  As part of our continuing mission to provide you with exceptional heart care, we have created designated Provider Care Teams.  These Care Teams include your primary Cardiologist (physician) and Advanced Practice Providers (APPs -  Physician Assistants and Nurse Practitioners) who all work together to provide you with the care you need, when you need it.    Your next appointment:   7 month(s)  Provider:   Tessa Lerner, DO     Other Instructions     1st Floor: - Lobby - Registration  - Pharmacy  - Lab - Cafe  2nd Floor: - PV Lab - Diagnostic Testing (echo, CT, nuclear med)  3rd Floor: - Vacant  4th Floor: - TCTS (cardiothoracic surgery) - AFib Clinic - Structural Heart Clinic - Vascular Surgery  - Vascular Ultrasound  5th Floor: - [facility] (general and EP) - Clinical Pharmacy for coumadin, hypertension, lipid, weight-loss medications, and med management appointments    Valet parking services will be available as well.

## 2023-03-14 NOTE — Progress Notes (Signed)
 Cardiology Office Note:  .   Date:  03/14/2023  ID:  Walter Bale., DOB [birth year], MRN [id number] PCP:  Merri Brunette, MD  Former Cardiology Providers: NA [facility] Providers Cardiologist:  Tessa Lerner, DO , [facility] (established care 10/20/2021) Electrophysiologist:  None  Click to update primary MD,subspecialty MD or APP then REFRESH:1}    Chief Complaint  Patient presents with   Coronary atherosclerosis due to calcified coronary lesion   Follow-up    History of Present Illness: .   Walter Bale. is a 73 y.o. 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   Referred to practice for evaluation of severe coronary artery calcification.  He has undergone appropriate cardiovascular workup as outlined below.  He been focusing on improving his modifiable cardiovascular risk factors and he presents today for 57-month follow-up.  Over last 6 months he is doing well from a cardiovascular standpoint.  Denies anginal chest pain or heart failure symptoms.  Overall functional capacity remains relatively stable but limited occasions.  Home blood pressures are ranging between 120-130 mmHg patient had a CT of the chest without contrast in September 2024 which noted stable dimensions of the ascending aorta.  At times he does feel lightheaded and dizziness likely secondary to pain medications.  Patient states that he is likely going to need to uptitrate his pain medications.  He was also noted to have incidental findings of pulmonary nodules which were reported to be stable.  He does have history of smoking and therefore this will need to be followed up.  I have asked him to assess further with PCP.   Patient agrees.  Outside labs from December 2024 independently reviewed via [facility] database.  LDL levels are still not at goal despite severe CAC and aortic atherosclerosis.  Currently on rosuvastatin 5 mg p.o. daily.   Review of Systems: .   Review of Systems  Cardiovascular:  Negative for chest pain, claudication, irregular heartbeat, leg swelling, near-syncope, orthopnea, palpitations, paroxysmal nocturnal dyspnea and syncope.  Respiratory:  Negative for shortness of breath.   Hematologic/Lymphatic: Negative for bleeding problem.  Musculoskeletal:  Positive for back pain.  Neurological:  Positive for dizziness (likely due to pain meds - per patient.) and light-headedness (likely due to pain meds - per patient.).    Studies Reviewed:   EKG: EKG Interpretation Date/Time:  Wednesday March 14 2023 08:50:30 EST Ventricular Rate:  77 PR Interval:  174 QRS Duration:  96 QT Interval:  372 QTC Calculation: 420 R Axis:   -39  Text Interpretation: Normal sinus rhythm Left axis deviation Possible Anterolateral infarct , age undetermined When compared with ECG of 30-Mar-2021 10:08, Borderline criteria for Anterior infarct and anterolateral infarct are now Present Confirmed by Tessa Lerner [phone]) on 03/14/2023 9:00:57 AM  Echocardiogram: 11/07/2021 1. Left ventricular ejection fraction, by estimation, is 55 to 60%. The left ventricle has normal function. The left ventricle has no regional wall motion abnormalities. There is moderate left ventricular hypertrophy. Left ventricular diastolic  parameters were normal. 2. Right ventricular systolic function is normal. The right ventricular size is normal. 3. The mitral valve is degenerative. No evidence of mitral valve regurgitation. No evidence of mitral stenosis. 4. The aortic valve is tricuspid. Aortic valve regurgitation is not visualized. Aortic valve sclerosis is  present, with no evidence of aortic valve stenosis. 5. Aortic dilatation noted. There  is mild dilatation of the aortic root, measuring 39 mm. There is mild dilatation of the ascending aorta, measuring 41 mm.   Stress Testing: Lexiscan Nuclear stress test 11/14/2021: Low risk  Coronary artery calcium score: 07/20/2021 Left Main: 0   LAD: 587   LCx: 89.3   RCA: 470   Total Agatston Score: 1455   MESA database percentile: 88   AORTA MEASUREMENTS:   Ascending Aorta: 42 cm   Descending Aorta: 32 cm 1. Total Agatston score of 1455. This places the patient in the 88th percentile for age, gender and race/ethnicity. 2. Aortic atherosclerosis. 3. Mild aneurysmal dilation of the ascending thoracic aorta.  Recommend annual imaging followup by CTA or MRA   [place] AAA Duplex limited: 12/01/2021: Abdominal Aorta: There is evidence of abnormal dilatation of the proximal Abdominal aorta. The largest aortic measurement is 4.0 cm. The largest aortic diameter remains essentially unchanged compared to prior exam.  Previous diameter measurement was 4.0 cm obtained on 03/31/2019.   RADIOLOGY: CT chest w/o contrast 09/24/2022 1. Stable mild dilatation of the ascending thoracic aorta measuring approximately 4.2 cm. There also is stable mild dilatation of the aortic arch and descending thoracic aorta. Recommend annual imaging followup by CTA or MRA. This recommendation follows 2010 ACCF/AHA/AATS/ACR/ASA/SCA/SCAI/SIR/STS/SVM Guidelines for the Diagnosis and Management of Patients with Thoracic Aortic Disease. Circulation. 2010; 121: W098-J191. Aortic aneurysm NOS (ICD10-I71.9) 2. Extensive calcified coronary artery plaque. 3. Stable 3 mm subpleural nodule in the superior segment of the left lower lobe. Stable 2 mm anterior subpleural nodule in the right upper lobe. Similar 2 mm posterior subpleural nodule in the superior left upper lobe was not imaged on the prior study. No follow-up needed if patient is low-risk (and has no known or suspected primary neoplasm). Non-contrast chest CT can be  considered in 12 months if patient is high-risk. This recommendation follows the consensus statement: Guidelines for Management of Incidental Pulmonary Nodules Detected on CT Images: From the Fleischner Society 2017; Radiology 2017; 284:228-243. 4. Cholelithiasis. 5. Aortic atherosclerosis.   Aortic aneurysm NOS (ICD10-I71.9).  Risk Assessment/Calculations:   NA   Labs:       Latest Ref Rng & Units 11/22/2022    8:42 AM 05/31/2022    8:40 AM 11/23/2021    8:45 AM  CBC  WBC 4.0 - 10.5 K/uL 3.3  4.1  3.1   Hemoglobin 13.0 - 17.0 g/dL 47.8  29.5  62.1   Hematocrit 39.0 - 52.0 % 32.7  33.7  30.6   Platelets 150 - 400 K/uL 136  152  135        Latest Ref Rng & Units 11/22/2022    8:42 AM 05/31/2022    8:40 AM 11/23/2021    8:45 AM  BMP  Glucose 70 - 99 mg/dL 308  657  846   BUN 8 - 23 mg/dL 38  28  27   Creatinine 0.61 - 1.24 mg/dL 9.62  9.52  8.41   Sodium 135 - 145 mmol/L 135  137  137   Potassium 3.5 - 5.1 mmol/L 4.4  4.4  4.3   Chloride 98 - 111 mmol/L 104  105  105   CO2 22 - 32 mmol/L 28  28  27    Calcium 8.9 - 10.3 mg/dL 9.7  9.9  9.8       Latest Ref Rng & Units 11/22/2022    8:42 AM 05/31/2022  8:40 AM 11/23/2021    8:45 AM  CMP  Glucose 70 - 99 mg/dL 161  096  045   BUN 8 - 23 mg/dL 38  28  27   Creatinine 0.61 - 1.24 mg/dL 4.09  8.11  9.14   Sodium 135 - 145 mmol/L 135  137  137   Potassium 3.5 - 5.1 mmol/L 4.4  4.4  4.3   Chloride 98 - 111 mmol/L 104  105  105   CO2 22 - 32 mmol/L 28  28  27    Calcium 8.9 - 10.3 mg/dL 9.7  9.9  9.8   Total Protein 6.5 - 8.1 g/dL 8.4  8.9  9.2   Total Bilirubin <1.2 mg/dL 0.6  0.8  0.6   Alkaline Phos 38 - 126 U/L 75  76  92   AST 15 - 41 U/L 36  46  32   ALT 0 - 44 U/L 41  31  21     No results found for: "CHOL", "HDL", "LDLCALC", "LDLDIRECT", "TRIG", "CHOLHDL" No results for input(s): "LIPOA" in the last 8760 hours. No components found for: "NTPROBNP" No results for input(s): "PROBNP" in the last 8760 hours. No  results for input(s): "TSH" in the last 8760 hours.  External Labs: Collected: 12/19/2022 [facility] database. Total cholesterol 153, HDL 36, LDL 84, triglycerides 191 N8G 6.7. Serum creatinine 1.22.   Physical Exam:    Today's Vitals   03/14/23 0851 03/14/23 0904  BP: (!) 156/92 138/72  Pulse: 63   Resp: 16   SpO2: 97%   Weight: 241 lb 9.6 oz (109.6 kg)   Height: 5\' 8"  (1.727 m)    Body mass index is 36.74 kg/m. Wt Readings from Last 3 Encounters:  03/14/23 241 lb 9.6 oz (109.6 kg)  12/20/22 240 lb (108.9 kg)  09/01/22 236 lb 3.2 oz (107.1 kg)    Physical Exam  Constitutional: No distress.  Age appropriate, hemodynamically stable.   Neck: No JVD present.  Cardiovascular: Normal rate, regular rhythm, S1 normal, S2 normal and intact distal pulses. Exam reveals no gallop, no S3 and no S4.  No murmur heard. Pulses:      Radial pulses are 2+ on the right side and 2+ on the left side.       Dorsalis pedis pulses are 2+ on the right side and 2+ on the left side.       Posterior tibial pulses are 2+ on the right side and 2+ on the left side.  Pulmonary/Chest: Effort normal and breath sounds normal. No stridor. He has no wheezes. He has no rales.  Abdominal: Soft. Bowel sounds are normal. He exhibits no distension. There is no abdominal tenderness.  Musculoskeletal:        General: No edema.     Cervical back: Neck supple.     Comments: Wears a back brace.  Neurological: He is alert and oriented to person, place, and time. He has intact cranial nerves (2-12).  Skin: Skin is warm and moist.     Impression & Recommendation(s):  Impression:   ICD-10-CM   1. Coronary atherosclerosis due to calcified coronary lesion  I25.10 EKG 12-Lead   I25.84     2. Agatston CAC score, >400  R93.1     3. Atherosclerosis of aorta (HCC)  I70.0     4. Ascending aorta dilation (HCC)  I77.810     5. Benign hypertension  I10     6. Abdominal aortic aneurysm (AAA) without rupture,  unspecified  part (HCC)  I71.40     7. Hyperlipidemia, unspecified hyperlipidemia type  E78.5 AMB Referral to [facility] Pharm-D       Recommendation(s):  Coronary atherosclerosis due to calcified coronary lesion Agatston CAC score, >400 Atherosclerosis of aorta (HCC) Total CAC 1455, 88 percentile. Denies anginal chest pain or heart failure symptoms. Has undergone appropriate ischemic workup as outlined above. EKG nonischemic. Reemphasized the importance of secondary prevention with focus on improving her modifiable cardiovascular risk factors such as glycemic control, lipid management, blood pressure control, weight loss.  Ascending aorta dilation (HCC) Most recent evaluation September 2024-ascending thoracic aorta measures 42 mm, stable when compared to prior studies. Reemphasized importance of blood pressure management. Office blood pressures are not at goal. Home blood pressures are better controlled. Does not want to uptitrate blood pressure medications at this time due to concerns for lightheaded and dizziness as he is also requiring increased onset of pain medications. Will need to follow-up closely. Recommend a goal SBP <130 mmHg. Patient is a aware of avoiding biotic such as fluoroquinolone. Patient is also aware not to overexert with lifting heavy objects that require him to increase intra-abdominal pressures/Valsalva maneuver. Patient is aware of symptoms consistent with aortic syndromes and if present should go to the closest ER via EMS. Will repeat CT chest in September 2025  Benign hypertension Office blood pressures are not at goal. Home blood pressures are better controlled. Patient would like to hold off on up titration of medical therapy for reasons mentioned above. Currently on lisinopril/hydrochlorothiazide 20/25 milligrams p.o. daily  Abdominal aortic aneurysm (AAA) without rupture, unspecified part (HCC) Follows with vascular surgery.  Hyperlipidemia, unspecified  hyperlipidemia type Currently on Crestor 5 mg p.o. daily. Given his severe CAC, aortic atherosclerosis and a goal LDL at least <70 mg/dL and if possible closer to 55 mg/dL. Patient states by increasing statin therapy he has noticed back pain to avoid statin therapy he initially was planning to be on Ozempic but it due to GI effects he is no longer that class of medications. We discussed initiation of Repatha-patient is willing to proceed forward we will refer him to Pharm.D to coordinate his care.   Orders Placed:  Orders Placed This Encounter  Procedures   AMB Referral to [facility] Pharm-D    Referral Priority:   Routine    Referral Type:   Consultation    Referral Reason:   Specialty Services Required    Number of Visits Requested:   1   EKG 12-Lead     Final Medication List:   No orders of the defined types were placed in this encounter.   There are no discontinued medications.   Current Outpatient Medications:    acetaminophen (TYLENOL) 650 MG CR tablet, Take 650 mg by mouth every 8 (eight) hours as needed for pain., Disp: , Rfl:    aspirin EC (ASPIRIN ADULT LOW DOSE) 81 MG tablet, TAKE 1 TABLET BY MOUTH DAILY *SWALLOW WHOLE*, Disp: 120 tablet, Rfl: 0   Cholecalciferol (VITAMIN D) 125 MCG (5000 UT) CAPS, Take 5,000 Units by mouth daily., Disp: , Rfl:    diclofenac (VOLTAREN) 50 MG EC tablet, Take 50 mg by mouth 2 (two) times daily., Disp: , Rfl:    famotidine (PEPCID) 20 MG tablet, Take 20 mg by mouth daily., Disp: , Rfl:    gabapentin (NEURONTIN) 600 MG tablet, Take 1 tablet by mouth daily., Disp: , Rfl:    HYDROcodone-acetaminophen (NORCO/VICODIN) 5-325 MG tablet, Take 1 tablet by mouth  every 6 (six) hours as needed., Disp: , Rfl:    lisinopril-hydrochlorothiazide (ZESTORETIC) 20-25 MG tablet, Take 1 tablet by mouth daily., Disp: , Rfl:    metFORMIN (GLUCOPHAGE-XR) 500 MG 24 hr tablet, Take 500 mg by mouth daily., Disp: , Rfl:    pantoprazole (PROTONIX) 40 MG tablet, Take 40  mg by mouth daily., Disp: , Rfl:    rosuvastatin (CRESTOR) 5 MG tablet, Take 5 mg by mouth daily., Disp: , Rfl:   Consent:   NA  Disposition:   October 2025  His questions and concerns were addressed to his satisfaction. He voices understanding of the recommendations provided during this encounter.    Signed, Tessa Lerner, DO, [facility]  [facility]  [address] [place], [place] [address] 03/14/2023 11:48 AM

## 2023-03-15 DIAGNOSIS — R972 Elevated prostate specific antigen [PSA]: Secondary | ICD-10-CM | POA: Diagnosis not present

## 2023-03-21 ENCOUNTER — Encounter (HOSPITAL_BASED_OUTPATIENT_CLINIC_OR_DEPARTMENT_OTHER): Payer: Self-pay

## 2023-03-27 DIAGNOSIS — M4316 Spondylolisthesis, lumbar region: Secondary | ICD-10-CM | POA: Diagnosis not present

## 2023-03-29 DIAGNOSIS — M4316 Spondylolisthesis, lumbar region: Secondary | ICD-10-CM | POA: Diagnosis not present

## 2023-04-03 DIAGNOSIS — M4316 Spondylolisthesis, lumbar region: Secondary | ICD-10-CM | POA: Diagnosis not present

## 2023-04-05 DIAGNOSIS — M4316 Spondylolisthesis, lumbar region: Secondary | ICD-10-CM | POA: Diagnosis not present

## 2023-04-10 DIAGNOSIS — M4316 Spondylolisthesis, lumbar region: Secondary | ICD-10-CM | POA: Diagnosis not present

## 2023-04-12 DIAGNOSIS — M4316 Spondylolisthesis, lumbar region: Secondary | ICD-10-CM | POA: Diagnosis not present

## 2023-04-12 DIAGNOSIS — H6123 Impacted cerumen, bilateral: Secondary | ICD-10-CM | POA: Diagnosis not present

## 2023-04-20 DIAGNOSIS — M4316 Spondylolisthesis, lumbar region: Secondary | ICD-10-CM | POA: Diagnosis not present

## 2023-04-20 DIAGNOSIS — M19049 Primary osteoarthritis, unspecified hand: Secondary | ICD-10-CM | POA: Diagnosis not present

## 2023-04-24 DIAGNOSIS — M4316 Spondylolisthesis, lumbar region: Secondary | ICD-10-CM | POA: Diagnosis not present

## 2023-04-26 DIAGNOSIS — M4316 Spondylolisthesis, lumbar region: Secondary | ICD-10-CM | POA: Diagnosis not present

## 2023-05-02 ENCOUNTER — Other Ambulatory Visit: Payer: Self-pay | Admitting: Internal Medicine

## 2023-05-02 DIAGNOSIS — I7121 Aneurysm of the ascending aorta, without rupture: Secondary | ICD-10-CM

## 2023-05-03 DIAGNOSIS — M4316 Spondylolisthesis, lumbar region: Secondary | ICD-10-CM | POA: Diagnosis not present

## 2023-05-08 DIAGNOSIS — M4316 Spondylolisthesis, lumbar region: Secondary | ICD-10-CM | POA: Diagnosis not present

## 2023-05-10 DIAGNOSIS — M4316 Spondylolisthesis, lumbar region: Secondary | ICD-10-CM | POA: Diagnosis not present

## 2023-05-14 DIAGNOSIS — Z125 Encounter for screening for malignant neoplasm of prostate: Secondary | ICD-10-CM | POA: Diagnosis not present

## 2023-05-14 DIAGNOSIS — E1122 Type 2 diabetes mellitus with diabetic chronic kidney disease: Secondary | ICD-10-CM | POA: Diagnosis not present

## 2023-05-14 DIAGNOSIS — E785 Hyperlipidemia, unspecified: Secondary | ICD-10-CM | POA: Diagnosis not present

## 2023-05-14 DIAGNOSIS — I10 Essential (primary) hypertension: Secondary | ICD-10-CM | POA: Diagnosis not present

## 2023-05-15 DIAGNOSIS — M4316 Spondylolisthesis, lumbar region: Secondary | ICD-10-CM | POA: Diagnosis not present

## 2023-05-17 DIAGNOSIS — M4316 Spondylolisthesis, lumbar region: Secondary | ICD-10-CM | POA: Diagnosis not present

## 2023-05-18 DIAGNOSIS — E1122 Type 2 diabetes mellitus with diabetic chronic kidney disease: Secondary | ICD-10-CM | POA: Diagnosis not present

## 2023-05-18 DIAGNOSIS — D696 Thrombocytopenia, unspecified: Secondary | ICD-10-CM | POA: Diagnosis not present

## 2023-05-18 DIAGNOSIS — M4807 Spinal stenosis, lumbosacral region: Secondary | ICD-10-CM | POA: Diagnosis not present

## 2023-05-18 DIAGNOSIS — I10 Essential (primary) hypertension: Secondary | ICD-10-CM | POA: Diagnosis not present

## 2023-05-18 DIAGNOSIS — N401 Enlarged prostate with lower urinary tract symptoms: Secondary | ICD-10-CM | POA: Diagnosis not present

## 2023-05-18 DIAGNOSIS — D472 Monoclonal gammopathy: Secondary | ICD-10-CM | POA: Diagnosis not present

## 2023-05-18 DIAGNOSIS — I700 Atherosclerosis of aorta: Secondary | ICD-10-CM | POA: Diagnosis not present

## 2023-05-18 DIAGNOSIS — I2510 Atherosclerotic heart disease of native coronary artery without angina pectoris: Secondary | ICD-10-CM | POA: Diagnosis not present

## 2023-05-18 DIAGNOSIS — K219 Gastro-esophageal reflux disease without esophagitis: Secondary | ICD-10-CM | POA: Diagnosis not present

## 2023-05-18 DIAGNOSIS — R3129 Other microscopic hematuria: Secondary | ICD-10-CM | POA: Diagnosis not present

## 2023-05-18 DIAGNOSIS — Z0000 Encounter for general adult medical examination without abnormal findings: Secondary | ICD-10-CM | POA: Diagnosis not present

## 2023-05-24 DIAGNOSIS — M4316 Spondylolisthesis, lumbar region: Secondary | ICD-10-CM | POA: Diagnosis not present

## 2023-05-28 DIAGNOSIS — M19011 Primary osteoarthritis, right shoulder: Secondary | ICD-10-CM | POA: Diagnosis not present

## 2023-05-29 ENCOUNTER — Telehealth: Payer: Self-pay | Admitting: *Deleted

## 2023-05-29 ENCOUNTER — Other Ambulatory Visit: Payer: Self-pay | Admitting: Orthopedic Surgery

## 2023-05-29 ENCOUNTER — Telehealth: Payer: Self-pay

## 2023-05-29 DIAGNOSIS — M4316 Spondylolisthesis, lumbar region: Secondary | ICD-10-CM | POA: Diagnosis not present

## 2023-05-29 NOTE — Telephone Encounter (Signed)
 Pt has been scheduled tele preop appt 06/13/23. Med rec and consent are done.       Patient Consent for Virtual Visit        Walter White. has provided verbal consent on 05/29/2023 for a virtual visit (video or telephone).   CONSENT FOR VIRTUAL VISIT FOR:  Walter White.  By participating in this virtual visit I agree to the following:  I hereby voluntarily request, consent and authorize [facility] and its employed or contracted physicians, physician assistants, nurse practitioners or other licensed health care professionals (the Practitioner), to provide me with telemedicine health care services (the "Services") as deemed necessary by the treating Practitioner. I acknowledge and consent to receive the Services by the Practitioner via telemedicine. I understand that the telemedicine visit will involve communicating with the Practitioner through live audiovisual communication technology and the disclosure of certain medical information by electronic transmission. I acknowledge that I have been given the opportunity to request an in-person assessment or other available alternative prior to the telemedicine visit and am voluntarily participating in the telemedicine visit.  I understand that I have the right to withhold or withdraw my consent to the use of telemedicine in the course of my care at any time, without affecting my right to future care or treatment, and that the Practitioner or I may terminate the telemedicine visit at any time. I understand that I have the right to inspect all information obtained and/or recorded in the course of the telemedicine visit and may receive copies of available information for a reasonable fee.  I understand that some of the potential risks of receiving the Services via telemedicine include:  Delay or interruption in medical evaluation due to technological equipment failure or disruption; Information transmitted may not be sufficient (e.g.  poor resolution of images) to allow for appropriate medical decision making by the Practitioner; and/or  In rare instances, security protocols could fail, causing a breach of personal health information.  Furthermore, I acknowledge that it is my responsibility to provide information about my medical history, conditions and care that is complete and accurate to the best of my ability. I acknowledge that Practitioner's advice, recommendations, and/or decision may be based on factors not within their control, such as incomplete or inaccurate data provided by me or distortions of diagnostic images or specimens that may result from electronic transmissions. I understand that the practice of medicine is not an exact science and that Practitioner makes no warranties or guarantees regarding treatment outcomes. I acknowledge that a copy of this consent can be made available to me via my patient portal [facility] MyChart), or I can request a printed copy by calling the office of [facility].    I understand that my insurance will be billed for this visit.   I have read or had this consent read to me. I understand the contents of this consent, which adequately explains the benefits and risks of the Services being provided via telemedicine.  I have been provided ample opportunity to ask questions regarding this consent and the Services and have had my questions answered to my satisfaction. I give my informed consent for the services to be provided through the use of telemedicine in my medical care

## 2023-05-29 NOTE — Telephone Encounter (Signed)
 LVM regarding preop clearance.

## 2023-05-29 NOTE — Telephone Encounter (Signed)
   Name: Walter White.  DOB: 1950/03/23  MRN: [id number]  Primary Cardiologist: [name], DO   Preoperative team, please contact this patient and set up a phone call appointment for further preoperative risk assessment. Please obtain consent and complete medication review. Thank you for your help.  I confirm that guidance regarding antiplatelet and oral anticoagulation therapy has been completed and, if necessary, noted below.  He may hold aspirin  for 5-7 days prior to procedure. Please resume aspirin  as soon as possible postprocedure, at the discretion of the surgeon.    I also confirmed the patient resides in the state of [place] . As per [facility] Medical [employer] telemedicine laws, the patient must reside in the state in which the provider is licensed.   Ava Boatman, NP 05/29/2023, 12:55 PM [facility]

## 2023-05-29 NOTE — Telephone Encounter (Addendum)
   Pre-operative Risk Assessment    Patient Name: Walter White.  DOB: [birth year] MRN: [id number]   Date of last office visit: 03/27/2023 Date of next office visit: Not scheduled   Request for Surgical Clearance    Procedure:  Right reverse total shoulder arthroplasty  Date of Surgery:  Clearance 06/28/23                                Surgeon:  Dr. Angelo Barge Surgeon's Group or Practice Name:  [facility] Phone number:  [phone] Fax number:  [phone]   Type of Clearance Requested:   - Medical    Type of Anesthesia:  Choice   Additional requests/questions:    Gardiner Jumper   05/29/2023, 10:38 AM

## 2023-05-29 NOTE — Telephone Encounter (Signed)
 Pt has been scheduled tele preop appt 06/13/23. Med rec and consent are done.

## 2023-05-29 NOTE — Telephone Encounter (Deleted)
 [facility] correct phone number and fax number are as follows Phone: [phone] Fax: [phone]

## 2023-05-29 NOTE — Telephone Encounter (Signed)
 Patient is returning call. Please advise?

## 2023-05-29 NOTE — Telephone Encounter (Signed)
 CORRECTION OF PH AND FAX #'S FOR REQUESTING OFFICE.

## 2023-05-31 ENCOUNTER — Inpatient Hospital Stay: Payer: Medicare Other | Attending: Hematology and Oncology

## 2023-05-31 ENCOUNTER — Other Ambulatory Visit: Payer: Self-pay | Admitting: Hematology and Oncology

## 2023-05-31 DIAGNOSIS — D472 Monoclonal gammopathy: Secondary | ICD-10-CM

## 2023-05-31 DIAGNOSIS — M4316 Spondylolisthesis, lumbar region: Secondary | ICD-10-CM | POA: Diagnosis not present

## 2023-05-31 LAB — CMP (CANCER CENTER ONLY)
ALT: 21 U/L (ref 0–44)
AST: 31 U/L (ref 15–41)
Albumin: 4.3 g/dL (ref 3.5–5.0)
Alkaline Phosphatase: 72 U/L (ref 38–126)
Anion gap: 6 (ref 5–15)
BUN: 30 mg/dL — ABNORMAL HIGH (ref 8–23)
CO2: 25 mmol/L (ref 22–32)
Calcium: 9.7 mg/dL (ref 8.9–10.3)
Chloride: 104 mmol/L (ref 98–111)
Creatinine: 1.17 mg/dL (ref 0.61–1.24)
GFR, Estimated: >60 mL/min (ref >60)
Glucose, Bld: 191 mg/dL — ABNORMAL HIGH (ref 70–99)
Potassium: 4.1 mmol/L (ref 3.5–5.1)
Sodium: 135 mmol/L (ref 135–145)
Total Bilirubin: 0.8 mg/dL (ref 0.0–1.2)
Total Protein: 8.8 g/dL — ABNORMAL HIGH (ref 6.5–8.1)

## 2023-05-31 LAB — CBC WITH DIFFERENTIAL (CANCER CENTER ONLY)
Abs Immature Granulocytes: 0 10*3/uL (ref 0.00–0.07)
Basophils Absolute: 0 10*3/uL (ref 0.0–0.1)
Basophils Relative: 1 %
Eosinophils Absolute: 0.1 10*3/uL (ref 0.0–0.5)
Eosinophils Relative: 4 %
HCT: 31.7 % — ABNORMAL LOW (ref 39.0–52.0)
Hemoglobin: 10.9 g/dL — ABNORMAL LOW (ref 13.0–17.0)
Immature Granulocytes: 0 %
Lymphocytes Relative: 25 %
Lymphs Abs: 0.7 10*3/uL (ref 0.7–4.0)
MCH: 31.5 pg (ref 26.0–34.0)
MCHC: 34.4 g/dL (ref 30.0–36.0)
MCV: 91.6 fL (ref 80.0–100.0)
Monocytes Absolute: 0.2 10*3/uL (ref 0.1–1.0)
Monocytes Relative: 8 %
Neutro Abs: 1.7 10*3/uL (ref 1.7–7.7)
Neutrophils Relative %: 62 %
Platelet Count: 124 10*3/uL — ABNORMAL LOW (ref 150–400)
RBC: 3.46 MIL/uL — ABNORMAL LOW (ref 4.22–5.81)
RDW: 14 % (ref 11.5–15.5)
WBC Count: 2.8 10*3/uL — ABNORMAL LOW (ref 4.0–10.5)
nRBC: 0 % (ref 0.0–0.2)

## 2023-05-31 LAB — LACTATE DEHYDROGENASE: LDH: 169 U/L (ref 98–192)

## 2023-06-01 LAB — KAPPA/LAMBDA LIGHT CHAINS
Kappa free light chain: 67.6 mg/L — ABNORMAL HIGH (ref 3.3–19.4)
Kappa, lambda light chain ratio: 7.27 — ABNORMAL HIGH (ref 0.26–1.65)
Lambda free light chains: 9.3 mg/L (ref 5.7–26.3)

## 2023-06-03 LAB — MULTIPLE MYELOMA PANEL, SERUM
Albumin SerPl Elph-Mcnc: 3.7 g/dL (ref 2.9–4.4)
Albumin/Glob SerPl: 0.9 (ref 0.7–1.7)
Alpha 1: 0.3 g/dL (ref 0.0–0.4)
Alpha2 Glob SerPl Elph-Mcnc: 0.8 g/dL (ref 0.4–1.0)
B-Globulin SerPl Elph-Mcnc: 0.9 g/dL (ref 0.7–1.3)
Gamma Glob SerPl Elph-Mcnc: 2.6 g/dL — ABNORMAL HIGH (ref 0.4–1.8)
Globulin, Total: 4.6 g/dL — ABNORMAL HIGH (ref 2.2–3.9)
IgA: 10 mg/dL — ABNORMAL LOW (ref 61–437)
IgG (Immunoglobin G), Serum: 3270 mg/dL — ABNORMAL HIGH (ref 603–1613)
IgM (Immunoglobulin M), Srm: 28 mg/dL (ref 15–143)
M Protein SerPl Elph-Mcnc: 2.3 g/dL — ABNORMAL HIGH
Total Protein ELP: 8.3 g/dL (ref 6.0–8.5)

## 2023-06-11 NOTE — Patient Instructions (Signed)
 SURGICAL WAITING ROOM VISITATION  Patients having surgery or a procedure may have no more than 2 support people in the waiting area - these visitors may rotate.    Children under the age of 10 must have an adult with them who is not the patient.   Visitors with respiratory illnesses are discouraged from visiting and should remain at home.  If the patient needs to stay at the hospital during part of their recovery, the visitor guidelines for inpatient rooms apply. Pre-op nurse will coordinate an appropriate time for 1 support person to accompany patient in pre-op.  This support person may not rotate.    Please refer to the [facility] website for the visitor guidelines for Inpatients (after your surgery is over and you are in a regular room).       Your procedure is scheduled on:  07/05/2023    Report to [facility] Main Entrance    Report to admitting at  0730 AM   Call this number if you have problems the morning of surgery [phone]   Do not eat food :After Midnight.   After Midnight you may have the following liquids until __ 0700____ AM/  DAY OF SURGERY  Water  Non-Citrus Juices (without pulp, NO RED-Apple, White grape, White cranberry) Black Coffee (NO MILK/CREAM OR CREAMERS, sugar ok)  Clear Tea (NO MILK/CREAM OR CREAMERS, sugar ok) regular and decaf                             Plain Jell-O (NO RED)                                           Fruit ices (not with fruit pulp, NO RED)                                     Popsicles (NO RED)                                                               Sports drinks like Gatorade (NO RED)                    The day of surgery:  Drink ONE (1) Pre-Surgery Clear Ensure or G2 at  0700 AM ( have completed by )  the morning of surgery. Drink in one sitting. Do not sip.  This drink was given to you during your hospital  pre-op appointment visit. Nothing else to drink after completing the  Pre-Surgery Clear Ensure or  G2.          If you have questions, please contact your surgeon's office.       Oral Hygiene is also important to reduce your risk of infection.                                    Remember - BRUSH YOUR TEETH THE MORNING OF SURGERY WITH YOUR REGULAR TOOTHPASTE  DENTURES WILL BE REMOVED PRIOR TO  SURGERY PLEASE DO NOT APPLY "Poly grip" OR ADHESIVES!!!   Do NOT smoke after Midnight   Stop all vitamins and herbal supplements 7 days before surgery.   Take these medicines the morning of surgery with A SIP OF WATER :  protonix                 Metformin- none am of surgery   DO NOT TAKE ANY ORAL DIABETIC MEDICATIONS DAY OF YOUR SURGERY  Bring CPAP mask and tubing day of surgery.                              You may not have any metal on your body including hair pins, jewelry, and body piercing             Do not wear make-up, lotions, powders, perfumes/cologne, or deodorant  Do not wear nail polish including gel and S&S, artificial/acrylic nails, or any other type of covering on natural nails including finger and toenails. If you have artificial nails, gel coating, etc. that needs to be removed by a nail salon please have this removed prior to surgery or surgery may need to be canceled/ delayed if the surgeon/ anesthesia feels like they are unable to be safely monitored.   Do not shave  48 hours prior to surgery.               Men may shave face and neck.   Do not bring valuables to the hospital.  IS NOT             RESPONSIBLE   FOR VALUABLES.   Contacts, glasses, dentures or bridgework may not be worn into surgery.   Bring small overnight bag day of surgery.   DO NOT BRING YOUR HOME MEDICATIONS TO THE HOSPITAL. PHARMACY WILL DISPENSE MEDICATIONS LISTED ON YOUR MEDICATION LIST TO YOU DURING YOUR ADMISSION IN THE HOSPITAL!    Patients discharged on the day of surgery will not be allowed to drive home.  Someone NEEDS to stay with you for the first 24 hours after  anesthesia.   Special Instructions: Bring a copy of your healthcare power of attorney and living will documents the day of surgery if you haven't scanned them before.              Please read over the following fact sheets you were given: IF YOU HAVE QUESTIONS ABOUT YOUR PRE-OP INSTRUCTIONS PLEASE CALL [phone]   If you received a COVID test during your pre-op visit  it is requested that you wear a mask when out in public, stay away from anyone that may not be feeling well and notify your surgeon if you develop symptoms. If you test positive for Covid or have been in contact with anyone that has tested positive in the last 10 days please notify you surgeon.      Pre-operative 5 CHG Bath Instructions   You can play a key role in reducing the risk of infection after surgery. Your skin needs to be as free of germs as possible. You can reduce the number of germs on your skin by washing with CHG (chlorhexidine  gluconate) soap before surgery. CHG is an antiseptic soap that kills germs and continues to kill germs even after washing.   DO NOT use if you have an allergy to chlorhexidine /CHG or antibacterial soaps. If your skin becomes reddened or irritated, stop using the CHG and notify one of our RNs at  [phone].   Please shower with the CHG soap starting 4 days before surgery using the following schedule:     Please keep in mind the following:  DO NOT shave, including legs and underarms, starting the day of your first shower.   You may shave your face at any point before/day of surgery.  Place clean sheets on your bed the day you start using CHG soap. Use a clean washcloth (not used since being washed) for each shower. DO NOT sleep with pets once you start using the CHG.   CHG Shower Instructions:  If you choose to wash your hair and private area, wash first with your normal shampoo/soap.  After you use shampoo/soap, rinse your hair and body thoroughly to remove shampoo/soap residue.   Turn the water  OFF and apply about 3 tablespoons (45 ml) of CHG soap to a CLEAN washcloth.  Apply CHG soap ONLY FROM YOUR NECK DOWN TO YOUR TOES (washing for 3-5 minutes)  DO NOT use CHG soap on face, private areas, open wounds, or sores.  Pay special attention to the area where your surgery is being performed.  If you are having back surgery, having someone wash your back for you may be helpful. Wait 2 minutes after CHG soap is applied, then you may rinse off the CHG soap.  Pat dry with a clean towel  Put on clean clothes/pajamas   If you choose to wear lotion, please use ONLY the CHG-compatible lotions on the back of this paper.     Additional instructions for the day of surgery: DO NOT APPLY any lotions, deodorants, cologne, or perfumes.   Put on clean/comfortable clothes.  Brush your teeth.  Ask your nurse before applying any prescription medications to the skin.      CHG Compatible Lotions   Aveeno Moisturizing lotion  Cetaphil Moisturizing Cream  Cetaphil Moisturizing Lotion  Clairol Herbal Essence Moisturizing Lotion, Dry Skin  Clairol Herbal Essence Moisturizing Lotion, Extra Dry Skin  Clairol Herbal Essence Moisturizing Lotion, Normal Skin  Curel Age Defying Therapeutic Moisturizing Lotion with Alpha Hydroxy  Curel Extreme Care Body Lotion  Curel Soothing Hands Moisturizing Hand Lotion  Curel Therapeutic Moisturizing Cream, Fragrance-Free  Curel Therapeutic Moisturizing Lotion, Fragrance-Free  Curel Therapeutic Moisturizing Lotion, Original Formula  Eucerin Daily Replenishing Lotion  Eucerin Dry Skin Therapy Plus Alpha Hydroxy Crme  Eucerin Dry Skin Therapy Plus Alpha Hydroxy Lotion  Eucerin Original Crme  Eucerin Original Lotion  Eucerin Plus Crme Eucerin Plus Lotion  Eucerin TriLipid Replenishing Lotion  Keri Anti-Bacterial Hand Lotion  Keri Deep Conditioning Original Lotion Dry Skin Formula Softly Scented  Keri Deep Conditioning Original Lotion, Fragrance  Free Sensitive Skin Formula  Keri Lotion Fast Absorbing Fragrance Free Sensitive Skin Formula  Keri Lotion Fast Absorbing Softly Scented Dry Skin Formula  Keri Original Lotion  Keri Skin Renewal Lotion Keri Silky Smooth Lotion  Keri Silky Smooth Sensitive Skin Lotion  Nivea Body Creamy Conditioning Oil  Nivea Body Extra Enriched Lotion  Nivea Body Original Lotion  Nivea Body Sheer Moisturizing Lotion Nivea Crme  Nivea Skin Firming Lotion  NutraDerm 30 Skin Lotion  NutraDerm Skin Lotion  NutraDerm Therapeutic Skin Cream  NutraDerm Therapeutic Skin Lotion  ProShield Protective Hand Cream  Provon moisturizing lotion   [place]- Preparing for Total Shoulder Arthroplasty    Before surgery, you can play an important role. Because skin is not sterile, your skin needs to be as free of germs as possible. You can reduce the number of germs  on your skin by using the following products. Benzoyl Peroxide Gel Reduces the number of germs present on the skin Applied twice a day to shoulder area starting two days before surgery    ==================================================================  Please follow these instructions carefully:  BENZOYL PEROXIDE 5% GEL  Please do not use if you have an allergy to benzoyl peroxide.   If your skin becomes reddened/irritated stop using the benzoyl peroxide.  Starting two days before surgery, apply as follows: Apply benzoyl peroxide in the morning and at night. Apply after taking a shower. If you are not taking a shower clean entire shoulder front, back, and side along with the armpit with a clean wet washcloth.  Place a quarter-sized dollop on your shoulder and rub in thoroughly, making sure to cover the front, back, and side of your shoulder, along with the armpit.   2 days before ____ AM   ____ PM              1 day before ____ AM   ____ PM                         Do this twice a day for two days.  (Last application is the night before surgery,  AFTER using the CHG soap as described below).  Do NOT apply benzoyl peroxide gel on the day of surgery.

## 2023-06-11 NOTE — Progress Notes (Signed)
 Anesthesia Review:  PCP: Cardiologist :  PPM/ ICD: Device Orders: Rep Notified:  Chest x-ray : CT Chest-09/24/22  EKG : 03/14/23  AAA-12/20/22 duplex  Echo : 2023  CT Card- 2023  Stress test: Cardiac Cath :   Activity level:  Sleep Study/ CPAP : Fasting Blood Sugar :      / Checks Blood Sugar -- times a day:    Blood Thinner/ Instructions /Last Dose: ASA / Instructions/ Last Dose :    05/31/23- CBC/Diff and CMP

## 2023-06-13 ENCOUNTER — Ambulatory Visit: Attending: Cardiology

## 2023-06-13 DIAGNOSIS — Z01810 Encounter for preprocedural cardiovascular examination: Secondary | ICD-10-CM

## 2023-06-13 NOTE — Progress Notes (Signed)
 Virtual Visit via Telephone Note   Because of Walter White. co-morbid illnesses, he is at least at moderate risk for complications without adequate follow up.  This format is felt to be most appropriate for this patient at this time.  Due to technical limitations with video connection (technology), today's appointment will be conducted as an audio only telehealth visit, and [name] verbally agreed to proceed in this manner.   All issues noted in this document were discussed and addressed.  No physical exam could be performed with this format.  Evaluation Performed:  Preoperative cardiovascular risk assessment _____________   Date:  06/13/2023   Patient ID:  Walter White., DOB 1950/07/04, MRN [id number] Patient Location:  Home Provider location:   Office  Primary Care Provider:  [name], MD Primary Cardiologist:  [name], DO  Chief Complaint / Patient Profile  73 y.o. y/o male with a h/o elevated coronary artery calcium  scoring, ascending aorta dilation, hypertension, AAA, MGUS, former tobacco use who is pending right reverse total shoulder arthroplasty and presents today for telephonic preoperative cardiovascular risk assessment. History of Present Illness  Walter White. is a 73 y.o. male who presents via audio/video conferencing for a telehealth visit today.  Pt was last seen in cardiology clinic on 03/14/2023 by [name].  At that time Walter White. was doing well.  The patient is now pending procedure as outlined above. Since his last visit, he has remained stable from a cardiac standpoint. Today he denies chest pain, shortness of breath, lower extremity edema, fatigue, palpitations, melena, hematuria, hemoptysis, diaphoresis, weakness, presyncope, syncope, orthopnea, and PND.  Patient is able to achieve greater than 4 METS of activity, he reports this weekend he was working on house and power washing. Past Medical History    Past Medical  History:  Diagnosis Date   AAA (abdominal aortic aneurysm) (HCC)    4.0 cm AAA 08/22/17 U/S   Allergy    Allergic Rhinitis   Anemia    Arthritis    spinal steniosis   Benign prostate hyperplasia    with lower urinary tract symptoms   Carpal tunnel syndrome on right    Cataracts, both eyes    Chronic back pain    caused by spinal stenosis   Elevated PSA    GERD (gastroesophageal reflux disease)    Hyperlipidemia    Hypertension    Essential.     MGUS (monoclonal gammopathy of unknown significance)    Microhematuria    OA (osteoarthritis) of knee    Pneumonia    Hx -when very young   Pre-diabetes    Seasonal allergies    Spinal stenosis    of lumbosacral region.  Causes chronic back pain.   Upper abdominal pain    Past Surgical History:  Procedure Laterality Date   ABDOMINAL EXPOSURE N/A 09/05/2017   Procedure: ABDOMINAL EXPOSURE;  Surgeon: Mayo Speck, MD;  Location: [facility] OR;  Service: Vascular;  Laterality: N/A;   ANTERIOR LAT LUMBAR FUSION Left 01/26/2016   Procedure: LEFT SIDED LATERAL INTERBODY FUSION, LUMBAR 3-4 WITH INSTRUMENTATION AND ALLOGRAFT;  Surgeon: [name], MD;  Location: [facility] OR;  Service: Orthopedics;  Laterality: Left;  LEFT SIDED LATERAL INTERBODY FUSION, LUMBAR 3-4 WITH INSTRUMENTATION AND ALLOGRAFT   ANTERIOR LAT LUMBAR FUSION Right 08/14/2018   Procedure: RIGHT LUMBAR two to LUMBAR three LATERAL INTERBODY FUSION WITH INSTRUMENTATION AND ALLOGRAFT;  Surgeon: [name], MD;  Location: [facility] OR;  Service: Orthopedics;  Laterality: Right;   ANTERIOR LATERAL LUMBAR FUSION WITH PERCUTANEOUS SCREW 1 LEVEL N/A 09/05/2017   Procedure: LUMBAR 5 - SACRUM 1 ANTERIOR LUMBAR INTERBODY FUSION WITH INSTRUMENTATION AND ALLOGRAFT;  Surgeon: [name], MD;  Location: [facility] OR;  Service: Orthopedics;  Laterality: N/A;   BACK SURGERY  01/2015   dr. Jackee Marus. [phone].   CARPAL TUNNEL RELEASE     right    CATARACT EXTRACTION, BILATERAL     COLONOSCOPY     COLONOSCOPY   2010,2016   polyp   ELBOW SURGERY     right ulnar release   ELBOW SURGERY     LEFT CTR   EYE SURGERY     FINGER ARTHROPLASTY Left 04/04/2021   Procedure: LEFT INDEX FINGER IMPLANT ARTHROPLASTY AT METACARPOPHALANGEAL JOINT;  Surgeon: [name], MD;  Location: [facility];  Service: Orthopedics;  Laterality: Left;  PRE-OP BLOCK,   FINGER ARTHROPLASTY Left 09/16/2021   Procedure: LEFT INDEX FINGER METACARPOPHALANGEAL JOINT REVISION ARTHROPLASTY;  Surgeon: [name], MD;  Location: [facility];  Service: Orthopedics;  Laterality: Left;   JOINT REPLACEMENT     left- Sept 2016   KNEE ARTHROSCOPY  11/23/2010   Procedure: ARTHROSCOPY KNEE;  Surgeon: [name];  Location: [facility];  Service: Orthopedics;  Laterality: Left;  arthroscopy left knee   KNEE ARTHROSCOPY     left x 2   Knee scar tissue removal Left 2018   LATERAL FUSION LUMBAR SPINE  01./17/2018   polypectomy  2010   SHOULDER ARTHROSCOPY Left    x2   TONSILLECTOMY     TOTAL KNEE ARTHROPLASTY Left 09/21/2014   Procedure: TOTAL KNEE ARTHROPLASTY;  Surgeon: [name], MD;  Location: [facility] OR;  Service: Orthopedics;  Laterality: Left;   ULNAR NERVE TRANSPOSITION Left 04/16/2017   Procedure: LEFT ULNAR NEUROPLASTY AT THE WRIST AND TRANSPOSITION AT THE ELBOW;  Surgeon: [name], MD;  Location: [facility];  Service: Orthopedics;  Laterality: Left;   WISDOM TOOTH EXTRACTION     Allergies Allergies  Allergen Reactions   Pork-Derived Products Shortness Of Breath   Shellfish Allergy Shortness Of Breath   Lovastatin Other (See Comments)    Bone pain    Penicillins     UNSPECIFIED CHILDHOOD REACTION  Has patient had a PCN reaction causing immediate rash, facial/tongue/throat swelling, SOB or lightheadedness with hypotension: Unknown Has patient had a PCN reaction causing severe rash involving mucus membranes or skin necrosis: Unknown Has patient had a PCN reaction  that required hospitalization: Unknown Has patient had a PCN reaction occurring within the last 10 years: No If all of the above answers are "NO", then may proceed with Cephalosporin use.    Home Medications    Prior to Admission medications   Medication Sig Start Date End Date Taking? Authorizing Provider  acetaminophen  (TYLENOL ) 650 MG CR tablet Take 650 mg by mouth every 8 (eight) hours as needed for pain.    [provider]  aspirin  EC (ASPIRIN  ADULT LOW DOSE) 81 MG tablet TAKE 1 TABLET BY MOUTH DAILY *SWALLOW WHOLE* Patient taking differently: Take 81 mg by mouth daily with supper. 01/01/23   Tolia, Sunit, DO  Cholecalciferol  (VITAMIN D ) 125 MCG (5000 UT) CAPS Take 5,000 Units by mouth daily with supper.    [provider]  diclofenac (VOLTAREN) 50 MG EC tablet Take 50 mg by mouth in the morning. 12/06/21   [provider]  ezetimibe (ZETIA) 10 MG tablet  Take 10 mg by mouth daily with supper. 05/18/23   [provider]  famotidine (PEPCID) 20 MG tablet Take 20 mg by mouth at bedtime. 08/10/22   [provider]  fluticasone  (FLONASE ) 50 MCG/ACT nasal spray Place 1-2 sprays into both nostrils daily as needed for allergies or rhinitis.    [provider]  gabapentin  (NEURONTIN ) 300 MG capsule Take 600 mg by mouth at bedtime. 10/01/21   [provider]  HYDROcodone -acetaminophen  (NORCO/VICODIN) 5-325 MG tablet Take 1 tablet by mouth in the morning, at noon, and at bedtime. 02/09/22   [provider]  lisinopril -hydrochlorothiazide  (ZESTORETIC ) 20-25 MG tablet Take 1 tablet by mouth in the morning. 07/11/22   [provider]  metFORMIN (GLUCOPHAGE-XR) 500 MG 24 hr tablet Take 500 mg by mouth daily with supper. 12/22/21   [provider]  pantoprazole  (PROTONIX ) 40 MG tablet Take 40 mg by mouth in the morning.    [provider]  rosuvastatin  (CRESTOR ) 5 MG tablet Take 5 mg by mouth daily with supper.     [provider]   Physical Exam  Vital Signs:  Walter White. does not have vital signs available for review today. Given telephonic nature of communication, physical exam is limited. AAOx3. NAD. Normal affect.  Speech and respirations are unlabored. Accessory Clinical Findings   None Assessment & Plan    1.  Preoperative Cardiovascular Risk Assessment: Mr. Stankus perioperative risk of a major cardiac event is 0.4% according to the Revised Cardiac Risk Index (RCRI).  Therefore, he is at low risk for perioperative complications.   His functional capacity is good at 7.34 METs according to the [facility] Activity Status Index (DASI). Recommendations: According to ACC/AHA guidelines, no further cardiovascular testing needed.  The patient may proceed to surgery at acceptable risk.   Antiplatelet and/or Anticoagulation Recommendations: Regarding ASA therapy, we recommend continuation of ASA throughout the perioperative period.  However, if the surgeon feels that cessation of ASA is required in the perioperative period, it may be stopped 5-7 days prior to surgery with a plan to resume it as soon as felt to be feasible from a surgical standpoint in the post-operative period.    The patient was advised that if he develops new symptoms prior to surgery to contact our office to arrange for a follow-up visit, and he verbalized understanding.  A copy of this note will be routed to requesting surgeon.  Time:   Today, I have spent 9 minutes with the patient with telehealth technology discussing medical history, symptoms, and management plan.    Jaydan Chretien D Skilynn Durney, NP  06/13/2023, 9:14 AM

## 2023-06-14 NOTE — Care Plan (Addendum)
 Ortho Bundle Case Management Note  Patient Details  Name: Walter White. MRN: [id number] Date of Birth: Jun 14, 1950  Spoke with patient prior to surgery. Will discharge to home with family to assist. No DME needed. OPPT set up with [facility] PT.  Patient and MD in agreement with plan. Choice offered                  DME Arranged:    DME Agency:     HH Arranged:    HH Agency:     Additional Comments: Please contact me with any questions of if this plan should need to change.  Cornelia Dieter,  RN,BSN,MHA,CCM  [facility] Orthopaedic Specialist  [phone] 06/14/2023, 3:53 PM

## 2023-06-15 ENCOUNTER — Encounter (HOSPITAL_COMMUNITY): Payer: Self-pay

## 2023-06-15 ENCOUNTER — Other Ambulatory Visit: Payer: Self-pay

## 2023-06-15 ENCOUNTER — Encounter (HOSPITAL_COMMUNITY)
Admission: RE | Admit: 2023-06-15 | Discharge: 2023-06-15 | Disposition: A | Discharge status: Home / Self Care | Source: Ambulatory Visit | Attending: Orthopedic Surgery | Admitting: Orthopedic Surgery

## 2023-06-15 ENCOUNTER — Ambulatory Visit (HOSPITAL_COMMUNITY)
Admission: RE | Admit: 2023-06-15 | Discharge: 2023-06-15 | Disposition: A | Discharge status: Home / Self Care | Source: Ambulatory Visit | Attending: Orthopedic Surgery | Admitting: Orthopedic Surgery

## 2023-06-15 VITALS — BP 131/74 | HR 62 | Temp 98.6°F | Resp 16 | Ht 67.75 in | Wt 233.0 lb

## 2023-06-15 DIAGNOSIS — Z01818 Encounter for other preprocedural examination: Secondary | ICD-10-CM | POA: Diagnosis not present

## 2023-06-15 LAB — CBC
HCT: 33.8 % — ABNORMAL LOW (ref 39.0–52.0)
Hemoglobin: 10.8 g/dL — ABNORMAL LOW (ref 13.0–17.0)
MCH: 31.4 pg (ref 26.0–34.0)
MCHC: 32 g/dL (ref 30.0–36.0)
MCV: 98.3 fL (ref 80.0–100.0)
Platelets: 134 10*3/uL — ABNORMAL LOW (ref 150–400)
RBC: 3.44 MIL/uL — ABNORMAL LOW (ref 4.22–5.81)
RDW: 13.9 % (ref 11.5–15.5)
WBC: 3.2 10*3/uL — ABNORMAL LOW (ref 4.0–10.5)
nRBC: 0 % (ref 0.0–0.2)

## 2023-06-15 LAB — BASIC METABOLIC PANEL WITH GFR
Anion gap: 5 (ref 5–15)
BUN: 34 mg/dL — ABNORMAL HIGH (ref 8–23)
CO2: 25 mmol/L (ref 22–32)
Calcium: 9.3 mg/dL (ref 8.9–10.3)
Chloride: 105 mmol/L (ref 98–111)
Creatinine, Ser: 1.28 mg/dL — ABNORMAL HIGH (ref 0.61–1.24)
GFR, Estimated: 59 mL/min — ABNORMAL LOW (ref >60)
Glucose, Bld: 132 mg/dL — ABNORMAL HIGH (ref 70–99)
Potassium: 4.5 mmol/L (ref 3.5–5.1)
Sodium: 135 mmol/L (ref 135–145)

## 2023-06-15 LAB — SURGICAL PCR SCREEN
MRSA, PCR: NEGATIVE
Staphylococcus aureus: NEGATIVE

## 2023-06-18 NOTE — Progress Notes (Signed)
 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 former smoker with h/o HTN, AAA, BPH, right shoulder OA scheduled for above procedure 07/05/2023 with Dr. Sammye Cristal.   Per cardiology preoperative evaluation 06/13/2023, "Preoperative Cardiovascular Risk Assessment: Walter White perioperative risk of a major cardiac event is 0.4% according to the Revised Cardiac Risk Index (RCRI).  Therefore, he is at low risk for perioperative complications.   His functional capacity is good at 7.34 METs according to the Duke Activity Status Index (DASI). Recommendations: According to ACC/AHA guidelines, no further cardiovascular testing needed.  The patient may proceed to surgery at acceptable risk.   Antiplatelet and/or Anticoagulation Recommendations: Regarding ASA therapy, we recommend continuation of ASA throughout the perioperative period.  However, if the surgeon feels that cessation of ASA is required in the perioperative period, it may be stopped 5-7 days prior to surgery with a plan to resume it as soon as felt to be feasible from a surgical standpoint in the post-operative period. "  Difficult intubation in patient history.  No difficulty documented with the last several procedures here.  VS: There were no vitals taken for this visit.  PROVIDERS: [name], MD is PCP   Primary Cardiologist:  [name], DO  LABS: Labs reviewed: Acceptable for surgery. (all labs ordered are listed, but only abnormal results are displayed)  Labs Reviewed - No data to display   IMAGES: VAS US  AAA Duplex  12/20/2022 Summary:  Abdominal Aorta: There is evidence of abnormal dilatation of the proximal  Abdominal aorta. Previous diameter measurement was 3.1 cm obtained on  12/07/2021. Exam of 12/01/2020 revealed the proximal aorta to measure 4.00  cm.   EKG:   CV: Lexiscan   Nuclear stress test 11/14/2021: Prominent diaphragmatic attenuation noted inferior wall. Myocardial perfusion does not reveal ischemia or scar.  Overall LV systolic function is normal without regional wall motion abnormalities. Calculated Stress LV EF: 49%, visually appears normal. Correlate with echocardiogram.  Non-diagnostic ECG stress. The heart rate response was consistent with Regadenoson .  No previous exam available for comparison. Low risk.   Echo 11/07/2021 1. Left ventricular ejection fraction, by estimation, is 55 to 60%. The  left ventricle has normal function. The left ventricle has no regional  wall motion abnormalities. There is moderate left ventricular hypertrophy.  Left ventricular diastolic  parameters were normal.   2. Right ventricular systolic function is normal. The right ventricular  size is normal.   3. The mitral valve is degenerative. No evidence of mitral valve  regurgitation. No evidence of mitral stenosis.   4. The aortic valve is tricuspid. Aortic valve regurgitation is not  visualized. Aortic valve sclerosis is present, with no evidence of aortic  valve stenosis.   5. Aortic dilatation noted. There is mild dilatation of the aortic root,  measuring 39 mm. There is mild dilatation of the ascending aorta,  measuring 41 mm.  Past Medical History:  Diagnosis Date   AAA (abdominal aortic aneurysm) (HCC)    4.0 cm AAA 08/22/17 U/S   Allergy    Allergic Rhinitis  Arthritis    spinal steniosis   Benign prostate hyperplasia    with lower urinary tract symptoms   Carpal tunnel syndrome on right    Cataracts, both eyes    Chronic back pain    caused by spinal stenosis   Elevated PSA     GERD (gastroesophageal reflux disease)    Hyperlipidemia    Hypertension    Essential.     MGUS (monoclonal gammopathy of unknown significance)    Microhematuria    OA (osteoarthritis) of knee    Pneumonia    Hx -when very young   Pre-diabetes    Seasonal allergies    Spinal stenosis    of lumbosacral region.  Causes chronic back pain.   Upper abdominal pain     Past Surgical History:  Procedure Laterality Date   ABDOMINAL EXPOSURE N/A 09/05/2017   Procedure: ABDOMINAL EXPOSURE;  Surgeon: Mayo Speck, MD;  Location: [facility] OR;  Service: Vascular;  Laterality: N/A;   ANTERIOR LAT LUMBAR FUSION Left 01/26/2016   Procedure: LEFT SIDED LATERAL INTERBODY FUSION, LUMBAR 3-4 WITH INSTRUMENTATION AND ALLOGRAFT;  Surgeon: [name], MD;  Location: [facility] OR;  Service: Orthopedics;  Laterality: Left;  LEFT SIDED LATERAL INTERBODY FUSION, LUMBAR 3-4 WITH INSTRUMENTATION AND ALLOGRAFT   ANTERIOR LAT LUMBAR FUSION Right 08/14/2018   Procedure: RIGHT LUMBAR two to LUMBAR three LATERAL INTERBODY FUSION WITH INSTRUMENTATION AND ALLOGRAFT;  Surgeon: [name], MD;  Location: [facility] OR;  Service: Orthopedics;  Laterality: Right;   ANTERIOR LATERAL LUMBAR FUSION WITH PERCUTANEOUS SCREW 1 LEVEL N/A 09/05/2017   Procedure: LUMBAR 5 - SACRUM 1 ANTERIOR LUMBAR INTERBODY FUSION WITH INSTRUMENTATION AND ALLOGRAFT;  Surgeon: [name], MD;  Location: [facility] OR;  Service: Orthopedics;  Laterality: N/A;   BACK SURGERY  01/2015   dr. Jackee Marus. [phone].   CARPAL TUNNEL RELEASE     right    CATARACT EXTRACTION, BILATERAL     COLONOSCOPY     COLONOSCOPY  2010,2016   polyp   ELBOW SURGERY     right ulnar release   ELBOW SURGERY     LEFT CTR   EYE SURGERY     FINGER ARTHROPLASTY Left 04/04/2021   Procedure: LEFT INDEX FINGER IMPLANT ARTHROPLASTY AT METACARPOPHALANGEAL JOINT;  Surgeon: [name], MD;  Location: [facility];  Service: Orthopedics;  Laterality: Left;  PRE-OP BLOCK,   FINGER  ARTHROPLASTY Left 09/16/2021   Procedure: LEFT INDEX FINGER METACARPOPHALANGEAL JOINT REVISION ARTHROPLASTY;  Surgeon: [name], MD;  Location: [facility];  Service: Orthopedics;  Laterality: Left;   JOINT REPLACEMENT     left- Sept 2016   KNEE ARTHROSCOPY  11/23/2010   Procedure: ARTHROSCOPY KNEE;  Surgeon: [name];  Location: [facility];  Service: Orthopedics;  Laterality: Left;  arthroscopy left knee   KNEE ARTHROSCOPY     left x 2   Knee scar tissue removal Left 2018   LATERAL FUSION LUMBAR SPINE  01./17/2018   polypectomy  2010   SHOULDER ARTHROSCOPY Left    x2   TONSILLECTOMY     TOTAL KNEE ARTHROPLASTY Left 09/21/2014   Procedure: TOTAL KNEE ARTHROPLASTY;  Surgeon: [name], MD;  Location: [facility];  Service: Orthopedics;  Laterality: Left;   ULNAR NERVE TRANSPOSITION Left 04/16/2017   Procedure: LEFT ULNAR NEUROPLASTY AT THE WRIST AND TRANSPOSITION AT THE ELBOW;  Surgeon: [name], MD;  Location: [facility];  Service: Orthopedics;  Laterality: Left;   WISDOM TOOTH EXTRACTION  MEDICATIONS: No current facility-administered medications for this encounter.    acetaminophen  (TYLENOL ) 650 MG CR tablet   aspirin  EC (ASPIRIN  ADULT LOW DOSE) 81 MG tablet   Cholecalciferol  (VITAMIN D ) 125 MCG (5000 UT) CAPS   diclofenac (VOLTAREN) 50 MG EC tablet   ezetimibe (ZETIA) 10 MG tablet   famotidine (PEPCID) 20 MG tablet   fluticasone  (FLONASE ) 50 MCG/ACT nasal spray   gabapentin  (NEURONTIN ) 300 MG capsule   HYDROcodone -acetaminophen  (NORCO/VICODIN) 5-325 MG tablet   lisinopril -hydrochlorothiazide  (ZESTORETIC ) 20-25 MG tablet   metFORMIN (GLUCOPHAGE-XR) 500 MG 24 hr tablet   pantoprazole  (PROTONIX ) 40 MG tablet   rosuvastatin  (CRESTOR ) 5 MG tablet    [facility], PA-C [facility] Pre-Surgical Testing [phone]

## 2023-06-19 DIAGNOSIS — M25562 Pain in left knee: Secondary | ICD-10-CM | POA: Diagnosis not present

## 2023-06-19 DIAGNOSIS — S92412A Displaced fracture of proximal phalanx of left great toe, initial encounter for closed fracture: Secondary | ICD-10-CM | POA: Diagnosis not present

## 2023-06-21 DIAGNOSIS — M4316 Spondylolisthesis, lumbar region: Secondary | ICD-10-CM | POA: Diagnosis not present

## 2023-06-26 DIAGNOSIS — S66812A Strain of other specified muscles, fascia and tendons at wrist and hand level, left hand, initial encounter: Secondary | ICD-10-CM | POA: Diagnosis not present

## 2023-06-26 DIAGNOSIS — M4316 Spondylolisthesis, lumbar region: Secondary | ICD-10-CM | POA: Diagnosis not present

## 2023-06-28 DIAGNOSIS — M4316 Spondylolisthesis, lumbar region: Secondary | ICD-10-CM | POA: Diagnosis not present

## 2023-07-03 DIAGNOSIS — M4316 Spondylolisthesis, lumbar region: Secondary | ICD-10-CM | POA: Diagnosis not present

## 2023-07-04 NOTE — Anesthesia Preprocedure Evaluation (Signed)
 Anesthesia Evaluation  Patient identified by MRN, date of birth, ID band Patient awake    Reviewed: Allergy & Precautions, NPO status , Patient's Chart, lab work & pertinent test results  History of Anesthesia Complications Negative for: history of anesthetic complications  Airway Mallampati: I  TM Distance: >3 FB Neck ROM: Full    Dental  (+) Dental Advisory Given   Pulmonary former smoker   breath sounds clear to auscultation       Cardiovascular hypertension, Pt. on medications (-) angina + CAD (calcifications) and + Peripheral Vascular Disease (4.0 AAA)   Rhythm:Regular Rate:Normal  '23 ECHO: EF 55-60%, moderate LVH, normal LVF, normal RVF, no significant valvular abnormalities  '23 Lexiscan  Nuclear stress test:: Low risk   Neuro/Psych  Chronic Back pain    GI/Hepatic Neg liver ROS,GERD  Medicated and Controlled,,  Endo/Other  diabetes (glu 196), Oral Hypoglycemic Agents  BMI 37  Renal/GU Renal InsufficiencyRenal disease     Musculoskeletal  (+) Arthritis ,    Abdominal   Peds  Hematology negative hematology ROS (+)   Anesthesia Other Findings   Reproductive/Obstetrics                             Anesthesia Physical Anesthesia Plan  ASA: 3  Anesthesia Plan: General   Post-op Pain Management: Regional block*   Induction: Intravenous  PONV Risk Score and Plan: 2 and Dexamethasone  and Ondansetron   Airway Management Planned: Oral ETT  Additional Equipment: None  Intra-op Plan:   Post-operative Plan: Extubation in OR  Informed Consent: I have reviewed the patients History and Physical, chart, labs and discussed the procedure including the risks, benefits and alternatives for the proposed anesthesia with the patient or authorized representative who has indicated his/her understanding and acceptance.     Dental advisory given  Plan Discussed with: CRNA and  Surgeon  Anesthesia Plan Comments: (Plan routine monitors, GETA with interscalene block for post op analgesia)       Anesthesia Quick Evaluation

## 2023-07-05 ENCOUNTER — Other Ambulatory Visit: Payer: Self-pay

## 2023-07-05 ENCOUNTER — Encounter (HOSPITAL_COMMUNITY)
Admission: RE | Disposition: A | Discharge status: Home / Self Care | Payer: Self-pay | Source: Ambulatory Visit | Attending: Orthopedic Surgery

## 2023-07-05 ENCOUNTER — Encounter (HOSPITAL_COMMUNITY): Payer: Self-pay | Admitting: Orthopedic Surgery

## 2023-07-05 ENCOUNTER — Ambulatory Visit (HOSPITAL_COMMUNITY)
Admission: RE | Admit: 2023-07-05 | Discharge: 2023-07-05 | Disposition: A | Discharge status: Home / Self Care | Source: Ambulatory Visit | Attending: Orthopedic Surgery | Admitting: Orthopedic Surgery

## 2023-07-05 ENCOUNTER — Ambulatory Visit (HOSPITAL_COMMUNITY): Payer: Self-pay | Admitting: Physician Assistant

## 2023-07-05 DIAGNOSIS — M549 Dorsalgia, unspecified: Secondary | ICD-10-CM | POA: Insufficient documentation

## 2023-07-05 DIAGNOSIS — I2510 Atherosclerotic heart disease of native coronary artery without angina pectoris: Secondary | ICD-10-CM | POA: Diagnosis not present

## 2023-07-05 DIAGNOSIS — Z7984 Long term (current) use of oral hypoglycemic drugs: Secondary | ICD-10-CM | POA: Diagnosis not present

## 2023-07-05 DIAGNOSIS — K219 Gastro-esophageal reflux disease without esophagitis: Secondary | ICD-10-CM | POA: Insufficient documentation

## 2023-07-05 DIAGNOSIS — Z96611 Presence of right artificial shoulder joint: Secondary | ICD-10-CM | POA: Diagnosis not present

## 2023-07-05 DIAGNOSIS — I714 Abdominal aortic aneurysm, without rupture, unspecified: Secondary | ICD-10-CM | POA: Insufficient documentation

## 2023-07-05 DIAGNOSIS — R931 Abnormal findings on diagnostic imaging of heart and coronary circulation: Secondary | ICD-10-CM

## 2023-07-05 DIAGNOSIS — Z79899 Other long term (current) drug therapy: Secondary | ICD-10-CM | POA: Insufficient documentation

## 2023-07-05 DIAGNOSIS — M19011 Primary osteoarthritis, right shoulder: Secondary | ICD-10-CM

## 2023-07-05 DIAGNOSIS — G8918 Other acute postprocedural pain: Secondary | ICD-10-CM | POA: Diagnosis not present

## 2023-07-05 DIAGNOSIS — Z01818 Encounter for other preprocedural examination: Secondary | ICD-10-CM

## 2023-07-05 DIAGNOSIS — Z6835 Body mass index (BMI) 35.0-35.9, adult: Secondary | ICD-10-CM | POA: Diagnosis not present

## 2023-07-05 DIAGNOSIS — E1151 Type 2 diabetes mellitus with diabetic peripheral angiopathy without gangrene: Secondary | ICD-10-CM | POA: Diagnosis not present

## 2023-07-05 DIAGNOSIS — M1990 Unspecified osteoarthritis, unspecified site: Secondary | ICD-10-CM | POA: Insufficient documentation

## 2023-07-05 DIAGNOSIS — M19211 Secondary osteoarthritis, right shoulder: Secondary | ICD-10-CM | POA: Diagnosis not present

## 2023-07-05 DIAGNOSIS — G8929 Other chronic pain: Secondary | ICD-10-CM | POA: Diagnosis not present

## 2023-07-05 DIAGNOSIS — I2584 Coronary atherosclerosis due to calcified coronary lesion: Secondary | ICD-10-CM | POA: Diagnosis not present

## 2023-07-05 DIAGNOSIS — Z87891 Personal history of nicotine dependence: Secondary | ICD-10-CM | POA: Insufficient documentation

## 2023-07-05 DIAGNOSIS — I10 Essential (primary) hypertension: Secondary | ICD-10-CM | POA: Diagnosis not present

## 2023-07-05 HISTORY — PX: REVERSE SHOULDER ARTHROPLASTY: SHX5054

## 2023-07-05 LAB — GLUCOSE, CAPILLARY: Glucose-Capillary: 196 mg/dL — ABNORMAL HIGH (ref 70–99)

## 2023-07-05 SURGERY — ARTHROPLASTY, SHOULDER, TOTAL, REVERSE
Anesthesia: General | Site: Shoulder | Laterality: Right

## 2023-07-05 MED ORDER — BUPIVACAINE LIPOSOME 1.3 % IJ SUSP
INTRAMUSCULAR | Status: DC | PRN
  Administered 2023-07-05: 10 mL via PERINEURAL

## 2023-07-05 MED ORDER — MIDAZOLAM HCL 2 MG/2ML IJ SOLN: 0.5000 mg | Freq: Once | INTRAMUSCULAR | Status: DC | PRN

## 2023-07-05 MED ORDER — ASPIRIN 81 MG PO TBEC
81.0000 mg | DELAYED_RELEASE_TABLET | Freq: Two times a day (BID) | ORAL | Status: AC
Start: 2023-07-05 — End: ?

## 2023-07-05 MED ORDER — SUGAMMADEX SODIUM 200 MG/2ML IV SOLN
INTRAVENOUS | Status: DC | PRN
  Administered 2023-07-05 (×2): 100 mg via INTRAVENOUS

## 2023-07-05 MED ORDER — CEFAZOLIN SODIUM-DEXTROSE 2-4 GM/100ML-% IV SOLN
2.0000 g | INTRAVENOUS | Status: AC
  Administered 2023-07-05: 2 g via INTRAVENOUS
  Filled 2023-07-05: qty 100

## 2023-07-05 MED ORDER — DEXAMETHASONE SODIUM PHOSPHATE 10 MG/ML IJ SOLN
INTRAMUSCULAR | Status: DC | PRN
  Administered 2023-07-05: 10 mg via INTRAVENOUS

## 2023-07-05 MED ORDER — LIDOCAINE HCL (CARDIAC) PF 100 MG/5ML IV SOSY
PREFILLED_SYRINGE | INTRAVENOUS | Status: DC | PRN
  Administered 2023-07-05: 20 mg via INTRAVENOUS

## 2023-07-05 MED ORDER — ROCURONIUM BROMIDE 100 MG/10ML IV SOLN
INTRAVENOUS | Status: DC | PRN
  Administered 2023-07-05: 60 mg via INTRAVENOUS

## 2023-07-05 MED ORDER — FENTANYL CITRATE PF 50 MCG/ML IJ SOSY
50.0000 ug | PREFILLED_SYRINGE | Freq: Once | INTRAMUSCULAR | Status: AC
  Administered 2023-07-05: 50 ug via INTRAVENOUS
  Filled 2023-07-05: qty 2

## 2023-07-05 MED ORDER — ACETAMINOPHEN 500 MG PO TABS
1000.0000 mg | ORAL_TABLET | Freq: Once | ORAL | Status: AC
  Administered 2023-07-05: 1000 mg via ORAL
  Filled 2023-07-05: qty 2

## 2023-07-05 MED ORDER — OXYCODONE HCL 5 MG PO TABS: 5.0000 mg | ORAL_TABLET | Freq: Once | ORAL | Status: DC | PRN

## 2023-07-05 MED ORDER — ORAL CARE MOUTH RINSE: 15.0000 mL | Freq: Once | OROMUCOSAL | Status: AC

## 2023-07-05 MED ORDER — PHENYLEPHRINE 80 MCG/ML (10ML) SYRINGE FOR IV PUSH (FOR BLOOD PRESSURE SUPPORT)
PREFILLED_SYRINGE | INTRAVENOUS | Status: DC | PRN
  Administered 2023-07-05 (×3): 160 ug via INTRAVENOUS

## 2023-07-05 MED ORDER — LACTATED RINGERS IV SOLN: INTRAVENOUS | Status: DC

## 2023-07-05 MED ORDER — ONDANSETRON HCL 4 MG/2ML IJ SOLN
INTRAMUSCULAR | Status: AC
  Filled 2023-07-05: qty 2

## 2023-07-05 MED ORDER — OXYCODONE HCL 5 MG/5ML PO SOLN: 5.0000 mg | Freq: Once | ORAL | Status: DC | PRN

## 2023-07-05 MED ORDER — HYDROMORPHONE HCL 1 MG/ML IJ SOLN: 0.2500 mg | INTRAMUSCULAR | Status: DC | PRN

## 2023-07-05 MED ORDER — TRANEXAMIC ACID-NACL 1000-0.7 MG/100ML-% IV SOLN
1000.0000 mg | INTRAVENOUS | Status: AC
  Administered 2023-07-05: 1000 mg via INTRAVENOUS
  Filled 2023-07-05: qty 100

## 2023-07-05 MED ORDER — SODIUM CHLORIDE 0.9 % IR SOLN
Status: DC | PRN
  Administered 2023-07-05: 1000 mL

## 2023-07-05 MED ORDER — PHENYLEPHRINE HCL-NACL 20-0.9 MG/250ML-% IV SOLN
INTRAVENOUS | Status: DC | PRN
  Administered 2023-07-05: 30 ug/min via INTRAVENOUS

## 2023-07-05 MED ORDER — OXYCODONE HCL 5 MG PO TABS: 5.0000 mg | ORAL_TABLET | ORAL | 0 refills | Status: AC | PRN

## 2023-07-05 MED ORDER — EPHEDRINE SULFATE-NACL 50-0.9 MG/10ML-% IV SOSY
PREFILLED_SYRINGE | INTRAVENOUS | Status: DC | PRN
  Administered 2023-07-05 (×3): 5 mg via INTRAVENOUS

## 2023-07-05 MED ORDER — PROPOFOL 10 MG/ML IV BOLUS
INTRAVENOUS | Status: DC | PRN
  Administered 2023-07-05: 150 mg via INTRAVENOUS

## 2023-07-05 MED ORDER — ROCURONIUM BROMIDE 10 MG/ML (PF) SYRINGE
PREFILLED_SYRINGE | INTRAVENOUS | Status: AC
Start: 2023-07-05 — End: 2023-07-05
  Filled 2023-07-05: qty 10

## 2023-07-05 MED ORDER — PHENYLEPHRINE HCL-NACL 20-0.9 MG/250ML-% IV SOLN
INTRAVENOUS | Status: AC
  Filled 2023-07-05: qty 250

## 2023-07-05 MED ORDER — BUPIVACAINE-EPINEPHRINE (PF) 0.5% -1:200000 IJ SOLN
INTRAMUSCULAR | Status: DC | PRN
Start: 2023-07-05 — End: 2023-07-05
  Administered 2023-07-05: 10 mL via PERINEURAL

## 2023-07-05 MED ORDER — MIDAZOLAM HCL 2 MG/2ML IJ SOLN
1.0000 mg | Freq: Once | INTRAMUSCULAR | Status: AC
  Administered 2023-07-05: 1 mg via INTRAVENOUS
  Filled 2023-07-05: qty 2

## 2023-07-05 MED ORDER — ONDANSETRON HCL 4 MG/2ML IJ SOLN
INTRAMUSCULAR | Status: DC | PRN
  Administered 2023-07-05: 4 mg via INTRAVENOUS

## 2023-07-05 MED ORDER — LIDOCAINE HCL (PF) 2 % IJ SOLN
INTRAMUSCULAR | Status: AC
Start: 2023-07-05 — End: 2023-07-05
  Filled 2023-07-05: qty 5

## 2023-07-05 MED ORDER — DEXAMETHASONE SODIUM PHOSPHATE 10 MG/ML IJ SOLN
INTRAMUSCULAR | Status: AC
Start: 2023-07-05 — End: 2023-07-05
  Filled 2023-07-05: qty 1

## 2023-07-05 MED ORDER — TIZANIDINE HCL 4 MG PO TABS
4.0000 mg | ORAL_TABLET | Freq: Three times a day (TID) | ORAL | 1 refills | Status: AC | PRN
Start: 2023-07-05 — End: 2024-07-04

## 2023-07-05 MED ORDER — PROPOFOL 10 MG/ML IV BOLUS
INTRAVENOUS | Status: AC
  Filled 2023-07-05: qty 20

## 2023-07-05 MED ORDER — WATER FOR IRRIGATION, STERILE IR SOLN
Status: DC | PRN
Start: 2023-07-05 — End: 2023-07-05
  Administered 2023-07-05: 1000 mL

## 2023-07-05 MED ORDER — CHLORHEXIDINE GLUCONATE 0.12 % MT SOLN
15.0000 mL | Freq: Once | OROMUCOSAL | Status: AC
  Administered 2023-07-05: 15 mL via OROMUCOSAL

## 2023-07-05 MED ORDER — POVIDONE-IODINE 7.5 % EX SOLN: Freq: Once | CUTANEOUS | Status: DC

## 2023-07-05 MED ORDER — 0.9 % SODIUM CHLORIDE (POUR BTL) OPTIME
TOPICAL | Status: DC | PRN
  Administered 2023-07-05: 1000 mL

## 2023-07-05 SURGICAL SUPPLY — 65 items
BAG COUNTER SPONGE SURGICOUNT (BAG) IMPLANT
BAG ZIPLOCK 12X15 (MISCELLANEOUS) ×1 IMPLANT
BASEPLATE P2 COATD GLND 6.5X30 (Shoulder) IMPLANT
BIT DRILL 2.5 DIA 127 CALI (BIT) IMPLANT
BIT DRILL 4 DIA CALIBRATED (BIT) IMPLANT
BLADE SAW SGTL 73X25 THK (BLADE) ×1 IMPLANT
BOOTIES KNEE HIGH SLOAN (MISCELLANEOUS) ×2 IMPLANT
COOLER ICEMAN CLASSIC (MISCELLANEOUS) ×1 IMPLANT
COVER BACK TABLE 60X90IN (DRAPES) ×1 IMPLANT
COVER SURGICAL LIGHT HANDLE (MISCELLANEOUS) ×1 IMPLANT
DRAPE INCISE IOBAN 66X45 STRL (DRAPES) ×1 IMPLANT
DRAPE POUCH INSTRU U-SHP 10X18 (DRAPES) ×1 IMPLANT
DRAPE SHEET LG 3/4 BI-LAMINATE (DRAPES) ×1 IMPLANT
DRAPE SURG 17X11 SM STRL (DRAPES) ×1 IMPLANT
DRAPE SURG ORHT 6 SPLT 77X108 (DRAPES) ×2 IMPLANT
DRAPE TOP 10253 STERILE (DRAPES) ×1 IMPLANT
DRAPE U-SHAPE 47X51 STRL (DRAPES) ×1 IMPLANT
DRSG AQUACEL AG ADV 3.5X 6 (GAUZE/BANDAGES/DRESSINGS) ×1 IMPLANT
DRSG AQUACEL AG ADV 3.5X10 (GAUZE/BANDAGES/DRESSINGS) IMPLANT
DURAPREP 26ML APPLICATOR (WOUND CARE) ×2 IMPLANT
ELECT BLADE TIP CTD 4 INCH (ELECTRODE) ×1 IMPLANT
ELECT PENCIL ROCKER SW 15FT (MISCELLANEOUS) ×1 IMPLANT
ELECT REM PT RETURN 15FT ADLT (MISCELLANEOUS) ×1 IMPLANT
FACESHIELD WRAPAROUND (MASK) ×1 IMPLANT
FACESHIELD WRAPAROUND OR TEAM (MASK) ×1 IMPLANT
GLOVE BIO SURGEON STRL SZ7.5 (GLOVE) ×1 IMPLANT
GLOVE BIOGEL PI IND STRL 6.5 (GLOVE) ×1 IMPLANT
GLOVE BIOGEL PI IND STRL 8 (GLOVE) ×1 IMPLANT
GLOVE SURG SS PI 6.5 STRL IVOR (GLOVE) ×1 IMPLANT
GOWN STRL REUS W/ TWL LRG LVL3 (GOWN DISPOSABLE) ×1 IMPLANT
GOWN STRL REUS W/ TWL XL LVL3 (GOWN DISPOSABLE) ×1 IMPLANT
HEAD GLENOID W/SCREW 32MM (Shoulder) IMPLANT
HOOD PEEL AWAY T7 (MISCELLANEOUS) ×3 IMPLANT
INSERT EPOLYSTD HUMERUS+4 36 (Shoulder) IMPLANT
KIT BASIN OR (CUSTOM PROCEDURE TRAY) ×1 IMPLANT
KIT TURNOVER KIT A (KITS) ×1 IMPLANT
MANIFOLD NEPTUNE II (INSTRUMENTS) ×1 IMPLANT
NDL TROCAR POINT SZ 2 1/2 (NEEDLE) IMPLANT
NEEDLE TROCAR POINT SZ 2 1/2 (NEEDLE) ×1 IMPLANT
NS IRRIG 1000ML POUR BTL (IV SOLUTION) ×1 IMPLANT
PACK SHOULDER (CUSTOM PROCEDURE TRAY) ×1 IMPLANT
PAD COLD SHLDR WRAP-ON (PAD) ×1 IMPLANT
RESTRAINT HEAD UNIVERSAL NS (MISCELLANEOUS) ×1 IMPLANT
RETRIEVER SUT HEWSON (MISCELLANEOUS) IMPLANT
SCREW BONE RSP LOCK 5X14 (Screw) IMPLANT
SCREW BONE RSP LOCK 5X18 (Screw) IMPLANT
SCREW BONE RSP LOCK 5X26 (Screw) IMPLANT
SCREW BONE RSP LOCK 5X30 (Screw) IMPLANT
SET HNDPC FAN SPRY TIP SCT (DISPOSABLE) ×1 IMPLANT
SLING ARM FOAM STRAP LRG (SOFTGOODS) IMPLANT
SLING ARM FOAM STRAP MED (SOFTGOODS) IMPLANT
STEM HUMERAL STD SHELL 14X48 (Miscellaneous) IMPLANT
STRIP CLOSURE SKIN 1/2X4 (GAUZE/BANDAGES/DRESSINGS) ×1 IMPLANT
SUCTION TUBE FRAZIER 10FR DISP (SUCTIONS) IMPLANT
SUPPORT WRAP ARM LG (MISCELLANEOUS) ×1 IMPLANT
SUT ETHIBOND 2 V 37 (SUTURE) IMPLANT
SUT MNCRL AB 4-0 PS2 18 (SUTURE) ×1 IMPLANT
SUT VIC AB 2-0 CT1 TAPERPNT 27 (SUTURE) ×2 IMPLANT
SUTURE FIBERWR#2 38 REV NDL BL (SUTURE) IMPLANT
TAP SURG THRD DJ 6.5 (ORTHOPEDIC DISPOSABLE SUPPLIES) IMPLANT
TAPE LABRALWHITE 1.5X36 (TAPE) IMPLANT
TAPE SUT LABRALTAP WHT/BLK (SUTURE) IMPLANT
TOWEL OR 17X26 10 PK STRL BLUE (TOWEL DISPOSABLE) ×1 IMPLANT
TUBE SUCTION HIGH CAP CLEAR NV (SUCTIONS) ×1 IMPLANT
WATER STERILE IRR 1000ML POUR (IV SOLUTION) ×1 IMPLANT

## 2023-07-05 NOTE — Anesthesia Postprocedure Evaluation (Signed)
 Anesthesia Post Note  Patient: Walter White.  Procedure(s) Performed: ARTHROPLASTY, SHOULDER, TOTAL, REVERSE (Right: Shoulder)     Patient location during evaluation: PACU Anesthesia Type: General Level of consciousness: awake and alert, oriented and patient cooperative Pain management: pain level controlled Vital Signs Assessment: post-procedure vital signs reviewed and stable Respiratory status: spontaneous breathing, nonlabored ventilation and respiratory function stable Cardiovascular status: blood pressure returned to baseline and stable Postop Assessment: no apparent nausea or vomiting, adequate PO intake and able to ambulate Anesthetic complications: no  No notable events documented.  Last Vitals:  Vitals:   07/05/23 1145 07/05/23 1215  BP: (!) 141/85 124/79  Pulse: 86 75  Resp: (!) 22 16  Temp: 36.6 C   SpO2: 92% 94%    Last Pain:  Vitals:   07/05/23 1145  TempSrc:   PainSc: 0-No pain                 Tommye Lehenbauer,E. Kasarah Sitts

## 2023-07-05 NOTE — Op Note (Signed)
 Procedure(s): ARTHROPLASTY, SHOULDER, TOTAL, REVERSE Procedure Note  Walter White. male 73 y.o. 07/05/2023  Preoperative diagnosis: Right shoulder advanced arthritis with rotator cuff disease  Postoperative diagnosis: Same  Procedure(s) and Anesthesia Type:    * ARTHROPLASTY, SHOULDER, TOTAL, REVERSE - General   Indications:  73 y.o. male status post arthroscopic debridement of chondromalacia and partial rotator cuff tear who has gone on to fail with ongoing pain despite adequate rehab and conservative management with injections rest anti-inflammatories PT etc.  Ultimately indicated for reverse total shoulder arthroplasty to try and decrease pain and restore function.     Surgeon: Josefa LELON Herring   Assistants: Jeoffrey Northern PA-C Amber was present and scrubbed throughout the procedure and was essential in positioning, retraction, exposure, and closure)  Anesthesia: General endotracheal anesthesia with preoperative interscalene block given by the attending anesthesiologist    Procedure Detail  ARTHROPLASTY, SHOULDER, TOTAL, REVERSE   Estimated Blood Loss:  200 mL         Drains: none  Blood Given: none          Specimens: none        Complications:  * No complications entered in OR log *         Disposition: PACU - hemodynamically stable.         Condition: stable      OPERATIVE FINDINGS:  A DJO Altivate pressfit reverse total shoulder arthroplasty was placed with a  size 14 stem, a 36 standard glenosphere, and a +4-mm poly insert. The base plate  fixation was excellent.  PROCEDURE: The patient was identified in the preoperative holding area  where I personally marked the operative site after verifying site, side,  and procedure with the patient. An interscalene block given by  the attending anesthesiologist in the holding area and the patient was taken back to the operating room where all extremities were  carefully padded in position after general  anesthesia was induced. She  was placed in a beach-chair position and the operative upper extremity was  prepped and draped in a standard sterile fashion. An approximately 10-  cm incision was made from the tip of the coracoid process to the center  point of the humerus at the level of the axilla. Dissection was carried  down through subcutaneous tissues to the level of the cephalic vein  which was taken laterally with the deltoid. The pectoralis major was  retracted medially. The subdeltoid space was developed and the lateral  edge of the conjoined tendon was identified. The undersurface of  conjoined tendon was palpated and the musculocutaneous nerve was not in  the field. Retractor was placed underneath the conjoined and second  retractor was placed lateral into the deltoid. The circumflex humeral  artery and vessels were identified and clamped and coagulated. The  biceps tendon was absent.  The subscapularis was taken down as a peel.  The  joint was then gently externally rotated while the capsule was released  from the humeral neck around to just beyond the 6 o'clock position. At  this point, the joint was dislocated and the humeral head was presented  into the wound. The excessive osteophyte formation was removed with a  large rongeur.  The cutting guide was used to make the appropriate  head cut and the head was saved for potentially bone grafting.  The glenoid was exposed with the arm in an  abducted extended position. The anterior and posterior labrum were  completely excised and the capsule  was released circumferentially to  allow for exposure of the glenoid for preparation. The 2.5 mm drill was  placed using the guide in 5-10 inferior angulation and the tap was then advanced in the same hole. Small and large reamers were then used. The tap was then removed and the Metaglene was then screwed in with excellent purchase.  The peripheral guide was then used to drilled measured and  filled peripheral locking screws. The size 36 standard glenosphere was then impacted on the [facility] taper and the central screw was placed. The humerus was then again exposed and the diaphyseal reamers were used followed by the metaphyseal reamers. The final broach was left in place in the proximal trial was placed. The joint was reduced and with this implant it was felt that soft tissue tensioning was appropriate with excellent stability and excellent range of motion. Therefore, final humeral stem was placed press-fit.  And then the trial polyethylene inserts were tested again and the above implant was felt to be the most appropriate for final insertion. The joint was reduced taken through full range of motion and felt to be stable. Soft tissue tension was appropriate.  The joint was then copiously irrigated with pulse  lavage and the wound was then closed. The subscapularis was repaired with labral tapes passed through drill tunnels and around the implant prior to implantation.  Skin was closed with 2-0 Vicryl in a deep dermal layer and 4-0  Monocryl for skin closure. Steri-Strips were applied. Sterile  dressings were then applied as well as a sling. The patient was allowed  to awaken from general anesthesia, transferred to stretcher, and taken  to recovery room in stable condition.   POSTOPERATIVE PLAN: The patient will be observed in the recovery room and if pain is well-controlled with regional anesthesia and he is hemodynamically stable he can be discharged home today with family.

## 2023-07-05 NOTE — Discharge Instructions (Signed)
 Discharge Instructions after Reverse Total Shoulder Arthroplasty   A sling has been provided for you. You are to wear this at all times (except for bathing and dressing), until your first post operative visit with Dr. Ave Filter. Please also wear while sleeping at night. While you bath and dress, let the arm/elbow extend straight down to stretch your elbow. Wiggle your fingers and pump your first while your in the sling to prevent hand swelling. Use ice on the shoulder intermittently over the first 48 hours after surgery. Continue to use ice or and ice machine as needed after 48 hours for pain control/swelling.  Pain medicine has been prescribed for you.  Use your medicine liberally over the first 48 hours, and then you can begin to taper your use. You may take Extra Strength Tylenol or Tylenol only in place of the pain pills. DO NOT take ANY nonsteroidal anti-inflammatory pain medications: Advil, Motrin, Ibuprofen, Aleve, Naproxen or Naprosyn.  Take one aspirin a day for 2 weeks after surgery, unless you have an aspirin sensitivity/allergy or asthma.  Leave your dressing on until your first follow up visit.  You may shower with the dressing.  Hold your arm as if you still have your sling on while you shower. Simply allow the water to wash over the site and then pat dry. Make sure your axilla (armpit) is completely dry after showering.    Please call [phone] during normal business hours or [phone] after hours for any problems. Including the following:  - excessive redness of the incisions - drainage for more than 4 days - fever of more than 101.5 F  *Please note that pain medications will not be refilled after hours or on weekends.    Dental Antibiotics:  In most cases prophylactic antibiotics for Dental procdeures after total joint surgery are not necessary.  Exceptions are as follows:  1. History of prior total joint infection  2. Severely immunocompromised (Organ Transplant,  cancer chemotherapy, Rheumatoid biologic meds such as Humera)  3. Poorly controlled diabetes (A1C &gt; 8.0, blood glucose over 200)  If you have one of these conditions, contact your surgeon for an antibiotic prescription, prior to your dental procedure.

## 2023-07-05 NOTE — Evaluation (Signed)
 Occupational Therapy Evaluation Patient Details Name: Walter White. MRN: [id number] DOB: April 08, 1950 Today's Date: 07/05/2023   History of Present Illness   Mr. Walkowiak is a 73 yr old male who is s/p a R reverse total shoulder arthroplasty on 07-05-23, due to shoulder advanced arthritis with rotator cuff disease.     Clinical Impressions Pt is s/p shoulder replacement of right dominant upper extremity on 07-05-23. Therapist provided education and instruction to patient and his spouse with regards to ROM/exercises, post-op precautions, sling wear schedule, UE and sling positioning, donning upper extremity clothing, recommendations for bathing while maintaining shoulder precautions, use of ice for pain and edema management, NWB status, use of ice machine, and correctly donning/doffing sling. Patient and spouse verbalized and demonstrated understanding as needed. Patient needed assistance to donn shirt, underwear, pants, socks and shoes, with instruction on compensatory strategies to perform ADLs. Patient to follow up with MD for further therapy needs.       If plan is discharge home, recommend the following:   Help with stairs or ramp for entrance;Assistance with cooking/housework;A little help with bathing/dressing/bathroom;Assist for transportation     Functional Status Assessment   Patient has had a recent decline in their functional status and demonstrates the ability to make significant improvements in function in a reasonable and predictable amount of time.     Equipment Recommendations   None recommended by OT     Recommendations for Other Services         Precautions/Restrictions   Precautions Precautions: Shoulder Shoulder Interventions: Shoulder sling/immobilizer Precaution Booklet Issued: Yes (comment) Required Braces or Orthoses: Sling Restrictions Weight Bearing Restrictions Per Provider Order: Yes RUE Weight Bearing Per Provider Order: Non weight  bearing Other Position/Activity Restrictions: okay to perform elbow, wrist, and hand ROM, no shoulder ROM, sling to be worn at all times except ADLs/exercise     Mobility   Transfers Overall transfer level: Needs assistance   Transfers: Sit to/from Stand Sit to Stand: Contact guard assist                  Balance Overall balance assessment: No apparent balance deficits (not formally assessed)           ADL either performed or assessed with clinical judgement   ADL Overall ADL's : Needs assistance/impaired                 Upper Body Dressing : Moderate assistance;Sitting   Lower Body Dressing: Moderate assistance;Sitting/lateral leans;Sit to/from stand                                    Pertinent Vitals/Pain Pain Assessment Pain Assessment: No/denies pain     Extremity/Trunk Assessment Upper Extremity Assessment Upper Extremity Assessment: Right hand dominant   Lower Extremity Assessment Lower Extremity Assessment: Overall WFL for tasks assessed       Communication Communication Communication: No apparent difficulties   Cognition Arousal: Alert Behavior During Therapy: WFL for tasks assessed/performed               OT - Cognition Comments: Oriented x4                 Following commands: Intact             Shoulder Instructions Shoulder Instructions Donning/doffing shirt without moving shoulder: Moderate assistance Method for sponge bathing under operated UE:  (Pt and caregiver verbalized understanding) Donning/doffing  sling/immobilizer: Moderate assistance Correct positioning of sling/immobilizer: Moderate assistance Pendulum exercises (written home exercise program):  (N/A) ROM for elbow, wrist and digits of operated UE:  (Pt and caregiver verbalized understanding) Sling wearing schedule (on at all times/off for ADL's):  (Pt and caregiver verbalized understanding) Proper positioning of operated UE when  showering:  (Pt and caregiver verbalized understanding) Dressing change:  (N/A) Positioning of UE while sleeping:  (Pt and caregiver verbalized understanding)    Home Living Family/patient expects to be discharged to:: Private residence Living Arrangements: Spouse/significant other Available Help at Discharge: Family Type of Home: House Home Access: Stairs to enter Secretary/administrator of Steps: 1   Home Layout: One level     Bathroom Shower/Tub: Walk-in shower         Home Equipment: Agricultural consultant (2 wheels);Cane - single point;BSC/3in1          Prior Functioning/Environment Prior Level of Function : Independent/Modified Independent;Driving             Mobility Comments: He was independent with ambulation. ADLs Comments: He was independent with ADLs, driving, and sharing the cleaning with his spouse.    OT Problem List: Impaired UE functional use;Decreased range of motion        OT Goals(Current goals can be found in the care plan section)   Acute Rehab OT Goals OT Goal Formulation: All assessment and education complete, DC therapy   OT Frequency:   N/A       AM-PAC OT 6 Clicks Daily Activity     Outcome Measure Help from another person eating meals?: None Help from another person taking care of personal grooming?: A Little Help from another person toileting, which includes using toliet, bedpan, or urinal?: A Little Help from another person bathing (including washing, rinsing, drying)?: A Little Help from another person to put on and taking off regular upper body clothing?: A Lot Help from another person to put on and taking off regular lower body clothing?: A Lot 6 Click Score: 17   End of Session Equipment Utilized During Treatment: Other (comment) (N/A) Nurse Communication: Other (comment)  Activity Tolerance: Patient tolerated treatment well Patient left: in chair;with call bell/phone within reach;with family/visitor present  OT Visit  Diagnosis: Muscle weakness (generalized) (M62.81)                Time: 8759-8687 OT Time Calculation (min): 32 min Charges:  OT General Charges $OT Visit: 1 Visit OT Evaluation $OT Eval Moderate Complexity: 1 Mod OT Treatments $Self Care/Home Management : 8-22 mins    Delanna LITTIE Molt, OTR/L 07/05/2023, 4:10 PM

## 2023-07-05 NOTE — Anesthesia Procedure Notes (Signed)
 Procedure Name: Intubation Date/Time: 07/05/2023 9:29 AM  Performed by: Nada Corean CROME, CRNAPre-anesthesia Checklist: Patient identified, Emergency Drugs available, Suction available, Patient being monitored and Timeout performed Patient Re-evaluated:Patient Re-evaluated prior to induction Oxygen Delivery Method: Circle system utilized Preoxygenation: Pre-oxygenation with 100% oxygen Induction Type: IV induction Ventilation: Mask ventilation without difficulty and Two handed mask ventilation required Laryngoscope Size: Mac and 4 Grade View: Grade I Tube type: Oral Tube size: 7.0 mm Number of attempts: 1 Airway Equipment and Method: Stylet Placement Confirmation: ETT inserted through vocal cords under direct vision, positive ETCO2 and breath sounds checked- equal and bilateral Secured at: 22 cm Tube secured with: Tape Dental Injury: Teeth and Oropharynx as per pre-operative assessment

## 2023-07-05 NOTE — Anesthesia Procedure Notes (Signed)
 Anesthesia Regional Block: Interscalene brachial plexus block   Pre-Anesthetic Checklist: , timeout performed,  Correct Patient, Correct Site, Correct Laterality,  Correct Procedure, Correct Position, site marked,  Risks and benefits discussed,  Surgical consent,  Pre-op evaluation,  At surgeon's request and post-op pain management  Laterality: Right and Upper  Prep: chloraprep       Needles:  Injection technique: Single-shot  Needle Type: Echogenic Needle     Needle Length: 9cm  Needle Gauge: 21     Additional Needles:   Procedures:,,,, ultrasound used (permanent image in chart),,    Narrative:  Start time: 07/05/2023 8:49 AM End time: 07/05/2023 8:55 AM Injection made incrementally with aspirations every 5 mL.  Performed by: Personally  Anesthesiologist: Leonce Athens, MD  Additional Notes: Pt identified in Holding room.  Monitors applied. Working IV access confirmed. Timeout, Sterile prep R clavicle and neck.  #21ga ECHOgenic Arrow block needle to interscalene brachial plexus with US  guidance.  10cc 0.5% Bupivacaine  1:200 epi, Exparel  injected incrementally after negative test dose.  Patient asymptomatic, VSS, no heme aspirated, tolerated well.  JAYSON Leonce, MD

## 2023-07-05 NOTE — H&P (Signed)
 Walter White. is an 73 y.o. male.   Chief Complaint: R shoulder pain and dysfunction. HPI: S/p arthroscopic debridement chrondromalacia and partial RCT with incomplete pain relief failed extensive conservative management.  Past Medical History:  Diagnosis Date   AAA (abdominal aortic aneurysm) (HCC)    4.0 cm AAA 08/22/17 U/S   Allergy    Allergic Rhinitis   Arthritis    spinal steniosis   Benign prostate hyperplasia    with lower urinary tract symptoms   Carpal tunnel syndrome on right    Cataracts, both eyes    Chronic back pain    caused by spinal stenosis   Elevated PSA    GERD (gastroesophageal reflux disease)    Hyperlipidemia    Hypertension    Essential.     MGUS (monoclonal gammopathy of unknown significance)    Microhematuria    OA (osteoarthritis) of knee    Pneumonia    Hx -when very young   Pre-diabetes    Seasonal allergies    Spinal stenosis    of lumbosacral region.  Causes chronic back pain.   Upper abdominal pain     Past Surgical History:  Procedure Laterality Date   ABDOMINAL EXPOSURE N/A 09/05/2017   Procedure: ABDOMINAL EXPOSURE;  Surgeon: Oris Krystal FALCON, MD;  Location: [facility] OR;  Service: Vascular;  Laterality: N/A;   ANTERIOR LAT LUMBAR FUSION Left 01/26/2016   Procedure: LEFT SIDED LATERAL INTERBODY FUSION, LUMBAR 3-4 WITH INSTRUMENTATION AND ALLOGRAFT;  Surgeon: Oneil Priestly, MD;  Location: [facility] OR;  Service: Orthopedics;  Laterality: Left;  LEFT SIDED LATERAL INTERBODY FUSION, LUMBAR 3-4 WITH INSTRUMENTATION AND ALLOGRAFT   ANTERIOR LAT LUMBAR FUSION Right 08/14/2018   Procedure: RIGHT LUMBAR two to LUMBAR three LATERAL INTERBODY FUSION WITH INSTRUMENTATION AND ALLOGRAFT;  Surgeon: Priestly Oneil, MD;  Location: [facility] OR;  Service: Orthopedics;  Laterality: Right;   ANTERIOR LATERAL LUMBAR FUSION WITH PERCUTANEOUS SCREW 1 LEVEL N/A 09/05/2017   Procedure: LUMBAR 5 - SACRUM 1 ANTERIOR LUMBAR INTERBODY FUSION WITH INSTRUMENTATION AND ALLOGRAFT;  Surgeon:  Priestly Oneil, MD;  Location: [facility] OR;  Service: Orthopedics;  Laterality: N/A;   BACK SURGERY  01/2015   dr. priestly. [id number].   CARPAL TUNNEL RELEASE     right    CATARACT EXTRACTION, BILATERAL     COLONOSCOPY     COLONOSCOPY  2010,2016   polyp   ELBOW SURGERY     right ulnar release   ELBOW SURGERY     LEFT CTR   EYE SURGERY     FINGER ARTHROPLASTY Left 04/04/2021   Procedure: LEFT INDEX FINGER IMPLANT ARTHROPLASTY AT METACARPOPHALANGEAL JOINT;  Surgeon: Sebastian Lenis, MD;  Location: [facility];  Service: Orthopedics;  Laterality: Left;  PRE-OP BLOCK,   FINGER ARTHROPLASTY Left 09/16/2021   Procedure: LEFT INDEX FINGER METACARPOPHALANGEAL JOINT REVISION ARTHROPLASTY;  Surgeon: Sebastian Lenis, MD;  Location: [facility];  Service: Orthopedics;  Laterality: Left;   JOINT REPLACEMENT     left- Sept 2016   KNEE ARTHROSCOPY  11/23/2010   Procedure: ARTHROSCOPY KNEE;  Surgeon: Dempsey JINNY Sensor;  Location: [facility];  Service: Orthopedics;  Laterality: Left;  arthroscopy left knee   KNEE ARTHROSCOPY     left x 2   Knee scar tissue removal Left 2018   LATERAL FUSION LUMBAR SPINE  01./17/2018   polypectomy  2010   SHOULDER ARTHROSCOPY Left    x2   TONSILLECTOMY     TOTAL KNEE ARTHROPLASTY Left  09/21/2014   Procedure: TOTAL KNEE ARTHROPLASTY;  Surgeon: Dempsey Sensor, MD;  Location: [facility] OR;  Service: Orthopedics;  Laterality: Left;   ULNAR NERVE TRANSPOSITION Left 04/16/2017   Procedure: LEFT ULNAR NEUROPLASTY AT THE WRIST AND TRANSPOSITION AT THE ELBOW;  Surgeon: Sebastian Lenis, MD;  Location: [facility];  Service: Orthopedics;  Laterality: Left;   WISDOM TOOTH EXTRACTION      Family History  Problem Relation Age of Onset   Cancer Mother    Liver disease Father    Colon cancer Maternal Uncle 34   Colon polyps Neg Hx    Esophageal cancer Neg Hx    Rectal cancer Neg Hx    Stomach cancer Neg Hx    Social History:  reports that  he quit smoking about 42 years ago. His smoking use included cigarettes. He started smoking about 52 years ago. He has a 5 pack-year smoking history. He has never used smokeless tobacco. He reports that he does not currently use alcohol. He reports that he does not use drugs.  Allergies:  Allergies  Allergen Reactions   Pork-Derived Products Shortness Of Breath   Shellfish Allergy Shortness Of Breath   Lovastatin Other (See Comments)    Bone pain    Penicillins     UNSPECIFIED CHILDHOOD REACTION  Has patient had a PCN reaction causing immediate rash, facial/tongue/throat swelling, SOB or lightheadedness with hypotension: Unknown Has patient had a PCN reaction causing severe rash involving mucus membranes or skin necrosis: Unknown Has patient had a PCN reaction that required hospitalization: Unknown Has patient had a PCN reaction occurring within the last 10 years: No If all of the above answers are NO, then may proceed with Cephalosporin use.     Medications Prior to Admission  Medication Sig Dispense Refill   acetaminophen  (TYLENOL ) 650 MG CR tablet Take 650 mg by mouth every 8 (eight) hours as needed for pain.     aspirin  EC (ASPIRIN  ADULT LOW DOSE) 81 MG tablet TAKE 1 TABLET BY MOUTH DAILY *SWALLOW WHOLE* (Patient taking differently: Take 81 mg by mouth daily with supper.) 120 tablet 0   Cholecalciferol  (VITAMIN D ) 125 MCG (5000 UT) CAPS Take 5,000 Units by mouth daily with supper.     diclofenac (VOLTAREN) 50 MG EC tablet Take 50 mg by mouth in the morning.     ezetimibe (ZETIA) 10 MG tablet Take 10 mg by mouth daily with supper.     famotidine (PEPCID) 20 MG tablet Take 20 mg by mouth at bedtime.     fluticasone  (FLONASE ) 50 MCG/ACT nasal spray Place 1-2 sprays into both nostrils daily as needed for allergies or rhinitis.     gabapentin  (NEURONTIN ) 300 MG capsule Take 600 mg by mouth at bedtime.     HYDROcodone -acetaminophen  (NORCO/VICODIN) 5-325 MG tablet Take 1 tablet by mouth  in the morning, at noon, and at bedtime.     lisinopril -hydrochlorothiazide  (ZESTORETIC ) 20-25 MG tablet Take 1 tablet by mouth in the morning.     metFORMIN (GLUCOPHAGE-XR) 500 MG 24 hr tablet Take 500 mg by mouth daily with supper.     pantoprazole  (PROTONIX ) 40 MG tablet Take 40 mg by mouth in the morning.     rosuvastatin  (CRESTOR ) 5 MG tablet Take 5 mg by mouth daily with supper.      Results for orders placed or performed during the hospital encounter of 07/05/23 (from the past 48 hours)  Glucose, capillary     Status: Abnormal   Collection Time:  07/05/23  8:25 AM  Result Value Ref Range   Glucose-Capillary 196 (H) 70 - 99 mg/dL    Comment: Glucose reference range applies only to samples taken after fasting for at least 8 hours.   Comment 1 Notify RN    Comment 2 Document in Chart    No results found.  Review of Systems  All other systems reviewed and are negative.   Blood pressure 123/76, pulse 73, temperature 98 F (36.7 C), temperature source Oral, resp. rate 15, height 5' 7.75 (1.721 m), weight 105.7 kg, SpO2 94%. Physical Exam Constitutional:      Appearance: He is well-developed.  HENT:     Head: Atraumatic.   Eyes:     Extraocular Movements: Extraocular movements intact.    Cardiovascular:     Pulses: Normal pulses.  Pulmonary:     Effort: Pulmonary effort is normal.   Musculoskeletal:     Comments: R shoulder pain with limited ROM> NVID   Skin:    General: Skin is warm and dry.   Neurological:     Mental Status: He is alert and oriented to person, place, and time.   Psychiatric:        Mood and Affect: Mood normal.      Assessment/Plan S/p arthroscopic debridement chrondromalacia and partial RCT with incomplete pain relief failed extensive conservative management. Plan R reverse TSA Risks / benefits of surgery discussed Consent on chart  NPO for OR Preop antibiotics   Josefa LELON Herring, MD 07/05/2023, 8:38 AM

## 2023-07-05 NOTE — Transfer of Care (Signed)
 Immediate Anesthesia Transfer of Care Note  Patient: Walter White.  Procedure(s) Performed: ARTHROPLASTY, SHOULDER, TOTAL, REVERSE (Right: Shoulder)  Patient Location: PACU  Anesthesia Type:General and Regional  Level of Consciousness: awake, drowsy, patient cooperative, and responds to stimulation  Airway & Oxygen Therapy: Patient Spontanous Breathing and Patient connected to face mask oxygen  Post-op Assessment: Report given to RN and Post -op Vital signs reviewed and stable  Post vital signs: Reviewed and stable  Last Vitals:  Vitals Value Taken Time  BP 139/86 07/05/23 11:01  Temp 97   Pulse 72 07/05/23 11:03  Resp 16 07/05/23 11:03  SpO2 98 % 07/05/23 11:03  Vitals shown include unfiled device data.  Last Pain:  Vitals:   07/05/23 0910  TempSrc:   PainSc: 0-No pain         Complications: No notable events documented.

## 2023-07-06 ENCOUNTER — Encounter (HOSPITAL_COMMUNITY): Payer: Self-pay | Admitting: Orthopedic Surgery

## 2023-07-10 DIAGNOSIS — M25531 Pain in right wrist: Secondary | ICD-10-CM | POA: Diagnosis not present

## 2023-07-10 DIAGNOSIS — M19011 Primary osteoarthritis, right shoulder: Secondary | ICD-10-CM | POA: Diagnosis not present

## 2023-07-20 DIAGNOSIS — M19011 Primary osteoarthritis, right shoulder: Secondary | ICD-10-CM | POA: Diagnosis not present

## 2023-07-23 DIAGNOSIS — M25511 Pain in right shoulder: Secondary | ICD-10-CM | POA: Diagnosis not present

## 2023-07-26 DIAGNOSIS — M25511 Pain in right shoulder: Secondary | ICD-10-CM | POA: Diagnosis not present

## 2023-07-30 DIAGNOSIS — M25511 Pain in right shoulder: Secondary | ICD-10-CM | POA: Diagnosis not present

## 2023-08-02 DIAGNOSIS — M25511 Pain in right shoulder: Secondary | ICD-10-CM | POA: Diagnosis not present

## 2023-08-06 DIAGNOSIS — M25511 Pain in right shoulder: Secondary | ICD-10-CM | POA: Diagnosis not present

## 2023-08-09 DIAGNOSIS — M25511 Pain in right shoulder: Secondary | ICD-10-CM | POA: Diagnosis not present

## 2023-08-09 DIAGNOSIS — M19049 Primary osteoarthritis, unspecified hand: Secondary | ICD-10-CM | POA: Diagnosis not present

## 2023-08-14 DIAGNOSIS — M25511 Pain in right shoulder: Secondary | ICD-10-CM | POA: Diagnosis not present

## 2023-08-15 DIAGNOSIS — Z1283 Encounter for screening for malignant neoplasm of skin: Secondary | ICD-10-CM | POA: Diagnosis not present

## 2023-08-15 DIAGNOSIS — L820 Inflamed seborrheic keratosis: Secondary | ICD-10-CM | POA: Diagnosis not present

## 2023-08-15 DIAGNOSIS — D225 Melanocytic nevi of trunk: Secondary | ICD-10-CM | POA: Diagnosis not present

## 2023-08-16 DIAGNOSIS — M25511 Pain in right shoulder: Secondary | ICD-10-CM | POA: Diagnosis not present

## 2023-08-20 DIAGNOSIS — G4710 Hypersomnia, unspecified: Secondary | ICD-10-CM | POA: Diagnosis not present

## 2023-08-21 DIAGNOSIS — M25511 Pain in right shoulder: Secondary | ICD-10-CM | POA: Diagnosis not present

## 2023-08-21 DIAGNOSIS — G4710 Hypersomnia, unspecified: Secondary | ICD-10-CM | POA: Diagnosis not present

## 2023-08-21 DIAGNOSIS — G4733 Obstructive sleep apnea (adult) (pediatric): Secondary | ICD-10-CM | POA: Diagnosis not present

## 2023-08-24 DIAGNOSIS — M25511 Pain in right shoulder: Secondary | ICD-10-CM | POA: Diagnosis not present

## 2023-08-28 DIAGNOSIS — M25511 Pain in right shoulder: Secondary | ICD-10-CM | POA: Diagnosis not present

## 2023-08-31 DIAGNOSIS — M25511 Pain in right shoulder: Secondary | ICD-10-CM | POA: Diagnosis not present

## 2023-09-04 DIAGNOSIS — H8113 Benign paroxysmal vertigo, bilateral: Secondary | ICD-10-CM | POA: Diagnosis not present

## 2023-09-04 DIAGNOSIS — M25511 Pain in right shoulder: Secondary | ICD-10-CM | POA: Diagnosis not present

## 2023-09-06 DIAGNOSIS — M25511 Pain in right shoulder: Secondary | ICD-10-CM | POA: Diagnosis not present

## 2023-09-06 DIAGNOSIS — H8113 Benign paroxysmal vertigo, bilateral: Secondary | ICD-10-CM | POA: Diagnosis not present

## 2023-09-11 ENCOUNTER — Other Ambulatory Visit (HOSPITAL_COMMUNITY)

## 2023-09-21 ENCOUNTER — Encounter: Payer: Self-pay | Admitting: Cardiology

## 2023-10-01 ENCOUNTER — Ambulatory Visit (HOSPITAL_COMMUNITY)
Admission: RE | Admit: 2023-10-01 | Discharge: 2023-10-01 | Disposition: A | Discharge status: Home / Self Care | Source: Ambulatory Visit | Attending: Cardiology | Admitting: Cardiology

## 2023-10-01 DIAGNOSIS — I77810 Thoracic aortic ectasia: Secondary | ICD-10-CM | POA: Diagnosis not present

## 2023-10-01 DIAGNOSIS — I712 Thoracic aortic aneurysm, without rupture, unspecified: Secondary | ICD-10-CM | POA: Diagnosis not present

## 2023-10-03 DIAGNOSIS — M25511 Pain in right shoulder: Secondary | ICD-10-CM | POA: Diagnosis not present

## 2023-10-04 DIAGNOSIS — R972 Elevated prostate specific antigen [PSA]: Secondary | ICD-10-CM | POA: Diagnosis not present

## 2023-10-07 ENCOUNTER — Ambulatory Visit: Payer: Self-pay | Admitting: Cardiology

## 2023-10-07 DIAGNOSIS — I77810 Thoracic aortic ectasia: Secondary | ICD-10-CM

## 2023-10-10 ENCOUNTER — Encounter: Payer: Self-pay | Admitting: Cardiology

## 2023-10-10 ENCOUNTER — Ambulatory Visit: Attending: Internal Medicine | Admitting: Cardiology

## 2023-10-10 VITALS — BP 136/80 | HR 80 | Resp 16 | Ht 67.0 in | Wt 241.2 lb

## 2023-10-10 DIAGNOSIS — I2584 Coronary atherosclerosis due to calcified coronary lesion: Secondary | ICD-10-CM | POA: Insufficient documentation

## 2023-10-10 DIAGNOSIS — I714 Abdominal aortic aneurysm, without rupture, unspecified: Secondary | ICD-10-CM | POA: Diagnosis not present

## 2023-10-10 DIAGNOSIS — I700 Atherosclerosis of aorta: Secondary | ICD-10-CM | POA: Diagnosis not present

## 2023-10-10 DIAGNOSIS — I10 Essential (primary) hypertension: Secondary | ICD-10-CM | POA: Insufficient documentation

## 2023-10-10 DIAGNOSIS — E785 Hyperlipidemia, unspecified: Secondary | ICD-10-CM | POA: Insufficient documentation

## 2023-10-10 DIAGNOSIS — I2510 Atherosclerotic heart disease of native coronary artery without angina pectoris: Secondary | ICD-10-CM | POA: Diagnosis not present

## 2023-10-10 DIAGNOSIS — R931 Abnormal findings on diagnostic imaging of heart and coronary circulation: Secondary | ICD-10-CM | POA: Insufficient documentation

## 2023-10-10 NOTE — Patient Instructions (Signed)
 Medication Instructions:  NO CHANGES   Follow-Up: At [facility], you and your health needs are our priority.  As part of our continuing mission to provide you with exceptional heart care, our providers are all part of one team.  This team includes your primary Cardiologist (physician) and Advanced Practice Providers or APPs (Physician Assistants and Nurse Practitioners) who all work together to provide you with the care you need, when you need it.  Your next appointment:   12 month(s)   We recommend signing up for the patient portal called MyChart.  Sign up information is provided on this After Visit Summary.  MyChart is used to connect with patients for Virtual Visits (Telemedicine).  Patients are able to view lab/test results, encounter notes, upcoming appointments, etc.  Non-urgent messages can be sent to your provider as well.   To learn more about what you can do with MyChart, go to [email or web address].   Other Instructions

## 2023-10-10 NOTE — Progress Notes (Signed)
 Cardiology Office Note:  .   Date:  10/10/2023  ID:  Walter White., DOB November 06, 1950, MRN [id number] PCP:  Clarice Nottingham, MD  Former Cardiology Providers: NA [facility] Providers Cardiologist:  Madonna Large, DO , [facility] (established care 10/20/2021) Electrophysiologist:  None  Click to update primary MD,subspecialty MD or APP then REFRESH:1}    Chief Complaint  Patient presents with   Coronary atherosclerosis due to calcified coronary lesion   Follow-up    6 months    History of Present Illness: .   Walter White. is a 73 y.o. 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   Referred to practice for evaluation of severe coronary artery calcification.  He has undergone appropriate cardiovascular workup as outlined below.  He been focusing on improving his modifiable cardiovascular risk factors and he presents today for follow-up.  In June 2025 patient did undergo right shoulder surgery and has recovered well. Patient had a CT chest without contrast in September 2025 to follow his ascending thoracic dilatation.  Based on the most recent CT his ascending thoracic aorta measures 43 mm.   During his prior CTs patient was noted to have incidental findings of pulmonary nodules which were reported to be stable.  But given his history of smoking I had advised him to follow-up with PCP for further recommendations/workup.    Patient states that since last office visit he was diagnosed with sleep apnea and is currently working with his provider to optimize his device therapy.  Home SBP ranges between 125-130 mmHg.  Outside labs independently reviewed from May 2025 via [facility] database.  LDL and  triglyceride levels are still not well-controlled despite his cardiovascular risk factors.  He remains hesitant with uptitration of medical therapy and will discuss with PCP.  Review of Systems: .   Review of Systems  Cardiovascular:  Negative for chest pain, claudication, irregular heartbeat, leg swelling, near-syncope, orthopnea, palpitations, paroxysmal nocturnal dyspnea and syncope.  Respiratory:  Negative for shortness of breath.   Hematologic/Lymphatic: Negative for bleeding problem.    Studies Reviewed:   EKG: EKG Interpretation Date/Time:  Wednesday October 10 2023 08:37:02 EDT Ventricular Rate:  71 PR Interval:  182 QRS Duration:  90 QT Interval:  372 QTC Calculation: 404 R Axis:   -52  Text Interpretation: Normal sinus rhythm Left anterior fascicular block When compared with ECG of 14-Mar-2023 08:50, Borderline criteria for Anterolateral infarct are no longer Present Confirmed by Large Madonna [phone]) on 10/10/2023 8:48:12 AM  Echocardiogram: 11/07/2021 1. Left ventricular ejection fraction, by estimation, is 55 to 60%. The left ventricle has normal function. The left ventricle has no regional wall motion abnormalities. There is moderate left ventricular hypertrophy. Left ventricular diastolic  parameters were normal. 2. Right ventricular systolic function is normal. The right ventricular size is normal. 3. The mitral valve is degenerative. No evidence of mitral valve regurgitation. No evidence of mitral stenosis. 4. The aortic valve is tricuspid. Aortic valve regurgitation is not visualized. Aortic valve sclerosis is present, with no evidence of aortic valve stenosis. 5. Aortic dilatation noted. There is mild dilatation of the aortic root, measuring 39 mm. There is mild dilatation of the ascending aorta, measuring 41 mm.   Stress Testing: Lexiscan  Nuclear stress test 11/14/2021: Low risk  Coronary artery calcium  score: 07/20/2021 1. Total Agatston score of 1455. This  places the patient in the 88th percentile for age, gender and race/ethnicity. 2. Aortic atherosclerosis. 3. Mild aneurysmal dilation of the ascending thoracic aorta 42mm.  Recommend annual imaging followup by CTA or MRA   US  AAA Duplex limited: 12/01/2021: Abdominal Aorta: There is evidence of abnormal dilatation of the proximal Abdominal aorta. The largest aortic measurement is 4.0 cm. The largest aortic diameter remains essentially unchanged compared to prior exam.  Previous diameter measurement was 4.0 cm obtained on 03/31/2019.   RADIOLOGY: CT chest w/o contrast 09/24/2022 1. Stable mild dilatation of the ascending thoracic aorta measuring approximately 4.2 cm. There also is stable mild dilatation of the aortic arch and descending thoracic aorta.  2. Extensive calcified coronary artery plaque. 3. Stable 3 mm subpleural nodule in the superior segment of the left lower lobe. Stable 2 mm anterior subpleural nodule in the right upper lobe. Similar 2 mm posterior subpleural nodule in the superior left upper lobe was not imaged on the prior study. No follow-up needed if patient is low-risk (and has no known or suspected primary neoplasm). Non-contrast chest CT can be considered in 12 months if patient is high-risk. This recommendation follows the consensus statement: Guidelines for Management of Incidental Pulmonary Nodules Detected on CT Images: From the Fleischner Society 2017; Radiology 2017; 284:228-243. 4. Cholelithiasis. 5. Aortic atherosclerosis.   Aortic aneurysm NOS (ICD10-I71.9).  CT chest without contrast 10/04/2023 1. 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 Recommend annual imaging followup by CTA or mra. This recommendation follows 2010 accf/aha/aats/ACR/asa/sca/scai/sir/sts/svm guidelines for the diagnosis and management of patients  with thoracic aortic disease. Circulation. 2010; 121: Z733-z630. Aortic aneurysm nos (icd10-i71.9) 2. Enlarged central pulmonary arteries in keeping with changes of pulmonary arterial hypertension. 3. Extensive multivessel coronary artery calcification.  Risk Assessment/Calculations:   NA   Labs:       Latest Ref Rng & Units 06/15/2023    9:40 AM 05/31/2023    8:45 AM 11/22/2022    8:42 AM  CBC  WBC 4.0 - 10.5 K/uL 3.2  2.8  3.3   Hemoglobin 13.0 - 17.0 g/dL 89.1  89.0  88.9   Hematocrit 39.0 - 52.0 % 33.8  31.7  32.7   Platelets 150 - 400 K/uL 134  124  136        Latest Ref Rng & Units 06/15/2023    9:40 AM 05/31/2023    8:45 AM 11/22/2022    8:42 AM  BMP  Glucose 70 - 99 mg/dL 867  808  879   BUN 8 - 23 mg/dL 34  30  38   Creatinine 0.61 - 1.24 mg/dL 8.71  8.82  8.71   Sodium 135 - 145 mmol/L 135  135  135   Potassium 3.5 - 5.1 mmol/L 4.5  4.1  4.4   Chloride 98 - 111 mmol/L 105  104  104   CO2 22 - 32 mmol/L 25  25  28    Calcium  8.9 - 10.3 mg/dL 9.3  9.7  9.7       Latest Ref Rng & Units 06/15/2023    9:40 AM 05/31/2023    8:45 AM 11/22/2022    8:42 AM  CMP  Glucose 70 - 99 mg/dL 867  808  879   BUN 8 - 23 mg/dL 34  30  38   Creatinine 0.61 - 1.24 mg/dL 8.71  8.82  8.71   Sodium 135 - 145 mmol/L 135  135  135   Potassium 3.5 - 5.1 mmol/L 4.5  4.1  4.4   Chloride 98 - 111 mmol/L 105  104  104   CO2 22 - 32 mmol/L 25  25  28    Calcium  8.9 - 10.3 mg/dL 9.3  9.7  9.7   Total Protein 6.5 - 8.1 g/dL  8.8  8.4   Total Bilirubin 0.0 - 1.2 mg/dL  0.8  0.6   Alkaline Phos 38 - 126 U/L  72  75   AST 15 - 41 U/L  31  36   ALT 0 - 44 U/L  21  41     No results found for: CHOL, HDL, LDLCALC, LDLDIRECT, TRIG, CHOLHDL No results for input(s): LIPOA in the last 8760 hours. No components found for: NTPROBNP No results for input(s): PROBNP in the last 8760 hours. No results for input(s): TSH in the last 8760 hours.  External Labs: Collected: 12/19/2022 [facility]  database. Total cholesterol 153, HDL 36, LDL 84, triglycerides 191 J8r 6.7. Serum creatinine 1.22.  External Labs: Collected: 05/14/2023 [facility] database. Total cholesterol 163, triglycerides 210, HDL 38, LDL 89 A1c 6.6%. Collected: June 15, 2023 [facility] database. Creatinine 1.28.   Physical Exam:    Today's Vitals   10/10/23 0839  BP: 136/80  Pulse: 80  Resp: 16  SpO2: 96%  Weight: 241 lb 3.2 oz (109.4 kg)  Height: 5' 7 (1.702 m)   Body mass index is 37.78 kg/m. Wt Readings from Last 3 Encounters:  10/10/23 241 lb 3.2 oz (109.4 kg)  07/05/23 233 lb (105.7 kg)  06/15/23 233 lb (105.7 kg)    Physical Exam  Constitutional: No distress.  Age appropriate, hemodynamically stable.   Neck: No JVD present.  Cardiovascular: Normal rate, regular rhythm, S1 normal, S2 normal and intact distal pulses. Exam reveals no gallop, no S3 and no S4.  No murmur heard. Pulses:      Radial pulses are 2+ on the right side and 2+ on the left side.       Dorsalis pedis pulses are 2+ on the right side and 2+ on the left side.       Posterior tibial pulses are 2+ on the right side and 2+ on the left side.  Pulmonary/Chest: Effort normal and breath sounds normal. No stridor. He has no wheezes. He has no rales.  Abdominal: Soft. Bowel sounds are normal. He exhibits no distension. There is no abdominal tenderness.  Musculoskeletal:        General: No edema.     Cervical back: Neck supple.  Neurological: He is alert and oriented to person, place, and time. He has intact cranial nerves (2-12).  Skin: Skin is warm and moist.   Impression & Recommendation(s):  Impression:   ICD-10-CM   1. Coronary atherosclerosis due to calcified coronary lesion  I25.10 EKG 12-Lead   I25.84     2. Agatston CAC score, >400  R93.1     3. Atherosclerosis of aorta  I70.0     4. Benign hypertension  I10     5. Abdominal aortic aneurysm (AAA) without rupture, unspecified part  I71.40     6. Hyperlipidemia, unspecified  hyperlipidemia type  E78.5        Recommendation(s):  Coronary atherosclerosis due to calcified coronary lesion Agatston CAC score, >400 Atherosclerosis of aorta (HCC) Total CAC 1455,  88 percentile. Denies anginal chest pain or heart failure symptoms. EKG nonischemic LDL and triglyceride levels are not well-controlled. We emphasized the importance of optimizing his lipids.  Recommend a goal LDL of at least less than 70 mg/dL and if possible close to 55 mg/dL given his comorbidities.  Recommended triglyceride levels <149 mg/dL.  Patient does not want to uptitrate medical therapy at this time and will follow-up with PCP for further guidance I have voiced my concerns that elevated lipids can contribute to worsening of atherosclerosis burden causing worsening of comorbid conditions.  Patient verbalized understanding  Ascending aorta dilation [facility]) September 2024-ascending thoracic aorta measures 42 mm September 2025-ascending aorta 43 mm Office and home blood pressures are well-controlled. Will hold off on uptitration of antihypertensive medications given his history of dizziness. You should avoid use of Ciprofloxacin and other associated antibiotics (flouroquinolone antibiotics ) It is  best to avoid activities that cause grunting or straining (medically referred to as a "valsalva maneuver"). This happens when a person bears down against a closed throat to increase the strength of arm or abdominal muscles. There's often a tendency to do this when lifting heavy weights, doing sit-ups, push-ups or chin-ups, etc., but it may be harmful.  Will repeat CT chest in September 2026, already ordered.   Benign hypertension Home blood pressures are better controlled on current medical therapy Recommended goal goal SBP <130 mmHg given the dilated ascending aorta.  Abdominal aortic aneurysm (AAA) without rupture, unspecified part (HCC) Follows with vascular surgery.  Hyperlipidemia, unspecified  hyperlipidemia type Currently on Crestor  5 mg p.o. daily. Given his severe CAC, aortic atherosclerosis and a goal LDL at least <70 mg/dL and if possible closer to 55 mg/dL. In the past patient was willing to consider PCSK9 inhibitors; however, has not started them. Recommended referral to Pharm.D. for further evaluation and management and to evaluate for coverage.  Patient states that he will discuss it further with PCP.  Of note his CT chest without contrast in September 2024 noted subpleural nodules less than 4 mm.  Based on radiology recommendations no follow-up was needed if patient is considered to be low risk.  However given his history of smoking recommended that he discuss this further with PCP to see if further imaging is warranted for monitoring.  Patient states that he will follow-up with PCP in the coming months.  Orders Placed:  Orders Placed This Encounter  Procedures   EKG 12-Lead     Final Medication List:   No orders of the defined types were placed in this encounter.   There are no discontinued medications.   Current Outpatient Medications:    acetaminophen  (TYLENOL ) 650 MG CR tablet, Take 650 mg by mouth every 8 (eight) hours as needed for pain., Disp: , Rfl:    aspirin  EC (ASPIRIN  ADULT LOW DOSE) 81 MG tablet, Take 1 tablet (81 mg total) by mouth 2 (two) times daily. Swallow whole., Disp: , Rfl:    Cholecalciferol  (VITAMIN D ) 125 MCG (5000 UT) CAPS, Take 5,000 Units by mouth daily with supper., Disp: , Rfl:    ezetimibe (ZETIA) 10 MG tablet, Take 10 mg by mouth daily with supper., Disp: , Rfl:    famotidine (PEPCID) 20 MG tablet, Take 20 mg by mouth at bedtime., Disp: , Rfl:    fluticasone  (FLONASE ) 50 MCG/ACT nasal spray, Place 1-2 sprays into both nostrils daily as needed for allergies or rhinitis., Disp: , Rfl:    gabapentin  (NEURONTIN ) 300 MG capsule, Take 600 mg  by mouth at bedtime., Disp: , Rfl:    lisinopril -hydrochlorothiazide  (ZESTORETIC ) 20-25 MG tablet, Take  1 tablet by mouth in the morning., Disp: , Rfl:    metFORMIN (GLUCOPHAGE-XR) 500 MG 24 hr tablet, Take 500 mg by mouth daily with supper., Disp: , Rfl:    oxyCODONE  (ROXICODONE ) 5 MG immediate release tablet, Take 1 tablet (5 mg total) by mouth every 4 (four) hours as needed., Disp: 30 tablet, Rfl: 0   pantoprazole  (PROTONIX ) 40 MG tablet, Take 40 mg by mouth in the morning., Disp: , Rfl:    rosuvastatin  (CRESTOR ) 5 MG tablet, Take 5 mg by mouth daily with supper., Disp: , Rfl:    tiZANidine  (ZANAFLEX ) 4 MG tablet, Take 1 tablet (4 mg total) by mouth every 8 (eight) hours as needed for muscle spasms., Disp: 30 tablet, Rfl: 1  Consent:   NA  Disposition:   October 2026  His questions and concerns were addressed to his satisfaction. He voices understanding of the recommendations provided during this encounter.    Signed, Madonna Michele HAS, [facility]  A Division of [facility] [address], [place], [place] [address]  10/10/2023 11:04 AM

## 2023-10-11 DIAGNOSIS — M4316 Spondylolisthesis, lumbar region: Secondary | ICD-10-CM | POA: Diagnosis not present

## 2023-10-25 DIAGNOSIS — M4316 Spondylolisthesis, lumbar region: Secondary | ICD-10-CM | POA: Diagnosis not present

## 2023-10-29 DIAGNOSIS — G4733 Obstructive sleep apnea (adult) (pediatric): Secondary | ICD-10-CM | POA: Diagnosis not present

## 2023-10-30 DIAGNOSIS — M4316 Spondylolisthesis, lumbar region: Secondary | ICD-10-CM | POA: Diagnosis not present

## 2023-10-30 DIAGNOSIS — G4733 Obstructive sleep apnea (adult) (pediatric): Secondary | ICD-10-CM | POA: Diagnosis not present

## 2023-11-01 DIAGNOSIS — M4316 Spondylolisthesis, lumbar region: Secondary | ICD-10-CM | POA: Diagnosis not present

## 2023-11-06 DIAGNOSIS — M4316 Spondylolisthesis, lumbar region: Secondary | ICD-10-CM | POA: Diagnosis not present

## 2023-11-08 DIAGNOSIS — M4316 Spondylolisthesis, lumbar region: Secondary | ICD-10-CM | POA: Diagnosis not present

## 2023-11-13 DIAGNOSIS — M4316 Spondylolisthesis, lumbar region: Secondary | ICD-10-CM | POA: Diagnosis not present

## 2023-11-15 DIAGNOSIS — M4316 Spondylolisthesis, lumbar region: Secondary | ICD-10-CM | POA: Diagnosis not present

## 2023-11-20 DIAGNOSIS — M4316 Spondylolisthesis, lumbar region: Secondary | ICD-10-CM | POA: Diagnosis not present

## 2023-11-22 ENCOUNTER — Inpatient Hospital Stay: Payer: Medicare Other | Attending: Hematology and Oncology

## 2023-11-22 ENCOUNTER — Other Ambulatory Visit: Payer: Self-pay | Admitting: Hematology and Oncology

## 2023-11-22 DIAGNOSIS — D649 Anemia, unspecified: Secondary | ICD-10-CM | POA: Insufficient documentation

## 2023-11-22 DIAGNOSIS — E785 Hyperlipidemia, unspecified: Secondary | ICD-10-CM | POA: Diagnosis not present

## 2023-11-22 DIAGNOSIS — Z800 Family history of malignant neoplasm of digestive organs: Secondary | ICD-10-CM | POA: Diagnosis not present

## 2023-11-22 DIAGNOSIS — Z9089 Acquired absence of other organs: Secondary | ICD-10-CM | POA: Diagnosis not present

## 2023-11-22 DIAGNOSIS — Z7982 Long term (current) use of aspirin: Secondary | ICD-10-CM | POA: Insufficient documentation

## 2023-11-22 DIAGNOSIS — Z8679 Personal history of other diseases of the circulatory system: Secondary | ICD-10-CM | POA: Insufficient documentation

## 2023-11-22 DIAGNOSIS — M549 Dorsalgia, unspecified: Secondary | ICD-10-CM | POA: Insufficient documentation

## 2023-11-22 DIAGNOSIS — M4316 Spondylolisthesis, lumbar region: Secondary | ICD-10-CM | POA: Diagnosis not present

## 2023-11-22 DIAGNOSIS — Z9841 Cataract extraction status, right eye: Secondary | ICD-10-CM | POA: Insufficient documentation

## 2023-11-22 DIAGNOSIS — Z8701 Personal history of pneumonia (recurrent): Secondary | ICD-10-CM | POA: Insufficient documentation

## 2023-11-22 DIAGNOSIS — Z880 Allergy status to penicillin: Secondary | ICD-10-CM | POA: Insufficient documentation

## 2023-11-22 DIAGNOSIS — G4733 Obstructive sleep apnea (adult) (pediatric): Secondary | ICD-10-CM | POA: Insufficient documentation

## 2023-11-22 DIAGNOSIS — Z79899 Other long term (current) drug therapy: Secondary | ICD-10-CM | POA: Diagnosis not present

## 2023-11-22 DIAGNOSIS — Z9842 Cataract extraction status, left eye: Secondary | ICD-10-CM | POA: Diagnosis not present

## 2023-11-22 DIAGNOSIS — D472 Monoclonal gammopathy: Secondary | ICD-10-CM

## 2023-11-22 DIAGNOSIS — C9000 Multiple myeloma not having achieved remission: Secondary | ICD-10-CM | POA: Insufficient documentation

## 2023-11-22 DIAGNOSIS — Z87891 Personal history of nicotine dependence: Secondary | ICD-10-CM | POA: Diagnosis not present

## 2023-11-22 LAB — CMP (CANCER CENTER ONLY)
ALT: 22 U/L (ref 0–44)
AST: 35 U/L (ref 15–41)
Albumin: 4 g/dL (ref 3.5–5.0)
Alkaline Phosphatase: 71 U/L (ref 38–126)
Anion gap: 5 (ref 5–15)
BUN: 34 mg/dL — ABNORMAL HIGH (ref 8–23)
CO2: 25 mmol/L (ref 22–32)
Calcium: 9.7 mg/dL (ref 8.9–10.3)
Chloride: 106 mmol/L (ref 98–111)
Creatinine: 1.15 mg/dL (ref 0.61–1.24)
GFR, Estimated: >60 mL/min (ref >60)
Glucose, Bld: 143 mg/dL — ABNORMAL HIGH (ref 70–99)
Potassium: 4.5 mmol/L (ref 3.5–5.1)
Sodium: 136 mmol/L (ref 135–145)
Total Bilirubin: 0.6 mg/dL (ref 0.0–1.2)
Total Protein: 8.8 g/dL — ABNORMAL HIGH (ref 6.5–8.1)

## 2023-11-22 LAB — CBC WITH DIFFERENTIAL (CANCER CENTER ONLY)
Abs Immature Granulocytes: 0 K/uL (ref 0.00–0.07)
Basophils Absolute: 0 K/uL (ref 0.0–0.1)
Basophils Relative: 1 %
Eosinophils Absolute: 0.1 K/uL (ref 0.0–0.5)
Eosinophils Relative: 5 %
HCT: 29.8 % — ABNORMAL LOW (ref 39.0–52.0)
Hemoglobin: 10 g/dL — ABNORMAL LOW (ref 13.0–17.0)
Immature Granulocytes: 0 %
Lymphocytes Relative: 25 %
Lymphs Abs: 0.6 K/uL — ABNORMAL LOW (ref 0.7–4.0)
MCH: 30.8 pg (ref 26.0–34.0)
MCHC: 33.6 g/dL (ref 30.0–36.0)
MCV: 91.7 fL (ref 80.0–100.0)
Monocytes Absolute: 0.3 K/uL (ref 0.1–1.0)
Monocytes Relative: 12 %
Neutro Abs: 1.5 K/uL — ABNORMAL LOW (ref 1.7–7.7)
Neutrophils Relative %: 57 %
Platelet Count: 136 K/uL — ABNORMAL LOW (ref 150–400)
RBC: 3.25 MIL/uL — ABNORMAL LOW (ref 4.22–5.81)
RDW: 14.8 % (ref 11.5–15.5)
WBC Count: 2.6 K/uL — ABNORMAL LOW (ref 4.0–10.5)
nRBC: 0 % (ref 0.0–0.2)

## 2023-11-22 LAB — LACTATE DEHYDROGENASE: LDH: 179 U/L (ref 105–235)

## 2023-11-23 LAB — KAPPA/LAMBDA LIGHT CHAINS
Kappa free light chain: 112.5 mg/L — ABNORMAL HIGH (ref 3.3–19.4)
Kappa, lambda light chain ratio: 10.71 — ABNORMAL HIGH (ref 0.26–1.65)
Lambda free light chains: 10.5 mg/L (ref 5.7–26.3)

## 2023-11-26 LAB — MULTIPLE MYELOMA PANEL, SERUM
Albumin SerPl Elph-Mcnc: 3.8 g/dL (ref 2.9–4.4)
Albumin/Glob SerPl: 0.8 (ref 0.7–1.7)
Alpha 1: 0.3 g/dL (ref 0.0–0.4)
Alpha2 Glob SerPl Elph-Mcnc: 0.8 g/dL (ref 0.4–1.0)
B-Globulin SerPl Elph-Mcnc: 1 g/dL (ref 0.7–1.3)
Gamma Glob SerPl Elph-Mcnc: 2.9 g/dL — ABNORMAL HIGH (ref 0.4–1.8)
Globulin, Total: 5 g/dL — ABNORMAL HIGH (ref 2.2–3.9)
IgA: 10 mg/dL — ABNORMAL LOW (ref 61–437)
IgG (Immunoglobin G), Serum: 3791 mg/dL — ABNORMAL HIGH (ref 603–1613)
IgM (Immunoglobulin M), Srm: 19 mg/dL (ref 15–143)
M Protein SerPl Elph-Mcnc: 2.7 g/dL — ABNORMAL HIGH
Total Protein ELP: 8.8 g/dL — ABNORMAL HIGH (ref 6.0–8.5)

## 2023-11-27 DIAGNOSIS — M4316 Spondylolisthesis, lumbar region: Secondary | ICD-10-CM | POA: Diagnosis not present

## 2023-11-29 ENCOUNTER — Inpatient Hospital Stay: Payer: Medicare Other | Admitting: Hematology and Oncology

## 2023-11-29 ENCOUNTER — Inpatient Hospital Stay

## 2023-11-29 VITALS — BP 130/68 | HR 86 | Temp 98.0°F | Resp 14 | Wt 241.6 lb

## 2023-11-29 DIAGNOSIS — C9000 Multiple myeloma not having achieved remission: Secondary | ICD-10-CM | POA: Diagnosis not present

## 2023-11-29 DIAGNOSIS — D649 Anemia, unspecified: Secondary | ICD-10-CM

## 2023-11-29 DIAGNOSIS — Z7982 Long term (current) use of aspirin: Secondary | ICD-10-CM | POA: Diagnosis not present

## 2023-11-29 DIAGNOSIS — D472 Monoclonal gammopathy: Secondary | ICD-10-CM

## 2023-11-29 DIAGNOSIS — E785 Hyperlipidemia, unspecified: Secondary | ICD-10-CM | POA: Diagnosis not present

## 2023-11-29 DIAGNOSIS — G4733 Obstructive sleep apnea (adult) (pediatric): Secondary | ICD-10-CM | POA: Diagnosis not present

## 2023-11-29 DIAGNOSIS — M549 Dorsalgia, unspecified: Secondary | ICD-10-CM | POA: Diagnosis not present

## 2023-11-29 LAB — CBC WITH DIFFERENTIAL (CANCER CENTER ONLY)
Abs Immature Granulocytes: 0 K/uL (ref 0.00–0.07)
Basophils Absolute: 0 K/uL (ref 0.0–0.1)
Basophils Relative: 1 %
Eosinophils Absolute: 0.1 K/uL (ref 0.0–0.5)
Eosinophils Relative: 4 %
HCT: 31.3 % — ABNORMAL LOW (ref 39.0–52.0)
Hemoglobin: 10.4 g/dL — ABNORMAL LOW (ref 13.0–17.0)
Immature Granulocytes: 0 %
Lymphocytes Relative: 22 %
Lymphs Abs: 0.6 K/uL — ABNORMAL LOW (ref 0.7–4.0)
MCH: 31 pg (ref 26.0–34.0)
MCHC: 33.2 g/dL (ref 30.0–36.0)
MCV: 93.2 fL (ref 80.0–100.0)
Monocytes Absolute: 0.2 K/uL (ref 0.1–1.0)
Monocytes Relative: 7 %
Neutro Abs: 1.7 K/uL (ref 1.7–7.7)
Neutrophils Relative %: 66 %
Platelet Count: 122 K/uL — ABNORMAL LOW (ref 150–400)
RBC: 3.36 MIL/uL — ABNORMAL LOW (ref 4.22–5.81)
RDW: 14.8 % (ref 11.5–15.5)
WBC Count: 2.6 K/uL — ABNORMAL LOW (ref 4.0–10.5)
nRBC: 0 % (ref 0.0–0.2)

## 2023-11-29 LAB — RETIC PANEL
Immature Retic Fract: 17.8 % — ABNORMAL HIGH (ref 2.3–15.9)
RBC.: 3.45 MIL/uL — ABNORMAL LOW (ref 4.22–5.81)
Retic Count, Absolute: 61.4 K/uL (ref 19.0–186.0)
Retic Ct Pct: 1.8 % (ref 0.4–3.1)
Reticulocyte Hemoglobin: 33.4 pg (ref >27.9)

## 2023-11-29 LAB — FERRITIN: Ferritin: 52 ng/mL (ref 24–336)

## 2023-11-29 LAB — IRON AND IRON BINDING CAPACITY (CC-WL,HP ONLY)
Iron: 101 ug/dL (ref 45–182)
Saturation Ratios: 21 % (ref 17.9–39.5)
TIBC: 473 ug/dL — ABNORMAL HIGH (ref 250–450)
UIBC: 372 ug/dL

## 2023-11-29 LAB — FOLATE: Folate: 10.4 ng/mL (ref >5.9)

## 2023-11-29 LAB — VITAMIN B12: Vitamin B-12: 396 pg/mL (ref 180–914)

## 2023-11-29 NOTE — Progress Notes (Signed)
 [facility] [phone]   [phone]  PROGRESS NOTE  Patient Care Team: Federico Norleen ONEIDA MADISON, MD as PCP - General (Hematology and Oncology) Michele Richardson, DO as PCP - Cardiology (Cardiology) Liam Lerner, MD as Consulting Physician (Orthopedic Surgery) Beuford Anes, MD as Consulting Physician (Orthopedic Surgery) Gillie Duncans, MD as Consulting Physician (Neurosurgery) Nicholaus Tanda LITTIE DOUGLAS, MD as Attending Physician (Urology)  Hematological/Oncological History # IgG Kappa Monoclonal Gammopathy of Undetermined Significance 04/02/2018: underwent bone marrow biopsy, showed 9% plasma cells. 10/21/2020: last visit with Dr. Layla 05/25/2021: Labs showed M protein 2.7, kappa 70, lambda 11.3, and ratio 6.19.  White blood cell count 3.7, hemoglobin 11.4, MCV 90.5, and platelets 137 06/01/2021: establish care with Dr. Federico   Interval History:  Elsie LITTIE Nicholaus Mickey. 73 y.o. male with medical history significant for IgG kappa monoclonal gammopathy of undetermined significance who presents for a follow up visit. The patient's last visit was on 11/30/2023. In the interim since the last visit he has had no major changes in his health.  On exam today Mr. Lindfors reports he has been well overall in interim since her last visit and was recently diagnosed with severe OSA.  He reports that he is using a CPAP machine but has been having some difficulty with it.  He sleeps in a recliner and if he moves the wrong way he gets a blast of air.  He is currently planning a visit upcoming with his sleep specialist.  They are considering a mouthpiece versus an implant though he is hoping to avoid any surgical procedures.  He does have chronic back pain which is currently stable.  He notes that he did have a shoulder placement done in June 2025 and he is still sore from that.  He reports his energy levels are low but that he is eating okay with good appetite.  He notes he is not having  any issues with bleeding, bruising, or dark stools.  He is aware of his elevated PSA and notes he follows with urology.  He denies any lightheadedness, dizziness, shortness of breath.  A full 10 point ROS otherwise negative.  MEDICAL HISTORY:  Past Medical History:  Diagnosis Date   AAA (abdominal aortic aneurysm)    4.0 cm AAA 08/22/17 U/S   Allergy    Allergic Rhinitis   Arthritis    spinal steniosis   Benign prostate hyperplasia    with lower urinary tract symptoms   Carpal tunnel syndrome on right    Cataracts, both eyes    Chronic back pain    caused by spinal stenosis   Elevated PSA    GERD (gastroesophageal reflux disease)    Hyperlipidemia    Hypertension    Essential.     MGUS (monoclonal gammopathy of unknown significance)    Microhematuria    OA (osteoarthritis) of knee    Pneumonia    Hx -when very young   Pre-diabetes    Seasonal allergies    Sleep apnea    Spinal stenosis    of lumbosacral region.  Causes chronic back pain.   Upper abdominal pain     SURGICAL HISTORY: Past Surgical History:  Procedure Laterality Date   ABDOMINAL EXPOSURE N/A 09/05/2017   Procedure: ABDOMINAL EXPOSURE;  Surgeon: Oris Krystal FALCON, MD;  Location: [facility] OR;  Service: Vascular;  Laterality: N/A;   ANTERIOR LAT LUMBAR FUSION Left 01/26/2016   Procedure: LEFT SIDED LATERAL INTERBODY FUSION, LUMBAR 3-4 WITH INSTRUMENTATION AND ALLOGRAFT;  Surgeon: Oneil Priestly, MD;  Location: [facility] OR;  Service: Orthopedics;  Laterality: Left;  LEFT SIDED LATERAL INTERBODY FUSION, LUMBAR 3-4 WITH INSTRUMENTATION AND ALLOGRAFT   ANTERIOR LAT LUMBAR FUSION Right 08/14/2018   Procedure: RIGHT LUMBAR two to LUMBAR three LATERAL INTERBODY FUSION WITH INSTRUMENTATION AND ALLOGRAFT;  Surgeon: Priestly Oneil, MD;  Location: [facility] OR;  Service: Orthopedics;  Laterality: Right;   ANTERIOR LATERAL LUMBAR FUSION WITH PERCUTANEOUS SCREW 1 LEVEL N/A 09/05/2017   Procedure: LUMBAR 5 - SACRUM 1 ANTERIOR LUMBAR INTERBODY FUSION WITH  INSTRUMENTATION AND ALLOGRAFT;  Surgeon: Priestly Oneil, MD;  Location: [facility] OR;  Service: Orthopedics;  Laterality: N/A;   BACK SURGERY  01/2015   dr. priestly. [id number].   CARPAL TUNNEL RELEASE     right    CATARACT EXTRACTION, BILATERAL     COLONOSCOPY     COLONOSCOPY  2010,2016   polyp   ELBOW SURGERY     right ulnar release   ELBOW SURGERY     LEFT CTR   EYE SURGERY     FINGER ARTHROPLASTY Left 04/04/2021   Procedure: LEFT INDEX FINGER IMPLANT ARTHROPLASTY AT METACARPOPHALANGEAL JOINT;  Surgeon: Sebastian Lenis, MD;  Location: [facility];  Service: Orthopedics;  Laterality: Left;  PRE-OP BLOCK,   FINGER ARTHROPLASTY Left 09/16/2021   Procedure: LEFT INDEX FINGER METACARPOPHALANGEAL JOINT REVISION ARTHROPLASTY;  Surgeon: Sebastian Lenis, MD;  Location: [facility];  Service: Orthopedics;  Laterality: Left;   JOINT REPLACEMENT     left- Sept 2016   KNEE ARTHROSCOPY  11/23/2010   Procedure: ARTHROSCOPY KNEE;  Surgeon: Dempsey JINNY Sensor;  Location: [facility];  Service: Orthopedics;  Laterality: Left;  arthroscopy left knee   KNEE ARTHROSCOPY     left x 2   Knee scar tissue removal Left 2018   LATERAL FUSION LUMBAR SPINE  01./17/2018   polypectomy  2010   REVERSE SHOULDER ARTHROPLASTY Right 07/05/2023   Procedure: ARTHROPLASTY, SHOULDER, TOTAL, REVERSE;  Surgeon: Dozier Soulier, MD;  Location: [facility];  Service: Orthopedics;  Laterality: Right;   SHOULDER ARTHROSCOPY Left    x2   TONSILLECTOMY     TOTAL KNEE ARTHROPLASTY Left 09/21/2014   Procedure: TOTAL KNEE ARTHROPLASTY;  Surgeon: Dempsey Sensor, MD;  Location: [facility] OR;  Service: Orthopedics;  Laterality: Left;   ULNAR NERVE TRANSPOSITION Left 04/16/2017   Procedure: LEFT ULNAR NEUROPLASTY AT THE WRIST AND TRANSPOSITION AT THE ELBOW;  Surgeon: Sebastian Lenis, MD;  Location: [facility];  Service: Orthopedics;  Laterality: Left;   WISDOM TOOTH EXTRACTION      SOCIAL HISTORY: Social  History   Socioeconomic History   Marital status: Married    Spouse name: Montie    Number of children: 2   Years of education: Some colle   Highest education level: Not on file  Occupational History   Occupation: Retired  Tobacco Use   Smoking status: Former    Current packs/day: 0.00    Average packs/day: 0.5 packs/day for 10.0 years (5.0 ttl pk-yrs)    Types: Cigarettes    Start date: 11/22/1970    Quit date: 11/21/1980    Years since quitting: 43.0   Smokeless tobacco: Never  Vaping Use   Vaping status: Never Used  Substance and Sexual Activity   Alcohol use: Not Currently   Drug use: No   Sexual activity: Not Currently  Other Topics Concern   Not on file  Social History Narrative   Lives with wife   Caffeine  use: tea every day   Social Drivers of Corporate Investment Banker Strain: Not on file  Food Insecurity: Low Risk  (10/04/2023)   Received from [facility]   Hunger Vital Sign    Within the past 12 months, you worried that your food would run out before you got money to buy more: Never true    Within the past 12 months, the food you bought just didn't last and you didn't have money to get more. : Never true  Transportation Needs: No Transportation Needs (10/04/2023)   Received from [facility]    In the past 12 months, has lack of reliable transportation kept you from medical appointments, meetings, work or from getting things needed for daily living? : No  Physical Activity: Not on file  Stress: Not on file  Social Connections: Not on file  Intimate Partner Violence: Not on file    FAMILY HISTORY: Family History  Problem Relation Age of Onset   Cancer Mother    Liver disease Father    Colon cancer Maternal Uncle 75   Colon polyps Neg Hx    Esophageal cancer Neg Hx    Rectal cancer Neg Hx    Stomach cancer Neg Hx     ALLERGIES:  is allergic to porcine (pork) protein-containing drug products, shellfish allergy, lovastatin, and  penicillins.  MEDICATIONS:  Current Outpatient Medications  Medication Sig Dispense Refill   acetaminophen  (TYLENOL ) 650 MG CR tablet Take 650 mg by mouth every 8 (eight) hours as needed for pain.     aspirin  EC (ASPIRIN  ADULT LOW DOSE) 81 MG tablet Take 1 tablet (81 mg total) by mouth 2 (two) times daily. Swallow whole.     Cholecalciferol  (VITAMIN D ) 125 MCG (5000 UT) CAPS Take 5,000 Units by mouth daily with supper.     ezetimibe (ZETIA) 10 MG tablet Take 10 mg by mouth daily with supper.     famotidine (PEPCID) 20 MG tablet Take 20 mg by mouth at bedtime.     fluticasone  (FLONASE ) 50 MCG/ACT nasal spray Place 1-2 sprays into both nostrils daily as needed for allergies or rhinitis.     gabapentin  (NEURONTIN ) 300 MG capsule Take 600 mg by mouth at bedtime.     lisinopril -hydrochlorothiazide  (ZESTORETIC ) 20-25 MG tablet Take 1 tablet by mouth in the morning.     metFORMIN (GLUCOPHAGE-XR) 500 MG 24 hr tablet Take 500 mg by mouth daily with supper.     oxyCODONE  (ROXICODONE ) 5 MG immediate release tablet Take 1 tablet (5 mg total) by mouth every 4 (four) hours as needed. 30 tablet 0   pantoprazole  (PROTONIX ) 40 MG tablet Take 40 mg by mouth in the morning.     rosuvastatin  (CRESTOR ) 5 MG tablet Take 5 mg by mouth daily with supper.     tiZANidine  (ZANAFLEX ) 4 MG tablet Take 1 tablet (4 mg total) by mouth every 8 (eight) hours as needed for muscle spasms. 30 tablet 1   No current facility-administered medications for this visit.    REVIEW OF SYSTEMS:   Constitutional: ( - ) fevers, ( - )  chills , ( - ) night sweats Eyes: ( - ) blurriness of vision, ( - ) double vision, ( - ) watery eyes Ears, nose, mouth, throat, and face: ( - ) mucositis, ( - ) sore throat Respiratory: ( - ) cough, ( - ) dyspnea, ( - ) wheezes Cardiovascular: ( - ) palpitation, ( - ) chest discomfort, ( - )  lower extremity swelling Gastrointestinal:  ( - ) nausea, ( - ) heartburn, ( - ) change in bowel habits Skin: ( - )  abnormal skin rashes Lymphatics: ( - ) new lymphadenopathy, ( - ) easy bruising Neurological: ( - ) numbness, ( - ) tingling, ( - ) new weaknesses Behavioral/Psych: ( - ) mood change, ( - ) new changes  All other systems were reviewed with the patient and are negative.  PHYSICAL EXAMINATION: ECOG PERFORMANCE STATUS: 1 - Symptomatic but completely ambulatory  Vitals:   11/29/23 0853 11/29/23 0905  BP: (!) 143/71 130/68  Pulse: 86   Resp: 14   Temp: 98 F (36.7 C)   SpO2: 100%    Filed Weights   11/29/23 0853  Weight: 241 lb 9.6 oz (109.6 kg)    GENERAL: Well-appearing elderly Caucasian male, alert, no distress and comfortable SKIN: skin color, texture, turgor are normal, no rashes or significant lesions EYES: conjunctiva are pink and non-injected, sclera clear LUNGS: clear to auscultation and percussion with normal breathing effort HEART: regular rate & rhythm and no murmurs and no lower extremity edema Musculoskeletal: no cyanosis of digits and no clubbing  PSYCH: alert & oriented x 3, fluent speech NEURO: no focal motor/sensory deficits  LABORATORY DATA:  I have reviewed the data as listed    Latest Ref Rng & Units 11/29/2023    9:26 AM 11/22/2023    8:53 AM 06/15/2023    9:40 AM  CBC  WBC 4.0 - 10.5 K/uL 2.6  2.6  3.2   Hemoglobin 13.0 - 17.0 g/dL 89.5  89.9  89.1   Hematocrit 39.0 - 52.0 % 31.3  29.8  33.8   Platelets 150 - 400 K/uL 122  136  134        Latest Ref Rng & Units 11/22/2023    8:53 AM 06/15/2023    9:40 AM 05/31/2023    8:45 AM  CMP  Glucose 70 - 99 mg/dL 856  867  808   BUN 8 - 23 mg/dL 34  34  30   Creatinine 0.61 - 1.24 mg/dL 8.84  8.71  8.82   Sodium 135 - 145 mmol/L 136  135  135   Potassium 3.5 - 5.1 mmol/L 4.5  4.5  4.1   Chloride 98 - 111 mmol/L 106  105  104   CO2 22 - 32 mmol/L 25  25  25    Calcium  8.9 - 10.3 mg/dL 9.7  9.3  9.7   Total Protein 6.5 - 8.1 g/dL 8.8   8.8   Total Bilirubin 0.0 - 1.2 mg/dL 0.6   0.8   Alkaline Phos 38 -  126 U/L 71   72   AST 15 - 41 U/L 35   31   ALT 0 - 44 U/L 22   21     Lab Results  Component Value Date   MPROTEIN 2.7 (H) 11/22/2023   MPROTEIN 2.3 (H) 05/31/2023   MPROTEIN 2.3 (H) 11/22/2022   Lab Results  Component Value Date   KPAFRELGTCHN 112.5 (H) 11/22/2023   KPAFRELGTCHN 67.6 (H) 05/31/2023   KPAFRELGTCHN 92.8 (H) 11/22/2022   LAMBDASER 10.5 11/22/2023   LAMBDASER 9.3 05/31/2023   LAMBDASER 8.7 11/22/2022   KAPLAMBRATIO 10.71 (H) 11/22/2023   KAPLAMBRATIO 7.27 (H) 05/31/2023   KAPLAMBRATIO 10.67 (H) 11/22/2022   RADIOGRAPHIC STUDIES: No results found.  ASSESSMENT & PLAN Elsie LITTIE Nicholaus Mickey. 73 y.o. male with medical history significant for IgG kappa monoclonal gammopathy  of undetermined significance who presents for a follow up visit.   Monoclonal Gammopathies are a group of medical conditions defined by the presence of a monoclonal protein (an M protein) in the blood or urine. Monoclonal gammopathies include monoclonal gammopathy of unknown significance (MGUS), Monoclonal gammopathies of renal or neurological significance,  smoldering multiple myeloma (SMM), multiple myeloma (MM), AL amyloidosis, and Waldenstrom macroglobulinemia. The goal of the initial workup is to determine which monoclonal gammopathy a patient has. The workup consists of evaluating protein in the serum (with serum protein electrophoresis (SPEP) and serum free light chains) , evaluating protein in the urine (UPEP), and evaluation of the skeleton (DG Bone Met Survey) to assure no lytic lesions. Baseline bloodwork includes CMP and CBC. If no CRAB criteria or high risk criteria are noted then the diagnosis is MGUS. MGUS must be followed with bloodwork periodically to assure it does not convert to multiple myeloma (occurs to approximately 1% of patients per year). If there are CRAB criteria or high risk features (such as elevated serum free light chain ratio (taking into account renal function), a non IgG M  protein, or M protein >1.5) then a bone marrow biopsy must be pursued.    #IgG Kappa Monoclonal Gammopathy of Undetermined Significance -- Prior bone marrow biopsy performed on 04/02/2018 which confirmed diagnosis of MGUS with 9% plasma cells --Steady increase in M protein since time of diagnosis with mild increase in serum free light chains --Patient has mild anemia which is also been stable with normal kidney function. Plan: --at each visit will order an SPEP, UPEP, SFLC and beta 2 microglobulin --additionally will collect new baseline CBC, CMP, and LDH --recommend a metastatic bone survey to assess for lytic lesions every 1-2 years.  --will consider the need for a repeat bone marrow biopsy pending the above results --Labs showed kappa 112.5, lambda 10.5, ratio 10.71, M protein 2.7 --Labs show white blood cell count 2.6, Hgb 10.0, MCV 91.7, Plt 136 --RTC in 12 months time with 6 month labs   # Normocytic Anemia -- Will order full nutritional studies to include iron panel, ferritin, vitamin B12, folate, and methylmalonic acid. -- Will order hemolysis labs with LDH and reticulocytes.  If evidence of hemolysis we will also order haptoglobin and DAT -- MGUS appears stable, though progression to MM is a possibility.  -- If no clear etiology can be found we will need to pursue a repeat bone marrow biopsy. -- Return to clinic pending the results of the above studies.   Orders Placed This Encounter  Procedures   CBC with Differential (Cancer Center Only)    Standing Status:   Future    Number of Occurrences:   1    Expiration Date:   11/28/2024   Retic Panel    Standing Status:   Future    Number of Occurrences:   1    Expiration Date:   11/28/2024   Ferritin    Standing Status:   Future    Number of Occurrences:   1    Expiration Date:   11/28/2024   Iron and Iron Binding Capacity ([facility] only)    Standing Status:   Future    Number of Occurrences:   1    Expiration Date:    11/28/2024   Vitamin B12    Standing Status:   Future    Number of Occurrences:   1    Expiration Date:   11/28/2024   Methylmalonic acid, serum  Standing Status:   Future    Number of Occurrences:   1    Expiration Date:   11/28/2024   Folate, Serum    Standing Status:   Future    Number of Occurrences:   1    Expiration Date:   11/28/2024   All questions were answered. The patient knows to call the clinic with any problems, questions or concerns.  A total of more than 25 minutes were spent on this encounter with face-to-face time and non-face-to-face time, including preparing to see the patient, ordering tests and/or medications, counseling the patient and coordination of care as outlined above.   Norleen IVAR Kidney, MD Department of Hematology/Oncology [facility] at [facility] Phone: [phone] Pager: [phone] Email: [email or web address]  11/29/2023 4:56 PM

## 2023-11-30 DIAGNOSIS — M4316 Spondylolisthesis, lumbar region: Secondary | ICD-10-CM | POA: Diagnosis not present

## 2023-12-02 LAB — METHYLMALONIC ACID, SERUM: Methylmalonic Acid, Quantitative: 216 nmol/L (ref 0–378)

## 2023-12-11 DIAGNOSIS — M4316 Spondylolisthesis, lumbar region: Secondary | ICD-10-CM | POA: Diagnosis not present

## 2023-12-13 DIAGNOSIS — M4316 Spondylolisthesis, lumbar region: Secondary | ICD-10-CM | POA: Diagnosis not present

## 2023-12-20 DIAGNOSIS — M4316 Spondylolisthesis, lumbar region: Secondary | ICD-10-CM | POA: Diagnosis not present

## 2023-12-25 DIAGNOSIS — M4316 Spondylolisthesis, lumbar region: Secondary | ICD-10-CM | POA: Diagnosis not present

## 2024-05-22 ENCOUNTER — Inpatient Hospital Stay

## 2024-05-29 ENCOUNTER — Inpatient Hospital Stay: Admitting: Hematology and Oncology
